# Patient Record
Sex: Female | Born: 1960 | Race: White | Hispanic: No | Marital: Married | Smoking: Never smoker
Health system: Southern US, Community
[De-identification: ages and names within clinical notes are randomized; demographics above are authoritative.]

## PROBLEM LIST (undated history)

## (undated) DIAGNOSIS — M810 Age-related osteoporosis without current pathological fracture: Secondary | ICD-10-CM

## (undated) DIAGNOSIS — IMO0002 Reserved for concepts with insufficient information to code with codable children: Secondary | ICD-10-CM

## (undated) DIAGNOSIS — E7800 Pure hypercholesterolemia, unspecified: Secondary | ICD-10-CM

## (undated) DIAGNOSIS — F429 Obsessive-compulsive disorder, unspecified: Secondary | ICD-10-CM

## (undated) DIAGNOSIS — G43909 Migraine, unspecified, not intractable, without status migrainosus: Secondary | ICD-10-CM

## (undated) DIAGNOSIS — E039 Hypothyroidism, unspecified: Secondary | ICD-10-CM

## (undated) DIAGNOSIS — K068 Other specified disorders of gingiva and edentulous alveolar ridge: Secondary | ICD-10-CM

## (undated) HISTORY — DX: Migraine, unspecified, not intractable, without status migrainosus: G43.909

## (undated) HISTORY — DX: Obsessive-compulsive disorder, unspecified: F42.9

## (undated) HISTORY — PX: BREAST BIOPSY: SHX20

## (undated) HISTORY — DX: Other specified disorders of gingiva and edentulous alveolar ridge: K06.8

## (undated) HISTORY — DX: Age-related osteoporosis without current pathological fracture: M81.0

## (undated) HISTORY — DX: Pure hypercholesterolemia, unspecified: E78.00

## (undated) HISTORY — DX: Hypothyroidism, unspecified: E03.9

## (undated) HISTORY — DX: Reserved for concepts with insufficient information to code with codable children: IMO0002

## (undated) HISTORY — PX: OTHER SURGICAL HISTORY: SHX169

---

## 1999-11-12 HISTORY — PX: BREAST LUMPECTOMY: SHX2

## 2000-10-21 ENCOUNTER — Other Ambulatory Visit: Admission: RE | Admit: 2000-10-21 | Discharge: 2000-10-21 | Payer: Self-pay | Admitting: Obstetrics and Gynecology

## 2001-10-27 ENCOUNTER — Other Ambulatory Visit: Admission: RE | Admit: 2001-10-27 | Discharge: 2001-10-27 | Payer: Self-pay | Admitting: Radiology

## 2001-11-19 ENCOUNTER — Ambulatory Visit (HOSPITAL_COMMUNITY): Admission: RE | Admit: 2001-11-19 | Discharge: 2001-11-19 | Payer: Self-pay | Admitting: Surgery

## 2001-11-19 ENCOUNTER — Encounter (INDEPENDENT_AMBULATORY_CARE_PROVIDER_SITE_OTHER): Payer: Self-pay | Admitting: *Deleted

## 2002-11-01 ENCOUNTER — Other Ambulatory Visit: Admission: RE | Admit: 2002-11-01 | Discharge: 2002-11-01 | Payer: Self-pay | Admitting: Obstetrics and Gynecology

## 2003-11-08 ENCOUNTER — Other Ambulatory Visit: Admission: RE | Admit: 2003-11-08 | Discharge: 2003-11-08 | Payer: Self-pay | Admitting: Obstetrics and Gynecology

## 2003-11-12 HISTORY — PX: CATARACT EXTRACTION: SUR2

## 2004-10-15 ENCOUNTER — Other Ambulatory Visit: Admission: RE | Admit: 2004-10-15 | Discharge: 2004-10-15 | Payer: Self-pay | Admitting: Obstetrics and Gynecology

## 2005-10-18 ENCOUNTER — Other Ambulatory Visit: Admission: RE | Admit: 2005-10-18 | Discharge: 2005-10-18 | Payer: Self-pay | Admitting: Obstetrics and Gynecology

## 2006-10-20 ENCOUNTER — Other Ambulatory Visit: Admission: RE | Admit: 2006-10-20 | Discharge: 2006-10-20 | Payer: Self-pay | Admitting: Obstetrics and Gynecology

## 2007-10-23 ENCOUNTER — Other Ambulatory Visit: Admission: RE | Admit: 2007-10-23 | Discharge: 2007-10-23 | Payer: Self-pay | Admitting: Obstetrics and Gynecology

## 2008-10-21 ENCOUNTER — Other Ambulatory Visit: Admission: RE | Admit: 2008-10-21 | Discharge: 2008-10-21 | Payer: Self-pay | Admitting: Gynecology

## 2008-10-21 ENCOUNTER — Encounter: Payer: Self-pay | Admitting: Gynecology

## 2008-10-21 ENCOUNTER — Ambulatory Visit: Payer: Self-pay | Admitting: Gynecology

## 2008-11-11 DIAGNOSIS — IMO0002 Reserved for concepts with insufficient information to code with codable children: Secondary | ICD-10-CM

## 2008-11-11 HISTORY — DX: Reserved for concepts with insufficient information to code with codable children: IMO0002

## 2009-04-24 ENCOUNTER — Other Ambulatory Visit: Admission: RE | Admit: 2009-04-24 | Discharge: 2009-04-24 | Payer: Self-pay | Admitting: Gynecology

## 2009-04-24 ENCOUNTER — Ambulatory Visit: Payer: Self-pay | Admitting: Gynecology

## 2009-04-24 ENCOUNTER — Encounter: Payer: Self-pay | Admitting: Gynecology

## 2009-04-28 ENCOUNTER — Ambulatory Visit: Payer: Self-pay | Admitting: Gynecology

## 2009-06-20 ENCOUNTER — Ambulatory Visit: Payer: Self-pay | Admitting: Gynecology

## 2009-10-23 ENCOUNTER — Ambulatory Visit: Payer: Self-pay | Admitting: Gynecology

## 2009-10-23 ENCOUNTER — Other Ambulatory Visit: Admission: RE | Admit: 2009-10-23 | Discharge: 2009-10-23 | Payer: Self-pay | Admitting: Gynecology

## 2009-11-07 ENCOUNTER — Ambulatory Visit: Payer: Self-pay | Admitting: Gynecology

## 2009-11-21 ENCOUNTER — Other Ambulatory Visit: Admission: RE | Admit: 2009-11-21 | Discharge: 2009-11-21 | Payer: Self-pay | Admitting: Gynecologic Oncology

## 2009-11-21 ENCOUNTER — Ambulatory Visit: Admission: RE | Admit: 2009-11-21 | Discharge: 2009-11-21 | Payer: Self-pay | Admitting: Gynecologic Oncology

## 2010-02-07 ENCOUNTER — Other Ambulatory Visit: Admission: RE | Admit: 2010-02-07 | Discharge: 2010-02-07 | Payer: Self-pay | Admitting: Gynecology

## 2010-02-07 ENCOUNTER — Ambulatory Visit: Payer: Self-pay | Admitting: Gynecology

## 2010-10-24 ENCOUNTER — Ambulatory Visit: Payer: Self-pay | Admitting: Gynecology

## 2010-10-24 ENCOUNTER — Other Ambulatory Visit
Admission: RE | Admit: 2010-10-24 | Discharge: 2010-10-24 | Payer: Self-pay | Source: Home / Self Care | Admitting: Gynecology

## 2011-01-21 ENCOUNTER — Other Ambulatory Visit (INDEPENDENT_AMBULATORY_CARE_PROVIDER_SITE_OTHER): Payer: BC Managed Care – PPO

## 2011-01-21 DIAGNOSIS — E559 Vitamin D deficiency, unspecified: Secondary | ICD-10-CM

## 2011-02-08 ENCOUNTER — Other Ambulatory Visit: Payer: Self-pay

## 2011-03-29 NOTE — Op Note (Signed)
[place]  Patient:    Ashlee Chavez, Ashlee Chavez Visit Number: [id number] MRN: [id number]          Service Type: DSU Location: [facility] 2899 [id number] Attending Physician:  Andre Lefort Dictated by:   Sandria Bales. Ezzard Standing, M.D. Proc. Date: 11/19/01 Admit Date:  11/19/2001   CC:         Ashlee Maduro A. Eliezer Lofts., M.D.  Ashlee Chavez, M.D.   Operative Report  DATE OF BIRTH:  September 25, 1961.  PREOPERATIVE DIAGNOSIS:  Mass, right breast.  POSTOPERATIVE DIAGNOSIS:  Mass, right breast, final pathology pending.  PROCEDURE:  Right breast excisional biopsy.  SURGEON:  Sandria Bales. Ezzard Standing, M.D.  ANESTHESIA:  Approximately 20 cc of 0.25% Marcaine with MAC anesthesia.  COMPLICATIONS:  None.  INDICATION FOR PROCEDURE;  [name] is a 50 year old white female patient of Dr. Sylvester Harder and Dr. Elias Else, who has noticed a mass in her right breast.  She has had an aspiration of this once, when it essentially disappeared, but then reappeared fairly quickly.  A second aspiration showed papillary fragments consistent with possible papilloma.  She has a palpable mass at the 7 oclock position of the right breast, which by ultrasound is partly cystic and partly solid, and now comes for excision of this mass.  DESCRIPTION OF PROCEDURE:  The patient was taken to the operating room in a supine position.  I used an ultrasound at the initiation of the procedure to identify the mass, which was about 1.5 cm in greatest diameter and appears to have a partly cystic and partly solid component.  I marked the breast and painted it with Betadine solution and then used 1% Xylocaine with epinephrine as a local anesthetic.  I excised a block of breast tissue approximately 2.5 x 2.5 cm and got around the mass.  This was sent for permanent pathology.  There was no other palpable mass or abnormality within the breast.  I controlled bleeding with Bovie cautery and 3-0 Vicryl  sutures, closed the subcutaneous tissues with 3-0 Vicryl sutures and the skin with a 5-0 subcuticular Vicryl suture, painted with tincture of benzoin and Steri-Strips, and sterilely dressed.  The patient tolerated the procedure well, will see me back in seven to 10 days for follow-up.  She knows to call in about three days for final pathology. Dictated by:   Sandria Bales. Ezzard Standing, M.D. Attending Physician:  Andre Lefort DD:  11/19/01 TD:  11/19/01 Job: [id number]

## 2011-04-30 ENCOUNTER — Ambulatory Visit (INDEPENDENT_AMBULATORY_CARE_PROVIDER_SITE_OTHER): Payer: BC Managed Care – PPO | Admitting: Gynecology

## 2011-04-30 ENCOUNTER — Other Ambulatory Visit: Payer: Self-pay | Admitting: Gynecology

## 2011-04-30 ENCOUNTER — Other Ambulatory Visit (HOSPITAL_COMMUNITY)
Admission: RE | Admit: 2011-04-30 | Discharge: 2011-04-30 | Disposition: A | Discharge status: Home / Self Care | Payer: BC Managed Care – PPO | Source: Ambulatory Visit | Attending: Gynecology | Admitting: Gynecology

## 2011-04-30 DIAGNOSIS — N926 Irregular menstruation, unspecified: Secondary | ICD-10-CM

## 2011-04-30 DIAGNOSIS — R87619 Unspecified abnormal cytological findings in specimens from cervix uteri: Secondary | ICD-10-CM | POA: Insufficient documentation

## 2011-04-30 DIAGNOSIS — N870 Mild cervical dysplasia: Secondary | ICD-10-CM

## 2011-10-25 ENCOUNTER — Encounter: Payer: Self-pay | Admitting: Gynecology

## 2011-10-29 ENCOUNTER — Ambulatory Visit (INDEPENDENT_AMBULATORY_CARE_PROVIDER_SITE_OTHER): Payer: BC Managed Care – PPO | Admitting: Gynecology

## 2011-10-29 ENCOUNTER — Encounter: Payer: Self-pay | Admitting: Gynecology

## 2011-10-29 VITALS — BP 98/60 | Ht 64.0 in | Wt 120.0 lb

## 2011-10-29 DIAGNOSIS — Z01419 Encounter for gynecological examination (general) (routine) without abnormal findings: Secondary | ICD-10-CM

## 2011-10-29 DIAGNOSIS — R87612 Low grade squamous intraepithelial lesion on cytologic smear of cervix (LGSIL): Secondary | ICD-10-CM

## 2011-10-29 DIAGNOSIS — IMO0002 Reserved for concepts with insufficient information to code with codable children: Secondary | ICD-10-CM

## 2011-10-29 DIAGNOSIS — Z780 Asymptomatic menopausal state: Secondary | ICD-10-CM

## 2011-10-29 NOTE — Patient Instructions (Addendum)
Continued annual follow up with Pap smears.  Follow up for DEXA bone density as scheduled.

## 2011-10-29 NOTE — Progress Notes (Signed)
Ashlee Chavez 10/03/1961 [id number]        50 y.o.  for annual exam.  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 Was seen by Dr. Nelly Rout in consultation with recommended serial cytologies. Her dysplasia was December 2010 she has 3 follow up Pap smears which have been normal.  Past medical history,surgical history, medications, allergies, family history and social history were all reviewed and documented in the EPIC chart. ROS:  Was performed and pertinent positives and negatives are included in the history.  Exam: chaperone present Filed Vitals:   10/29/11 1536  BP: 98/60   General appearance  Normal Skin grossly normal Head/Neck normal with no cervical or supraclavicular adenopathy thyroid normal Lungs  clear Cardiac RR, without RMG Abdominal  soft, nontender, without masses, organomegaly or hernia Breasts  examined lying and sitting without masses, retractions, discharge or axillary adenopathy. Pelvic  Ext/BUS/vagina  normal with atrophic genital changes noted and constriction band upper vagina  Cervix  normal  To limited inspection. Pap done  Uterus  anteverted, normal size, shape and contour, midline and mobile nontender   Adnexa  Without masses or tenderness    Anus and perineum  normal   Rectovaginal  normal sphincter tone without palpated masses or tenderness. Old external hemorrhoid   Assessment/Plan:  50 y.o. female for annual exam.    1. 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 Pap done today we'll continued annual cytology. 2. Menopausal symptoms. Patient's having some mild night sweats not overly bothersome. I recommended OTC soy. If worsens then will represent for HRT discussion. Patient's not interested at this point. 3. Amenorrhea. Patient is going on one year without menses. Will report any further bleeding. 4. Bone health. We'll get baseline bone density now. She is  thin now postmenopausal we'll go and get baseline. Increase calcium vitamin D reviewed. 5. Breast health. SBE monthly reviewed. Patient had mammogram last week we'll continue with annual mammography. 6. Colonoscopy. Patient had colonoscopy this past year we'll follow up with the recommended interval. 7. Health maintenance. No blood work was done today as this is all done through her primary physician's office who she sees actively. Assuming she continues well from a gynecologic standpoint she will see [place] in a year sooner as needed.    Dara Lords MD, 4:11 PM 10/29/2011

## 2011-11-06 ENCOUNTER — Other Ambulatory Visit (HOSPITAL_COMMUNITY)
Admission: RE | Admit: 2011-11-06 | Discharge: 2011-11-06 | Disposition: A | Discharge status: Home / Self Care | Payer: BC Managed Care – PPO | Source: Ambulatory Visit | Attending: Gynecology | Admitting: Gynecology

## 2011-11-12 DIAGNOSIS — M8580 Other specified disorders of bone density and structure, unspecified site: Secondary | ICD-10-CM | POA: Insufficient documentation

## 2011-11-13 ENCOUNTER — Other Ambulatory Visit: Payer: Self-pay | Admitting: Endocrinology

## 2011-11-13 DIAGNOSIS — E049 Nontoxic goiter, unspecified: Secondary | ICD-10-CM

## 2011-11-20 ENCOUNTER — Ambulatory Visit
Admission: RE | Admit: 2011-11-20 | Discharge: 2011-11-20 | Disposition: A | Discharge status: Home / Self Care | Payer: BC Managed Care – PPO | Source: Ambulatory Visit | Attending: Endocrinology | Admitting: Endocrinology

## 2011-11-20 DIAGNOSIS — E049 Nontoxic goiter, unspecified: Secondary | ICD-10-CM

## 2011-12-05 ENCOUNTER — Encounter: Payer: Self-pay | Admitting: Gynecology

## 2011-12-05 ENCOUNTER — Telehealth: Payer: Self-pay | Admitting: Gynecology

## 2011-12-05 ENCOUNTER — Ambulatory Visit (INDEPENDENT_AMBULATORY_CARE_PROVIDER_SITE_OTHER): Payer: BC Managed Care – PPO

## 2011-12-05 DIAGNOSIS — M8580 Other specified disorders of bone density and structure, unspecified site: Secondary | ICD-10-CM

## 2011-12-05 DIAGNOSIS — M899 Disorder of bone, unspecified: Secondary | ICD-10-CM

## 2011-12-05 DIAGNOSIS — Z780 Asymptomatic menopausal state: Secondary | ICD-10-CM

## 2011-12-05 NOTE — Telephone Encounter (Signed)
Left message for pt to call.

## 2011-12-05 NOTE — Telephone Encounter (Signed)
Tell patient that bone density shows osteopenia with some loss. Recommend maximizing calcium 1200 mg daily and vitamin D at 1000 units daily and we will recheck a bone density in 2 years.

## 2011-12-09 NOTE — Telephone Encounter (Signed)
Pt informed with the below note. 

## 2012-10-26 ENCOUNTER — Encounter: Payer: Self-pay | Admitting: Gynecology

## 2012-10-29 ENCOUNTER — Other Ambulatory Visit (HOSPITAL_COMMUNITY)
Admission: RE | Admit: 2012-10-29 | Discharge: 2012-10-29 | Disposition: A | Discharge status: Home / Self Care | Payer: BC Managed Care – PPO | Source: Ambulatory Visit | Attending: Gynecology | Admitting: Gynecology

## 2012-10-29 ENCOUNTER — Ambulatory Visit (INDEPENDENT_AMBULATORY_CARE_PROVIDER_SITE_OTHER): Payer: BC Managed Care – PPO | Admitting: Gynecology

## 2012-10-29 ENCOUNTER — Encounter: Payer: Self-pay | Admitting: Gynecology

## 2012-10-29 VITALS — BP 110/70 | Ht 63.0 in | Wt 114.0 lb

## 2012-10-29 DIAGNOSIS — Z01419 Encounter for gynecological examination (general) (routine) without abnormal findings: Secondary | ICD-10-CM | POA: Insufficient documentation

## 2012-10-29 DIAGNOSIS — M8580 Other specified disorders of bone density and structure, unspecified site: Secondary | ICD-10-CM

## 2012-10-29 DIAGNOSIS — F429 Obsessive-compulsive disorder, unspecified: Secondary | ICD-10-CM | POA: Insufficient documentation

## 2012-10-29 DIAGNOSIS — E039 Hypothyroidism, unspecified: Secondary | ICD-10-CM | POA: Insufficient documentation

## 2012-10-29 DIAGNOSIS — Z1151 Encounter for screening for human papillomavirus (HPV): Secondary | ICD-10-CM | POA: Insufficient documentation

## 2012-10-29 DIAGNOSIS — E7800 Pure hypercholesterolemia, unspecified: Secondary | ICD-10-CM | POA: Insufficient documentation

## 2012-10-29 DIAGNOSIS — M899 Disorder of bone, unspecified: Secondary | ICD-10-CM

## 2012-10-29 DIAGNOSIS — I10 Essential (primary) hypertension: Secondary | ICD-10-CM | POA: Insufficient documentation

## 2012-10-29 DIAGNOSIS — IMO0002 Reserved for concepts with insufficient information to code with codable children: Secondary | ICD-10-CM | POA: Insufficient documentation

## 2012-10-29 NOTE — Addendum Note (Signed)
Addended by: Dayna Barker on: 10/29/2012 12:47 PM   Modules accepted: Orders

## 2012-10-29 NOTE — Progress Notes (Signed)
ESSENCE MERLE 07-06-1961 [id number]        51 y.o.  G0P0 for annual exam.    Past medical history,surgical history, medications, allergies, family history and social history were all reviewed and documented in the EPIC chart. ROS:  Was performed and pertinent positives and negatives are included in the history.  Exam: Kim assistant Filed Vitals:   10/29/12 1123  BP: 110/70  Height: 5\' 3"  (1.6 m)  Weight: 114 lb (51.71 kg)   General appearance  Normal Skin grossly normal Head/Neck normal with no cervical or supraclavicular adenopathy thyroid normal Lungs  clear Cardiac RR, without RMG Abdominal  soft, nontender, without masses, organomegaly or hernia Breasts  examined lying and sitting without masses, retractions, discharge or axillary adenopathy. Pelvic  Ext/BUS/vagina atrophic with upper vaginal constriction ring before the cervix.  Cervix  normal Pap/HPV  Uterus  axial, normal size, shape and contour, midline and mobile nontender   Adnexa  Without masses or tenderness    Anus and perineum  normal   Rectovaginal  normal sphincter tone without palpated masses or tenderness.    Assessment/Plan:  51 y.o. G0P0 female for annual exam.   1. Postmenopausal. Patient without significant symptoms or any bleeding. We'll continue to monitor.  Knows to report any bleeding. 2. Pap smear 2012. Pap/HPV done today. Does have history of low-grade SIL 2010 with multiple normal Pap smears since.  Cervix is hard to visualize due to the constriction ring and she saw Dr. Laurette Schimke GYN oncologist in referral due to the low-grade abnormal Pap smear and difficulty with colposcopy. She recommended expected management as long as her Pap smears remain normal then we'll follow expectantly. 3. Mammography normal 3 days ago.  Continue with annual mammography. SBE monthly reviewed. 4. Osteopenia. DEXA on/2013 T score -1.9.  FRAX 5.5%/0.7%. Continue with calcium vitamin D supplementation and planned repeat  DEXA next year at two-year interval. 5. Colonoscopy 2012. Follow up at recommended interval. 6. Health maintenance. No blood work done as it is all done through her primary physician's office who she sees on a regular basis. Follow up one year, sooner as needed.    Dara Lords MD, 12:18 PM 10/29/2012

## 2012-10-29 NOTE — Patient Instructions (Signed)
Follow up for annual exam in one year 

## 2012-10-30 ENCOUNTER — Other Ambulatory Visit: Payer: Self-pay | Admitting: *Deleted

## 2012-10-30 DIAGNOSIS — R809 Proteinuria, unspecified: Secondary | ICD-10-CM

## 2012-10-30 LAB — URINALYSIS W MICROSCOPIC + REFLEX CULTURE
Bacteria, UA: NONE SEEN
Bilirubin Urine: NEGATIVE
Hgb urine dipstick: NEGATIVE
Ketones, ur: NEGATIVE mg/dL
Nitrite: NEGATIVE
Protein, ur: 100 mg/dL — AB
RBC / HPF: NONE SEEN RBC/hpf (ref <3)
Specific Gravity, Urine: 1.01 (ref 1.005–1.030)
Urobilinogen, UA: 0.2 mg/dL (ref 0.0–1.0)

## 2012-11-02 ENCOUNTER — Other Ambulatory Visit: Payer: BC Managed Care – PPO

## 2012-11-02 DIAGNOSIS — R809 Proteinuria, unspecified: Secondary | ICD-10-CM

## 2012-11-03 LAB — URINALYSIS W MICROSCOPIC + REFLEX CULTURE
Bacteria, UA: NONE SEEN
Casts: NONE SEEN
Crystals: NONE SEEN
Ketones, ur: NEGATIVE mg/dL
Leukocytes, UA: NEGATIVE
Nitrite: NEGATIVE
Specific Gravity, Urine: 1.005 (ref 1.005–1.030)
Urobilinogen, UA: 0.2 mg/dL (ref 0.0–1.0)
pH: 6 (ref 5.0–8.0)

## 2013-07-17 ENCOUNTER — Emergency Department (HOSPITAL_COMMUNITY): Payer: BC Managed Care – PPO

## 2013-07-17 ENCOUNTER — Emergency Department (HOSPITAL_COMMUNITY)
Admission: EM | Admit: 2013-07-17 | Discharge: 2013-07-17 | Disposition: A | Discharge status: Home / Self Care | Payer: BC Managed Care – PPO | Attending: Emergency Medicine | Admitting: Emergency Medicine

## 2013-07-17 DIAGNOSIS — Z79899 Other long term (current) drug therapy: Secondary | ICD-10-CM | POA: Insufficient documentation

## 2013-07-17 DIAGNOSIS — E039 Hypothyroidism, unspecified: Secondary | ICD-10-CM | POA: Insufficient documentation

## 2013-07-17 DIAGNOSIS — E7800 Pure hypercholesterolemia, unspecified: Secondary | ICD-10-CM | POA: Insufficient documentation

## 2013-07-17 DIAGNOSIS — Z8739 Personal history of other diseases of the musculoskeletal system and connective tissue: Secondary | ICD-10-CM | POA: Insufficient documentation

## 2013-07-17 DIAGNOSIS — G43909 Migraine, unspecified, not intractable, without status migrainosus: Secondary | ICD-10-CM | POA: Insufficient documentation

## 2013-07-17 DIAGNOSIS — F429 Obsessive-compulsive disorder, unspecified: Secondary | ICD-10-CM | POA: Insufficient documentation

## 2013-07-17 DIAGNOSIS — I10 Essential (primary) hypertension: Secondary | ICD-10-CM | POA: Insufficient documentation

## 2013-07-17 DIAGNOSIS — N201 Calculus of ureter: Secondary | ICD-10-CM | POA: Insufficient documentation

## 2013-07-17 DIAGNOSIS — R1110 Vomiting, unspecified: Secondary | ICD-10-CM | POA: Insufficient documentation

## 2013-07-17 LAB — URINALYSIS, ROUTINE W REFLEX MICROSCOPIC
Bilirubin Urine: NEGATIVE
Glucose, UA: NEGATIVE mg/dL
Ketones, ur: NEGATIVE mg/dL
Leukocytes, UA: NEGATIVE
Nitrite: NEGATIVE
Specific Gravity, Urine: 1.014 (ref 1.005–1.030)
pH: 7.5 (ref 5.0–8.0)

## 2013-07-17 LAB — URINE MICROSCOPIC-ADD ON

## 2013-07-17 LAB — POCT I-STAT, CHEM 8
Chloride: 109 mEq/L (ref 96–112)
HCT: 42 % (ref 36.0–46.0)
HCT: 41 % (ref 36.0–46.0)
Hemoglobin: 14.3 g/dL (ref 12.0–15.0)
Potassium: 3.9 mEq/L (ref 3.5–5.1)
Potassium: 3.8 mEq/L (ref 3.5–5.1)
Sodium: 143 mEq/L (ref 135–145)
Sodium: 144 mEq/L (ref 135–145)

## 2013-07-17 MED ORDER — KETOROLAC TROMETHAMINE 30 MG/ML IJ SOLN
30.0000 mg | Freq: Once | INTRAMUSCULAR | Status: DC
  Filled 2013-07-17: qty 1

## 2013-07-17 MED ORDER — OXYCODONE-ACETAMINOPHEN 5-325 MG PO TABS: 1.0000 | ORAL_TABLET | ORAL | Status: DC | PRN

## 2013-07-17 MED ORDER — TAMSULOSIN HCL 0.4 MG PO CAPS: 0.4000 mg | ORAL_CAPSULE | Freq: Every day | ORAL | Status: DC

## 2013-07-17 NOTE — ED Notes (Signed)
Pt unable to urinate at this time, instructed to notify when able.

## 2013-07-17 NOTE — ED Provider Notes (Signed)
CSN: [id number]     Arrival date & time 07/17/13  1123 History   First MD Initiated Contact with Patient 07/17/13 1123     Chief Complaint  Patient presents with  . Abdominal Pain  . Emesis   (Consider location/radiation/quality/duration/timing/severity/associated sxs/prior Treatment) HPI Comments: Pt states that she had acute onset of right flank pain radiating to her right lower abdomen:also having some vomiting:denies dysuria, vaginal discharge, diarrhea or fever;denies history of similar symptoms:pt states that the pain is better after the medication from ems  No language interpreter was used.    Past Medical History  Diagnosis Date  . LGSIL (low grade squamous intraepithelial dysplasia) 2010  . OCD (obsessive compulsive disorder)   . Migraines   . Hypercholesteremia   . Osteopenia 11/2011    T score -1.9  . Hypertension   . Hypothyroid    Past Surgical History  Procedure Laterality Date  . Cataract extraction  2005  . Breast lumpectomy  2001    right-benign   Family History  Problem Relation Age of Onset  . Hypertension Mother   . Heart disease Mother   . Hypertension Father   . Breast cancer Paternal Aunt     Age 81   History  Substance Use Topics  . Smoking status: Never Smoker   . Smokeless tobacco: Never Used  . Alcohol Use: Yes     Comment: wine occassionally   OB History   Grav Para Term Preterm Abortions TAB SAB Ect Mult Living   0              Review of Systems  Constitutional: Negative.   Respiratory: Negative.   Genitourinary: Positive for flank pain. Negative for dysuria.    Allergies  Ampicillin  Home Medications   Current Outpatient Rx  Name  Route  Sig  Dispense  Refill  . ALPRAZolam (XANAX) 0.25 MG tablet   Oral   Take 0.25 mg by mouth at bedtime as needed.         . Ascorbic Acid (VITAMIN C PO)   Oral   Take by mouth.           Marland Kitchen atorvastatin (LIPITOR) 20 MG tablet   Oral   Take 20 mg by mouth daily.           .  Calcium Carbonate-Vitamin D (CALCIUM + D PO)   Oral   Take by mouth.           . Cholecalciferol (VITAMIN D PO)   Oral   Take by mouth.         . Cyanocobalamin (VITAMIN B-12 PO)   Oral   Take by mouth.         . levothyroxine (SYNTHROID, LEVOTHROID) 75 MCG tablet   Oral   Take 75 mcg by mouth daily. Skip on Sunday           . PARoxetine (PAXIL) 20 MG tablet   Oral   Take 80 mg by mouth at bedtime.          . topiramate (TOPAMAX) 100 MG tablet   Oral   Take 100 mg by mouth daily. 150 mg.daily          BP 129/85  Pulse 83  Temp(Src) 98.2 F (36.8 C)  Resp 22  Ht 5\' 4"  (1.626 m)  Wt 115 lb (52.164 kg)  BMI 19.73 kg/m2  SpO2 98%  LMP 11/19/2010 Physical Exam  Nursing note and vitals reviewed. Constitutional: She is  oriented to person, place, and time. She appears well-developed and well-nourished. She appears distressed.  HENT:  Head: Normocephalic and atraumatic.  Eyes: Conjunctivae and EOM are normal.  Neck: Neck supple.  Cardiovascular: Normal rate and regular rhythm.   Pulmonary/Chest: Effort normal and breath sounds normal.  Abdominal: Soft. Bowel sounds are normal. There is no tenderness.  Musculoskeletal: Normal range of motion.  Neurological: She is alert and oriented to person, place, and time.  Skin: Skin is warm and dry.  Psychiatric: She has a normal mood and affect.    ED Course  Procedures (including critical care time) Labs Review Labs Reviewed  URINALYSIS, ROUTINE W REFLEX MICROSCOPIC - Abnormal; Notable for the following:    APPearance CLOUDY (*)    Hgb urine dipstick LARGE (*)    All other components within normal limits  URINE MICROSCOPIC-ADD ON - Abnormal; Notable for the following:    Bacteria, UA FEW (*)    All other components within normal limits   Imaging Review Ct Abdomen Pelvis Wo Contrast  07/17/2013   *RADIOLOGY REPORT*  Clinical Data: Right-sided abdominal pain with nausea and vomiting. Hypertension.  Migraines.   Right lumpectomy.  CT ABDOMEN AND PELVIS WITHOUT CONTRAST  Technique:  Multidetector CT imaging of the abdomen and pelvis was performed following the standard protocol without intravenous contrast.  Comparison: None.  Findings: Lung bases:  Motion degradation at the lung bases.  Mild left base scarring.  Normal heart size.  Trace bilateral pleural effusions.  Abdomen/pelvis:  Normal uninfused appearance of the liver, spleen, stomach, pancreas, gallbladder, biliary tract, adrenal glands.  Bilateral punctate renal collecting system calculi.  Mild to moderate right-sided urinary tract obstruction with hydroureter continuing to the level of a proximal to mid right ureteric calculus which measures 4 mm on image 44/series 2.  No distal ureteric stone.  Advanced aortic atherosclerosis. No retroperitoneal or retrocrural adenopathy.  Normal colon and terminal ileum.  Appendix likely identified on image 49/series 2. Normal small bowel without abdominal ascites.  No pelvic adenopathy.  A punctate calcification along the posterior wall of the bladder on image 73/series 2.  Normal uterus and adnexa, without significant free pelvic fluid.  Bones/Musculoskeletal:  No acute osseous abnormality.  IMPRESSION:  1.  Mild to moderate right-sided urinary tract obstruction secondary to a proximal to mid right ureteric calculus. 2.  Bilateral nephrolithiasis. 3.  Faint calcification along the posterior wall of the urinary bladder.  Suspicious for a tiny bladder stone.  Dystrophic calcification associated with anterior wall of the vagina could look similar. 4.  Trace bilateral pleural effusions.   Original Report Authenticated By: Jeronimo Greaves, M.D.    MDM   1. Ureteral stone    Pt is comfortable at this time will send home with oxycodone and flomax:pt given urology follow up as no history of stone    Teressa Lower, NP 07/17/13 1452

## 2013-07-17 NOTE — ED Notes (Signed)
Pt presents with lower right sided back pain that radiates around to abd with nausea and vomitting. Pt was given 4mf of zofran and of fentayl

## 2013-07-17 NOTE — ED Provider Notes (Signed)
Medical screening examination/treatment/procedure(s) were performed by non-physician practitioner and as supervising physician I was immediately available for consultation/collaboration.   Shelda Jakes, MD 07/17/13 1500

## 2013-09-21 ENCOUNTER — Other Ambulatory Visit (INDEPENDENT_AMBULATORY_CARE_PROVIDER_SITE_OTHER): Payer: BC Managed Care – PPO

## 2013-09-21 ENCOUNTER — Other Ambulatory Visit: Payer: Self-pay | Admitting: Endocrinology

## 2013-09-21 ENCOUNTER — Other Ambulatory Visit: Payer: Self-pay | Admitting: *Deleted

## 2013-09-21 DIAGNOSIS — E039 Hypothyroidism, unspecified: Secondary | ICD-10-CM

## 2013-09-21 DIAGNOSIS — E7800 Pure hypercholesterolemia, unspecified: Secondary | ICD-10-CM

## 2013-09-21 LAB — COMPREHENSIVE METABOLIC PANEL
Alkaline Phosphatase: 48 U/L (ref 39–117)
BUN: 14 mg/dL (ref 6–23)
Calcium: 9.8 mg/dL (ref 8.4–10.5)
GFR: 68.49 mL/min (ref >60.00)
Potassium: 4.1 mEq/L (ref 3.5–5.1)
Sodium: 139 mEq/L (ref 135–145)

## 2013-09-21 LAB — LIPID PANEL
Cholesterol: 172 mg/dL (ref 0–200)
HDL: 75.6 mg/dL (ref >39.00)
VLDL: 15.6 mg/dL (ref 0.0–40.0)

## 2013-09-21 LAB — TSH: TSH: 3.06 u[IU]/mL (ref 0.35–5.50)

## 2013-09-21 LAB — T4, FREE: Free T4: 0.7 ng/dL (ref 0.60–1.60)

## 2013-09-23 ENCOUNTER — Encounter: Payer: Self-pay | Admitting: Endocrinology

## 2013-09-23 ENCOUNTER — Ambulatory Visit (INDEPENDENT_AMBULATORY_CARE_PROVIDER_SITE_OTHER): Payer: BC Managed Care – PPO | Admitting: Endocrinology

## 2013-09-23 VITALS — BP 120/76 | HR 111 | Temp 98.2°F | Resp 12 | Ht 64.0 in | Wt 120.0 lb

## 2013-09-23 DIAGNOSIS — E039 Hypothyroidism, unspecified: Secondary | ICD-10-CM

## 2013-09-23 DIAGNOSIS — E7800 Pure hypercholesterolemia, unspecified: Secondary | ICD-10-CM

## 2013-09-23 NOTE — Progress Notes (Signed)
Reason for Appointment:  Hypothyroidism, followup visit    History of Present Illness:   The hypothyroidism was first diagnosed in ? 1996  She had a goiter diagnosed in about 58.  She had a positive peroxidase antibody and has been on thyroxine since 1996 Her TSH was high in 2006 and she had symptoms of fatigue, cold intolerance and some hair loss Since then has been on 75 mcg of Synthroid Has generally been on a stable dose of thyroxine  Complaints are reported by the patient now are  None, has chronic cold sensitivity                 Compliance with the medical regimen has been  good with taking the tablet in the morning before breakfast.  Labs:  Appointment on 09/21/2013  Component Date Value Range Status  . TSH 09/21/2013 3.06  0.35 - 5.50 uIU/mL Final  . Free T4 09/21/2013 0.70  0.60 - 1.60 ng/dL Final  . Sodium 16/08/9603 139  135 - 145 mEq/L Final  . Potassium 09/21/2013 4.1  3.5 - 5.1 mEq/L Final  . Chloride 09/21/2013 106  96 - 112 mEq/L Final  . CO2 09/21/2013 25  19 - 32 mEq/L Final  . Glucose, Bld 09/21/2013 90  70 - 99 mg/dL Final  . BUN 54/07/8118 14  6 - 23 mg/dL Final  . Creatinine, Ser 09/21/2013 0.9  0.4 - 1.2 mg/dL Final  . Total Bilirubin 09/21/2013 0.6  0.3 - 1.2 mg/dL Final  . Alkaline Phosphatase 09/21/2013 48  39 - 117 U/L Final  . AST 09/21/2013 18  0 - 37 U/L Final  . ALT 09/21/2013 16  0 - 35 U/L Final  . Total Protein 09/21/2013 7.0  6.0 - 8.3 g/dL Final  . Albumin 14/78/2956 4.1  3.5 - 5.2 g/dL Final  . Calcium 21/30/8657 9.8  8.4 - 10.5 mg/dL Final  . GFR 84/69/6295 68.49  >60.00 mL/min Final  . Cholesterol 09/21/2013 172  0 - 200 mg/dL Final   ATP III Classification       Desirable:  < 200 mg/dL               Borderline High:  200 - 239 mg/dL          High:  > = 284 mg/dL  . Triglycerides 09/21/2013 78.0  0.0 - 149.0 mg/dL Final   Normal:  <132 mg/dLBorderline High:  150 - 199 mg/dL  . HDL 09/21/2013 75.60  >39.00 mg/dL Final  . VLDL  44/11/270 15.6  0.0 - 40.0 mg/dL Final  . LDL Cholesterol 09/21/2013 81  0 - 99 mg/dL Final  . Total CHOL/HDL Ratio 09/21/2013 2   Final                  Men          Women1/2 Average Risk     3.4          3.3Average Risk          5.0          4.42X Average Risk          9.6          7.13X Average Risk          15.0          11.0                          Medication  List       This list is accurate as of: 09/23/13  4:23 PM.  Always use your most recent med list.               ABILIFY 2 MG tablet  Generic drug:  ARIPiprazole  Take 1 mg by mouth daily.     ALPRAZolam 0.25 MG tablet  Commonly known as:  XANAX  Take 0.25 mg by mouth daily as needed for anxiety.     atorvastatin 20 MG tablet  Commonly known as:  LIPITOR  Take 20 mg by mouth daily.     Calcium-Vitamin D 600-200 MG-UNIT Caps  Take 2 tablets by mouth daily.     clonazePAM 0.5 MG tablet  Commonly known as:  KLONOPIN     levothyroxine 75 MCG tablet  Commonly known as:  SYNTHROID, LEVOTHROID  Take 75 mcg by mouth daily. Skip on Sunday     oxyCODONE-acetaminophen 5-325 MG per tablet  Commonly known as:  PERCOCET/ROXICET  Take 1-2 tablets by mouth every 4 (four) hours as needed for pain.     PARoxetine 30 MG tablet  Commonly known as:  PAXIL  Take 30 mg by mouth 2 (two) times daily.     ranitidine 75 MG tablet  Commonly known as:  ZANTAC  Take 75 mg by mouth daily as needed for heartburn.     SUMAtriptan 100 MG tablet  Commonly known as:  IMITREX  Take 100 mg by mouth daily as needed for migraine.     tamsulosin 0.4 MG Caps capsule  Commonly known as:  FLOMAX  Take 1 capsule (0.4 mg total) by mouth daily.     topiramate 100 MG tablet  Commonly known as:  TOPAMAX  - Take 50-100 mg by mouth 2 (two) times daily. 50 mg in the morning  - 100 mg at night     vitamin B-12 1000 MCG tablet  Commonly known as:  CYANOCOBALAMIN  Take 1,000 mcg by mouth daily.     vitamin C 500 MG tablet  Commonly known as:   ASCORBIC ACID  Take 500 mg by mouth daily.     zonisamide 50 MG capsule  Commonly known as:  ZONEGRAN        Allergies:  Allergies  Allergen Reactions  . Ampicillin Rash    Past Medical History  Diagnosis Date  . LGSIL (low grade squamous intraepithelial dysplasia) 2010  . OCD (obsessive compulsive disorder)   . Migraines   . Hypercholesteremia   . Osteopenia 11/2011    T score -1.9  . Hypertension   . Hypothyroid     Past Surgical History  Procedure Laterality Date  . Cataract extraction  2005  . Breast lumpectomy  2001    right-benign    Family History  Problem Relation Age of Onset  . Hypertension Mother   . Heart disease Mother   . Hypertension Father   . Breast cancer Paternal Aunt     Age 32    Social History:  reports that she has never smoked. She has never used smokeless tobacco. She reports that she drinks alcohol. She reports that she does not use illicit drugs.  REVIEW Of SYSTEMS:  She has had hypercholesterolemia and also family history of CAD. Has been on Lipitor 20 mg for several years with no side effects  She sees her gynecologist regularly   Examination:   BP 120/76  Pulse 111  Temp(Src) 98.2 F (36.8 C)  Resp 12  Ht 5\' 4"  (1.626 m)  Wt 120 lb (54.432 kg)  BMI 20.59 kg/m2  SpO2 97%  LMP 11/19/2010    GENERAL APPEARANCE: Alert And looks well.    FACE: No puffiness of face or periorbital edema.         NECK:  right lobe of thyroid is just palpable, smooth and slightly firm         NEUROLOGIC EXAM:  biceps reflexes somewhat difficult to elicit but appear to be normal    Assessments   Hypothyroidism with history of goiter, now with stable TSH levels and clinically asymptomatic    Treatment:   Continue same dosage before breakfast daily. Reminded her to avoid taking any calcium or iron supplements with the thyroid supplement.   Followup in one year  Discussed importance of exercise especially with her symptoms of depression  and insomnia  Ashlee Chavez 09/23/2013, 4:23 PM

## 2013-10-05 ENCOUNTER — Telehealth: Payer: Self-pay | Admitting: Endocrinology

## 2013-10-05 ENCOUNTER — Other Ambulatory Visit: Payer: Self-pay | Admitting: *Deleted

## 2013-10-05 MED ORDER — LEVOTHYROXINE SODIUM 75 MCG PO TABS: 75.0000 ug | ORAL_TABLET | Freq: Every day | ORAL | Status: DC

## 2013-11-01 ENCOUNTER — Other Ambulatory Visit: Payer: Self-pay | Admitting: *Deleted

## 2013-11-01 MED ORDER — ATORVASTATIN CALCIUM 20 MG PO TABS: 20.0000 mg | ORAL_TABLET | Freq: Every day | ORAL | Status: DC

## 2013-11-01 MED ORDER — LEVOTHYROXINE SODIUM 75 MCG PO TABS: 75.0000 ug | ORAL_TABLET | Freq: Every day | ORAL | Status: DC

## 2013-11-08 ENCOUNTER — Encounter: Payer: Self-pay | Admitting: Gynecology

## 2013-11-16 ENCOUNTER — Ambulatory Visit (INDEPENDENT_AMBULATORY_CARE_PROVIDER_SITE_OTHER): Payer: BC Managed Care – PPO | Admitting: Gynecology

## 2013-11-16 ENCOUNTER — Encounter: Payer: Self-pay | Admitting: Gynecology

## 2013-11-16 VITALS — BP 116/74 | Ht 63.0 in | Wt 120.0 lb

## 2013-11-16 DIAGNOSIS — M949 Disorder of cartilage, unspecified: Secondary | ICD-10-CM

## 2013-11-16 DIAGNOSIS — M8580 Other specified disorders of bone density and structure, unspecified site: Secondary | ICD-10-CM

## 2013-11-16 DIAGNOSIS — M899 Disorder of bone, unspecified: Secondary | ICD-10-CM

## 2013-11-16 DIAGNOSIS — Z01419 Encounter for gynecological examination (general) (routine) without abnormal findings: Secondary | ICD-10-CM

## 2013-11-16 DIAGNOSIS — N952 Postmenopausal atrophic vaginitis: Secondary | ICD-10-CM

## 2013-11-16 NOTE — Patient Instructions (Signed)
Followup for bone density as scheduled, otherwise in one year for annual exam.

## 2013-11-16 NOTE — Progress Notes (Signed)
Ashlee HashimotoJanet C Chavez 10/27/1961 [id number]        53 y.o.  G0P0 for annual exam.  Doing well without complaints.  Past medical history,surgical history, problem list, medications, allergies, family history and social history were all reviewed and documented in the EPIC chart.  ROS:  Performed and pertinent positives and negatives are included in the history, assessment and plan .  Exam: Kim assistant Filed Vitals:   11/16/13 1549  BP: 116/74  Height: 5\' 3"  (1.6 m)  Weight: 120 lb (54.432 kg)   General appearance  Normal Skin grossly normal Head/Neck normal with no cervical or supraclavicular adenopathy thyroid normal Lungs  clear Cardiac RR, without RMG Abdominal  soft, nontender, without masses, organomegaly or hernia Breasts  examined lying and sitting without masses, retractions, discharge or axillary adenopathy. Pelvic  Ext/BUS/vagina  general atrophic changes with constriction band upper vagina before Cerebyx.  Cervix  normal and atrophic in appearance  Uterus  axial, normal size, shape and contour, midline and mobile nontender   Adnexa  Without masses or tenderness    Anus and perineum  Normal   Rectovaginal  Normal sphincter tone without palpated masses or tenderness.    Assessment/Plan:  53 y.o. G0P0 female for annual exam.   1. Postmenopausal/atrophic genital changes. Patient without symptoms of hot flushes, night sweats, vaginal dryness. Is not sexually active. No vaginal bleeding. Will continue to monitor. Knows to call with any vaginal bleeding. 2. Pap smear/HPV 2013. No Pap smear done today. History of LGSIL 2010. Normal Pap smears since then. Will continue to monitor with less frequent screening interval. 3. Osteopenia. DEXA 11/2011 with T score -1.9 FRAX 5.5%/0.7%. Repeat DEXA now a 2 year interval. Increase calcium vitamin D reviewed. 4. Mammography 10/2013. Continue with annual mammography. SBE monthly reviewed. 5. Colonoscopy 2012. Repeat at their recommended  interval. 6. Health maintenance. No blood work done as this is all done through her primary physician's office. Followup for DEXA otherwise one year, sooner as needed.  Note: This document was prepared with digital dictation and possible smart phrase technology. Any transcriptional errors that result from this process are unintentional.   Dara LordsFONTAINE,TIMOTHY P MD, 5:20 PM 11/16/2013

## 2013-11-17 LAB — URINALYSIS W MICROSCOPIC + REFLEX CULTURE
BACTERIA UA: NONE SEEN
Bilirubin Urine: NEGATIVE
CRYSTALS: NONE SEEN
Casts: NONE SEEN
Glucose, UA: NEGATIVE mg/dL
Hgb urine dipstick: NEGATIVE
KETONES UR: NEGATIVE mg/dL
Leukocytes, UA: NEGATIVE
NITRITE: NEGATIVE
Protein, ur: NEGATIVE mg/dL
SPECIFIC GRAVITY, URINE: 1.014 (ref 1.005–1.030)
Squamous Epithelial / LPF: NONE SEEN
UROBILINOGEN UA: 0.2 mg/dL (ref 0.0–1.0)
pH: 6 (ref 5.0–8.0)

## 2014-01-04 ENCOUNTER — Ambulatory Visit (INDEPENDENT_AMBULATORY_CARE_PROVIDER_SITE_OTHER): Payer: BC Managed Care – PPO

## 2014-01-04 DIAGNOSIS — M949 Disorder of cartilage, unspecified: Secondary | ICD-10-CM

## 2014-01-04 DIAGNOSIS — M8580 Other specified disorders of bone density and structure, unspecified site: Secondary | ICD-10-CM

## 2014-01-04 DIAGNOSIS — M899 Disorder of bone, unspecified: Secondary | ICD-10-CM

## 2014-01-05 ENCOUNTER — Encounter: Payer: Self-pay | Admitting: Gynecology

## 2014-01-12 ENCOUNTER — Other Ambulatory Visit: Payer: Self-pay | Admitting: *Deleted

## 2014-01-12 DIAGNOSIS — M8580 Other specified disorders of bone density and structure, unspecified site: Secondary | ICD-10-CM

## 2014-01-14 ENCOUNTER — Other Ambulatory Visit: Payer: BC Managed Care – PPO

## 2014-01-14 DIAGNOSIS — M8580 Other specified disorders of bone density and structure, unspecified site: Secondary | ICD-10-CM

## 2014-01-14 LAB — CALCIUM: CALCIUM: 9.4 mg/dL (ref 8.4–10.5)

## 2014-01-15 LAB — VITAMIN D 25 HYDROXY (VIT D DEFICIENCY, FRACTURES): Vit D, 25-Hydroxy: 46 ng/mL (ref 30–89)

## 2014-01-15 LAB — TSH: TSH: 5.524 u[IU]/mL — AB (ref 0.350–4.500)

## 2014-01-25 ENCOUNTER — Telehealth: Payer: Self-pay | Admitting: Endocrinology

## 2014-01-25 NOTE — Telephone Encounter (Signed)
Her thyroid level was mildly abnormal from gynecologist, need to know if she is having more fatigue. If she is feeling fairly good will need to keep her on the same doses and see her in 2 months for thyroid levels are in the office visit

## 2014-01-26 NOTE — Telephone Encounter (Signed)
Left message to return call 

## 2014-01-26 NOTE — Telephone Encounter (Signed)
Returning Rhonda's call.

## 2014-02-03 ENCOUNTER — Other Ambulatory Visit: Payer: Self-pay | Admitting: *Deleted

## 2014-02-03 DIAGNOSIS — E039 Hypothyroidism, unspecified: Secondary | ICD-10-CM

## 2014-02-03 MED ORDER — LEVOTHYROXINE SODIUM 75 MCG PO TABS: 75.0000 ug | ORAL_TABLET | Freq: Every day | ORAL | Status: DC

## 2014-02-03 NOTE — Telephone Encounter (Signed)
Increase Synthroid to 88 mcg and order TSH and free T4 and followup visit in 6 weeks

## 2014-02-03 NOTE — Telephone Encounter (Signed)
Since she was taking 75 mcg 6 days a week will increase the dose to 7 days a week

## 2014-02-03 NOTE — Telephone Encounter (Signed)
Please see messages below and advise. 

## 2014-02-03 NOTE — Telephone Encounter (Signed)
Pt is returning Rhonda's call. Regarding lab results she is definitely feeling fatigue.

## 2014-02-03 NOTE — Telephone Encounter (Signed)
rx sent in patient given instructions, appts made for May 6th and 7th.

## 2014-03-16 ENCOUNTER — Other Ambulatory Visit (INDEPENDENT_AMBULATORY_CARE_PROVIDER_SITE_OTHER): Payer: BC Managed Care – PPO

## 2014-03-16 DIAGNOSIS — E039 Hypothyroidism, unspecified: Secondary | ICD-10-CM

## 2014-03-16 DIAGNOSIS — E7800 Pure hypercholesterolemia, unspecified: Secondary | ICD-10-CM

## 2014-03-16 LAB — COMPREHENSIVE METABOLIC PANEL
ALBUMIN: 4.4 g/dL (ref 3.5–5.2)
ALK PHOS: 46 U/L (ref 39–117)
ALT: 20 U/L (ref 0–35)
AST: 22 U/L (ref 0–37)
BUN: 18 mg/dL (ref 6–23)
CALCIUM: 9.8 mg/dL (ref 8.4–10.5)
CO2: 23 meq/L (ref 19–32)
Chloride: 108 mEq/L (ref 96–112)
Creatinine, Ser: 1.1 mg/dL (ref 0.4–1.2)
GFR: 56.11 mL/min — AB (ref >60.00)
GLUCOSE: 83 mg/dL (ref 70–99)
Potassium: 4 mEq/L (ref 3.5–5.1)
Sodium: 139 mEq/L (ref 135–145)
Total Bilirubin: 0.2 mg/dL (ref 0.2–1.2)
Total Protein: 7.4 g/dL (ref 6.0–8.3)

## 2014-03-16 LAB — LIPID PANEL
CHOLESTEROL: 180 mg/dL (ref 0–200)
HDL: 75.1 mg/dL (ref >39.00)
LDL CALC: 92 mg/dL (ref 0–99)
TRIGLYCERIDES: 66 mg/dL (ref 0.0–149.0)
Total CHOL/HDL Ratio: 2
VLDL: 13.2 mg/dL (ref 0.0–40.0)

## 2014-03-16 LAB — TSH: TSH: 3.37 u[IU]/mL (ref 0.35–4.50)

## 2014-03-16 LAB — T4, FREE: Free T4: 0.69 ng/dL (ref 0.60–1.60)

## 2014-03-17 ENCOUNTER — Encounter: Payer: Self-pay | Admitting: Endocrinology

## 2014-03-17 ENCOUNTER — Ambulatory Visit (INDEPENDENT_AMBULATORY_CARE_PROVIDER_SITE_OTHER): Payer: BC Managed Care – PPO | Admitting: Endocrinology

## 2014-03-17 VITALS — BP 124/72 | HR 96 | Temp 97.9°F | Resp 14 | Ht 63.0 in | Wt 112.6 lb

## 2014-03-17 DIAGNOSIS — E7800 Pure hypercholesterolemia, unspecified: Secondary | ICD-10-CM

## 2014-03-17 DIAGNOSIS — E039 Hypothyroidism, unspecified: Secondary | ICD-10-CM

## 2014-03-17 DIAGNOSIS — M8580 Other specified disorders of bone density and structure, unspecified site: Secondary | ICD-10-CM

## 2014-03-17 DIAGNOSIS — M949 Disorder of cartilage, unspecified: Secondary | ICD-10-CM

## 2014-03-17 DIAGNOSIS — M899 Disorder of bone, unspecified: Secondary | ICD-10-CM

## 2014-03-17 MED ORDER — RALOXIFENE HCL 60 MG PO TABS: 60.0000 mg | ORAL_TABLET | Freq: Every day | ORAL | Status: DC

## 2014-03-17 NOTE — Patient Instructions (Signed)
Evista daily

## 2014-03-17 NOTE — Progress Notes (Signed)
[name] 53 y.o.  Reason for Appointment:  Hypothyroidism, followup visit    History of Present Illness:   The hypothyroidism was first diagnosed in ? 1996  She had a goiter diagnosed in 1996.  She had a positive peroxidase antibody and has been on thyroxine since 1996 Her TSH was high in 2006 and she had symptoms of fatigue, cold intolerance and some hair loss Since then has been on 75 mcg of Synthroid Has generally been on a stable dose of thyroxine although since 2014 has been on the dosage 6 days a week  She had a TSH checked by her gynecologist routinely and was found to be high Since then she has been taking the levothyroxine daily  Complaints are reported by the patient now are mild fatigue which has been chronic but slightly better since increasing her dosage; has had chronic cold sensitivity which is not any worse                 Compliance with the medical regimen has been good with taking the tablet in the morning before breakfast.  Labs:  Lab Results  Component Value Date   FREET4 0.69 03/16/2014   FREET4 0.70 09/21/2013   TSH 3.37 03/16/2014   TSH 5.524* 01/14/2014   TSH 3.06 09/21/2013     Appointment on 03/16/2014  Component Date Value Ref Range Status  . Sodium 03/16/2014 139  135 - 145 mEq/L Final  . Potassium 03/16/2014 4.0  3.5 - 5.1 mEq/L Final  . Chloride 03/16/2014 108  96 - 112 mEq/L Final  . CO2 03/16/2014 23  19 - 32 mEq/L Final  . Glucose, Bld 03/16/2014 83  70 - 99 mg/dL Final  . BUN 16/08/9603 18  6 - 23 mg/dL Final  . Creatinine, Ser 03/16/2014 1.1  0.4 - 1.2 mg/dL Final  . Total Bilirubin 03/16/2014 0.2  0.2 - 1.2 mg/dL Final  . Alkaline Phosphatase 03/16/2014 46  39 - 117 U/L Final  . AST 03/16/2014 22  0 - 37 U/L Final  . ALT 03/16/2014 20  0 - 35 U/L Final  . Total Protein 03/16/2014 7.4  6.0 - 8.3 g/dL Final  . Albumin 54/07/8118 4.4  3.5 - 5.2 g/dL Final  . Calcium 14/78/2956 9.8  8.4 - 10.5 mg/dL Final  . GFR 21/30/8657 56.11*  >60.00 mL/min Final  . Cholesterol 03/16/2014 180  0 - 200 mg/dL Final   ATP III Classification       Desirable:  < 200 mg/dL               Borderline High:  200 - 239 mg/dL          High:  > = 846 mg/dL  . Triglycerides 03/16/2014 66.0  0.0 - 149.0 mg/dL Final   Normal:  <962 mg/dLBorderline High:  150 - 199 mg/dL  . HDL 03/16/2014 75.10  >39.00 mg/dL Final  . VLDL 95/28/4132 13.2  0.0 - 40.0 mg/dL Final  . LDL Cholesterol 03/16/2014 92  0 - 99 mg/dL Final  . Total CHOL/HDL Ratio 03/16/2014 2   Final                  Men          Women1/2 Average Risk     3.4          3.3Average Risk          5.0          4.42X  Average Risk          9.6          7.13X Average Risk          15.0          11.0                      . TSH 03/16/2014 3.37  0.35 - 4.50 uIU/mL Final  . Free T4 03/16/2014 0.69  0.60 - 1.60 ng/dL Final      Medication List       This list is accurate as of: 03/17/14 10:35 AM.  Always use your most recent med list.               ABILIFY 2 MG tablet  Generic drug:  ARIPiprazole  Take 1 mg by mouth daily.     ALPRAZolam 0.25 MG tablet  Commonly known as:  XANAX  Take 0.25 mg by mouth daily as needed for anxiety.     atorvastatin 20 MG tablet  Commonly known as:  LIPITOR  Take 1 tablet (20 mg total) by mouth daily.     Calcium-Vitamin D 600-200 MG-UNIT Caps  Take 2 tablets by mouth daily.     clonazePAM 0.5 MG tablet  Commonly known as:  KLONOPIN     ferrous sulfate 325 (65 FE) MG tablet  Take 325 mg by mouth daily with breakfast.     l-methylfolate-B6-B12 3-35-2 MG Tabs  Commonly known as:  METANX  Take 1 tablet by mouth daily.     levothyroxine 75 MCG tablet  Commonly known as:  SYNTHROID, LEVOTHROID  Take 1 tablet (75 mcg total) by mouth daily.     PARoxetine 30 MG tablet  Commonly known as:  PAXIL  Take 30 mg by mouth 2 (two) times daily.     ranitidine 75 MG tablet  Commonly known as:  ZANTAC  Take 75 mg by mouth daily as needed for heartburn.      SUMAtriptan 100 MG tablet  Commonly known as:  IMITREX  Take 100 mg by mouth daily as needed for migraine.     topiramate 100 MG tablet  Commonly known as:  TOPAMAX  - Take 50-100 mg by mouth 2 (two) times daily. 50 mg in the morning  - 100 mg at night     vitamin B-12 1000 MCG tablet  Commonly known as:  CYANOCOBALAMIN  Take 1,000 mcg by mouth daily.     vitamin C 500 MG tablet  Commonly known as:  ASCORBIC ACID  Take 500 mg by mouth daily.     VITAMIN D PO  Take by mouth.     zonisamide 50 MG capsule  Commonly known as:  ZONEGRAN        Allergies:  Allergies  Allergen Reactions  . Ampicillin Rash    Past Medical History  Diagnosis Date  . LGSIL (low grade squamous intraepithelial dysplasia) 2010  . OCD (obsessive compulsive disorder)   . Migraines   . Hypercholesteremia   . Osteopenia 12/2013    T score -1.8 FRAX 6.1%/0.7%  . Hypertension   . Hypothyroid     Past Surgical History  Procedure Laterality Date  . Cataract extraction  2005  . Breast lumpectomy  2001    right-benign    Family History  Problem Relation Age of Onset  . Hypertension Mother   . Heart disease Mother   . Hypertension Father   . Breast cancer  Paternal Aunt     Age 55    Social History:  reports that she has never smoked. She has never used smokeless tobacco. She reports that she drinks alcohol. She reports that she does not use illicit drugs.  REVIEW Of SYSTEMS:  OSTEOPENIA: She had a recent bone density done which showed worsening of her bone density compared to previous levels and her T score is now - 1.8 at all the sites except the spine. By comparison she has had a decline of between 5-8% in her bone density from 2 years ago She had a normal vitamin D level and is only taking calcium and vitamin D supplements currently  She has had hypercholesterolemia and also family history of CAD. Has been on Lipitor 20 mg for several years with no side effects and good control  Lab  Results  Component Value Date   CHOL 180 03/16/2014   HDL 75.10 03/16/2014   LDLCALC 92 03/16/2014   TRIG 66.0 03/16/2014   CHOLHDL 2 03/16/2014    No history of hypertension or diabetes   Examination:   BP 124/72  Pulse 96  Temp(Src) 97.9 F (36.6 C)  Resp 14  Ht 5\' 3"  (1.6 m)  Wt 112 lb 9.6 oz (51.075 kg)  BMI 19.95 kg/m2  SpO2 98%  LMP 11/19/2010    GENERAL APPEARANCE: Alert And looks well.           NECK:  left lobe is enlarged about 1-1/2-2 times normal especially laterally and is firm       NEUROLOGIC EXAM:  biceps reflexes  appear to be normal    Assessments   Hypothyroidism with known history of goiter, now requiring relatively higher doses of supplement She does have a palpable left lobe of thyroid similar to when she had an ultrasound in 2013 and no thyroid nodule had been seen previously; likely to be related to Hashimoto thyroiditis  OSTEOPENIA: She is an early menopause and is showing signs of worsening of her bone density. Although she is still only osteopenic she is concerned about progression of his. Discussed with her that it may be reasonable to use a medication to stabilize her bone density and prevent fracture long-term. Given her the option of using bisphosphonates or Evista and explained to her the pros and cons of both the drugs as well as possible side effects and benefits  Hypercholesterolemia: Excellent control with Lipitor   Treatment:   Continue same dosage of Synthroid before breakfast daily.  Continue to follow her thyroid enlargement clinically  For the osteopenia she agrees to start Evista. She may try this every other day in the beginning in case she has hot flashes  Followup in 6 months  Counseling time over 50% of today's 25 minute visit  Reather LittlerAjay Yoan Sallade 03/17/2014, 10:35 AM

## 2014-05-31 ENCOUNTER — Telehealth: Payer: Self-pay | Admitting: *Deleted

## 2014-05-31 NOTE — Telephone Encounter (Signed)
Pt calling c/o lump/bump below rib cage asked what the bump could be, I explained to pt MD would have see it whether PCP on Dr.fontaine. Pt would like to see TF. Appointment to schedule.

## 2014-06-01 ENCOUNTER — Encounter: Payer: Self-pay | Admitting: Gynecology

## 2014-06-01 ENCOUNTER — Ambulatory Visit (INDEPENDENT_AMBULATORY_CARE_PROVIDER_SITE_OTHER): Payer: BC Managed Care – PPO | Admitting: Gynecology

## 2014-06-01 DIAGNOSIS — R1900 Intra-abdominal and pelvic swelling, mass and lump, unspecified site: Secondary | ICD-10-CM

## 2014-06-01 NOTE — Progress Notes (Signed)
Ashlee HashimotoJanet C Chavez 11/18/1960 [id number]        53 y.o.  G0P0 presents complaining of a mass in her left upper quadrant along the costal margin. Noticed over the past week or so. Not having any pain or discomfort. No nausea vomiting diarrhea constipation. Sometimes has problems feeling the area and seems to be occurring intermittently. Showed it to the nurse at work who felt it also and told her to see her doctor.  Past medical history,surgical history, problem list, medications, allergies, family history and social history were all reviewed and documented in the EPIC chart.  Directed ROS with pertinent positives and negatives documented in the history of present illness/assessment and plan.  Exam: General appearance:  Normal Lower chest wall/abdomen examined without evidence of masses, hernia, discomfort or any abnormality. Patient is pointing to the mid to lateral subcostal line in her left upper quadrant.  Assessment/Plan:  53 y.o. G0P0 reported mass size of golf ball in left upper quadrant unable to palpate now by myself or by the patient. Having no symptoms whatsoever. We'll start with baseline ultrasound of the upper abdomen. If normal and patient can no longer feel this area will follow. If she continues to feel this area will consider referral to general surgeon.  I also reviewed her DEXA from earlier this year which showed some loss of bone but still within the osteopenic range with a low FRAX. Recommended continuing her vitamin D supplementation and calcium as well as weightbearing exercise. Plan repeat at 2 year interval.   Note: This document was prepared with digital dictation and possible smart phrase technology. Any transcriptional errors that result from this process are unintentional.   Dara LordsFONTAINE,Kegan Mckeithan P MD, 11:32 AM 06/01/2014

## 2014-06-01 NOTE — Patient Instructions (Signed)
Office will call you to arrange ultrasound. You will call me if this area persists or recurs

## 2014-06-02 ENCOUNTER — Telehealth: Payer: Self-pay | Admitting: *Deleted

## 2014-06-02 DIAGNOSIS — R1900 Intra-abdominal and pelvic swelling, mass and lump, unspecified site: Secondary | ICD-10-CM

## 2014-06-02 NOTE — Telephone Encounter (Signed)
Message copied by Aura CampsWEBB, JENNIFER L on Thu Jun 02, 2014 12:28 PM ------      Message from: Dara LordsFONTAINE, TIMOTHY P      Created: Wed Jun 01, 2014 11:34 AM       Schedule upper abdominal ultrasound reference patient reports palpating a mass in left upper quadrant but was unable to demonstrate at office visit. Physician exam was normal. ------

## 2014-06-02 NOTE — Telephone Encounter (Signed)
[facility] . Pt aware she must be npo after midnight tonight.

## 2014-06-03 ENCOUNTER — Telehealth: Payer: Self-pay

## 2014-06-03 ENCOUNTER — Ambulatory Visit (HOSPITAL_COMMUNITY)
Admission: RE | Admit: 2014-06-03 | Discharge: 2014-06-03 | Disposition: A | Discharge status: Home / Self Care | Payer: BC Managed Care – PPO | Source: Ambulatory Visit | Attending: Gynecology | Admitting: Gynecology

## 2014-06-03 DIAGNOSIS — R1902 Left upper quadrant abdominal swelling, mass and lump: Secondary | ICD-10-CM | POA: Insufficient documentation

## 2014-06-03 DIAGNOSIS — R1900 Intra-abdominal and pelvic swelling, mass and lump, unspecified site: Secondary | ICD-10-CM

## 2014-06-03 DIAGNOSIS — I700 Atherosclerosis of aorta: Secondary | ICD-10-CM | POA: Insufficient documentation

## 2014-06-03 NOTE — Telephone Encounter (Signed)
Patient said she had u/s at 7:30am this morning. Hoped to hear about result today so she did not have to go through weekend wondering.

## 2014-06-03 NOTE — Telephone Encounter (Signed)
Patient informed. 

## 2014-06-03 NOTE — Telephone Encounter (Signed)
Tell patient that the radiologist feels that the area represents a calcified cartilage part of her rib. They saw no masses or other abnormalities. They also noted that they did not see any kidney stones.

## 2014-11-21 ENCOUNTER — Other Ambulatory Visit: Payer: Self-pay | Admitting: Endocrinology

## 2014-11-24 ENCOUNTER — Encounter: Payer: Self-pay | Admitting: Gynecology

## 2014-11-25 ENCOUNTER — Telehealth: Payer: Self-pay | Admitting: *Deleted

## 2014-11-25 NOTE — Telephone Encounter (Signed)
Pt called stating she had abnormal mammogram at [facility] told to come back to have additional views. I left detailed message on pt voicemail about the mammogram which showed left breast calcifications with answer to her pt questions regarding this.

## 2014-11-30 ENCOUNTER — Encounter: Payer: Self-pay | Admitting: Gynecology

## 2014-12-29 ENCOUNTER — Ambulatory Visit (INDEPENDENT_AMBULATORY_CARE_PROVIDER_SITE_OTHER): Payer: BLUE CROSS/BLUE SHIELD | Admitting: Gynecology

## 2014-12-29 ENCOUNTER — Encounter: Payer: Self-pay | Admitting: Gynecology

## 2014-12-29 VITALS — BP 120/76 | Ht 63.0 in | Wt 110.0 lb

## 2014-12-29 DIAGNOSIS — Z01419 Encounter for gynecological examination (general) (routine) without abnormal findings: Secondary | ICD-10-CM

## 2014-12-29 DIAGNOSIS — N952 Postmenopausal atrophic vaginitis: Secondary | ICD-10-CM

## 2014-12-29 DIAGNOSIS — M8580 Other specified disorders of bone density and structure, unspecified site: Secondary | ICD-10-CM

## 2014-12-29 NOTE — Progress Notes (Signed)
Helen HashimotoJanet C Sproule 08/11/1961 [id number]        54 y.o.  G0P0 for annual exam.  Doing well without complaints.  Past medical history,surgical history, problem list, medications, allergies, family history and social history were all reviewed and documented as reviewed in the EPIC chart.  ROS:  Performed with pertinent positives and negatives included in the history, assessment and plan.   Additional significant findings :  none   Exam: [facility] person[facility]istant Filed Vitals:   12/29/14 1550  BP: 120/76  Height: 5\' 3"  (1.6 m)  Weight: 110 lb (49.896 kg)   General appearance:  Normal affect, orientation and appearance. Skin: Grossly normal HEENT: Without gross lesions.  No cervical or supraclavicular adenopathy. Thyroid normal.  Lungs:  Clear without wheezing, rales or rhonchi Cardiac: RR, without RMG Abdominal:  Soft, nontender, without masses, guarding, rebound, organomegaly or hernia Breasts:  Examined lying and sitting without masses, retractions, discharge or axillary adenopathy. Pelvic:  Ext/BUS/vagina with atrophic changes. Upper vaginal constriction band noted before cervix.  Cervix atrophic  Uterus axial, normal size, shape and contour, midline and mobile nontender   Adnexa  Without masses or tenderness    Anus and perineum  Normal   Rectovaginal  Normal sphincter tone without palpated masses or tenderness.    Assessment/Plan:  54 y.o. G0P0 female for annual exam.   1. Postmenopausal/atrophic genital changes. Without symptoms of hot flashes night sweats vaginal dryness. Is not sexually active. No vaginal bleeding. Continue to monitor and call if any vaginal bleeding. 2. Pap smear/HPV negative 2013. No Pap smear done today. History of LGSIL 2010. Normal Pap smears since then. Plan repeat Pap smear at 3-5 year interval. 3. Left upper quadrant mass.  Patient was evaluated this past year complaining of a left upper quadrant palpable area along the costal margin. Ultrasound was negative in  the area she was pointing to looked like the costal margin. Exam today is negative and I am feeling the lateral costal margin where she is pointing to I think this is what she is feeling. There are no clear masses or other abnormalities. Patient notes that this area that she feels has remained unchanged and she'll continue to monitor this and report any changes but otherwise at this point we'll follow expectantly and she is comfortable with this. 4. Osteopenia.  DEXA 12/2013 T score -1.8 FRAX 6.1%/0.7%. Increased calcium vitamin D reviewed. Plan repeat DEXA next year to year interval. 5. Colonoscopy 2012. Repeat at their recommended interval. 6. Mammography 11/2014. Continue with annual mammography. SBE monthly reviewed. 7. Health maintenance. No routine blood work done as this is done at her primary physician's office. Follow up 1 year, sooner as needed.     Dara LordsFONTAINE,Jaelin Fackler P MD, 4:40 PM 12/29/2014

## 2014-12-29 NOTE — Patient Instructions (Signed)
You may obtain a copy of any labs that were done today by logging onto MyChart as outlined in the instructions provided with your AVS (after visit summary). The office will not call with normal lab results but certainly if there are any significant abnormalities then we will contact you.   Health Maintenance, Female A healthy lifestyle and preventative care can promote health and wellness.  Maintain regular health, dental, and eye exams.  Eat a healthy diet. Foods like vegetables, fruits, whole grains, low-fat dairy products, and lean protein foods contain the nutrients you need without too many calories. Decrease your intake of foods high in solid fats, added sugars, and salt. Get information about a proper diet from your caregiver, if necessary.  Regular physical exercise is one of the most important things you can do for your health. Most adults should get at least 150 minutes of moderate-intensity exercise (any activity that increases your heart rate and causes you to sweat) each week. In addition, most adults need muscle-strengthening exercises on 2 or more days a week.   Maintain a healthy weight. The body mass index (BMI) is a screening tool to identify possible weight problems. It provides an estimate of body fat based on height and weight. Your caregiver can help determine your BMI, and can help you achieve or maintain a healthy weight. For adults 20 years and older:  A BMI below 18.5 is considered underweight.  A BMI of 18.5 to 24.9 is normal.  A BMI of 25 to 29.9 is considered overweight.  A BMI of 30 and above is considered obese.  Maintain normal blood lipids and cholesterol by exercising and minimizing your intake of saturated fat. Eat a balanced diet with plenty of fruits and vegetables. Blood tests for lipids and cholesterol should begin at age 62 and be repeated every 5 years. If your lipid or cholesterol levels are high, you are over 50, or you are a high risk for heart  disease, you may need your cholesterol levels checked more frequently.Ongoing high lipid and cholesterol levels should be treated with medicines if diet and exercise are not effective.  If you smoke, find out from your caregiver how to quit. If you do not use tobacco, do not start.  Lung cancer screening is recommended for adults aged 64 80 years who are at high risk for developing lung cancer because of a history of smoking. Yearly low-dose computed tomography (CT) is recommended for people who have at least a 30-pack-year history of smoking and are a current smoker or have quit within the past 15 years. A pack year of smoking is smoking an average of 1 pack of cigarettes a day for 1 year (for example: 1 pack a day for 30 years or 2 packs a day for 15 years). Yearly screening should continue until the smoker has stopped smoking for at least 15 years. Yearly screening should also be stopped for people who develop a health problem that would prevent them from having lung cancer treatment.  If you are pregnant, do not drink alcohol. If you are breastfeeding, be very cautious about drinking alcohol. If you are not pregnant and choose to drink alcohol, do not exceed 1 drink per day. One drink is considered to be 12 ounces (355 mL) of beer, 5 ounces (148 mL) of wine, or 1.5 ounces (44 mL) of liquor.  Avoid use of street drugs. Do not share needles with anyone. Ask for help if you need support or instructions about stopping  the use of drugs.  High blood pressure causes heart disease and increases the risk of stroke. Blood pressure should be checked at least every 1 to 2 years. Ongoing high blood pressure should be treated with medicines, if weight loss and exercise are not effective.  If you are 22 to 56 years old, ask your caregiver if you should take aspirin to prevent strokes.  Diabetes screening involves taking a blood sample to check your fasting blood sugar level. This should be done once every 3  years, after age 77, if you are within normal weight and without risk factors for diabetes. Testing should be considered at a younger age or be carried out more frequently if you are overweight and have at least 1 risk factor for diabetes.  Breast cancer screening is essential preventative care for women. You should practice "breast self-awareness." This means understanding the normal appearance and feel of your breasts and may include breast self-examination. Any changes detected, no matter how small, should be reported to a caregiver. Women in their 20s and 30s should have a clinical breast exam (CBE) by a caregiver as part of a regular health exam every 1 to 3 years. After age 12, women should have a CBE every year. Starting at age 52, women should consider having a mammogram (breast X-ray) every year. Women who have a family history of breast cancer should talk to their caregiver about genetic screening. Women at a high risk of breast cancer should talk to their caregiver about having an MRI and a mammogram every year.  Breast cancer gene (BRCA)-related cancer risk assessment is recommended for women who have family members with BRCA-related cancers. BRCA-related cancers include breast, ovarian, tubal, and peritoneal cancers. Having family members with these cancers may be associated with an increased risk for harmful changes (mutations) in the breast cancer genes BRCA1 and BRCA2. Results of the assessment will determine the need for genetic counseling and BRCA1 and BRCA2 testing.  The Pap test is a screening test for cervical cancer. Women should have a Pap test starting at age 28. Between ages 58 and 22, Pap tests should be repeated every 2 years. Beginning at age 55, you should have a Pap test every 3 years as long as the past 3 Pap tests have been normal. If you had a hysterectomy for a problem that was not cancer or a condition that could lead to cancer, then you no longer need Pap tests. If you are  between ages 64 and 29, and you have had normal Pap tests going back 10 years, you no longer need Pap tests. If you have had past treatment for cervical cancer or a condition that could lead to cancer, you need Pap tests and screening for cancer for at least 20 years after your treatment. If Pap tests have been discontinued, risk factors (such as a new sexual partner) need to be reassessed to determine if screening should be resumed. Some women have medical problems that increase the chance of getting cervical cancer. In these cases, your caregiver may recommend more frequent screening and Pap tests.  The human papillomavirus (HPV) test is an additional test that may be used for cervical cancer screening. The HPV test looks for the virus that can cause the cell changes on the cervix. The cells collected during the Pap test can be tested for HPV. The HPV test could be used to screen women aged 6 years and older, and should be used in women of any age  who have unclear Pap test results. After the age of 76, women should have HPV testing at the same frequency as a Pap test.  Colorectal cancer can be detected and often prevented. Most routine colorectal cancer screening begins at the age of 52 and continues through age 25. However, your caregiver may recommend screening at an earlier age if you have risk factors for colon cancer. On a yearly basis, your caregiver may provide home test kits to check for hidden blood in the stool. Use of a small camera at the end of a tube, to directly examine the colon (sigmoidoscopy or colonoscopy), can detect the earliest forms of colorectal cancer. Talk to your caregiver about this at age 23, when routine screening begins. Direct examination of the colon should be repeated every 5 to 10 years through age 78, unless early forms of pre-cancerous polyps or small growths are found.  Hepatitis C blood testing is recommended for all people born from [birth year] through 1967 and any  individual with known risks for hepatitis C.  Practice safe sex. Use condoms and avoid high-risk sexual practices to reduce the spread of sexually transmitted infections (STIs). Sexually active women aged 61 and younger should be checked for Chlamydia, which is a common sexually transmitted infection. Older women with new or multiple partners should also be tested for Chlamydia. Testing for other STIs is recommended if you are sexually active and at increased risk.  Osteoporosis is a disease in which the bones lose minerals and strength with aging. This can result in serious bone fractures. The risk of osteoporosis can be identified using a bone density scan. Women ages 68 and over and women at risk for fractures or osteoporosis should discuss screening with their caregivers. Ask your caregiver whether you should be taking a calcium supplement or vitamin D to reduce the rate of osteoporosis.  Menopause can be associated with physical symptoms and risks. Hormone replacement therapy is available to decrease symptoms and risks. You should talk to your caregiver about whether hormone replacement therapy is right for you.  Use sunscreen. Apply sunscreen liberally and repeatedly throughout the day. You should seek shade when your shadow is shorter than you. Protect yourself by wearing long sleeves, pants, a wide-brimmed hat, and sunglasses year round, whenever you are outdoors.  Notify your caregiver of new moles or changes in moles, especially if there is a change in shape or color. Also notify your caregiver if a mole is larger than the size of a pencil eraser.  Stay current with your immunizations. Document Released: 05/13/2011 Document Revised: 02/22/2013 Document Reviewed: 05/13/2011 [facility] Patient Information 2014 [place].

## 2014-12-30 LAB — URINALYSIS W MICROSCOPIC + REFLEX CULTURE
BACTERIA UA: NONE SEEN
BILIRUBIN URINE: NEGATIVE
CRYSTALS: NONE SEEN
Casts: NONE SEEN
Glucose, UA: NEGATIVE mg/dL
KETONES UR: NEGATIVE mg/dL
Leukocytes, UA: NEGATIVE
NITRITE: NEGATIVE
PROTEIN: NEGATIVE mg/dL
Specific Gravity, Urine: 1.021 (ref 1.005–1.030)
Squamous Epithelial / LPF: NONE SEEN
Urobilinogen, UA: 0.2 mg/dL (ref 0.0–1.0)
pH: 6 (ref 5.0–8.0)

## 2014-12-31 LAB — URINE CULTURE
COLONY COUNT: NO GROWTH
Organism ID, Bacteria: NO GROWTH

## 2015-01-02 ENCOUNTER — Other Ambulatory Visit: Payer: Self-pay | Admitting: Gynecology

## 2015-01-02 DIAGNOSIS — R3129 Other microscopic hematuria: Secondary | ICD-10-CM

## 2015-01-03 ENCOUNTER — Other Ambulatory Visit: Payer: BLUE CROSS/BLUE SHIELD

## 2015-01-03 ENCOUNTER — Telehealth: Payer: Self-pay | Admitting: *Deleted

## 2015-01-03 DIAGNOSIS — R319 Hematuria, unspecified: Secondary | ICD-10-CM

## 2015-01-03 NOTE — Telephone Encounter (Signed)
Order placed

## 2015-01-03 NOTE — Telephone Encounter (Signed)
-----   Message from Jerilynn Mageslaudia Shaffer sent at 01/03/2015  9:05 AM EST ----- Regarding: lab order "Tell patient urine analysis showed microscopic blood. Recommend repeating a clean-catch urinalysis. If continues then will refer to urology." Patient coming in this afternoon please place order.

## 2015-01-05 LAB — URINALYSIS W MICROSCOPIC + REFLEX CULTURE
Bacteria, UA: NONE SEEN
Bilirubin Urine: NEGATIVE
Casts: NONE SEEN
Crystals: NONE SEEN
GLUCOSE, UA: NEGATIVE mg/dL
HGB URINE DIPSTICK: NEGATIVE
Ketones, ur: NEGATIVE mg/dL
Leukocytes, UA: NEGATIVE
Nitrite: NEGATIVE
PROTEIN: NEGATIVE mg/dL
SQUAMOUS EPITHELIAL / LPF: NONE SEEN
Specific Gravity, Urine: 1.01 (ref 1.005–1.030)
UROBILINOGEN UA: 0.2 mg/dL (ref 0.0–1.0)
pH: 7 (ref 5.0–8.0)

## 2015-01-18 ENCOUNTER — Other Ambulatory Visit: Payer: Self-pay | Admitting: Endocrinology

## 2015-01-24 ENCOUNTER — Other Ambulatory Visit: Payer: Self-pay | Admitting: Endocrinology

## 2015-03-04 ENCOUNTER — Other Ambulatory Visit: Payer: Self-pay | Admitting: Endocrinology

## 2015-03-20 ENCOUNTER — Other Ambulatory Visit (INDEPENDENT_AMBULATORY_CARE_PROVIDER_SITE_OTHER): Payer: Self-pay

## 2015-03-20 ENCOUNTER — Other Ambulatory Visit: Payer: Self-pay | Admitting: *Deleted

## 2015-03-20 DIAGNOSIS — E7800 Pure hypercholesterolemia, unspecified: Secondary | ICD-10-CM

## 2015-03-20 DIAGNOSIS — E039 Hypothyroidism, unspecified: Secondary | ICD-10-CM

## 2015-03-20 LAB — COMPREHENSIVE METABOLIC PANEL
ALT: 14 U/L (ref 0–35)
AST: 19 U/L (ref 0–37)
Albumin: 4.1 g/dL (ref 3.5–5.2)
Alkaline Phosphatase: 61 U/L (ref 39–117)
BUN: 21 mg/dL (ref 6–23)
CALCIUM: 9.5 mg/dL (ref 8.4–10.5)
CHLORIDE: 109 meq/L (ref 96–112)
CO2: 23 meq/L (ref 19–32)
Creatinine, Ser: 0.96 mg/dL (ref 0.40–1.20)
GFR: 64.03 mL/min (ref >60.00)
Glucose, Bld: 82 mg/dL (ref 70–99)
POTASSIUM: 4 meq/L (ref 3.5–5.1)
SODIUM: 139 meq/L (ref 135–145)
TOTAL PROTEIN: 7 g/dL (ref 6.0–8.3)
Total Bilirubin: 0.2 mg/dL (ref 0.2–1.2)

## 2015-03-20 LAB — LIPID PANEL
Cholesterol: 185 mg/dL (ref 0–200)
HDL: 79 mg/dL (ref >39.00)
LDL Cholesterol: 93 mg/dL (ref 0–99)
NONHDL: 106
TRIGLYCERIDES: 64 mg/dL (ref 0.0–149.0)
Total CHOL/HDL Ratio: 2
VLDL: 12.8 mg/dL (ref 0.0–40.0)

## 2015-03-20 LAB — T4, FREE: Free T4: 0.58 ng/dL — ABNORMAL LOW (ref 0.60–1.60)

## 2015-03-20 LAB — TSH: TSH: 2.97 u[IU]/mL (ref 0.35–4.50)

## 2015-03-23 ENCOUNTER — Ambulatory Visit (INDEPENDENT_AMBULATORY_CARE_PROVIDER_SITE_OTHER): Payer: Self-pay | Admitting: Endocrinology

## 2015-03-23 ENCOUNTER — Encounter: Payer: Self-pay | Admitting: Endocrinology

## 2015-03-23 VITALS — BP 108/72 | HR 88 | Temp 97.9°F | Resp 14 | Ht 63.0 in | Wt 112.8 lb

## 2015-03-23 DIAGNOSIS — E7800 Pure hypercholesterolemia, unspecified: Secondary | ICD-10-CM

## 2015-03-23 DIAGNOSIS — E039 Hypothyroidism, unspecified: Secondary | ICD-10-CM

## 2015-03-23 DIAGNOSIS — E063 Autoimmune thyroiditis: Secondary | ICD-10-CM

## 2015-03-23 DIAGNOSIS — M8580 Other specified disorders of bone density and structure, unspecified site: Secondary | ICD-10-CM

## 2015-03-23 MED ORDER — RISEDRONATE SODIUM 150 MG PO TABS: 150.0000 mg | ORAL_TABLET | ORAL | Status: DC

## 2015-03-23 NOTE — Progress Notes (Signed)
Ashlee Chavez 54 y.o.  Reason for Appointment:  Hypothyroidism, followup visit    History of Present Illness:   The hypothyroidism was first diagnosed in ? 1996  She had a goiter diagnosed in 1996.  She had a positive peroxidase antibody and has been on thyroxine since 1996 Her TSH was high in 2006 and she had symptoms of fatigue, cold intolerance and some hair loss Since then has been on 75 mcg of Synthroid with occasional adjustments in the dose  Since 01/2014 she has been taking the levothyroxine daily when TSH was slightly high and previously was taking the dose 6 days a week  Complaints are reported by the patient now are  fatigue which she thinks started after changing her antidepressant medication. Also sometimes has trouble sleeping  Has  had chronic cold sensitivity which is not any worse                 Compliance with the medical regimen has been good with taking the tablet in the morning before breakfast.  Does not take any calcium at the same time  Labs:  Lab Results  Component Value Date   FREET4 0.58* 03/20/2015   FREET4 0.69 03/16/2014   FREET4 0.70 09/21/2013   TSH 2.97 03/20/2015   TSH 3.37 03/16/2014   TSH 5.524* 01/14/2014     Lab on 03/20/2015  Component Date Value Ref Range Status  . Cholesterol 03/20/2015 185  0 - 200 mg/dL Final   ATP III Classification       Desirable:  < 200 mg/dL               Borderline High:  200 - 239 mg/dL          High:  > = 409 mg/dL  . Triglycerides 03/20/2015 64.0  0.0 - 149.0 mg/dL Final   Normal:  <811 mg/dLBorderline High:  150 - 199 mg/dL  . HDL 03/20/2015 79.00  >39.00 mg/dL Final  . VLDL 91/47/8295 12.8  0.0 - 40.0 mg/dL Final  . LDL Cholesterol 03/20/2015 93  0 - 99 mg/dL Final  . Total CHOL/HDL Ratio 03/20/2015 2   Final                  Men          Women1/2 Average Risk     3.4          3.3Average Risk          5.0          4.42X Average Risk          9.6          7.13X Average Risk          15.0          11.0                       . NonHDL 03/20/2015 106.00   Final   NOTE:  Non-HDL goal should be 30 mg/dL higher than patient's LDL goal (i.e. LDL goal of < 70 mg/dL, would have non-HDL goal of < 100 mg/dL)  . Sodium 03/20/2015 139  135 - 145 mEq/L Final  . Potassium 03/20/2015 4.0  3.5 - 5.1 mEq/L Final  . Chloride 03/20/2015 109  96 - 112 mEq/L Final  . CO2 03/20/2015 23  19 - 32 mEq/L Final  . Glucose, Bld 03/20/2015 82  70 - 99 mg/dL Final  . BUN 62/13/0865  21  6 - 23 mg/dL Final  . Creatinine, Ser 03/20/2015 0.96  0.40 - 1.20 mg/dL Final  . Total Bilirubin 03/20/2015 0.2  0.2 - 1.2 mg/dL Final  . Alkaline Phosphatase 03/20/2015 61  39 - 117 U/L Final  . AST 03/20/2015 19  0 - 37 U/L Final  . ALT 03/20/2015 14  0 - 35 U/L Final  . Total Protein 03/20/2015 7.0  6.0 - 8.3 g/dL Final  . Albumin 81/19/147805/07/2015 4.1  3.5 - 5.2 g/dL Final  . Calcium 29/56/213005/07/2015 9.5  8.4 - 10.5 mg/dL Final  . GFR 86/57/846905/07/2015 64.03  >60.00 mL/min Final  . TSH 03/20/2015 2.97  0.35 - 4.50 uIU/mL Final  . Free T4 03/20/2015 0.58* 0.60 - 1.60 ng/dL Final      Medication List       This list is accurate as of: 03/23/15  8:41 AM.  Always use your most recent med list.               ALPRAZolam 0.25 MG tablet  Commonly known as:  XANAX  Take 0.25 mg by mouth daily as needed for anxiety.     atorvastatin 20 MG tablet  Commonly known as:  LIPITOR  TAKE ONE TABLET BY MOUTH ONCE DAILY-NEEDS TO BE SEEN     Calcium-Vitamin D 600-200 MG-UNIT Caps  Take 2 tablets by mouth daily.     ferrous sulfate 325 (65 FE) MG tablet  Take 325 mg by mouth daily with breakfast.     l-methylfolate-B6-B12 3-35-2 MG Tabs  Commonly known as:  METANX  Take 1 tablet by mouth daily.     levothyroxine 75 MCG tablet  Commonly known as:  SYNTHROID, LEVOTHROID  Take 1 tablet (75 mcg total) by mouth daily.     SYNTHROID 75 MCG tablet  Generic drug:  levothyroxine  TAKE ONE TABLET BY MOUTH ONCE DAILY-SKIP SUNDAY     PARoxetine 40 MG  tablet  Commonly known as:  PAXIL     ranitidine 75 MG tablet  Commonly known as:  ZANTAC  Take 75 mg by mouth daily as needed for heartburn.     REXULTI 1 MG Tabs  Generic drug:  Brexpiprazole     SUMAtriptan 100 MG tablet  Commonly known as:  IMITREX  Take 100 mg by mouth daily as needed for migraine.     topiramate 100 MG tablet  Commonly known as:  TOPAMAX  - Take 50-100 mg by mouth 2 (two) times daily. 50 mg in the morning  - 100 mg at night     vitamin B-12 1000 MCG tablet  Commonly known as:  CYANOCOBALAMIN  Take 1,000 mcg by mouth daily.     vitamin C 500 MG tablet  Commonly known as:  ASCORBIC ACID  Take 500 mg by mouth daily.     VITAMIN D PO  Take by mouth.        Allergies:  Allergies  Allergen Reactions  . Ampicillin Rash    Past Medical History  Diagnosis Date  . LGSIL (low grade squamous intraepithelial dysplasia) 2010  . OCD (obsessive compulsive disorder)   . Migraines   . Hypercholesteremia   . Osteopenia 12/2013    T score -1.8 FRAX 6.1%/0.7%  . Hypertension   . Hypothyroid     Past Surgical History  Procedure Laterality Date  . Cataract extraction  2005  . Breast lumpectomy  2001    right-benign    Family History  Problem Relation Age  of Onset  . Hypertension Mother   . Heart disease Mother   . Hypertension Father   . Breast cancer Paternal Aunt     Age 56    Social History:  reports that she has never smoked. She has never used smokeless tobacco. She reports that she drinks alcohol. She reports that she does not use illicit drugs.  REVIEW Of SYSTEMS:  OSTEOPENIA: She had a recent bone density done which showed worsening of her bone density compared to previous levels and her T score was last - 1.8 at all the sites in 2015 except the spine. By comparison she has had a decline of between 5-8% in her bone density from 2 years before She had a normal vitamin D level and is only taking calcium and vitamin D supplements She  could not afford Eovist and stopped it after a month but did not notify us  She has had hypercholesterolemia and also family history of CAD. Has been on Lipitor 20 mg for several years with no side effects and good control  Lab Results  Component Value Date   CHOL 185 03/20/2015   HDL 79.00 03/20/2015   LDLCALC 93 03/20/2015   TRIG 64.0 03/20/2015   CHOLHDL 2 03/20/2015    No history of hypertension or diabetes   Examination:   BP 108/72 mmHg  Pulse 88  Temp(Src) 97.9 F (36.6 C)  Resp 14  Ht 5\' 3"  (1.6 m)  Wt 112 lb 12.8 oz (51.166 kg)  BMI 19.99 kg/m2  SpO2 97%  LMP 11/19/2010    GENERAL APPEARANCE:  she looks well.           NECK:  left lobe is enlarged about 1-1/2 times normal especially laterally and is firm, right lobe just palpable and soft       NEUROLOGIC EXAM:  biceps reflexes are normal No swelling of her hands or feet    Assessments   Hypothyroidism with known history of goiter, now requiring relatively stable doses of levothyroxine Her free T4 is slightly lower but not clear if this is medication related as TSH is about the same  Her fatigue is likely to be from her depression and antidepressants as well as inconsistent sleep  She does have a slightly firm palpable left lobe of thyroid as before, 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 Although she is still only osteopenic she is concerned about progression of his. Discussed with her that it may be reasonable to use a medication to stabilize her bone density and prevent fracture long-term. Given her the option of using bisphosphonates or Evista and explained to her the pros and cons of both the drugs as well as possible side effects and benefits  Hypercholesterolemia: Excellent control with Lipitor   Treatment:   Continue same dosage of Synthroid before breakfast daily.  Continue to follow her thyroid but  will need to check her in 6 months Consider increasing the dose if her free T4 is still relatively low or TSH trending higher  For the osteopenia she agrees to start Generic ACTONEL 150 mg monthly Discussed how to take this on an empty stomach and she will call if she has any GI upset  Followup in 6 months   Ashlee Chavez 03/23/2015, 8:41 AM

## 2015-05-01 ENCOUNTER — Other Ambulatory Visit: Payer: Self-pay | Admitting: *Deleted

## 2015-05-01 MED ORDER — LEVOTHYROXINE SODIUM 75 MCG PO TABS: 75.0000 ug | ORAL_TABLET | Freq: Every day | ORAL | Status: DC

## 2015-06-01 ENCOUNTER — Encounter: Payer: Self-pay | Admitting: Gynecology

## 2015-06-08 ENCOUNTER — Other Ambulatory Visit: Payer: Self-pay | Admitting: Endocrinology

## 2015-09-20 ENCOUNTER — Other Ambulatory Visit (INDEPENDENT_AMBULATORY_CARE_PROVIDER_SITE_OTHER): Payer: BLUE CROSS/BLUE SHIELD

## 2015-09-20 DIAGNOSIS — E039 Hypothyroidism, unspecified: Secondary | ICD-10-CM | POA: Diagnosis not present

## 2015-09-20 DIAGNOSIS — E063 Autoimmune thyroiditis: Secondary | ICD-10-CM

## 2015-09-20 DIAGNOSIS — M8580 Other specified disorders of bone density and structure, unspecified site: Secondary | ICD-10-CM | POA: Diagnosis not present

## 2015-09-20 LAB — BASIC METABOLIC PANEL
BUN: 14 mg/dL (ref 6–23)
CALCIUM: 9.7 mg/dL (ref 8.4–10.5)
CO2: 22 mEq/L (ref 19–32)
Chloride: 108 mEq/L (ref 96–112)
Creatinine, Ser: 0.86 mg/dL (ref 0.40–1.20)
GFR: 72.57 mL/min (ref >60.00)
GLUCOSE: 80 mg/dL (ref 70–99)
Potassium: 3.8 mEq/L (ref 3.5–5.1)
SODIUM: 141 meq/L (ref 135–145)

## 2015-09-20 LAB — TSH: TSH: 9.68 u[IU]/mL — AB (ref 0.35–4.50)

## 2015-09-20 LAB — T4, FREE: Free T4: 0.44 ng/dL — ABNORMAL LOW (ref 0.60–1.60)

## 2015-09-25 ENCOUNTER — Encounter: Payer: Self-pay | Admitting: Endocrinology

## 2015-09-25 ENCOUNTER — Ambulatory Visit (INDEPENDENT_AMBULATORY_CARE_PROVIDER_SITE_OTHER): Payer: BLUE CROSS/BLUE SHIELD | Admitting: Endocrinology

## 2015-09-25 VITALS — BP 110/68 | HR 105 | Temp 98.1°F | Resp 14 | Ht 63.0 in | Wt 107.8 lb

## 2015-09-25 DIAGNOSIS — M8580 Other specified disorders of bone density and structure, unspecified site: Secondary | ICD-10-CM | POA: Diagnosis not present

## 2015-09-25 DIAGNOSIS — E039 Hypothyroidism, unspecified: Secondary | ICD-10-CM

## 2015-09-25 DIAGNOSIS — E063 Autoimmune thyroiditis: Secondary | ICD-10-CM

## 2015-09-25 MED ORDER — LEVOTHYROXINE SODIUM 88 MCG PO TABS: 88.0000 ug | ORAL_TABLET | Freq: Every day | ORAL | Status: DC

## 2015-09-25 NOTE — Patient Instructions (Signed)
Take Vitamin at dinner  

## 2015-09-25 NOTE — Progress Notes (Signed)
Ashlee Chavez 54 y.o.  Reason for Appointment:  Hypothyroidism, followup visit    History of Present Illness:   The hypothyroidism was first diagnosed in ? 1996  She had a goiter diagnosed in 1996.  She had a positive peroxidase antibody and has been on thyroxine since 1996 Her TSH was high in 2006 and she had symptoms of fatigue, cold intolerance and some hair loss Since then has been on 75 mcg of Synthroid with occasional adjustments in the dose  Since 01/2014 she has been taking the same dose of levothyroxine, 75 g daily  Complaints are reported by the patient now are fatigue and more difficulties with her anxiety Also has trouble sleeping, also  tends to feel more sleepy when she comes back from work  Has had chronic cold sensitivity which is not any worse                 Compliance with the medical regimen has been good with taking the tablet in the morning before breakfast.   Does  take her multivitamin with the same time and has been doing this for the last 3 months.  Apparently this contains calcium and iron.  Previously taking these vitamin separately in the evening  Had lost weight, decreased appetite  Wt Readings from Last 3 Encounters:  09/25/15 107 lb 12.8 oz (48.898 kg)  03/23/15 112 lb 12.8 oz (51.166 kg)  12/29/14 110 lb (49.896 kg)    Labs:  Lab Results  Component Value Date   TSH 9.68* 09/20/2015   TSH 2.97 03/20/2015   TSH 3.37 03/16/2014   FREET4 0.44* 09/20/2015   FREET4 0.58* 03/20/2015   FREET4 0.69 03/16/2014      Appointment on 09/20/2015  Component Date Value Ref Range Status  . TSH 09/20/2015 9.68* 0.35 - 4.50 uIU/mL Final  . Free T4 09/20/2015 0.44* 0.60 - 1.60 ng/dL Final  . Sodium 16/08/9603 141  135 - 145 mEq/L Final  . Potassium 09/20/2015 3.8  3.5 - 5.1 mEq/L Final  . Chloride 09/20/2015 108  96 - 112 mEq/L Final  . CO2 09/20/2015 22  19 - 32 mEq/L Final  . Glucose, Bld 09/20/2015 80  70 - 99 mg/dL Final  . BUN 54/07/8118 14  6  - 23 mg/dL Final  . Creatinine, Ser 09/20/2015 0.86  0.40 - 1.20 mg/dL Final  . Calcium 14/78/2956 9.7  8.4 - 10.5 mg/dL Final  . GFR 21/30/8657 72.57  >60.00 mL/min Final      Medication List       This list is accurate as of: 09/25/15  8:44 AM.  Always use your most recent med list.               ALPRAZolam 0.25 MG tablet  Commonly known as:  XANAX  Take 0.25 mg by mouth daily as needed for anxiety.     atorvastatin 20 MG tablet  Commonly known as:  LIPITOR  TAKE ONE TABLET BY MOUTH ONCE DAILY **NEEDS TO BE SEEN**     levothyroxine 88 MCG tablet  Commonly known as:  SYNTHROID, LEVOTHROID  Take 1 tablet (88 mcg total) by mouth daily.     PARoxetine 40 MG tablet  Commonly known as:  PAXIL     ranitidine 75 MG tablet  Commonly known as:  ZANTAC  Take 75 mg by mouth daily as needed for heartburn.     risedronate 150 MG tablet  Commonly known as:  ACTONEL  Take 1 tablet (  150 mg total) by mouth every 30 (thirty) days. with water on empty stomach, nothing by mouth or lie down for next 30 minutes.     SUMAtriptan 100 MG tablet  Commonly known as:  IMITREX  Take 100 mg by mouth daily as needed for migraine.     topiramate 100 MG tablet  Commonly known as:  TOPAMAX  Take 50-100 mg by mouth 2 (two) times daily. 50 mg in the morning 100 mg at night        Allergies:  Allergies  Allergen Reactions  . Ampicillin Rash    Past Medical History  Diagnosis Date  . LGSIL (low grade squamous intraepithelial dysplasia) 2010  . OCD (obsessive compulsive disorder)   . Migraines   . Hypercholesteremia   . Osteopenia 12/2013    T score -1.8 FRAX 6.1%/0.7%  . Hypertension   . Hypothyroid     Past Surgical History  Procedure Laterality Date  . Cataract extraction  2005  . Breast lumpectomy  2001    right-benign    Family History  Problem Relation Age of Onset  . Hypertension Mother   . Heart disease Mother   . Hypertension Father   . Breast cancer Paternal Aunt      Age 27    Social History:  reports that she has never smoked. She has never used smokeless tobacco. She reports that she drinks alcohol. She reports that she does not use illicit drugs.  REVIEW Of SYSTEMS:  OSTEOPENIA: She had a recent bone density done which showed worsening of her bone density compared to previous levels and her T score was last - 1.8 at all the sites in 2015 except the spine.  By comparison she has had a decline of between 5-8% in her bone density from 2 years before She had a normal vitamin D level and is only taking calcium and vitamin D supplements in her multivitamin  She could not afford  Evista and is not on Actonel either because of cost even though it is Generic  She has had hypercholesterolemia and also family history of CAD. Has been on Lipitor 20 mg for several years with no side effects and good control  Lab Results  Component Value Date   CHOL 185 03/20/2015   HDL 79.00 03/20/2015   LDLCALC 93 03/20/2015   TRIG 64.0 03/20/2015   CHOLHDL 2 03/20/2015      Examination:   BP 110/68 mmHg  Pulse 105  Temp(Src) 98.1 F (36.7 C)  Resp 14  Ht  (1.6 m)  Wt 107 lb 12.8 oz (48.898 kg)  BMI 19.10 kg/m2  SpO2 97%  LMP 11/19/2010   She is mildly anxious and somewhat tearful at times        NECK:  right lobe is enlarged about 1-1/2-2 times normal, left lobe just palpable  NEUROLOGIC EXAM:  biceps reflexes are slightly slow   No swelling of her hands or eyes    Assessments   Hypothyroidism with known history of goiter, was requiring relatively stable doses of levothyroxine Not clear if this time her TSH is abnormally high because of interaction with her multivitamin nor some progression of her hypothyroidism. She does have nonspecific fatigue and sleepiness but also has had more issues with her anxiety and depression Thyroid still mildly enlarged   OSTEOPENIA: She had an early menopause and was recommended Actonel because of gradual  decline in bone density in 2015 She cannot afford this currently and will hold  off until her repeat bone density next year  Hypercholesterolemia: Excellent control with Lipitor, she is compliant with this and will continue   Treatment:   Synthroid 88 g daily May take genetic Take multivitamin in the evening  Discussed starting back on regular exercise to help her mood and anxiety  Followup in 2 months   Iyah Laguna 09/25/2015, 8:44 AM

## 2015-10-03 ENCOUNTER — Ambulatory Visit (HOSPITAL_COMMUNITY): Payer: BLUE CROSS/BLUE SHIELD | Admitting: Psychiatry

## 2015-11-20 ENCOUNTER — Other Ambulatory Visit: Payer: Self-pay | Admitting: Gynecology

## 2015-11-20 DIAGNOSIS — M8580 Other specified disorders of bone density and structure, unspecified site: Secondary | ICD-10-CM

## 2015-11-20 DIAGNOSIS — M8589 Other specified disorders of bone density and structure, multiple sites: Secondary | ICD-10-CM

## 2015-11-20 DIAGNOSIS — Z13820 Encounter for screening for osteoporosis: Secondary | ICD-10-CM

## 2015-11-22 ENCOUNTER — Other Ambulatory Visit (INDEPENDENT_AMBULATORY_CARE_PROVIDER_SITE_OTHER): Payer: BLUE CROSS/BLUE SHIELD

## 2015-11-22 DIAGNOSIS — E039 Hypothyroidism, unspecified: Secondary | ICD-10-CM

## 2015-11-22 DIAGNOSIS — E063 Autoimmune thyroiditis: Secondary | ICD-10-CM

## 2015-11-22 LAB — TSH: TSH: 5.03 u[IU]/mL — AB (ref 0.35–4.50)

## 2015-11-24 ENCOUNTER — Ambulatory Visit (INDEPENDENT_AMBULATORY_CARE_PROVIDER_SITE_OTHER): Payer: BLUE CROSS/BLUE SHIELD | Admitting: Endocrinology

## 2015-11-24 ENCOUNTER — Encounter: Payer: Self-pay | Admitting: Endocrinology

## 2015-11-24 VITALS — BP 110/80 | Temp 98.2°F | Ht 63.0 in | Wt 107.4 lb

## 2015-11-24 DIAGNOSIS — M8580 Other specified disorders of bone density and structure, unspecified site: Secondary | ICD-10-CM

## 2015-11-24 DIAGNOSIS — E7800 Pure hypercholesterolemia, unspecified: Secondary | ICD-10-CM

## 2015-11-24 DIAGNOSIS — E039 Hypothyroidism, unspecified: Secondary | ICD-10-CM | POA: Diagnosis not present

## 2015-11-24 DIAGNOSIS — E063 Autoimmune thyroiditis: Secondary | ICD-10-CM

## 2015-11-24 NOTE — Progress Notes (Signed)
Ashlee Chavez 55 y.o.  Reason for Appointment:  Hypothyroidism, followup visit    History of Present Illness:   The hypothyroidism was first diagnosed in ? 1996  She had a goiter diagnosed in 1996.  She had a positive peroxidase antibody and has been on thyroxine since 1996 Her TSH was high in 2006 and she had symptoms of fatigue, cold intolerance and some hair loss Since then has been on 75 mcg of Synthroid with occasional adjustments in the dose  Since 01/2014 she had been taking the same dose of levothyroxine, 75 g daily Her TSH was higher than usual in 11/16 and she was having complaints about depression, insomnia, fatigue and more difficulties with her anxiety  Has had chronic cold sensitivity   Her dose was increased in 11/16 up to 88 g With this she says her depression is better, she is sleeping well and has more energy                 Compliance with the medical regimen has been good with taking the tablet in the morning before breakfast.   Does  take her multivitamin separately now  Has decreased appetite, not much better but her weight has leveled off  Wt Readings from Last 3 Encounters:  11/24/15 107 lb 6.4 oz (48.716 kg)  09/25/15 107 lb 12.8 oz (48.898 kg)  03/23/15 112 lb 12.8 oz (51.166 kg)    Labs:  Lab Results  Component Value Date   TSH 5.03* 11/22/2015   TSH 9.68* 09/20/2015   TSH 2.97 03/20/2015   FREET4 0.44* 09/20/2015   FREET4 0.58* 03/20/2015   FREET4 0.69 03/16/2014      Lab on 11/22/2015  Component Date Value Ref Range Status  . TSH 11/22/2015 5.03* 0.35 - 4.50 uIU/mL Final      Medication List       This list is accurate as of: 11/24/15 12:45 PM.  Always use your most recent med list.               ALPRAZolam 0.25 MG tablet  Commonly known as:  XANAX  Take 0.25 mg by mouth daily as needed for anxiety.     atorvastatin 20 MG tablet  Commonly known as:  LIPITOR  TAKE ONE TABLET BY MOUTH ONCE DAILY **NEEDS TO BE SEEN**     levothyroxine 88 MCG tablet  Commonly known as:  SYNTHROID, LEVOTHROID  Take 1 tablet (88 mcg total) by mouth daily.     PARoxetine 40 MG tablet  Commonly known as:  PAXIL     ranitidine 75 MG tablet  Commonly known as:  ZANTAC  Take 75 mg by mouth daily as needed for heartburn.     SUMAtriptan 100 MG tablet  Commonly known as:  IMITREX  Take 100 mg by mouth daily as needed for migraine.     topiramate 100 MG tablet  Commonly known as:  TOPAMAX  Take 50-100 mg by mouth 2 (two) times daily. 50 mg in the morning 100 mg at night        Allergies:  Allergies  Allergen Reactions  . Ampicillin Rash    Past Medical History  Diagnosis Date  . LGSIL (low grade squamous intraepithelial dysplasia) 2010  . OCD (obsessive compulsive disorder)   . Migraines   . Hypercholesteremia   . Osteopenia 12/2013    T score -1.8 FRAX 6.1%/0.7%  . Hypertension   . Hypothyroid     Past Surgical History  Procedure  Laterality Date  . Cataract extraction  2005  . Breast lumpectomy  2001    right-benign    Family History  Problem Relation Age of Onset  . Hypertension Mother   . Heart disease Mother   . Hypertension Father   . Breast cancer Paternal Aunt     Age 57    Social History:  reports that she has never smoked. She has never used smokeless tobacco. She reports that she drinks alcohol. She reports that she does not use illicit drugs.  REVIEW Of SYSTEMS:  OSTEOPENIA: She had a  bone density done which showed worsening of her bone density compared to previous levels and her T score was last - 1.8 at all the sites in 2015 except the spine.  By comparison she has had a decline of between 5-8% in her bone density from 2 years before She had a normal vitamin D level and is only taking calcium and vitamin D supplements in her multivitamin  She could not afford  Evista and is not on Actonel either because of cost even though it is Generic She is going to get another bone density in  2/17  She has had hypercholesterolemia and also family history of CAD.  Has been on Lipitor 20 mg for several years with no side effects and good control  Lab Results  Component Value Date   CHOL 185 03/20/2015   HDL 79.00 03/20/2015   LDLCALC 93 03/20/2015   TRIG 64.0 03/20/2015   CHOLHDL 2 03/20/2015      Examination:   BP 110/80 mmHg  Temp(Src) 98.2 F (36.8 C) (Oral)  Ht 5\' 3"  (1.6 m)  Wt 107 lb 6.4 oz (48.716 kg)  BMI 19.03 kg/m2  LMP 11/19/2010         NECK:  right lobe is enlarged about 1-1/2 times normal, isthmus also enlarged and firm, left lobe just palpable  NEUROLOGIC EXAM:  biceps reflexes are not slow      Assessments   Hypothyroidism with known history of goiter, was requiring relatively stable doses of levothyroxine Now she is having better results with increasing her dose to 88 g which has also helped her depression She also is not taking her vitamin at the same time as directed  TSH is improved but mildly increased around 5.0 now Thyroid still mildly enlarged  OSTEOPENIA: She had an early menopause and was recommended Actonel because of gradual decline in bone density in 2015 Bone density to be done soon    Treatment:   Synthroid 88 g daily, extra half tablet once a week  Will review her bone density when available, consider Reclast every 2 years if still has osteopenia, she has some concerns about cost of medications   Tod Abrahamsen 11/24/2015, 12:45 PM

## 2015-11-24 NOTE — Patient Instructions (Signed)
Extra 1/2 pill on Sundays 

## 2015-11-24 NOTE — Progress Notes (Signed)
Pre visit review using our clinic review tool, if applicable. No additional management support is needed unless otherwise documented below in the visit note. 

## 2015-12-01 ENCOUNTER — Encounter: Payer: Self-pay | Admitting: Gynecology

## 2016-01-02 ENCOUNTER — Encounter: Payer: Self-pay | Admitting: Gynecology

## 2016-01-02 ENCOUNTER — Other Ambulatory Visit: Payer: Self-pay | Admitting: *Deleted

## 2016-01-02 ENCOUNTER — Ambulatory Visit (INDEPENDENT_AMBULATORY_CARE_PROVIDER_SITE_OTHER): Payer: BLUE CROSS/BLUE SHIELD | Admitting: Gynecology

## 2016-01-02 VITALS — BP 120/74 | Ht 64.0 in | Wt 107.0 lb

## 2016-01-02 DIAGNOSIS — N952 Postmenopausal atrophic vaginitis: Secondary | ICD-10-CM | POA: Diagnosis not present

## 2016-01-02 DIAGNOSIS — M8580 Other specified disorders of bone density and structure, unspecified site: Secondary | ICD-10-CM

## 2016-01-02 DIAGNOSIS — Z01419 Encounter for gynecological examination (general) (routine) without abnormal findings: Secondary | ICD-10-CM | POA: Diagnosis not present

## 2016-01-02 MED ORDER — ATORVASTATIN CALCIUM 20 MG PO TABS: ORAL_TABLET | ORAL | Status: DC

## 2016-01-02 NOTE — Progress Notes (Signed)
Ashlee Chavez 1961-04-05 [id number]        55 y.o.  G0P0  for annual exam.   Doing well without complaints  Past medical history,surgical history, problem list, medications, allergies, family history and social history were all reviewed and documented as reviewed in the EPIC chart.  ROS:  Performed with pertinent positives and negatives included in the history, assessment and plan.   Additional significant findings :  none   Exam: Kennon Portela assistant Filed Vitals:   01/02/16 1553  BP: 120/74  Height:  (1.626 m)  Weight: 107 lb (48.535 kg)   General appearance:  Normal affect, orientation and appearance. Skin: Grossly normal HEENT: Without gross lesions.  No cervical or supraclavicular adenopathy. Thyroid normal.  Lungs:  Clear without wheezing, rales or rhonchi Cardiac: RR, without RMG Abdominal:  Soft, nontender, without masses, guarding, rebound, organomegaly or hernia Breasts:  Examined lying and sitting without masses, retractions, discharge or axillary adenopathy. Pelvic:  Ext/BUS/vagina with atrophic changes. Constriction band upper vagina before cervix  Cervix with atrophic changes  Uterus anteverted, normal size, shape and contour, midline and mobile nontender   Adnexa without masses or tenderness    Anus and perineum normal   Rectovaginal normal sphincter tone without palpated masses or tenderness.    Assessment/Plan:  55 y.o. G0P0 female for annual exam.   1. Postmenopausal/atrophic genital changes.  Without significant hot flashes, night sweats, vaginal dryness or any vaginal bleeding. Not sexually active. Does have a stable construction and upper vagina. Continue to monitor and report any issues or any vaginal bleeding. 2. Pap smear/HPV negative 10/2012. No Pap smear done today.LGSIL 2010 with normal Pap smears afterwards. Plan repeat Pap smear next year 3. Mammography 11/2015. Continue with annual mammography when due. SBE monthly reviewed. 4. Colonoscopy  2012. Repeat at their recommended interval. 5. Osteopenia. DEXA 12/2013 T score -1.8 FRAX 6%/0.7%. Patient has repeat DEXA scheduled. Increased calcium and vitamin D. 6. Health maintenance. No routine blood work done as this is done at her primary physician's office. Follow year, sooner as needed.   Dara Lords MD, 4:11 PM 01/02/2016

## 2016-01-02 NOTE — Patient Instructions (Signed)

## 2016-01-10 DIAGNOSIS — M810 Age-related osteoporosis without current pathological fracture: Secondary | ICD-10-CM

## 2016-01-10 HISTORY — DX: Age-related osteoporosis without current pathological fracture: M81.0

## 2016-01-11 ENCOUNTER — Ambulatory Visit (INDEPENDENT_AMBULATORY_CARE_PROVIDER_SITE_OTHER): Payer: BLUE CROSS/BLUE SHIELD

## 2016-01-11 ENCOUNTER — Encounter: Payer: Self-pay | Admitting: Gynecology

## 2016-01-11 ENCOUNTER — Telehealth: Payer: Self-pay | Admitting: Gynecology

## 2016-01-11 ENCOUNTER — Other Ambulatory Visit: Payer: Self-pay | Admitting: Gynecology

## 2016-01-11 DIAGNOSIS — Z13820 Encounter for screening for osteoporosis: Secondary | ICD-10-CM

## 2016-01-11 DIAGNOSIS — M810 Age-related osteoporosis without current pathological fracture: Secondary | ICD-10-CM | POA: Diagnosis not present

## 2016-01-11 DIAGNOSIS — M8589 Other specified disorders of bone density and structure, multiple sites: Secondary | ICD-10-CM

## 2016-01-11 DIAGNOSIS — M8580 Other specified disorders of bone density and structure, unspecified site: Secondary | ICD-10-CM

## 2016-01-11 NOTE — Telephone Encounter (Signed)
Tell patient that her bone density has progressed to osteoporosis.  She is young to be doing this. My recommendation would be to follow up with Dr. Lucianne Muss who is her endocrinologist for further evaluation and treatment in reference to this. I could order some testing but I think given his expertise in this area as an endocrinologist I will allow him to assume her management as he is comfortable.

## 2016-01-12 NOTE — Telephone Encounter (Signed)
Left message for pt to call.

## 2016-01-12 NOTE — Telephone Encounter (Signed)
Pt informed with the below note. 

## 2016-01-29 ENCOUNTER — Ambulatory Visit (INDEPENDENT_AMBULATORY_CARE_PROVIDER_SITE_OTHER): Payer: BLUE CROSS/BLUE SHIELD | Admitting: Endocrinology

## 2016-01-29 ENCOUNTER — Encounter: Payer: Self-pay | Admitting: Endocrinology

## 2016-01-29 VITALS — BP 110/66 | HR 100 | Temp 97.6°F | Resp 14 | Ht 63.5 in | Wt 102.8 lb

## 2016-01-29 DIAGNOSIS — R634 Abnormal weight loss: Secondary | ICD-10-CM | POA: Diagnosis not present

## 2016-01-29 DIAGNOSIS — M810 Age-related osteoporosis without current pathological fracture: Secondary | ICD-10-CM | POA: Diagnosis not present

## 2016-01-29 DIAGNOSIS — E7800 Pure hypercholesterolemia, unspecified: Secondary | ICD-10-CM | POA: Diagnosis not present

## 2016-01-29 DIAGNOSIS — E063 Autoimmune thyroiditis: Secondary | ICD-10-CM

## 2016-01-29 DIAGNOSIS — E039 Hypothyroidism, unspecified: Secondary | ICD-10-CM | POA: Diagnosis not present

## 2016-01-29 LAB — COMPREHENSIVE METABOLIC PANEL
ALK PHOS: 52 U/L (ref 39–117)
ALT: 17 U/L (ref 0–35)
AST: 24 U/L (ref 0–37)
Albumin: 4.5 g/dL (ref 3.5–5.2)
BUN: 23 mg/dL (ref 6–23)
CO2: 22 mEq/L (ref 19–32)
Calcium: 10.1 mg/dL (ref 8.4–10.5)
Chloride: 104 mEq/L (ref 96–112)
Creatinine, Ser: 0.98 mg/dL (ref 0.40–1.20)
GFR: 62.33 mL/min (ref >60.00)
GLUCOSE: 92 mg/dL (ref 70–99)
POTASSIUM: 4.2 meq/L (ref 3.5–5.1)
SODIUM: 138 meq/L (ref 135–145)
TOTAL PROTEIN: 7.3 g/dL (ref 6.0–8.3)
Total Bilirubin: 0.3 mg/dL (ref 0.2–1.2)

## 2016-01-29 LAB — CBC WITH DIFFERENTIAL/PLATELET
BASOS PCT: 0.5 % (ref 0.0–3.0)
Basophils Absolute: 0 10*3/uL (ref 0.0–0.1)
EOS ABS: 0.3 10*3/uL (ref 0.0–0.7)
Eosinophils Relative: 4.4 % (ref 0.0–5.0)
HEMATOCRIT: 46.2 % — AB (ref 36.0–46.0)
HEMOGLOBIN: 15.2 g/dL — AB (ref 12.0–15.0)
LYMPHS PCT: 22.7 % (ref 12.0–46.0)
Lymphs Abs: 1.3 10*3/uL (ref 0.7–4.0)
MCHC: 33 g/dL (ref 30.0–36.0)
MCV: 95.3 fl (ref 78.0–100.0)
MONOS PCT: 7.4 % (ref 3.0–12.0)
Monocytes Absolute: 0.4 10*3/uL (ref 0.1–1.0)
NEUTROS ABS: 3.8 10*3/uL (ref 1.4–7.7)
Neutrophils Relative %: 65 % (ref 43.0–77.0)
Platelets: 232 10*3/uL (ref 150.0–400.0)
RBC: 4.85 Mil/uL (ref 3.87–5.11)
RDW: 14.7 % (ref 11.5–15.5)
WBC: 5.9 10*3/uL (ref 4.0–10.5)

## 2016-01-29 LAB — T4, FREE: FREE T4: 0.74 ng/dL (ref 0.60–1.60)

## 2016-01-29 LAB — VITAMIN D 25 HYDROXY (VIT D DEFICIENCY, FRACTURES): VITD: 30.57 ng/mL (ref 30.00–100.00)

## 2016-01-29 LAB — LIPID PANEL
Cholesterol: 206 mg/dL — ABNORMAL HIGH (ref 0–200)
HDL: 76.7 mg/dL (ref >39.00)
LDL CALC: 108 mg/dL — AB (ref 0–99)
NONHDL: 128.96
Total CHOL/HDL Ratio: 3
Triglycerides: 104 mg/dL (ref 0.0–149.0)
VLDL: 20.8 mg/dL (ref 0.0–40.0)

## 2016-01-29 LAB — TSH: TSH: 6.56 u[IU]/mL — ABNORMAL HIGH (ref 0.35–4.50)

## 2016-01-29 NOTE — Patient Instructions (Addendum)
Calcium: Oscal or Citracal with D  Consider Reclast annually

## 2016-01-29 NOTE — Progress Notes (Signed)
Patient ID: Ashlee Chavez, female   DOB: 1961-06-20, 55 y.o.   MRN: [id number]           Chief complaint: Treatment of osteoporosis  History of Present Illness:  The patient is referred here for management of her recently diagnosed osteoporosis  She had a screening bone density done and this showed the following T-scores: Femoral neck: -2.7, previously -1.8 Spine: -1.9 FRAX fracture risk at the hip is not calculated  By comparison she has had a decline of 8-10 % in her bone density from 2 years before and 15% result from baseline She had a normal vitamin D level in 2015 and is only taking calcium and vitamin D supplements in her multivitamin  She could not afford Evista and is not on Actonel either because of cost even though it is Generic  She has no history of low trauma fracture  Appears to have a half inch loss and height No symptoms of back pain Age at menopause: 24   LABS:  Lab Results  Component Value Date   VD25OH 46 01/14/2014    HYPOTHYROIDISM:  She had a goiter diagnosed in 1996. She had a positive peroxidase antibody and has been on thyroxine since 1996  Her TSH was high in 2006 and she had symptoms of fatigue, cold intolerance and some hair loss  Since then has been on 75 mcg of Synthroid with occasional adjustments in the dose  Since 01/2014 she had been taking the same dose of levothyroxine, 75 g daily  Her TSH was higher than usual in 11/16 and she was having complaints about depression, insomnia, fatigue and more difficulties with her anxiety  Has had chronic cold sensitivity   Her dose was increased in 1/17 to take an extra half a tablet in addition to daily 88 g  Compliance with the medical regimen has been good with taking the tablet in the morning before breakfast.  Does take her multivitamin separately   Lab Results  Component Value Date   TSH 6.56* 01/29/2016   TSH 5.03* 11/22/2015   TSH 9.68* 09/20/2015   FREET4 0.74 01/29/2016   FREET4  0.44* 09/20/2015   FREET4 0.58* 03/20/2015    OTHER active problems discussed and review of systems  Past Medical History  Diagnosis Date  . LGSIL (low grade squamous intraepithelial dysplasia) 2010  . OCD (obsessive compulsive disorder)   . Migraines   . Hypercholesteremia   . Osteoporosis 01/2016    T score -2.7  . Hypothyroid     Past Surgical History  Procedure Laterality Date  . Cataract extraction  2005  . Breast lumpectomy  2001    right-benign    Family History  Problem Relation Age of Onset  . Hypertension Mother   . Heart disease Mother   . Hypertension Father   . Breast cancer Paternal Aunt     Age 45    Social History:  reports that she has never smoked. She has never used smokeless tobacco. She reports that she drinks alcohol. She reports that she does not use illicit drugs.  Allergies:  Allergies  Allergen Reactions  . Ampicillin Rash      Medication List       This list is accurate as of: 01/29/16  8:33 AM.  Always use your most recent med list.               ALPRAZolam 0.25 MG tablet  Commonly known as:  XANAX  Take 0.25 mg  by mouth daily as needed for anxiety.     atorvastatin 20 MG tablet  Commonly known as:  LIPITOR  TAKE ONE TABLET BY MOUTH ONCE DAILY     levothyroxine 88 MCG tablet  Commonly known as:  SYNTHROID, LEVOTHROID  Take 1 tablet (88 mcg total) by mouth daily.     methylphenidate 5 MG tablet  Commonly known as:  RITALIN  Take 5 mg by mouth 2 (two) times daily.     PARoxetine 40 MG tablet  Commonly known as:  PAXIL     ranitidine 75 MG tablet  Commonly known as:  ZANTAC  Take 75 mg by mouth daily as needed for heartburn.     RESTASIS 0.05 % ophthalmic emulsion  Generic drug:  cycloSPORINE     SUMAtriptan 100 MG tablet  Commonly known as:  IMITREX  Take 100 mg by mouth daily as needed for migraine.     topiramate 100 MG tablet  Commonly known as:  TOPAMAX  Take 50 mg by mouth 2 (two) times daily. 50 mg in the  morning 100 mg at night         Review of Systems  Constitutional: Positive for weight loss and reduced appetite.   She has been on Topamax for quite some time and does not think this is causing weight loss.  She has decreased appetite of unclear etiology.  No abdominal pain, nausea or diarrhea.  Wt Readings from Last 3 Encounters:  01/29/16 102 lb 12.8 oz (46.63 kg)  01/02/16 107 lb (48.535 kg)  11/24/15 107 lb 6.4 oz (48.716 kg)   History of frequent migraines, taking has been fairly regularly  LABS:  No visits with results within 1 Week(s) from this visit. Latest known visit with results is:  Lab on 11/22/2015  Component Date Value Ref Range Status  . TSH 11/22/2015 5.03* 0.35 - 4.50 uIU/mL Final     PHYSICAL EXAM:  BP 110/66 mmHg  Pulse 100  Temp(Src) 97.6 F (36.4 C)  Resp 14  Ht  (1.626 m)  Wt 102 lb 12.8 oz (46.63 kg)  BMI 17.64 kg/m2  SpO2 95%  LMP 11/19/2010  No pallor present. Biceps reflexes appear normal  ASSESSMENT:   OSTEOPOROSIS: Showing some progression over the last few years, likely to be postmenopausal but need to rule out obvious secondary causes with chemistry panel and CBC She may have had a half inch height loss Does need pharmacological treatment as well as assessment of vitamin D level Does not take any calcium supplement, needs to start Discussed various treatment options for osteoporosis and need for long-term treatment  WEIGHT loss: Has decreased appetite of unclear etiology, no systemic symptoms  Depression: Fairly well controlled  Hypothyroidism: Slight increase in doses were made on the last visit and needs follow-up  History of hyperlipidemia: To have follow-up done with continued Lipitor  History of aspirin usage: She is taking has been fairly frequently for headaches along with Zantac   PLAN:    IV Reclast to be done by the RN, she wants to check on the out-of-pocket expense first compared to Actonel  Check  vitamin D level  Check chemistry, CBC and recheck thyroid  Calcium and vitamin D supplements OTC  Follow-up bone density every 2 years  Suggest using coated aspirin instead of regular aspirin powder for headaches  Recommend establishing with primary care  physician for detailed evaluation of her weight loss  Castella Lerner 01/29/2016, 8:33 AM   Addendum: Labs normal  except TSH still high, will have her switch Synthroid to 112 g daily

## 2016-01-30 ENCOUNTER — Other Ambulatory Visit: Payer: Self-pay | Admitting: *Deleted

## 2016-01-30 MED ORDER — LEVOTHYROXINE SODIUM 112 MCG PO TABS: 112.0000 ug | ORAL_TABLET | Freq: Every day | ORAL | Status: DC

## 2016-01-30 NOTE — Progress Notes (Signed)
Quick Note:  Please let patient know that the lab results are okay except thyroid is lower than before,   Labs normal except TSH still high, will have her switch Synthroid to 112 g daily ______

## 2016-02-08 ENCOUNTER — Telehealth: Payer: Self-pay

## 2016-02-08 NOTE — Telephone Encounter (Signed)
lmom for patient regarding flu shot survey/call 

## 2016-02-17 DIAGNOSIS — F422 Mixed obsessional thoughts and acts: Secondary | ICD-10-CM | POA: Diagnosis not present

## 2016-03-11 DIAGNOSIS — F422 Mixed obsessional thoughts and acts: Secondary | ICD-10-CM | POA: Diagnosis not present

## 2016-04-10 DIAGNOSIS — F422 Mixed obsessional thoughts and acts: Secondary | ICD-10-CM | POA: Diagnosis not present

## 2016-04-27 DIAGNOSIS — F422 Mixed obsessional thoughts and acts: Secondary | ICD-10-CM | POA: Diagnosis not present

## 2016-04-30 DIAGNOSIS — G43719 Chronic migraine without aura, intractable, without status migrainosus: Secondary | ICD-10-CM | POA: Diagnosis not present

## 2016-04-30 DIAGNOSIS — G43019 Migraine without aura, intractable, without status migrainosus: Secondary | ICD-10-CM | POA: Diagnosis not present

## 2016-05-10 DIAGNOSIS — F422 Mixed obsessional thoughts and acts: Secondary | ICD-10-CM | POA: Diagnosis not present

## 2016-05-20 ENCOUNTER — Other Ambulatory Visit (INDEPENDENT_AMBULATORY_CARE_PROVIDER_SITE_OTHER): Payer: BLUE CROSS/BLUE SHIELD

## 2016-05-20 DIAGNOSIS — M810 Age-related osteoporosis without current pathological fracture: Secondary | ICD-10-CM

## 2016-05-20 DIAGNOSIS — E039 Hypothyroidism, unspecified: Secondary | ICD-10-CM

## 2016-05-20 DIAGNOSIS — E063 Autoimmune thyroiditis: Secondary | ICD-10-CM

## 2016-05-20 LAB — BASIC METABOLIC PANEL
BUN: 22 mg/dL (ref 6–23)
CALCIUM: 9.9 mg/dL (ref 8.4–10.5)
CHLORIDE: 106 meq/L (ref 96–112)
CO2: 25 meq/L (ref 19–32)
CREATININE: 0.96 mg/dL (ref 0.40–1.20)
GFR: 63.76 mL/min (ref >60.00)
GLUCOSE: 87 mg/dL (ref 70–99)
Potassium: 4.3 mEq/L (ref 3.5–5.1)
Sodium: 139 mEq/L (ref 135–145)

## 2016-05-20 LAB — TSH: TSH: 4.81 u[IU]/mL — ABNORMAL HIGH (ref 0.35–4.50)

## 2016-05-23 ENCOUNTER — Ambulatory Visit (INDEPENDENT_AMBULATORY_CARE_PROVIDER_SITE_OTHER): Payer: BLUE CROSS/BLUE SHIELD | Admitting: Endocrinology

## 2016-05-23 ENCOUNTER — Encounter: Payer: Self-pay | Admitting: Endocrinology

## 2016-05-23 VITALS — BP 110/78 | HR 101 | Ht 63.0 in | Wt 114.0 lb

## 2016-05-23 DIAGNOSIS — E038 Other specified hypothyroidism: Secondary | ICD-10-CM

## 2016-05-23 DIAGNOSIS — E063 Autoimmune thyroiditis: Secondary | ICD-10-CM

## 2016-05-23 MED ORDER — RISEDRONATE SODIUM 150 MG PO TABS: 150.0000 mg | ORAL_TABLET | ORAL | Status: DC

## 2016-05-23 MED ORDER — LEVOTHYROXINE SODIUM 125 MCG PO TABS: 125.0000 ug | ORAL_TABLET | Freq: Every day | ORAL | Status: DC

## 2016-05-23 NOTE — Progress Notes (Signed)
Patient ID: Ashlee Chavez, female   DOB: 07/03/1961, 55 y.o.   MRN: [id number]           Chief complaint: Treatment of hypothyroidism and osteoporosis  History of Present Illness:   She had a screening bone density done and this showed the following T-scores: Femoral neck: -2.7, previously -1.8 Spine: -1.9 FRAX fracture risk at the hip is not calculated  By comparison she has had a decline of 8-10 % in her bone density from 2 years before and 15% result from baseline She had a normal vitamin D level in 3/17 She also has a half inch height loss  She is taking calcium and vitamin D supplements with Os-Cal She could not afford Evista and did not want to consider Reclast because of the possible high one time cost and is now here for follow-up without any treatment being started, she did not let us know about not starting Reclast    LABS:  Lab Results  Component Value Date   VD25OH 30.57 01/29/2016   VD25OH 46 01/14/2014    HYPOTHYROIDISM:  She had a goiter diagnosed in 1996. She had a positive peroxidase antibody and has been on thyroxine since 1996  Her TSH was high in 2006 and she had symptoms of fatigue, cold intolerance and some hair loss  Since then has been on 75 mcg of Synthroid with occasional adjustments in the dose  Since 01/2014 she had been taking the same dose of levothyroxine, 75 g daily  Her TSH was higher than usual in 11/16 and she was having complaints about depression, insomnia, fatigue and more difficulties with her anxiety  Has had chronic cold sensitivity   Her dose was increased in 3/17 up to 112 g With this she is having less fatigue although still not quite normal  Compliance with the medical regimen has been good with taking the tablet in the morning before breakfast.  Does take her multivitamin and calcium separately   Lab Results  Component Value Date   TSH 4.81* 05/20/2016   TSH 6.56* 01/29/2016   TSH 5.03* 11/22/2015   FREET4 0.74  01/29/2016   FREET4 0.44* 09/20/2015   FREET4 0.58* 03/20/2015    OTHER active problems discussed and review of systems  Past Medical History  Diagnosis Date  . LGSIL (low grade squamous intraepithelial dysplasia) 2010  . OCD (obsessive compulsive disorder)   . Migraines   . Hypercholesteremia   . Osteoporosis 01/2016    T score -2.7  . Hypothyroid     Past Surgical History  Procedure Laterality Date  . Cataract extraction  2005  . Breast lumpectomy  2001    right-benign    Family History  Problem Relation Age of Onset  . Hypertension Mother   . Heart disease Mother   . Hypertension Father   . Breast cancer Paternal Aunt     Age 57    Social History:  reports that she has never smoked. She has never used smokeless tobacco. She reports that she drinks alcohol. She reports that she does not use illicit drugs.  Allergies:  Allergies  Allergen Reactions  . Ampicillin Rash      Medication List       This list is accurate as of: 05/23/16  9:56 AM.  Always use your most recent med list.               ALPRAZolam 0.25 MG tablet  Commonly known as:  XANAX  Take 0.25  mg by mouth daily as needed for anxiety.     atorvastatin 20 MG tablet  Commonly known as:  LIPITOR  TAKE ONE TABLET BY MOUTH ONCE DAILY     levothyroxine 125 MCG tablet  Commonly known as:  SYNTHROID, LEVOTHROID  Take 1 tablet (125 mcg total) by mouth daily.     methylphenidate 5 MG tablet  Commonly known as:  RITALIN  Take 5 mg by mouth 2 (two) times daily.     PARoxetine 40 MG tablet  Commonly known as:  PAXIL     ranitidine 75 MG tablet  Commonly known as:  ZANTAC  Take 75 mg by mouth daily as needed for heartburn.     RESTASIS 0.05 % ophthalmic emulsion  Generic drug:  cycloSPORINE     risedronate 150 MG tablet  Commonly known as:  ACTONEL  Take 1 tablet (150 mg total) by mouth every 30 (thirty) days. with water on empty stomach, nothing by mouth or lie down for next 30 minutes.       SUMAtriptan 100 MG tablet  Commonly known as:  IMITREX  Take 100 mg by mouth daily as needed for migraine.     topiramate 100 MG tablet  Commonly known as:  TOPAMAX  Take 50 mg by mouth 2 (two) times daily. 50 mg in the morning 100 mg at night         Review of Systems  Constitutional: Positive for weight loss and reduced appetite.   She has been on Topamax for quite some time  Her weight has improved with starting Remeron from psychiatrist   Wt Readings from Last 3 Encounters:  05/23/16 114 lb (51.71 kg)  01/29/16 102 lb 12.8 oz (46.63 kg)  01/02/16 107 lb (48.535 kg)    LABS:  Lab on 05/20/2016  Component Date Value Ref Range Status  . TSH 05/20/2016 4.81* 0.35 - 4.50 uIU/mL Final  . Sodium 05/20/2016 139  135 - 145 mEq/L Final  . Potassium 05/20/2016 4.3  3.5 - 5.1 mEq/L Final  . Chloride 05/20/2016 106  96 - 112 mEq/L Final  . CO2 05/20/2016 25  19 - 32 mEq/L Final  . Glucose, Bld 05/20/2016 87  70 - 99 mg/dL Final  . BUN 16/08/9603 22  6 - 23 mg/dL Final  . Creatinine, Ser 05/20/2016 0.96  0.40 - 1.20 mg/dL Final  . Calcium 54/07/8118 9.9  8.4 - 10.5 mg/dL Final  . GFR 14/78/2956 63.76  >60.00 mL/min Final     PHYSICAL EXAM:  BP 110/78 mmHg  Pulse 101  Ht  (1.6 m)  Wt 114 lb (51.71 kg)  BMI 20.20 kg/m2  SpO2 97%  LMP 11/19/2010  Right thyroid lobe is firm and slightly irregular, about 2- 2.5 Times normal Biceps reflexes are slightly brisk  ASSESSMENT:   OSTEOPOROSIS: Showing some progression over the last few years She agrees to start Actonel now  WEIGHT loss: Has resolved with Remeron  Depression: Fairly well controlled  Hypothyroidism: Slight increase in TSH still present with 112 g dosage and she has some nonspecific fatigue again   PLAN:    Actonel 150 mg weekly  Synthroid 125 g daily  Follow-up in 2 months  Ashlee Chavez 05/23/2016, 9:56 AM

## 2016-05-25 DIAGNOSIS — F422 Mixed obsessional thoughts and acts: Secondary | ICD-10-CM | POA: Diagnosis not present

## 2016-06-22 ENCOUNTER — Other Ambulatory Visit: Payer: Self-pay | Admitting: Endocrinology

## 2016-06-29 DIAGNOSIS — F422 Mixed obsessional thoughts and acts: Secondary | ICD-10-CM | POA: Diagnosis not present

## 2016-07-19 ENCOUNTER — Other Ambulatory Visit (INDEPENDENT_AMBULATORY_CARE_PROVIDER_SITE_OTHER): Payer: BLUE CROSS/BLUE SHIELD

## 2016-07-19 DIAGNOSIS — E063 Autoimmune thyroiditis: Secondary | ICD-10-CM

## 2016-07-19 DIAGNOSIS — E038 Other specified hypothyroidism: Secondary | ICD-10-CM | POA: Diagnosis not present

## 2016-07-19 LAB — TSH: TSH: 4.27 u[IU]/mL (ref 0.35–4.50)

## 2016-07-19 LAB — T4, FREE: Free T4: 0.63 ng/dL (ref 0.60–1.60)

## 2016-07-24 ENCOUNTER — Ambulatory Visit (INDEPENDENT_AMBULATORY_CARE_PROVIDER_SITE_OTHER): Payer: BLUE CROSS/BLUE SHIELD | Admitting: Endocrinology

## 2016-07-24 ENCOUNTER — Encounter: Payer: Self-pay | Admitting: Endocrinology

## 2016-07-24 VITALS — BP 106/68 | HR 94 | Temp 98.0°F | Resp 14 | Ht 63.0 in | Wt 115.8 lb

## 2016-07-24 DIAGNOSIS — E7800 Pure hypercholesterolemia, unspecified: Secondary | ICD-10-CM

## 2016-07-24 DIAGNOSIS — E038 Other specified hypothyroidism: Secondary | ICD-10-CM

## 2016-07-24 DIAGNOSIS — M810 Age-related osteoporosis without current pathological fracture: Secondary | ICD-10-CM | POA: Diagnosis not present

## 2016-07-24 DIAGNOSIS — E063 Autoimmune thyroiditis: Secondary | ICD-10-CM

## 2016-07-24 NOTE — Patient Instructions (Signed)
Take Vitamin D3, 1000 units daily 

## 2016-07-24 NOTE — Progress Notes (Signed)
Patient ID: Ashlee Chavez, female   DOB: 09/02/1961, 55 y.o.   MRN: [id number]           Chief complaint: Treatment of hypothyroidism and osteoporosis  History of Present Illness:  OSTEOPOROSIS: She had a screening bone density done and this showed the following T-scores: Femoral neck: -2.7, previously -1.8 Spine: -1.9 FRAX fracture risk at the hip is not calculated  By comparison she has had a decline of 8-10 % in her bone density from 2 years before and 15% result from baseline She had a normal vitamin D level in 3/17 She also has a half inch height loss  She is taking calcium and vitamin D supplements with Os-Cal She could not afford Evista and did not want to consider Reclast because of the possible high one time cost In July she was recommended starting treatment with Actonel which she thinks is affordable She has had no GI side effects and is taking this once a month regularly now  Not taking any separate vitamin D    LABS:  Lab Results  Component Value Date   VD25OH 30.57 01/29/2016   VD25OH 46 01/14/2014    HYPOTHYROIDISM:  She had a goiter diagnosed in 1996. She had a positive peroxidase antibody and has been on thyroxine since 1996  Her TSH was high in 2006 and she had symptoms of fatigue, cold intolerance and some hair loss  Since then has been on 75 mcg of Synthroid with occasional adjustments in the dose  Since 01/2014 she had been taking the same dose of levothyroxine, 75 g daily  Her TSH was higher than usual in 11/16 and she was having complaints about depression, insomnia, fatigue and more difficulties with her anxiety  Has had chronic cold sensitivity   Her dose was increased in 3/17 up to 112 g and in 7/17 it was increased to 125 g  With this she is having less fatigue although still gets tired She thinks that she is not needing to take naps on weekends now  Compliance with the medical regimen has been good with taking the tablet in the morning  before breakfast.  Does take her multivitamin and calcium separately   Lab Results  Component Value Date   TSH 4.27 07/19/2016   TSH 4.81 (H) 05/20/2016   TSH 6.56 (H) 01/29/2016   FREET4 0.63 07/19/2016   FREET4 0.74 01/29/2016   FREET4 0.44 (L) 09/20/2015    OTHER active problems discussed and review of systems  Past Medical History:  Diagnosis Date  . Hypercholesteremia   . Hypothyroid   . LGSIL (low grade squamous intraepithelial dysplasia) 2010  . Migraines   . OCD (obsessive compulsive disorder)   . Osteoporosis 01/2016   T score -2.7    Past Surgical History:  Procedure Laterality Date  . BREAST LUMPECTOMY  2001   right-benign  . CATARACT EXTRACTION  2005    Family History  Problem Relation Age of Onset  . Hypertension Mother   . Heart disease Mother   . Hypertension Father   . Breast cancer Paternal Aunt     Age 57    Social History:  reports that she has never smoked. She has never used smokeless tobacco. She reports that she drinks alcohol. She reports that she does not use drugs.  Allergies:  Allergies  Allergen Reactions  . Ampicillin Rash      Medication List       Accurate as of 07/24/16  8:10 AM.  Always use your most recent med list.          ALPRAZolam 0.25 MG tablet Commonly known as:  XANAX Take 0.25 mg by mouth daily as needed for anxiety.   atorvastatin 20 MG tablet Commonly known as:  LIPITOR TAKE ONE TABLET BY MOUTH ONCE DAILY   levothyroxine 125 MCG tablet Commonly known as:  SYNTHROID, LEVOTHROID Take 1 tablet (125 mcg total) by mouth daily.   methylphenidate 5 MG tablet Commonly known as:  RITALIN Take 5 mg by mouth 2 (two) times daily.   methylphenidate 10 MG tablet Commonly known as:  RITALIN Take 10 mg by mouth 3 (three) times daily.   mirtazapine 30 MG tablet Commonly known as:  REMERON Take 30 mg by mouth at bedtime.   PARoxetine 40 MG tablet Commonly known as:  PAXIL   ranitidine 75 MG tablet Commonly  known as:  ZANTAC Take 75 mg by mouth daily as needed for heartburn.   RESTASIS 0.05 % ophthalmic emulsion Generic drug:  cycloSPORINE   risedronate 150 MG tablet Commonly known as:  ACTONEL Take 1 tablet (150 mg total) by mouth every 30 (thirty) days. with water on empty stomach, nothing by mouth or lie down for next 30 minutes.   SUMAtriptan 100 MG tablet Commonly known as:  IMITREX Take 100 mg by mouth daily as needed for migraine.   topiramate 100 MG tablet Commonly known as:  TOPAMAX Take 50 mg by mouth 2 (two) times daily. 50 mg in the morning 100 mg at night        Review of Systems   Her weight has improved with  Remeron from psychiatrist   Wt Readings from Last 3 Encounters:  07/24/16 115 lb 12.8 oz (52.5 kg)  05/23/16 114 lb (51.7 kg)  01/29/16 102 lb 12.8 oz (46.6 kg)   HYPERCHOLESTEROLEMIA: She has been on Lipitor  Lab Results  Component Value Date   CHOL 206 (H) 01/29/2016   HDL 76.70 01/29/2016   LDLCALC 108 (H) 01/29/2016   TRIG 104.0 01/29/2016   CHOLHDL 3 01/29/2016      PHYSICAL EXAM:  BP 106/68   Pulse 94   Temp 98 F (36.7 C)   Resp 14   Ht 5\' 3"  (1.6 m)   Wt 115 lb 12.8 oz (52.5 kg)   LMP 11/19/2010   SpO2 95%   BMI 20.51 kg/m   Right thyroid lobe is slightly firm and about 1-1/2-2 times normal, left side just palpable and firm  ASSESSMENT:   OSTEOPOROSIS: History of some progression over the last few years, now has been on Actonel for 2 months with no side effects  Hypothyroidism: High normal TSH was subjectively she is doing well Her thyroid enlargement is better now   PLAN:    Actonel 150 mg weekly to be continued  Synthroid 125 g daily as before  Follow-up in 4 months  Vitamin D 1000 units daily in addition  Ashlee Chavez 07/24/2016, 8:10 AM

## 2016-08-28 DIAGNOSIS — F422 Mixed obsessional thoughts and acts: Secondary | ICD-10-CM | POA: Diagnosis not present

## 2016-08-29 DIAGNOSIS — F422 Mixed obsessional thoughts and acts: Secondary | ICD-10-CM | POA: Diagnosis not present

## 2016-09-21 DIAGNOSIS — F422 Mixed obsessional thoughts and acts: Secondary | ICD-10-CM | POA: Diagnosis not present

## 2016-09-30 ENCOUNTER — Other Ambulatory Visit: Payer: Self-pay | Admitting: Endocrinology

## 2016-10-28 DIAGNOSIS — G43719 Chronic migraine without aura, intractable, without status migrainosus: Secondary | ICD-10-CM | POA: Diagnosis not present

## 2016-10-28 DIAGNOSIS — G43019 Migraine without aura, intractable, without status migrainosus: Secondary | ICD-10-CM | POA: Diagnosis not present

## 2016-11-19 ENCOUNTER — Other Ambulatory Visit (INDEPENDENT_AMBULATORY_CARE_PROVIDER_SITE_OTHER): Payer: BLUE CROSS/BLUE SHIELD

## 2016-11-19 DIAGNOSIS — M810 Age-related osteoporosis without current pathological fracture: Secondary | ICD-10-CM | POA: Diagnosis not present

## 2016-11-19 DIAGNOSIS — E038 Other specified hypothyroidism: Secondary | ICD-10-CM | POA: Diagnosis not present

## 2016-11-19 DIAGNOSIS — E063 Autoimmune thyroiditis: Secondary | ICD-10-CM

## 2016-11-19 DIAGNOSIS — E7800 Pure hypercholesterolemia, unspecified: Secondary | ICD-10-CM

## 2016-11-19 LAB — COMPREHENSIVE METABOLIC PANEL
ALT: 20 U/L (ref 0–35)
AST: 30 U/L (ref 0–37)
Albumin: 4.1 g/dL (ref 3.5–5.2)
Alkaline Phosphatase: 42 U/L (ref 39–117)
BUN: 17 mg/dL (ref 6–23)
CHLORIDE: 108 meq/L (ref 96–112)
CO2: 26 meq/L (ref 19–32)
Calcium: 9.8 mg/dL (ref 8.4–10.5)
Creatinine, Ser: 0.9 mg/dL (ref 0.40–1.20)
GFR: 68.57 mL/min (ref >60.00)
GLUCOSE: 94 mg/dL (ref 70–99)
POTASSIUM: 4.3 meq/L (ref 3.5–5.1)
Sodium: 141 mEq/L (ref 135–145)
Total Bilirubin: 0.2 mg/dL (ref 0.2–1.2)
Total Protein: 6.8 g/dL (ref 6.0–8.3)

## 2016-11-19 LAB — LIPID PANEL
Cholesterol: 188 mg/dL (ref 0–200)
HDL: 66 mg/dL (ref >39.00)
LDL CALC: 106 mg/dL — AB (ref 0–99)
NonHDL: 121.81
Total CHOL/HDL Ratio: 3
Triglycerides: 81 mg/dL (ref 0.0–149.0)
VLDL: 16.2 mg/dL (ref 0.0–40.0)

## 2016-11-19 LAB — TSH: TSH: 3.12 u[IU]/mL (ref 0.35–4.50)

## 2016-11-19 LAB — VITAMIN D 25 HYDROXY (VIT D DEFICIENCY, FRACTURES): VITD: 29.16 ng/mL — AB (ref 30.00–100.00)

## 2016-11-21 DIAGNOSIS — F422 Mixed obsessional thoughts and acts: Secondary | ICD-10-CM | POA: Diagnosis not present

## 2016-11-22 ENCOUNTER — Encounter: Payer: Self-pay | Admitting: Endocrinology

## 2016-11-22 ENCOUNTER — Ambulatory Visit (INDEPENDENT_AMBULATORY_CARE_PROVIDER_SITE_OTHER): Payer: BLUE CROSS/BLUE SHIELD | Admitting: Endocrinology

## 2016-11-22 VITALS — BP 112/68 | HR 94 | Ht 63.5 in | Wt 122.0 lb

## 2016-11-22 DIAGNOSIS — F422 Mixed obsessional thoughts and acts: Secondary | ICD-10-CM | POA: Diagnosis not present

## 2016-11-22 DIAGNOSIS — E038 Other specified hypothyroidism: Secondary | ICD-10-CM

## 2016-11-22 DIAGNOSIS — E559 Vitamin D deficiency, unspecified: Secondary | ICD-10-CM

## 2016-11-22 DIAGNOSIS — E7800 Pure hypercholesterolemia, unspecified: Secondary | ICD-10-CM | POA: Diagnosis not present

## 2016-11-22 DIAGNOSIS — E063 Autoimmune thyroiditis: Secondary | ICD-10-CM

## 2016-11-22 NOTE — Patient Instructions (Addendum)
Increase Vitamin D3 to 2000 U  No change in Rx

## 2016-11-22 NOTE — Progress Notes (Signed)
Patient ID: Ashlee Chavez, female   DOB: Apr 25, 1961, 56 y.o.   MRN: [id number]           Chief complaint: Treatment of hypothyroidism and osteoporosis  History of Present Illness:  OSTEOPOROSIS: She had a screening bone density done and this showed the following T-scores: Femoral neck: -2.7, previously -1.8 Spine: -1.9 FRAX fracture risk at the hip is not calculated  By comparison she has had a decline of 8-10 % in her bone density from 2 years before and 15% result from baseline She had a normal vitamin D level in 3/17 She also has a half inch height loss from baseline  She is taking calcium supplements with Os-Cal She could not afford Evista and did not want to consider Reclast because of the possible high one time cost In 7/17 she was recommended starting treatment with Actonel She has had no GI side effects and is taking this once a month regularly   Currently taking 1000 units of vitamin D    LABS:  Lab Results  Component Value Date   VD25OH 29.16 (L) 11/19/2016   VD25OH 30.57 01/29/2016    HYPOTHYROIDISM:  She had a goiter diagnosed in 1996. She had a positive peroxidase antibody and has been on thyroxine since 1996  Her TSH was high in 2006 and she had symptoms of fatigue, cold intolerance and some hair loss  Since then has been on 75 mcg of Synthroid with occasional adjustments in the dose  Since 01/2014 she had been taking the same dose of levothyroxine, 75 g daily  Her TSH was higher than usual in 11/16 and she was having complaints about depression, insomnia, fatigue and more difficulties with her anxiety  Has had chronic cold sensitivity   Her dose was increased in 3/17 up to 112 g and in 7/17 it was increased to 125 g  With this Dose she continues to feel fairly good and has not had as much fatigue and sleepiness  Compliance with the medical regimen has been good with taking the tablet in the morning before breakfast.  Does take her multivitamin and  calcium separately  TSH is slightly improved   Lab Results  Component Value Date   TSH 3.12 11/19/2016   TSH 4.27 07/19/2016   TSH 4.81 (H) 05/20/2016   FREET4 0.63 07/19/2016   FREET4 0.74 01/29/2016   FREET4 0.44 (L) 09/20/2015    OTHER active problems discussed and review of systems  Past Medical History:  Diagnosis Date  . Hypercholesteremia   . Hypothyroid   . LGSIL (low grade squamous intraepithelial dysplasia) 2010  . Migraines   . OCD (obsessive compulsive disorder)   . Osteoporosis 01/2016   T score -2.7    Past Surgical History:  Procedure Laterality Date  . BREAST LUMPECTOMY  2001   right-benign  . CATARACT EXTRACTION  2005    Family History  Problem Relation Age of Onset  . Hypertension Mother   . Heart disease Mother   . Hypertension Father   . Breast cancer Paternal Aunt     Age 44    Social History:  reports that she has never smoked. She has never used smokeless tobacco. She reports that she drinks alcohol. She reports that she does not use drugs.  Allergies:  Allergies  Allergen Reactions  . Ampicillin Rash    Allergies as of 11/22/2016      Reactions   Ampicillin Rash      Medication List  Accurate as of 11/22/16  8:09 AM. Always use your most recent med list.          ALPRAZolam 0.25 MG tablet Commonly known as:  XANAX Take 0.25 mg by mouth daily as needed for anxiety.   atorvastatin 20 MG tablet Commonly known as:  LIPITOR TAKE ONE TABLET BY MOUTH ONCE DAILY   levothyroxine 125 MCG tablet Commonly known as:  SYNTHROID, LEVOTHROID Take 1 tablet (125 mcg total) by mouth daily.   methylphenidate 10 MG tablet Commonly known as:  RITALIN Take 10 mg by mouth 3 (three) times daily.   mirtazapine 30 MG tablet Commonly known as:  REMERON Take 30 mg by mouth at bedtime.   PARoxetine 40 MG tablet Commonly known as:  PAXIL 2 (two) times daily.   ranitidine 75 MG tablet Commonly known as:  ZANTAC Take 1.5 mg by mouth  daily as needed for heartburn.   RESTASIS 0.05 % ophthalmic emulsion Generic drug:  cycloSPORINE   risedronate 150 MG tablet Commonly known as:  ACTONEL TAKE 1 TABLET EVERY 30 DAYS WITH WATER ON EMPTY STOMACH, NOTHING BY MOUTH OR LIE DOWN FOR 30MIN   SUMAtriptan 100 MG tablet Commonly known as:  IMITREX Take 100 mg by mouth daily as needed for migraine.   topiramate 100 MG tablet Commonly known as:  TOPAMAX Take 50 mg by mouth 2 (two) times daily. 50 mg in the morning 100 mg at night        Review of Systems   HYPERCHOLESTEROLEMIA: She has been on Lipitor 20 mg for several years, LDL stable, no other risk factors  Lab Results  Component Value Date   CHOL 188 11/19/2016   HDL 66.00 11/19/2016   LDLCALC 106 (H) 11/19/2016   TRIG 81.0 11/19/2016   CHOLHDL 3 11/19/2016      PHYSICAL EXAM:  BP 112/68   Pulse 94   Ht 5' 3.5" (1.613 m)   Wt 122 lb (55.3 kg)   LMP 11/19/2010   SpO2 99%   BMI 21.27 kg/m   Right thyroid lobe is slightly firm and about 1-1/2-2 times normal, left side and firm  ASSESSMENT:   OSTEOPOROSIS: History of some progression over the last few years,  She now has been on Actonel for this since 7/17  with no side effects  HYPOTHYROIDISM:  Subjectively doing well. TSH is more in the normal range compared to last 2 times with 125 g levothyroxine. She is very compliant with the medication  Long-standing history of HYPERCHOLESTEROLEMIA: LDL is still just over 100, since she had no other risk factors will continue 20 mg for cardiovascular protection   PLAN:    Actonel 150 mg weekly to be continued  Lipitor 20 mg: continue unchanged  Synthroid 125 g daily as before  Follow-up in 12 months  Vitamin D 2000 units daily in addition to her Os-Cal  Ashlee Chavez 11/22/2016, 8:09 AM

## 2016-12-04 ENCOUNTER — Encounter: Payer: Self-pay | Admitting: Gynecology

## 2016-12-04 DIAGNOSIS — Z1231 Encounter for screening mammogram for malignant neoplasm of breast: Secondary | ICD-10-CM | POA: Diagnosis not present

## 2016-12-04 DIAGNOSIS — Z803 Family history of malignant neoplasm of breast: Secondary | ICD-10-CM | POA: Diagnosis not present

## 2016-12-16 ENCOUNTER — Other Ambulatory Visit: Payer: Self-pay | Admitting: Endocrinology

## 2016-12-22 ENCOUNTER — Other Ambulatory Visit: Payer: Self-pay | Admitting: Endocrinology

## 2016-12-28 DIAGNOSIS — F422 Mixed obsessional thoughts and acts: Secondary | ICD-10-CM | POA: Diagnosis not present

## 2017-01-15 ENCOUNTER — Ambulatory Visit (INDEPENDENT_AMBULATORY_CARE_PROVIDER_SITE_OTHER): Payer: BLUE CROSS/BLUE SHIELD | Admitting: Gynecology

## 2017-01-15 ENCOUNTER — Encounter: Payer: Self-pay | Admitting: Gynecology

## 2017-01-15 VITALS — BP 116/74 | Ht 64.0 in | Wt 119.0 lb

## 2017-01-15 DIAGNOSIS — Z01411 Encounter for gynecological examination (general) (routine) with abnormal findings: Secondary | ICD-10-CM | POA: Diagnosis not present

## 2017-01-15 DIAGNOSIS — M810 Age-related osteoporosis without current pathological fracture: Secondary | ICD-10-CM | POA: Diagnosis not present

## 2017-01-15 DIAGNOSIS — Z1151 Encounter for screening for human papillomavirus (HPV): Secondary | ICD-10-CM

## 2017-01-15 DIAGNOSIS — N952 Postmenopausal atrophic vaginitis: Secondary | ICD-10-CM

## 2017-01-15 NOTE — Progress Notes (Signed)
    Helen HashimotoJanet C Sutherlin 04/18/1961 [id number]        56 y.o.  G0P0 for annual exam.    Past medical history,surgical history, problem list, medications, allergies, family history and social history were all reviewed and documented as reviewed in the EPIC chart.  ROS:  Performed with pertinent positives and negatives included in the history, assessment and plan.   Additional significant findings :  None   Exam: Kennon PortelaKim Gardner assistant Vitals:   01/15/17 0825  BP: 116/74  Weight: 119 lb (54 kg)  Height: 5\' 4"  (1.626 m)   Body mass index is 20.43 kg/m.  General appearance:  Normal affect, orientation and appearance. Skin: Grossly normal HEENT: Without gross lesions.  No cervical or supraclavicular adenopathy. Thyroid normal.  Lungs:  Clear without wheezing, rales or rhonchi Cardiac: RR, without RMG Abdominal:  Soft, nontender, without masses, guarding, rebound, organomegaly or hernia Breasts:  Examined lying and sitting without masses, retractions, discharge or axillary adenopathy. Pelvic:  Ext, BUS, Vagina: With atrophic changes. Upper vaginal constriction band noted. Stable from prior exams.  Cervix: Atrophic. Pap smear/HPV  Uterus: Anteverted, normal size, shape and contour, midline and mobile nontender   Adnexa: Without masses or tenderness    Anus and perineum: Normal   Rectovaginal: Normal sphincter tone without palpated masses or tenderness.    Assessment/Plan:  56 y.o. G0P0 female for annual exam.  1. Postmenopausal/atrophic genital changes. No significant hot flushes, night sweats, vaginal dryness or any vaginal bleeding. Not sexually active. Continue to monitor report any issues or bleeding. 2. Pap smear/HPV 2013. Pap smear/HPV done today. History of LGSIL 2010 with normal Pap smears afterwards. 3. Mammography 11/2016. Continue with annual mammography when due. SBE monthly reviewed. 4. Osteoporosis. DEXA 01/2016 T score -2.7. Currently on Actonel. Managed by Dr.  Lucianne MussKumar. 5. Colonoscopy 2012 with reported repeat interval 10 years. 6. Health maintenance. No routine lab work done as patient reports this done elsewhere. Follow up 1 year, sooner as needed.   Dara LordsFONTAINE,Shuntia Exton P MD, 8:45 AM 01/15/2017

## 2017-01-15 NOTE — Addendum Note (Signed)
Addended by: Dayna BarkerGARDNER, Kelsen Celona K on: 01/15/2017 09:02 AM   Modules accepted: Orders

## 2017-01-15 NOTE — Patient Instructions (Signed)

## 2017-01-17 LAB — PAP IG AND HPV HIGH-RISK: HPV DNA HIGH RISK: NOT DETECTED

## 2017-02-12 DIAGNOSIS — F422 Mixed obsessional thoughts and acts: Secondary | ICD-10-CM | POA: Diagnosis not present

## 2017-02-26 DIAGNOSIS — F422 Mixed obsessional thoughts and acts: Secondary | ICD-10-CM | POA: Diagnosis not present

## 2017-03-01 DIAGNOSIS — F422 Mixed obsessional thoughts and acts: Secondary | ICD-10-CM | POA: Diagnosis not present

## 2017-03-13 DIAGNOSIS — F422 Mixed obsessional thoughts and acts: Secondary | ICD-10-CM | POA: Diagnosis not present

## 2017-03-19 DIAGNOSIS — F422 Mixed obsessional thoughts and acts: Secondary | ICD-10-CM | POA: Diagnosis not present

## 2017-04-09 DIAGNOSIS — F422 Mixed obsessional thoughts and acts: Secondary | ICD-10-CM | POA: Diagnosis not present

## 2017-04-14 DIAGNOSIS — G43719 Chronic migraine without aura, intractable, without status migrainosus: Secondary | ICD-10-CM | POA: Diagnosis not present

## 2017-04-14 DIAGNOSIS — G43019 Migraine without aura, intractable, without status migrainosus: Secondary | ICD-10-CM | POA: Diagnosis not present

## 2017-04-26 DIAGNOSIS — F422 Mixed obsessional thoughts and acts: Secondary | ICD-10-CM | POA: Diagnosis not present

## 2017-05-31 DIAGNOSIS — F422 Mixed obsessional thoughts and acts: Secondary | ICD-10-CM | POA: Diagnosis not present

## 2017-06-06 ENCOUNTER — Other Ambulatory Visit: Payer: Self-pay | Admitting: Endocrinology

## 2017-06-11 DIAGNOSIS — F422 Mixed obsessional thoughts and acts: Secondary | ICD-10-CM | POA: Diagnosis not present

## 2017-06-18 ENCOUNTER — Other Ambulatory Visit: Payer: Self-pay | Admitting: Endocrinology

## 2017-06-28 DIAGNOSIS — F422 Mixed obsessional thoughts and acts: Secondary | ICD-10-CM | POA: Diagnosis not present

## 2017-07-03 ENCOUNTER — Other Ambulatory Visit: Payer: Self-pay | Admitting: Endocrinology

## 2017-08-23 DIAGNOSIS — F422 Mixed obsessional thoughts and acts: Secondary | ICD-10-CM | POA: Diagnosis not present

## 2017-08-28 DIAGNOSIS — F422 Mixed obsessional thoughts and acts: Secondary | ICD-10-CM | POA: Diagnosis not present

## 2017-09-20 DIAGNOSIS — F422 Mixed obsessional thoughts and acts: Secondary | ICD-10-CM | POA: Diagnosis not present

## 2017-10-25 DIAGNOSIS — F422 Mixed obsessional thoughts and acts: Secondary | ICD-10-CM | POA: Diagnosis not present

## 2017-10-29 DIAGNOSIS — G43019 Migraine without aura, intractable, without status migrainosus: Secondary | ICD-10-CM | POA: Diagnosis not present

## 2017-10-29 DIAGNOSIS — G43719 Chronic migraine without aura, intractable, without status migrainosus: Secondary | ICD-10-CM | POA: Diagnosis not present

## 2017-11-18 ENCOUNTER — Other Ambulatory Visit (INDEPENDENT_AMBULATORY_CARE_PROVIDER_SITE_OTHER): Payer: BLUE CROSS/BLUE SHIELD

## 2017-11-18 DIAGNOSIS — E559 Vitamin D deficiency, unspecified: Secondary | ICD-10-CM

## 2017-11-18 DIAGNOSIS — E7800 Pure hypercholesterolemia, unspecified: Secondary | ICD-10-CM | POA: Diagnosis not present

## 2017-11-18 DIAGNOSIS — E063 Autoimmune thyroiditis: Secondary | ICD-10-CM | POA: Diagnosis not present

## 2017-11-18 LAB — LIPID PANEL
CHOL/HDL RATIO: 3
CHOLESTEROL: 201 mg/dL — AB (ref 0–200)
HDL: 58.3 mg/dL (ref >39.00)
LDL CALC: 107 mg/dL — AB (ref 0–99)
NONHDL: 142.68
Triglycerides: 180 mg/dL — ABNORMAL HIGH (ref 0.0–149.0)
VLDL: 36 mg/dL (ref 0.0–40.0)

## 2017-11-18 LAB — COMPREHENSIVE METABOLIC PANEL
ALT: 11 U/L (ref 0–35)
AST: 20 U/L (ref 0–37)
Albumin: 4.4 g/dL (ref 3.5–5.2)
Alkaline Phosphatase: 43 U/L (ref 39–117)
BUN: 16 mg/dL (ref 6–23)
CHLORIDE: 105 meq/L (ref 96–112)
CO2: 21 meq/L (ref 19–32)
CREATININE: 0.87 mg/dL (ref 0.40–1.20)
Calcium: 9.4 mg/dL (ref 8.4–10.5)
GFR: 71.06 mL/min (ref >60.00)
GLUCOSE: 109 mg/dL — AB (ref 70–99)
POTASSIUM: 4 meq/L (ref 3.5–5.1)
SODIUM: 139 meq/L (ref 135–145)
Total Bilirubin: 0.3 mg/dL (ref 0.2–1.2)
Total Protein: 7.1 g/dL (ref 6.0–8.3)

## 2017-11-18 LAB — VITAMIN D 25 HYDROXY (VIT D DEFICIENCY, FRACTURES): VITD: 32.27 ng/mL (ref 30.00–100.00)

## 2017-11-18 LAB — T4, FREE: Free T4: 0.82 ng/dL (ref 0.60–1.60)

## 2017-11-18 LAB — TSH: TSH: 4.53 u[IU]/mL — ABNORMAL HIGH (ref 0.35–4.50)

## 2017-11-20 NOTE — Progress Notes (Signed)
Patient ID: Ashlee Chavez, female   DOB: 03-17-1961, 57 y.o.   MRN: [id number]           Chief complaint: Treatment of hypothyroidism and osteoporosis  History of Present Illness:  OSTEOPOROSIS: She had a screening bone density done and this showed the following T-scores: Femoral neck: -2.7, previously -1.8 Spine: -1.9 FRAX fracture risk at the hip is not calculated  By comparison she has had a decline of 8-10 % in her bone density from 2 years before and 15% result from baseline She initially had a half inch height loss from baseline  She is taking calcium supplements with Os-Cal Currently taking 2000 units of vitamin D She could not afford Evista and did not want to consider Reclast because of the possible high one time cost  In 7/17 she was recommended starting treatment with Actonel 150 mg She has had no GI side effects and is taking this once a month regularly   No recent problems with back pain   LABS:  Lab Results  Component Value Date   VD25OH 32.27 11/18/2017   VD25OH 29.16 (L) 11/19/2016    HYPOTHYROIDISM:  She had a goiter diagnosed in 1996. She had a positive peroxidase antibody and has been on thyroxine since 1996  Her TSH was high in 2006 and she had symptoms of fatigue, cold intolerance and some hair loss  Since then has been on 75 mcg of Synthroid with occasional adjustments in the dose  Since 01/2014 she had been taking the same dose of levothyroxine, 75 g daily  Her TSH was higher than usual in 11/16 and she was having complaints about depression, insomnia, fatigue and more difficulties with her anxiety  Has had chronic cold sensitivity   Her dose was increased in 3/17 up to 112 g and in 7/17 it was increased to 125 g  Compliance with the medical regimen has been good with taking the tablet in the morning before breakfast.  Does take her multivitamin and calcium separately   She says she feels tired chronically and this is not any different  recently However may be getting more cold easily now TSH is slightly high at 4.5 compared to 3.1 last year   Lab Results  Component Value Date   TSH 4.53 (H) 11/18/2017   TSH 3.12 11/19/2016   TSH 4.27 07/19/2016   FREET4 0.82 11/18/2017   FREET4 0.63 07/19/2016   FREET4 0.74 01/29/2016    OTHER active problems discussed and review of systems   Past Medical History:  Diagnosis Date  . Hypercholesteremia   . Hypothyroid   . LGSIL (low grade squamous intraepithelial dysplasia) 2010  . Migraines   . OCD (obsessive compulsive disorder)   . Osteoporosis 01/2016   T score -2.7    Past Surgical History:  Procedure Laterality Date  . BREAST LUMPECTOMY  2001   right-benign  . CATARACT EXTRACTION  2005    Family History  Problem Relation Age of Onset  . Hypertension Mother   . Heart disease Mother   . Hypertension Father   . Breast cancer Paternal Aunt        Age 75    Social History:  reports that  has never smoked. she has never used smokeless tobacco. She reports that she drinks alcohol. She reports that she does not use drugs.  Allergies:  Allergies  Allergen Reactions  . Ampicillin Rash    Allergies as of 11/21/2017      Reactions  Ampicillin Rash      Medication List        Accurate as of 11/21/17  8:11 AM. Always use your most recent med list.          ALPRAZolam 0.25 MG tablet Commonly known as:  XANAX Take 0.25 mg by mouth daily as needed for anxiety.   atorvastatin 20 MG tablet Commonly known as:  LIPITOR TAKE ONE TABLET BY MOUTH ONCE DAILY   levothyroxine 125 MCG tablet Commonly known as:  SYNTHROID, LEVOTHROID TAKE 1 TABLET EVERY DAY   methylphenidate 10 MG tablet Commonly known as:  RITALIN Take 10 mg by mouth 3 (three) times daily.   mirtazapine 30 MG tablet Commonly known as:  REMERON Take 30 mg by mouth at bedtime.   N-ACETYL-L-CYSTEINE PO Take by mouth.   OSCAL 500/200 D-3 PO Take by mouth.   PARoxetine 40 MG  tablet Commonly known as:  PAXIL 2 (two) times daily.   ranitidine 75 MG tablet Commonly known as:  ZANTAC Take 1.5 mg by mouth daily as needed for heartburn.   RESTASIS 0.05 % ophthalmic emulsion Generic drug:  cycloSPORINE   risedronate 150 MG tablet Commonly known as:  ACTONEL TAKE 1 TABLET EVERY 30 DAYS WITH WATER ON EMPTY STOMACH, NOTHING BY MOUTH OR LIE DOWN FOR 30MIN   SUMAtriptan 100 MG tablet Commonly known as:  IMITREX Take 100 mg by mouth daily as needed for migraine.   topiramate 100 MG tablet Commonly known as:  TOPAMAX Take 50 mg by mouth 2 (two) times daily. 50 mg in the morning 100 mg at night   VITAMIN D PO Take by mouth.        Review of Systems   HYPERCHOLESTEROLEMIA: She has been on Lipitor 20 mg for several years, LDL stable, no other risk factors Usually has a healthy diet  Lab Results  Component Value Date   CHOL 201 (H) 11/18/2017   HDL 58.30 11/18/2017   LDLCALC 107 (H) 11/18/2017   TRIG 180.0 (H) 11/18/2017   CHOLHDL 3 11/18/2017    GLUCOSE: Her fasting glucose is 109, has not had any steroids, did have respiratory illness just before this was checked No family history of diabetes also   PHYSICAL EXAM:  BP 122/82   Pulse 93   Ht 5\' 4"  (1.626 m)   Wt 109 lb (49.4 kg)   LMP 11/19/2010   SpO2 99%   BMI 18.71 kg/m   Thyroid is not clearly palpable Biceps reflexes appear normal Heart sounds normal No carotid bruit heard  ASSESSMENT:   OSTEOPOROSIS: History of some progression over the last few years,  She now has been on Actonel once a month for this since 7/17  with no side effects  HYPOTHYROIDISM:  Subjectively difficult to assess since she tends to feel tired chronically TSH is now slightly above normal She is very compliant with the levothyroxine prescription and is not taking any interacting substances, taking this before breakfast  Long-standing history of HYPERCHOLESTEROLEMIA: LDL is still just over 100, since she  has had no other risk factors will continue 20 mg for cardiovascular protection   PLAN:    Stay on Actonel 150 mg on a monthly basis   Recheck fasting glucose and A1c in the next visit  Lipitor 20 mg: continue unchanged  Synthroid 137 g daily as TSH is higher  Follow-up in 2 months  Vitamin D 2000 units daily in addition to her Os-Cal to be continued  Ashlee Chavez 11/21/2017,  8:11 AM

## 2017-11-21 ENCOUNTER — Encounter: Payer: Self-pay | Admitting: Endocrinology

## 2017-11-21 ENCOUNTER — Ambulatory Visit: Payer: BLUE CROSS/BLUE SHIELD | Admitting: Endocrinology

## 2017-11-21 VITALS — BP 122/82 | HR 93 | Ht 64.0 in | Wt 109.0 lb

## 2017-11-21 DIAGNOSIS — R7301 Impaired fasting glucose: Secondary | ICD-10-CM | POA: Diagnosis not present

## 2017-11-21 DIAGNOSIS — E063 Autoimmune thyroiditis: Secondary | ICD-10-CM | POA: Diagnosis not present

## 2017-11-25 ENCOUNTER — Other Ambulatory Visit: Payer: Self-pay

## 2017-11-25 ENCOUNTER — Telehealth: Payer: Self-pay | Admitting: Endocrinology

## 2017-11-25 MED ORDER — LEVOTHYROXINE SODIUM 137 MCG PO TABS: 137.0000 ug | ORAL_TABLET | Freq: Every day | ORAL | 6 refills | Status: DC

## 2017-11-25 NOTE — Telephone Encounter (Signed)
Pt called about her medication of levothyroxine (SYNTHROID, LEVOTHROID) 125 MCG tablet that was supposed to have increased per pt. Please advise?  Call pt @ [phone]. Thank you!

## 2017-11-25 NOTE — Telephone Encounter (Signed)
This has been ordered 

## 2017-11-27 ENCOUNTER — Other Ambulatory Visit: Payer: Self-pay | Admitting: Endocrinology

## 2017-11-29 DIAGNOSIS — F422 Mixed obsessional thoughts and acts: Secondary | ICD-10-CM | POA: Diagnosis not present

## 2017-12-11 ENCOUNTER — Other Ambulatory Visit: Payer: Self-pay

## 2017-12-11 MED ORDER — RISEDRONATE SODIUM 150 MG PO TABS: ORAL_TABLET | ORAL | 1 refills | Status: DC

## 2017-12-19 DIAGNOSIS — Z1231 Encounter for screening mammogram for malignant neoplasm of breast: Secondary | ICD-10-CM | POA: Diagnosis not present

## 2017-12-27 DIAGNOSIS — F422 Mixed obsessional thoughts and acts: Secondary | ICD-10-CM | POA: Diagnosis not present

## 2018-01-19 ENCOUNTER — Other Ambulatory Visit: Payer: Self-pay | Admitting: Endodontics

## 2018-01-19 DIAGNOSIS — K121 Other forms of stomatitis: Secondary | ICD-10-CM | POA: Diagnosis not present

## 2018-01-27 ENCOUNTER — Ambulatory Visit: Payer: BLUE CROSS/BLUE SHIELD | Admitting: Gynecology

## 2018-01-27 ENCOUNTER — Encounter: Payer: Self-pay | Admitting: Gynecology

## 2018-01-27 VITALS — BP 118/76 | Ht 64.0 in | Wt 113.0 lb

## 2018-01-27 DIAGNOSIS — M810 Age-related osteoporosis without current pathological fracture: Secondary | ICD-10-CM | POA: Diagnosis not present

## 2018-01-27 DIAGNOSIS — Z01411 Encounter for gynecological examination (general) (routine) with abnormal findings: Secondary | ICD-10-CM | POA: Diagnosis not present

## 2018-01-27 DIAGNOSIS — N898 Other specified noninflammatory disorders of vagina: Secondary | ICD-10-CM | POA: Diagnosis not present

## 2018-01-27 DIAGNOSIS — N952 Postmenopausal atrophic vaginitis: Secondary | ICD-10-CM | POA: Diagnosis not present

## 2018-01-27 LAB — WET PREP FOR TRICH, YEAST, CLUE

## 2018-01-27 NOTE — Progress Notes (Signed)
    Ashlee Chavez 07/12/1961 [id number]        57 y.o.  G0P0 for annual gynecologic exam.  Notes a slight vaginal discharge whitish to yellowish.  Seems to correlate when she started a new vitamin.  No bleeding.  No irritation or odor.  No urinary symptoms such as frequency dysuria urgency low back pain fever or chills.  Past medical history,surgical history, problem list, medications, allergies, family history and social history were all reviewed and documented as reviewed in the EPIC chart.  ROS:  Performed with pertinent positives and negatives included in the history, assessment and plan.   Additional significant findings : None   Exam: Kennon PortelaKim Gardner assistant Vitals:   01/27/18 1500  BP: 118/76  Weight: 113 lb (51.3 kg)  Height: 5\' 4"  (1.626 m)   Body mass index is 19.4 kg/m.  General appearance:  Normal affect, orientation and appearance. Skin: Grossly normal HEENT: Without gross lesions.  No cervical or supraclavicular adenopathy. Thyroid normal.  Lungs:  Clear without wheezing, rales or rhonchi Cardiac: RR, without RMG Abdominal:  Soft, nontender, without masses, guarding, rebound, organomegaly or hernia Breasts:  Examined lying and sitting without masses, retractions, discharge or axillary adenopathy. Pelvic:  Ext, BUS, Vagina: With atrophic changes.  No significant discharge noted.  Upper vaginal constriction band noted as previously.  Cervix: Atrophic.  Uterus: Anteverted, normal size, shape and contour, midline and mobile nontender   Adnexa: Without masses or tenderness    Anus and perineum: Normal   Rectovaginal: Normal sphincter tone without palpated masses or tenderness.    Assessment/Plan:  57 y.o. G0P0 female for annual gynecologic exam.   1. Postmenopausal/atrophic genital changes.  No significant hot flushes, night sweats, vaginal dryness or any bleeding. 2. Vaginal discharge.  Noticed since starting a new vitamin.  No associated symptoms such as itching  irritation or odor.  Wet prep is negative.  Reviewed with patient may be secondary to the new vitamin, may be low-grade bacterial vaginosis or physiologic.  Options for management to include treatment for low-grade bacterial vaginosis versus observation reviewed.  Patient's more comfortable with observation and will follow-up if this persists/worsens.  3. Pap smear/HPV 2018.  No Pap smear done today.  History of LGSIL 2010 with normal Pap smears since.  Plan repeat Pap smear at 5-year interval per current screening guidelines. 4. Mammography 11/2017.  Continue with annual mammography next year.  Breast exam normal. 5. Colonoscopy 2012 with reported repeat interval 10 years. 6. Osteoporosis.  DEXA 2017 T score -2.7.  Continues on Actonel per Dr. Lucianne MussKumar who manages her bone health. 7. Health maintenance.  No routine lab work done as patient reports this done elsewhere.  Follow-up 1 year, sooner as needed.   Dara Lordsimothy P Kaliyan Osbourn MD, 3:20 PM 01/27/2018

## 2018-01-27 NOTE — Addendum Note (Signed)
Addended by: Dayna BarkerGARDNER, Karizma Cheek K on: 01/27/2018 04:06 PM   Modules accepted: Orders

## 2018-01-27 NOTE — Patient Instructions (Signed)
Follow-up in 1 year for annual exam, sooner if any issues.  Follow-up if the vaginal discharge becomes an issue.

## 2018-01-29 DIAGNOSIS — F422 Mixed obsessional thoughts and acts: Secondary | ICD-10-CM | POA: Diagnosis not present

## 2018-01-31 DIAGNOSIS — F422 Mixed obsessional thoughts and acts: Secondary | ICD-10-CM | POA: Diagnosis not present

## 2018-02-17 ENCOUNTER — Other Ambulatory Visit (INDEPENDENT_AMBULATORY_CARE_PROVIDER_SITE_OTHER): Payer: BLUE CROSS/BLUE SHIELD

## 2018-02-17 DIAGNOSIS — E063 Autoimmune thyroiditis: Secondary | ICD-10-CM | POA: Diagnosis not present

## 2018-02-17 DIAGNOSIS — R7301 Impaired fasting glucose: Secondary | ICD-10-CM | POA: Diagnosis not present

## 2018-02-17 LAB — GLUCOSE, RANDOM: GLUCOSE: 91 mg/dL (ref 70–99)

## 2018-02-17 LAB — HEMOGLOBIN A1C: HEMOGLOBIN A1C: 6 % (ref 4.6–6.5)

## 2018-02-17 LAB — TSH: TSH: 0.1 u[IU]/mL — ABNORMAL LOW (ref 0.35–4.50)

## 2018-02-19 ENCOUNTER — Other Ambulatory Visit: Payer: Self-pay | Admitting: Endocrinology

## 2018-02-19 ENCOUNTER — Ambulatory Visit (INDEPENDENT_AMBULATORY_CARE_PROVIDER_SITE_OTHER): Payer: BLUE CROSS/BLUE SHIELD | Admitting: Endocrinology

## 2018-02-19 ENCOUNTER — Encounter: Payer: Self-pay | Admitting: Endocrinology

## 2018-02-19 VITALS — BP 118/74 | HR 110 | Ht 64.0 in | Wt 112.4 lb

## 2018-02-19 DIAGNOSIS — E063 Autoimmune thyroiditis: Secondary | ICD-10-CM | POA: Diagnosis not present

## 2018-02-19 LAB — T4, FREE: Free T4: 0.81 ng/dL (ref 0.60–1.60)

## 2018-02-19 MED ORDER — LEVOTHYROXINE SODIUM 125 MCG PO TABS: 125.0000 ug | ORAL_TABLET | Freq: Every day | ORAL | 3 refills | Status: DC

## 2018-02-19 NOTE — Progress Notes (Signed)
Patient ID: Ashlee Chavez, female   DOB: 11/22/1960, 57 y.o.   MRN: [id number]           Chief complaint: Treatment of hypothyroidism and osteoporosis  History of Present Illness:   OSTEOPOROSIS: The following is a copy of the previous note  She had a screening bone density done and this showed the following T-scores: Femoral neck: -2.7, previously -1.8 Spine: -1.9 FRAX fracture risk at the hip is not calculated  By comparison she has had a decline of 8-10 % in her bone density from 2 years before and 15% result from baseline She initially had a half inch height loss from baseline  She is taking calcium supplements with Os-Cal Currently taking 2000 units of vitamin D She could not afford Evista and did not want to consider Reclast because of the possible high one time cost  In 7/17 she was recommended starting treatment with Actonel 150 mg She has had no GI side effects and is taking this once a month regularly   No recent problems with back pain   LABS:  Lab Results  Component Value Date   VD25OH 32.27 11/18/2017   VD25OH 29.16 (L) 11/19/2016    HYPOTHYROIDISM:  She had a goiter diagnosed in 1996. She had a positive peroxidase antibody and has been on thyroxine since 1996  Her TSH was high in 2006 and she had symptoms of fatigue, cold intolerance and some hair loss  Since then has been on 75 mcg of Synthroid with occasional adjustments in the dose  Since 01/2014 she had been taking the same dose of levothyroxine, 75 g daily  Her TSH was higher than usual in 11/16 and she was having complaints about depression, insomnia, fatigue and more difficulties with her anxiety  Has had chronic cold sensitivity   Her dose was increased in 7/17 and in 1/19 it was further increased to to 137 mcg because of a high TSH She was not having any unusual fatigue but her TSH was gradually increasing  Compliance with the medical regimen has been good with taking the tablet in the morning  before breakfast.  Does take her multivitamin and calcium separately   She says she feels cold chronically She has mild fatigue which is not any different Over the last 3 weeks or so she has had occasional feeling of her heart going fast and also shaking her hands   TSH is now surprisingly subnormal   Wt Readings from Last 3 Encounters:  02/19/18 112 lb 6.4 oz (51 kg)  01/27/18 113 lb (51.3 kg)  11/21/17 109 lb (49.4 kg)    Lab Results  Component Value Date   TSH 0.10 Repeated and verified X2. (L) 02/17/2018   TSH 4.53 (H) 11/18/2017   TSH 3.12 11/19/2016   FREET4 0.82 11/18/2017   FREET4 0.63 07/19/2016   FREET4 0.74 01/29/2016    OTHER active problems discussed and review of systems   Past Medical History:  Diagnosis Date  . Gum lesion    Biopsy done-benign  . Hypercholesteremia   . Hypothyroid   . LGSIL (low grade squamous intraepithelial dysplasia) 2010  . Migraines   . OCD (obsessive compulsive disorder)   . Osteoporosis 01/2016   T score -2.7    Past Surgical History:  Procedure Laterality Date  . BREAST LUMPECTOMY  2001   right-benign  . CATARACT EXTRACTION  2005  . Gum Biopsy     Benign-2019    Family History  Problem Relation  Age of Onset  . Hypertension Mother   . Heart disease Mother   . Hypertension Father   . Breast cancer Paternal Aunt        Age 59  . Diabetes Neg Hx     Social History:  reports that she has never smoked. She has never used smokeless tobacco. She reports that she drinks alcohol. She reports that she does not use drugs.  Allergies:  Allergies  Allergen Reactions  . Ampicillin Rash    Allergies as of 02/19/2018      Reactions   Ampicillin Rash      Medication List        Accurate as of 02/19/18  8:43 AM. Always use your most recent med list.          ALPRAZolam 0.25 MG tablet Commonly known as:  XANAX Take 0.25 mg by mouth daily as needed for anxiety.   atorvastatin 20 MG tablet Commonly known as:   LIPITOR TAKE ONE TABLET BY MOUTH ONCE DAILY   levothyroxine 137 MCG tablet Commonly known as:  SYNTHROID, LEVOTHROID Take 1 tablet (137 mcg total) by mouth daily before breakfast.   methylphenidate 10 MG tablet Commonly known as:  RITALIN Take 10 mg by mouth 3 (three) times daily.   mirtazapine 30 MG tablet Commonly known as:  REMERON Take 30 mg by mouth at bedtime.   N-ACETYL-L-CYSTEINE PO Take by mouth.   OSCAL 500/200 D-3 PO Take by mouth.   PARoxetine 40 MG tablet Commonly known as:  PAXIL 2 (two) times daily.   ranitidine 75 MG tablet Commonly known as:  ZANTAC Take 1.5 mg by mouth daily as needed for heartburn.   risedronate 150 MG tablet Commonly known as:  ACTONEL TAKE 1 TABLET EVERY 30 DAYS WITH WATER ON EMPTY STOMACH, NOTHING BY MOUTH OR LIE DOWN FOR 30MIN   SUMAtriptan 100 MG tablet Commonly known as:  IMITREX Take 100 mg by mouth daily as needed for migraine.   topiramate 100 MG tablet Commonly known as:  TOPAMAX Take 50 mg by mouth 2 (two) times daily. 50 mg in the morning 100 mg at night   VITAMIN D PO Take by mouth.        Review of Systems   HYPERCHOLESTEROLEMIA: She has been on Lipitor 20 mg for several years  LDL stable, no other risk factors Usually has a healthy diet  Lab Results  Component Value Date   CHOL 201 (H) 11/18/2017   HDL 58.30 11/18/2017   LDLCALC 107 (H) 11/18/2017   TRIG 180.0 (H) 11/18/2017   CHOLHDL 3 11/18/2017    GLUCOSE: Her fasting glucose is back to normal now However A1c is in prediabetic range No family history of diabetes and no recent weight gain  Lab Results  Component Value Date   HGBA1C 6.0 02/17/2018   Lab Results  Component Value Date   LDLCALC 107 (H) 11/18/2017   CREATININE 0.87 11/18/2017      PHYSICAL EXAM:  BP 118/74 (BP Location: Left Arm, Patient Position: Sitting, Cuff Size: Normal)   Pulse (!) 110   Ht 5\' 4"  (1.626 m)   Wt 112 lb 6.4 oz (51 kg)   LMP 11/19/2010   SpO2 98%    BMI 19.29 kg/m    ASSESSMENT:    HYPOTHYROIDISM:    Currently she is on thyroid supplementation with 137 mcg since January Although the dose was only changed by 12 mcg when her TSH was 4.5 and trending higher she  appears to have mildly symptomatic hyperthyroidism now TSH is 0.1  Previously increased fasting glucose: This is now normal but surprisingly her A1c is 6.0, no previous comparisons   PLAN:    Free T4 will be checked today  If this is not significantly high will have her take 125 mcg levothyroxine  We will need to check her A1c periodically and consider glucose tolerance test if higher  Follow-up in 6 weeks  Jamin Panther 02/19/2018, 8:43 AM

## 2018-03-05 DIAGNOSIS — M7989 Other specified soft tissue disorders: Secondary | ICD-10-CM | POA: Diagnosis not present

## 2018-03-05 DIAGNOSIS — W5501XA Bitten by cat, initial encounter: Secondary | ICD-10-CM | POA: Diagnosis not present

## 2018-03-07 DIAGNOSIS — F422 Mixed obsessional thoughts and acts: Secondary | ICD-10-CM | POA: Diagnosis not present

## 2018-04-07 ENCOUNTER — Other Ambulatory Visit (INDEPENDENT_AMBULATORY_CARE_PROVIDER_SITE_OTHER): Payer: BLUE CROSS/BLUE SHIELD

## 2018-04-07 DIAGNOSIS — E063 Autoimmune thyroiditis: Secondary | ICD-10-CM

## 2018-04-07 LAB — T4, FREE: Free T4: 0.9 ng/dL (ref 0.60–1.60)

## 2018-04-07 LAB — TSH: TSH: 0.06 u[IU]/mL — ABNORMAL LOW (ref 0.35–4.50)

## 2018-04-09 NOTE — Progress Notes (Signed)
Patient ID: Ashlee Chavez, female   DOB: Dec 27, 1960, 56 y.o.   MRN: [id number]           Chief complaint: Treatment of hypothyroidism and osteoporosis  History of Present Illness:   OSTEOPOROSIS: The following is a copy of the previous note  She had a screening bone density done and this showed the following T-scores: Femoral neck: -2.7, previously -1.8 Spine: -1.9 FRAX fracture risk at the hip is not calculated  By comparison she has had a decline of 8-10 % in her bone density from 2 years before and 15% result from baseline She initially had a half inch height loss from baseline  She is taking calcium supplements with Os-Cal Currently taking 2000 units of vitamin D She could not afford Evista and did not want to consider Reclast because of the possible high one time cost  In 7/17 she was recommended starting treatment with Actonel 150 mg She has had no GI side effects and is taking this once a month regularly   No recent problems with back pain   LABS:  Lab Results  Component Value Date   VD25OH 32.27 11/18/2017   VD25OH 29.16 (L) 11/19/2016    HYPOTHYROIDISM:  She had a goiter diagnosed in 1996. She had a positive peroxidase antibody and has been on thyroxine since 1996  Her TSH was high in 2006 and she had symptoms of fatigue, cold intolerance and some hair loss  Since then has been on 75 mcg of Synthroid with occasional adjustments in the dose  Since 01/2014 she had been taking the same dose of levothyroxine, 75 g daily  Her TSH was higher than usual in 11/16 and she was having complaints about depression, insomnia, fatigue and more difficulties with her anxiety  Has had chronic cold sensitivity   Her dose was increased in 7/17 and in 1/19 it was further increased to to 137 mcg because of a high TSH She was not having any unusual fatigue but her TSH was gradually increasing  More recently her levothyroxine dose was reduced down to 125 in April because of her  TSH being 0.1 She was also having some palpitations and shakiness  More recently she does not have any palpitations but has chronic cold intolerance which is not different No new fatigue Weight is the same  Compliance with the medical regimen has been good with taking the tablet in the morning before breakfast.  Does take her multivitamin and calcium separately She has not been on any new medications She prefers to take generic medications  TSH is now surprisingly further decreased with slight rise in free T4 compared to previous level   Wt Readings from Last 3 Encounters:  04/10/18 113 lb 3.2 oz (51.3 kg)  02/19/18 112 lb 6.4 oz (51 kg)  01/27/18 113 lb (51.3 kg)    Lab Results  Component Value Date   TSH 0.06 (L) 04/07/2018   TSH 0.10 Repeated and verified X2. (L) 02/17/2018   TSH 4.53 (H) 11/18/2017   FREET4 0.90 04/07/2018   FREET4 0.81 02/19/2018   FREET4 0.82 11/18/2017    OTHER active problems discussed and review of systems   Past Medical History:  Diagnosis Date  . Gum lesion    Biopsy done-benign  . Hypercholesteremia   . Hypothyroid   . LGSIL (low grade squamous intraepithelial dysplasia) 2010  . Migraines   . OCD (obsessive compulsive disorder)   . Osteoporosis 01/2016   T score -2.7  Past Surgical History:  Procedure Laterality Date  . BREAST LUMPECTOMY  2001   right-benign  . CATARACT EXTRACTION  2005  . Gum Biopsy     Benign-2019    Family History  Problem Relation Age of Onset  . Hypertension Mother   . Heart disease Mother   . Hypertension Father   . Breast cancer Paternal Aunt        Age 69  . Diabetes Neg Hx     Social History:  reports that she has never smoked. She has never used smokeless tobacco. She reports that she drinks alcohol. She reports that she does not use drugs.  Allergies:  Allergies  Allergen Reactions  . Ampicillin Rash    Allergies as of 04/10/2018      Reactions   Ampicillin Rash      Medication List         Accurate as of 04/10/18  8:55 AM. Always use your most recent med list.          ALPRAZolam 0.25 MG tablet Commonly known as:  XANAX Take 0.25 mg by mouth daily as needed for anxiety.   atorvastatin 20 MG tablet Commonly known as:  LIPITOR TAKE ONE TABLET BY MOUTH ONCE DAILY   levothyroxine 125 MCG tablet Commonly known as:  SYNTHROID, LEVOTHROID Take 1 tablet (125 mcg total) by mouth daily before breakfast.   methylphenidate 10 MG tablet Commonly known as:  RITALIN Take 10 mg by mouth 3 (three) times daily.   mirtazapine 30 MG tablet Commonly known as:  REMERON Take 30 mg by mouth at bedtime.   OSCAL 500/200 D-3 PO Take by mouth.   PARoxetine 40 MG tablet Commonly known as:  PAXIL 2 (two) times daily.   ranitidine 75 MG tablet Commonly known as:  ZANTAC Take 1.5 mg by mouth daily as needed for heartburn.   risedronate 150 MG tablet Commonly known as:  ACTONEL TAKE 1 TABLET EVERY 30 DAYS WITH WATER ON EMPTY STOMACH, NOTHING BY MOUTH OR LIE DOWN FOR   SUMAtriptan 100 MG tablet Commonly known as:  IMITREX Take 100 mg by mouth daily as needed for migraine.   topiramate 100 MG tablet Commonly known as:  TOPAMAX Take 50 mg by mouth 2 (two) times daily. 50 mg in the morning 100 mg at night   VITAMIN D PO Take by mouth.        Review of Systems   HYPERCHOLESTEROLEMIA: She has been on Lipitor 20 mg for several years  LDL stable, no other risk factors Usually has a healthy diet  Lab Results  Component Value Date   CHOL 201 (H) 11/18/2017   HDL 58.30 11/18/2017   LDLCALC 107 (H) 11/18/2017   TRIG 180.0 (H) 11/18/2017   CHOLHDL 3 11/18/2017    GLUCOSE: Her fasting glucose is back to normal recently  However A1c is in prediabetic range No family history of diabetes  Lab Results  Component Value Date   HGBA1C 6.0 02/17/2018   Lab Results  Component Value Date   LDLCALC 107 (H) 11/18/2017   CREATININE 0.87 11/18/2017       PHYSICAL EXAM:  BP 112/72 (BP Location: Left Arm, Patient Position: Sitting, Cuff Size: Normal)   Pulse 93   Ht  (1.626 m)   Wt 113 lb 3.2 oz (51.3 kg)   LMP 11/19/2010   SpO2 99%   BMI 19.43 kg/m   Thyroid 1-1/2 times normal on the right, relatively soft Biceps reflex  is brisk with normal relaxation Minimal tremor on the left  ASSESSMENT:    HYPOTHYROIDISM:    Currently she is on thyroid supplementation with 125 mcg since her TSH was slightly low with 137 Even with reducing her medication dosage her TSH has gone down further  Not clear this is a result of being on generic medication Currently she is asymptomatic Although she may need 100 mcg will decrease the dose more slowly since she may take time to equilibrate   PLAN:    Levothyroxine 112 mcg  She will check the cost of brand-name medications and let us know if these are affordable for the next visit  Follow-up in 6 weeks  Marvie Brevik 04/10/2018, 8:55 AM

## 2018-04-10 ENCOUNTER — Encounter: Payer: Self-pay | Admitting: Endocrinology

## 2018-04-10 ENCOUNTER — Ambulatory Visit: Payer: BLUE CROSS/BLUE SHIELD | Admitting: Endocrinology

## 2018-04-10 VITALS — BP 112/72 | HR 93 | Ht 64.0 in | Wt 113.2 lb

## 2018-04-10 DIAGNOSIS — E063 Autoimmune thyroiditis: Secondary | ICD-10-CM | POA: Diagnosis not present

## 2018-04-10 MED ORDER — LEVOTHYROXINE SODIUM 112 MCG PO TABS: 112.0000 ug | ORAL_TABLET | Freq: Every day | ORAL | 1 refills | Status: DC

## 2018-04-10 NOTE — Patient Instructions (Signed)
Check cost of Levoxyl, Synthroid, Unithroid

## 2018-04-14 DIAGNOSIS — F422 Mixed obsessional thoughts and acts: Secondary | ICD-10-CM | POA: Diagnosis not present

## 2018-05-05 DIAGNOSIS — G43719 Chronic migraine without aura, intractable, without status migrainosus: Secondary | ICD-10-CM | POA: Diagnosis not present

## 2018-05-05 DIAGNOSIS — G43019 Migraine without aura, intractable, without status migrainosus: Secondary | ICD-10-CM | POA: Diagnosis not present

## 2018-05-06 ENCOUNTER — Other Ambulatory Visit: Payer: Self-pay | Admitting: Endocrinology

## 2018-05-30 DIAGNOSIS — F422 Mixed obsessional thoughts and acts: Secondary | ICD-10-CM | POA: Diagnosis not present

## 2018-05-31 ENCOUNTER — Other Ambulatory Visit: Payer: Self-pay | Admitting: Endocrinology

## 2018-06-02 DIAGNOSIS — S0501XA Injury of conjunctiva and corneal abrasion without foreign body, right eye, initial encounter: Secondary | ICD-10-CM | POA: Diagnosis not present

## 2018-06-04 ENCOUNTER — Other Ambulatory Visit (INDEPENDENT_AMBULATORY_CARE_PROVIDER_SITE_OTHER): Payer: BLUE CROSS/BLUE SHIELD

## 2018-06-04 DIAGNOSIS — E063 Autoimmune thyroiditis: Secondary | ICD-10-CM

## 2018-06-04 LAB — T4, FREE: Free T4: 0.7 ng/dL (ref 0.60–1.60)

## 2018-06-04 LAB — TSH: TSH: 1.67 u[IU]/mL (ref 0.35–4.50)

## 2018-06-08 ENCOUNTER — Encounter: Payer: Self-pay | Admitting: Endocrinology

## 2018-06-08 ENCOUNTER — Ambulatory Visit: Payer: BLUE CROSS/BLUE SHIELD | Admitting: Endocrinology

## 2018-06-08 VITALS — BP 112/70 | HR 96 | Ht 64.0 in | Wt 114.4 lb

## 2018-06-08 DIAGNOSIS — R7301 Impaired fasting glucose: Secondary | ICD-10-CM

## 2018-06-08 DIAGNOSIS — E063 Autoimmune thyroiditis: Secondary | ICD-10-CM

## 2018-06-08 DIAGNOSIS — E7800 Pure hypercholesterolemia, unspecified: Secondary | ICD-10-CM

## 2018-06-08 NOTE — Progress Notes (Signed)
Patient ID: Ashlee Chavez, female   DOB: 06-13-1961, 57 y.o.   MRN: [id number]           Chief complaint: Treatment of hypothyroidism   History of Present Illness:   HYPOTHYROIDISM: See below  OSTEOPOROSIS: The following is a copy of the previous note  She had a screening bone density done and this showed the following T-scores: Femoral neck: -2.7, previously -1.8 Spine: -1.9 FRAX fracture risk at the hip is not calculated  By comparison she has had a decline of 8-10 % in her bone density from 2 years before and 15% result from baseline She initially had a half inch height loss from baseline  She is taking calcium supplements with Os-Cal Currently taking 2000 units of vitamin D She could not afford Evista and did not want to consider Reclast because of the possible high one time cost  In 7/17 she was recommended starting treatment with Actonel 150 mg She has had no GI side effects and is taking this once a month regularly    LABS:  Lab Results  Component Value Date   VD25OH 32.27 11/18/2017   VD25OH 29.16 (L) 11/19/2016    HYPOTHYROIDISM:  She had a goiter diagnosed in 1996. She had a positive peroxidase antibody and has been on thyroxine since 1996  Her TSH was high in 2006 and she had symptoms of fatigue, cold intolerance and some hair loss  Since then has been on 75 mcg of Synthroid with occasional adjustments in the dose  Since 01/2014 she had been taking the same dose of levothyroxine, 75 g daily  Her TSH was higher than usual in 11/16 and she was having complaints about depression, insomnia, fatigue and more difficulties with her anxiety  Has had chronic cold sensitivity   Her dose was increased in 7/17 and in 1/19 it was further increased to to 137 mcg because of a high TSH She was not having any unusual fatigue but her TSH was gradually increasing  More recently her levothyroxine dose was reduced progressively down to 112 mcg in 03/2018 She was initially  having some palpitations and shakiness but no symptoms on the last visit when she was on 125 mcg She feels fairly good and has no anxiety, no change in her energy level or any recurrence of palpitations Her weight is gradually improving  Compliance with taking her prescription has been good with taking the levothyroxine tablet in the morning before breakfast.  Does take her multivitamin and calcium separately  She prefers to take generic medications  TSH is finally back to normal at 1.7   Wt Readings from Last 3 Encounters:  06/08/18 114 lb 6.4 oz (51.9 kg)  04/10/18 113 lb 3.2 oz (51.3 kg)  02/19/18 112 lb 6.4 oz (51 kg)    Lab Results  Component Value Date   TSH 1.67 06/04/2018   TSH 0.06 (L) 04/07/2018   TSH 0.10 Repeated and verified X2. (L) 02/17/2018   FREET4 0.70 06/04/2018   FREET4 0.90 04/07/2018   FREET4 0.81 02/19/2018    OTHER active problems discussed and review of systems   Past Medical History:  Diagnosis Date  . Gum lesion    Biopsy done-benign  . Hypercholesteremia   . Hypothyroid   . LGSIL (low grade squamous intraepithelial dysplasia) 2010  . Migraines   . OCD (obsessive compulsive disorder)   . Osteoporosis 01/2016   T score -2.7    Past Surgical History:  Procedure Laterality Date  .  BREAST LUMPECTOMY  2001   right-benign  . CATARACT EXTRACTION  2005  . Gum Biopsy     Benign-2019    Family History  Problem Relation Age of Onset  . Hypertension Mother   . Heart disease Mother   . Hypertension Father   . Breast cancer Paternal Aunt        Age 4  . Diabetes Neg Hx     Social History:  reports that she has never smoked. She has never used smokeless tobacco. She reports that she drinks alcohol. She reports that she does not use drugs.  Allergies:  Allergies  Allergen Reactions  . Ampicillin Rash    Allergies as of 06/08/2018      Reactions   Ampicillin Rash      Medication List        Accurate as of 06/08/18  8:41 AM. Always  use your most recent med list.          ALPRAZolam 0.25 MG tablet Commonly known as:  XANAX Take 0.25 mg by mouth daily as needed for anxiety.   atorvastatin 20 MG tablet Commonly known as:  LIPITOR TAKE ONE TABLET BY MOUTH ONCE DAILY   levothyroxine 112 MCG tablet Commonly known as:  SYNTHROID, LEVOTHROID TAKE 1 TABLET (112 MCG TOTAL) BY MOUTH DAILY BEFORE BREAKFAST.   methylphenidate 10 MG tablet Commonly known as:  RITALIN Take 10 mg by mouth 3 (three) times daily.   mirtazapine 30 MG tablet Commonly known as:  REMERON Take 30 mg by mouth at bedtime.   OSCAL 500/200 D-3 PO Take by mouth.   PARoxetine 40 MG tablet Commonly known as:  PAXIL 2 (two) times daily.   ranitidine 75 MG tablet Commonly known as:  ZANTAC Take 1.5 mg by mouth daily as needed for heartburn.   risedronate 150 MG tablet Commonly known as:  ACTONEL TAKE 1 TABLET EVERY 30 DAYS WITH WATER ON EMPTY STOMACH, NOTHING BY MOUTH OR LIE DOWN FOR   SUMAtriptan 100 MG tablet Commonly known as:  IMITREX Take 100 mg by mouth daily as needed for migraine.   topiramate 100 MG tablet Commonly known as:  TOPAMAX Take 40 mg by mouth 2 (two) times daily. TAKE 40MG  BY MOUTH 2 TIMES DAILY.   VITAMIN D PO Take by mouth.        Review of Systems   HYPERCHOLESTEROLEMIA: She has been on Lipitor 20 mg for several years  LDL stable, no other risk factors Usually has a healthy diet  Lab Results  Component Value Date   CHOL 201 (H) 11/18/2017   HDL 58.30 11/18/2017   LDLCALC 107 (H) 11/18/2017   TRIG 180.0 (H) 11/18/2017   CHOLHDL 3 11/18/2017    GLUCOSE: Her fasting glucose has been more recently normal  However A1c is in prediabetic range No family history of diabetes  Lab Results  Component Value Date   HGBA1C 6.0 02/17/2018   Lab Results  Component Value Date   LDLCALC 107 (H) 11/18/2017   CREATININE 0.87 11/18/2017      PHYSICAL EXAM:  BP 112/70 (BP Location: Left Arm,  Patient Position: Sitting, Cuff Size: Normal)   Pulse 96   Ht 5\' 4"  (1.626 m)   Wt 114 lb 6.4 oz (51.9 kg)   LMP 11/19/2010   SpO2 98%   BMI 19.64 kg/m   Thyroid 1-1/2 times normal on the right, soft-inform Her reflexes appear normal to relaxation Skin appears normal  ASSESSMENT:  HYPOTHYROIDISM:   She has had variable thyroid levels on various doses over the last year Not clear if she had a transient thyroiditis requiring progressive decrease in her dose  Currently appears to have leveled off her thyroid levels back to normal with taking 112 mcg daily Subjectively she is doing well also  Previous weight loss: This appears to be improving    PLAN:    Continue levothyroxine 112 mcg  we will recheck labs for other Medical issues on the next visit  Follow-up in 4 months  Reminded her to continue taking vitamin D  Reather LittlerAjay Swathi Dauphin 06/08/2018, 8:41 AM

## 2018-06-11 ENCOUNTER — Other Ambulatory Visit: Payer: Self-pay | Admitting: Endocrinology

## 2018-06-20 DIAGNOSIS — F422 Mixed obsessional thoughts and acts: Secondary | ICD-10-CM | POA: Diagnosis not present

## 2018-06-24 ENCOUNTER — Other Ambulatory Visit: Payer: Self-pay | Admitting: Endocrinology

## 2018-07-14 DIAGNOSIS — F422 Mixed obsessional thoughts and acts: Secondary | ICD-10-CM | POA: Diagnosis not present

## 2018-07-19 ENCOUNTER — Other Ambulatory Visit: Payer: Self-pay | Admitting: Endocrinology

## 2018-07-25 DIAGNOSIS — F422 Mixed obsessional thoughts and acts: Secondary | ICD-10-CM | POA: Diagnosis not present

## 2018-08-17 ENCOUNTER — Other Ambulatory Visit: Payer: Self-pay | Admitting: Endocrinology

## 2018-09-01 ENCOUNTER — Encounter: Payer: Self-pay | Admitting: Emergency Medicine

## 2018-09-08 ENCOUNTER — Ambulatory Visit: Payer: Self-pay | Admitting: Psychiatry

## 2018-09-08 ENCOUNTER — Encounter: Payer: Self-pay | Admitting: Psychiatry

## 2018-09-08 ENCOUNTER — Ambulatory Visit: Payer: BLUE CROSS/BLUE SHIELD | Admitting: Psychiatry

## 2018-09-08 DIAGNOSIS — F422 Mixed obsessional thoughts and acts: Secondary | ICD-10-CM

## 2018-09-08 DIAGNOSIS — F331 Major depressive disorder, recurrent, moderate: Secondary | ICD-10-CM | POA: Diagnosis not present

## 2018-09-08 DIAGNOSIS — F4010 Social phobia, unspecified: Secondary | ICD-10-CM

## 2018-09-08 MED ORDER — MIRTAZAPINE 30 MG PO TABS: 60.0000 mg | ORAL_TABLET | Freq: Every day | ORAL | 1 refills | Status: DC

## 2018-09-08 NOTE — Progress Notes (Signed)
Ashlee Chavez [id number] Feb 21, 1961 57 y.o.  Subjective:   Patient ID:  Ashlee Chavez is a 57 y.o. (DOB Aug 08, 1961) female.  Chief Complaint:  Chief Complaint  Patient presents with  . Anxiety  . Depression    HPI Ashlee Chavez presents to the office today for follow-up of recently worse depression DT relationship loss.  Thinks med increase mirtazapine did help. [name] and describes anxiety as Moderate. Anxiety symptoms include: Excessive Worry, Obsessive Compulsive Symptoms:   Handwashing,,. Less despair and hopelessness.  Pt reports no sleep issues. Pt reports that appetite is good. Pt reports that energy is good and down slightly. Concentration is no change. Suicidal thoughts:  denied by patient.  Numerous failed psychiatric medication trials include Lexapro 60 mg a day Abilify between 1 and 2.5 mg and up to 15 mg with no response, buspirone 30 mg, clomipramine, Cytomel, Wellbutrin 300 with side effects, lithium 625, Paxil with weight gain, pramipexole with insomnia, sertraline to 50, Lunesta with crying spells, topiramate, Deplin, propranolol, Rexulti which helped initially, Vraylar  Review of Systems:  Review of Systems  Constitutional: Negative for diaphoresis and fatigue.  Neurological: Negative for tremors and weakness.  Psychiatric/Behavioral: Negative for agitation, behavioral problems, confusion, decreased concentration, dysphoric mood, hallucinations, self-injury, sleep disturbance and suicidal ideas. The patient is not nervous/anxious and is not hyperactive.     Medications: I have reviewed the patient's current medications.  Current Outpatient Medications  Medication Sig Dispense Refill  . Acetylcysteine (NAC) 600 MG CAPS Take 1,200 mg by mouth 2 (two) times daily.    Marland Kitchen ALPRAZolam (XANAX) 0.25 MG tablet Take 0.25 mg by mouth daily as needed for anxiety.     Marland Kitchen atorvastatin (LIPITOR) 20 MG tablet TAKE ONE TABLET BY MOUTH  ONCE DAILY 90 tablet 1  . Calcium Carbonate-Vitamin D (OSCAL 500/200 D-3 PO) Take by mouth.    . Cholecalciferol (VITAMIN D PO) Take by mouth.    . levothyroxine (SYNTHROID, LEVOTHROID) 112 MCG tablet TAKE 1 TABLET (112 MCG TOTAL) BY MOUTH DAILY BEFORE BREAKFAST. 30 tablet 1  . methylphenidate (RITALIN) 10 MG tablet Take 10 mg by mouth 3 (three) times daily.  0  . mirtazapine (REMERON) 30 MG tablet Take 45 mg by mouth at bedtime.   0  . PARoxetine (PAXIL) 40 MG tablet 2 (two) times daily.   0  . risedronate (ACTONEL) 150 MG tablet TAKE 1 TABLET EVERY 30 DAYS WITH WATER ON EMPTY STOMACH, NOTHING BY MOUTH OR LIE DOWN FOR 3 tablet 1  . SUMAtriptan (IMITREX) 100 MG tablet Take 100 mg by mouth daily as needed for migraine.    . topiramate (TOPAMAX) 100 MG tablet Take 100 mg by mouth 2 (two) times daily. TAKE 40MG  BY MOUTH 2 TIMES DAILY.    . ranitidine (ZANTAC) 75 MG tablet Take 1.5 mg by mouth daily as needed for heartburn.      No current facility-administered medications for this visit.     Medication Side Effects: None  Allergies:  Allergies  Allergen Reactions  . Ampicillin Rash    Past Medical History:  Diagnosis Date  . Gum lesion    Biopsy done-benign  . Hypercholesteremia   . Hypothyroid   . LGSIL (low grade squamous intraepithelial dysplasia) 2010  . Migraines   . OCD (obsessive compulsive disorder)   . Osteoporosis 01/2016   T score -2.7    Family History  Problem Relation Age of Onset  . Hypertension  Mother   . Heart disease Mother   . Hypertension Father   . Breast cancer Paternal Aunt        Age 68  . Diabetes Neg Hx     Social History   Socioeconomic History  . Marital status: Single    Spouse name: Not on file  . Number of children: Not on file  . Years of education: Not on file  . Highest education level: Not on file  Occupational History  . Not on file  Social Needs  . Financial resource strain: Not on file  . Food insecurity:    Worry: Not  on file    Inability: Not on file  . Transportation needs:    Medical: Not on file    Non-medical: Not on file  Tobacco Use  . Smoking status: Never Smoker  . Smokeless tobacco: Never Used  Substance and Sexual Activity  . Alcohol use: Yes    Alcohol/week: 0.0 standard drinks    Comment: wine occassionally  . Drug use: No  . Sexual activity: Never    Birth control/protection: Post-menopausal    Comment: 1st intercourse 7 yo-5 partners  Lifestyle  . Physical activity:    Days per week: Not on file    Minutes per session: Not on file  . Stress: Not on file  Relationships  . Social connections:    Talks on phone: Not on file    Gets together: Not on file    Attends religious service: Not on file    Active member of club or organization: Not on file    Attends meetings of clubs or organizations: Not on file    Relationship status: Not on file  . Intimate partner violence:    Fear of current or ex partner: Not on file    Emotionally abused: Not on file    Physically abused: Not on file    Forced sexual activity: Not on file  Other Topics Concern  . Not on file  Social History Narrative  . Not on file    Past Medical History, Surgical history, Social history, and Family history were reviewed and updated as appropriate.   Please see review of systems for further details on the patient's review from today.   Objective:   Physical Exam:  LMP 11/19/2010   Physical Exam  Constitutional: She is oriented to person, place, and time. She appears well-developed. No distress.  Musculoskeletal: She exhibits no deformity.  Neurological: She is alert and oriented to person, place, and time. She displays no atrophy. Coordination normal.  Psychiatric: She has a normal mood and affect. Her speech is normal and behavior is normal. Judgment and thought content normal. Her mood appears not anxious. Her affect is not angry, not blunt, not labile and not inappropriate. Cognition and memory  are normal. She does not exhibit a depressed mood. She expresses no homicidal and no suicidal ideation. She expresses no suicidal plans and no homicidal plans.  Insight intact. No auditory or visual hallucinations. No delusions.  She is attentive.    Lab Review:     Component Value Date/Time   NA 139 11/18/2017 0808   K 4.0 11/18/2017 0808   CL 105 11/18/2017 0808   CO2 21 11/18/2017 0808   GLUCOSE 91 02/17/2018 0843   BUN 16 11/18/2017 0808   CREATININE 0.87 11/18/2017 0808   CALCIUM 9.4 11/18/2017 0808   PROT 7.1 11/18/2017 0808   ALBUMIN 4.4 11/18/2017 0808   AST 20  11/18/2017 0808   ALT 11 11/18/2017 0808   ALKPHOS 43 11/18/2017 0808   BILITOT 0.3 11/18/2017 0808       Component Value Date/Time   WBC 5.9 01/29/2016 0834   RBC 4.85 01/29/2016 0834   HGB 15.2 (H) 01/29/2016 0834   HCT 46.2 (H) 01/29/2016 0834   PLT 232.0 01/29/2016 0834   MCV 95.3 01/29/2016 0834   MCHC 33.0 01/29/2016 0834   RDW 14.7 01/29/2016 0834   LYMPHSABS 1.3 01/29/2016 0834   MONOABS 0.4 01/29/2016 0834   EOSABS 0.3 01/29/2016 0834   BASOSABS 0.0 01/29/2016 0834    No results found for: POCLITH, LITHIUM   No results found for: PHENYTOIN, PHENOBARB, VALPROATE, CBMZ   .res Assessment: Plan:    Major depressive disorder, recurrent episode, moderate (HCC)  Social anxiety disorder  Mixed obsessional thoughts and acts  Please see After Visit Summary for patient specific instructions.  Greater than 50% of face to face time with patient was spent on counseling and coordination of care. We discussed TRD and anxiety but has partial response with the higher dosage of mirtazapine 60mg  HS. she has a long list of failed psychiatric meds.  She is not tended to do well with higher dosages of SSRIs.  Tolerating mirtazapine.  Her chronic anxiety has been fairly treatment resistant but she is probably doing the best she can do.  She is continuing psychotherapy with Dr. Farrel Demark This appointment was 30  minutes. Follow-up 3 months  Meredith Staggers MD, DFAPA  Future Appointments  Date Time Provider Department Center  09/18/2018  4:00 PM Robley Fries, PhD [facility] None  10/12/2018  8:15 AM [facility] None  10/14/2018  8:15 AM Reather Littler, MD [facility] None  02/02/2019  4:00 PM Fontaine, Nadyne Coombes, MD [facility]    No orders of the defined types were placed in this encounter.     -------------------------------

## 2018-09-09 ENCOUNTER — Other Ambulatory Visit: Payer: Self-pay | Admitting: Endocrinology

## 2018-09-18 ENCOUNTER — Ambulatory Visit: Payer: BLUE CROSS/BLUE SHIELD | Admitting: Psychiatry

## 2018-09-18 DIAGNOSIS — F331 Major depressive disorder, recurrent, moderate: Secondary | ICD-10-CM

## 2018-09-18 DIAGNOSIS — F422 Mixed obsessional thoughts and acts: Secondary | ICD-10-CM

## 2018-09-18 DIAGNOSIS — F4010 Social phobia, unspecified: Secondary | ICD-10-CM

## 2018-09-18 NOTE — Progress Notes (Signed)
      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  Hosp also followed self-neglect and naive care of husband, included several threatening conditions together.  Hard to see her emaciated, has had to be a vigilant informant for her care as [name] lowballs her needs, and as husband has been ineffectual, absent.  Gratified to see her recover breathing since a med change.  PT visited till 3:30p today, providing company while [name]'s sisters got cleaned up and her husband is missing.  Pressures to be the constant one for her, considering confronting [name]'s husband.  Continues devotionals, book club.  Was all but ready to get back on dating website before [name]'s health scare.  Apprehensive about it, naturally, and concerned she will neurotically use care for [facility] as a reason to defer confronting social anxiety and low self-esteem after the crisis is past.    Interventions: Assertiveness/Communication and Ego-Supportive Validated, normalized all feelings, encouraged not to classify this time as risk of self-defeating behavior but a time of straight-up caring for a "soul sister".    Mental Status Exam:    Appearance:   Casual     Behavior:  Appropriate and Sharing  Motor:  Normal  Speech/Language:   Clear and Coherent  Affect:  wearied  Mood:  dysthymic  Thought process:  normal and though worrisome  Thought content:    WNL and self-esteem concerns  Sensory/Perceptual disturbances:    WNL  Orientation:  WNL  Attention:  Good  Concentration:  Good  Memory:  WNL  Fund of knowledge:   Good  Insight:    Good  Judgment:   Good  Impulse  Control:  Good     Risk Assessment: Danger to Self:  No Self-injurious Behavior: No Danger to Others: No Duty to Warn:no Physical Aggression / Violence:No  Access to Firearms a concern: No  Gang Involvement:No   Diagnosis:   ICD-10-CM   1. Mixed obsessional thoughts and acts F42.2   2. Social anxiety disorder F40.10   3. Major depressive disorder, recurrent episode, moderate (HCC) F33.1      Plan:  . Consider confronting [name]'s husband about taking her time and supportive presence for  Granted and steeping up to be [name]'s primary family support. Consider coordinating with her sister(s) for best effect. . Continue self-care for socialization, health habits . Don't worry about using this crisis as an "excuse" vs. the challenge of dating, take issues in their true order . Continue to utilize previously learned skills ad lib . Maintain medication, if prescribed, and work faithfully with relevant prescriber(s) . Call the clinic on-call service, present to ER, or call 911 if any life-threatening emergency . Follow up with me next Saturday work day (12/14, 9am)    Robley Fries, PhD

## 2018-09-25 ENCOUNTER — Other Ambulatory Visit: Payer: Self-pay

## 2018-09-25 MED ORDER — TOPIRAMATE 100 MG PO TABS: ORAL_TABLET | ORAL | 0 refills | Status: DC

## 2018-10-02 ENCOUNTER — Other Ambulatory Visit: Payer: Self-pay | Admitting: Endocrinology

## 2018-10-11 ENCOUNTER — Other Ambulatory Visit: Payer: Self-pay | Admitting: Endocrinology

## 2018-10-12 ENCOUNTER — Other Ambulatory Visit (INDEPENDENT_AMBULATORY_CARE_PROVIDER_SITE_OTHER): Payer: BLUE CROSS/BLUE SHIELD

## 2018-10-12 DIAGNOSIS — E7800 Pure hypercholesterolemia, unspecified: Secondary | ICD-10-CM

## 2018-10-12 DIAGNOSIS — E063 Autoimmune thyroiditis: Secondary | ICD-10-CM | POA: Diagnosis not present

## 2018-10-12 DIAGNOSIS — R7301 Impaired fasting glucose: Secondary | ICD-10-CM | POA: Diagnosis not present

## 2018-10-12 LAB — COMPREHENSIVE METABOLIC PANEL
ALT: 13 U/L (ref 0–35)
AST: 20 U/L (ref 0–37)
Albumin: 4.1 g/dL (ref 3.5–5.2)
Alkaline Phosphatase: 35 U/L — ABNORMAL LOW (ref 39–117)
BUN: 16 mg/dL (ref 6–23)
CO2: 22 mEq/L (ref 19–32)
Calcium: 8.7 mg/dL (ref 8.4–10.5)
Chloride: 109 mEq/L (ref 96–112)
Creatinine, Ser: 0.79 mg/dL (ref 0.40–1.20)
GFR: 79.18 mL/min (ref >60.00)
GLUCOSE: 89 mg/dL (ref 70–99)
POTASSIUM: 4.1 meq/L (ref 3.5–5.1)
Sodium: 140 mEq/L (ref 135–145)
TOTAL PROTEIN: 6.6 g/dL (ref 6.0–8.3)
Total Bilirubin: 0.2 mg/dL (ref 0.2–1.2)

## 2018-10-12 LAB — HEMOGLOBIN A1C: Hgb A1c MFr Bld: 6.3 % (ref 4.6–6.5)

## 2018-10-12 LAB — LIPID PANEL
Cholesterol: 190 mg/dL (ref 0–200)
HDL: 68.5 mg/dL (ref >39.00)
LDL Cholesterol: 96 mg/dL (ref 0–99)
NONHDL: 121.35
Total CHOL/HDL Ratio: 3
Triglycerides: 128 mg/dL (ref 0.0–149.0)
VLDL: 25.6 mg/dL (ref 0.0–40.0)

## 2018-10-12 LAB — TSH: TSH: 2.44 u[IU]/mL (ref 0.35–4.50)

## 2018-10-12 LAB — T4, FREE: Free T4: 0.57 ng/dL — ABNORMAL LOW (ref 0.60–1.60)

## 2018-10-14 ENCOUNTER — Encounter: Payer: Self-pay | Admitting: Endocrinology

## 2018-10-14 ENCOUNTER — Ambulatory Visit: Payer: BLUE CROSS/BLUE SHIELD | Admitting: Endocrinology

## 2018-10-14 VITALS — BP 108/62 | HR 96 | Ht 64.0 in | Wt 118.0 lb

## 2018-10-14 DIAGNOSIS — R7302 Impaired glucose tolerance (oral): Secondary | ICD-10-CM | POA: Diagnosis not present

## 2018-10-14 DIAGNOSIS — E063 Autoimmune thyroiditis: Secondary | ICD-10-CM | POA: Diagnosis not present

## 2018-10-14 DIAGNOSIS — E559 Vitamin D deficiency, unspecified: Secondary | ICD-10-CM | POA: Diagnosis not present

## 2018-10-14 DIAGNOSIS — E7800 Pure hypercholesterolemia, unspecified: Secondary | ICD-10-CM

## 2018-10-14 NOTE — Progress Notes (Signed)
Patient ID: Ashlee Chavez, female   DOB: 03/03/1961, 57 y.o.   MRN: [id number]           Chief complaint: Endocrinology follow-up  History of Present Illness:    OSTEOPOROSIS:   She had a screening bone density done and this showed the following T-scores: Femoral neck: -2.7, previously -1.8 Spine: -1.9  By comparison she had a decline of 8-10 % in her bone density from 2 years before and 15% result from baseline She initially had a half inch height loss from baseline  She could not afford Evista and did not want to consider Reclast because of the possible high one time cost  In 7/17 she was started on treatment with Actonel 150 mg She has had no GI side effects and is taking this once a month regularly  She is taking calcium supplements with Os-Cal Also continues to be taking 5000 units of vitamin D  LABS:  Lab Results  Component Value Date   VD25OH 32.27 11/18/2017   VD25OH 29.16 (L) 11/19/2016    HYPOTHYROIDISM:  She had a goiter diagnosed in 1996. She had a positive peroxidase antibody and has been on thyroxine since 1996  Her TSH was high in 2006 and she had symptoms of fatigue, cold intolerance and some hair loss  Since then has been on 75 mcg of Synthroid with occasional adjustments in the dose  Since 01/2014 she had been taking the same dose of levothyroxine, 75 g daily  Her TSH was higher than usual in 11/16 and she was having complaints about depression, insomnia, fatigue and more difficulties with her anxiety  Has had chronic cold sensitivity   Her dose was increased in 7/17 and in 1/19 it was further increased to to 137 mcg because of a high TSH She was not having any unusual fatigue but her TSH was gradually increasing She was initially having some palpitations and shakiness but no symptoms on the visit when she was on 125 mcg  More recently her levothyroxine dose was reduced progressively down to 112 mcg in 03/2018  She feels fairly good and has no  unusual fatigue Also no anxiety or any recurrence of palpitations Her weight is coming up in her usual weight is about 115  Compliance with taking her prescription has been good with taking the levothyroxine tablet in the morning before breakfast.  Does take her multivitamin and calcium separately  She prefers to take generic medications  TSH is normal now    Wt Readings from Last 3 Encounters:  10/14/18 118 lb (53.5 kg)  06/08/18 114 lb 6.4 oz (51.9 kg)  04/10/18 113 lb 3.2 oz (51.3 kg)    Lab Results  Component Value Date   TSH 2.44 10/12/2018   TSH 1.67 06/04/2018   TSH 0.06 (L) 04/07/2018   FREET4 0.57 (L) 10/12/2018   FREET4 0.70 06/04/2018   FREET4 0.90 04/07/2018    OTHER active problems discussed and review of systems   Past Medical History:  Diagnosis Date  . Gum lesion    Biopsy done-benign  . Hypercholesteremia   . Hypothyroid   . LGSIL (low grade squamous intraepithelial dysplasia) 2010  . Migraines   . OCD (obsessive compulsive disorder)   . Osteoporosis 01/2016   T score -2.7    Past Surgical History:  Procedure Laterality Date  . BREAST LUMPECTOMY  2001   right-benign  . CATARACT EXTRACTION  2005  . Gum Biopsy     Benign-2019  `  Family History  Problem Relation Age of Onset  . Hypertension Mother   . Heart disease Mother   . Hypertension Father   . Breast cancer Paternal Aunt        Age 59  . Diabetes Neg Hx     Social History:  reports that she has never smoked. She has never used smokeless tobacco. She reports that she drinks alcohol. She reports that she does not use drugs.  Allergies:  Allergies  Allergen Reactions  . Ampicillin Rash    Allergies as of 10/14/2018      Reactions   Ampicillin Rash      Medication List        Accurate as of 10/14/18  8:12 AM. Always use your most recent med list.          ALPRAZolam 0.25 MG tablet Commonly known as:  XANAX Take 0.25 mg by mouth daily as needed for anxiety.     atorvastatin 20 MG tablet Commonly known as:  LIPITOR TAKE ONE TABLET BY MOUTH ONCE DAILY   levothyroxine 112 MCG tablet Commonly known as:  SYNTHROID, LEVOTHROID TAKE 1 TABLET (112 MCG TOTAL) BY MOUTH DAILY BEFORE BREAKFAST.   methylphenidate 10 MG tablet Commonly known as:  RITALIN Take 10 mg by mouth 3 (three) times daily.   mirtazapine 30 MG tablet Commonly known as:  REMERON Take 2 tablets (60 mg total) by mouth at bedtime.   NAC 600 MG Caps Generic drug:  Acetylcysteine Take 1,200 mg by mouth 2 (two) times daily.   OSCAL 500/200 D-3 PO Take by mouth.   PARoxetine 40 MG tablet Commonly known as:  PAXIL 2 (two) times daily.   risedronate 150 MG tablet Commonly known as:  ACTONEL TAKE 1 TABLET EVERY 30 DAYS WITH WATER ON EMPTY STOMACH, NOTHING BY MOUTH OR LIE DOWN FOR 30MIN   SUMAtriptan 100 MG tablet Commonly known as:  IMITREX Take 100 mg by mouth daily as needed for migraine.   topiramate 100 MG tablet Commonly known as:  TOPAMAX 1 tablet  (200mg  total) twice daily.   VITAMIN D PO Take by mouth.        Review of Systems   HYPERCHOLESTEROLEMIA: She has been on Lipitor 20 mg for several years  LDL improved and now below 100, no other risk factors Usually has a low-fat diet  Lab Results  Component Value Date   CHOL 190 10/12/2018   HDL 68.50 10/12/2018   LDLCALC 96 10/12/2018   TRIG 128.0 10/12/2018   CHOLHDL 3 10/12/2018    GLUCOSE: Her fasting glucose has been consistently normal  However A1c is in prediabetic range again No family history of diabetes  Lab Results  Component Value Date   HGBA1C 6.3 10/12/2018   HGBA1C 6.0 02/17/2018   Lab Results  Component Value Date   LDLCALC 96 10/12/2018   CREATININE 0.79 10/12/2018      PHYSICAL EXAM:  BP 108/62 (BP Location: Left Arm, Patient Position: Sitting, Cuff Size: Normal)   Pulse 96   Ht 5\' 4"  (1.626 m)   Wt 118 lb (53.5 kg)   LMP 11/19/2010   SpO2 98%   BMI 20.25 kg/m    Thyroid 1-1/2 times normal on the right and isthmus smooth, slightly firm, minimally palpable on the left   ASSESSMENT:    HYPOTHYROIDISM:   She has had variable thyroid levels on various doses over the last year  Now appears to have stable thyroxine requirement and is doing well  with 112 mcg levothyroxine supplement She is taking this regularly No symptoms suggestive of under or over replacement currently  As before she has a small goiter  Abnormal A1c: Discussed that we could consider doing a 2-hour glucose tolerance test since her fasting readings are still normal and will reevaluate this on the next visit  Osteoporosis: She will have a follow-up bone density at her upcoming visit with the gynecologist, meanwhile continue Actonel  LIPIDS: Adequately controlled on Lipitor   PLAN:    Continue levothyroxine 112 mcg Check vitamin D level on the next visit Recheck A1c in the next visit Recommended starting a regular exercise program She was given a list of PCPs to establish with   Reather Littler 10/14/2018, 8:12 AM

## 2018-10-23 NOTE — Progress Notes (Signed)
Psychotherapy Progress Note -- Ashlee CzarAndy Irby Fails, PhD, [facility]  Patient ID: Ashlee Chavez     MRN: [id number]     Date: 10/23/2018     Therapy format: Individual psychotherapy Start: 9:10a Stop: 10:10a Time Spent: 60 min Accompanied by: none  Session narrative -- interim history, self-report of stressors and symptoms, applications of prior therapy, status changes, and interventions in session Dear friend Ashlee Chavez died, after deciding to stop dialysis, just gave up, after PT and 2 sisters objectively heard evidence that her condition was not so bad, actually, recoverable.  Saw Ashlee Chavez begin to confront her own massive self-neglect and begin treatment for pneumonia after 2 weeks sedentary.  Knew she had been losing weight but stunned how emaciated she actually was.  Best understanding that her friend wanted out, from life and her lonely marriage, after 25 years of sacrifice and declining PT's requests for her to take better care of herself, too.  Wanted to scream at the funeral over the idea expressed that Ashlee Chavez "was a Ashlee Chavez".  Discussed some back story from Ashlee Chavez's confession 10 years ago that he didn't love her.  Awkward as well to have Ashlee Chavez offer her Ashlee Chavez's car, at the funeral, something which Ashlee Chavez clearly wanted to do for her and a gift of significant value.  Awkward further to be told later that she owed Ashlee Chavez the money she got selling her own car in trade.  In general, saw Ashlee Chavez seize control of everything pretty quickly, left a bad taste.  Discussed meanings of Ashlee Chavez's life and death and PT's attributions.  Affirmed both PT's assertiveness and restraint and validated feelings re. friend's self-treatment and family responses.  Offered imaginary conversation from beyond the grave to complete unmet feelings and put distress to rest.  Personally, volunteering now, joined a dating site (without searching yet), and more interactive with sister Ashlee Chavez of late.  Added, in Ashlee Chavez's voice,  commendations on re-engaging more of her own life and taking care of herself as she'd encouraged the deceased, and reassured per PT's faith that she is well enough now.  Therapeutic modalities: Cognitive Behavioral Therapy, Gestalt/Psychodrama, Grief Therapy and faith-sensitive  Mental Status/Observations:  Appearance:   Casual and Neat     Behavior:  Appropriate  Motor:  Normal  Speech/Language:   Clear and Coherent  Affect:  Appropriate and Full Range  Mood:  normal and appropriate to content  Thought process:  normal  Thought content:    WNL  Sensory/Perceptual disturbances:    WNL  Orientation:  WNL  Attention:  Good  Concentration:  Good  Memory:  WNL  Insight:    Good  Judgment:   Good  Impulse Control:  Good   Risk Assessment: Danger to Self:  No Self-injurious Behavior: No Danger to Others: No Duty to Warn:no Physical Aggression / Violence:No  Access to Firearms a concern: No   Diagnosis:   ICD-10-CM   1. Mixed obsessional thoughts and acts F42.2   2. Social anxiety disorder F40.10   3. Major depressive disorder, recurrent episode, moderate (HCC) F33.1     Assessment of progress:  improving  Plan:  . Continue imaginary dialogue ad lib to express -- and hear -- as needed from Ashlee Chavez and complete emotional needs re. Relationship and grieving . Continue to utilize previously learned skills ad lib . Maintain medication, if prescribed, and work faithfully with relevant prescriber(s) . Call the clinic on-call service, present to ER, or call 911 if any life-threatening emergency . Follow up  with me in about 1 mo  Ashlee Fries, PhD

## 2018-10-24 ENCOUNTER — Ambulatory Visit (INDEPENDENT_AMBULATORY_CARE_PROVIDER_SITE_OTHER): Payer: BLUE CROSS/BLUE SHIELD | Admitting: Psychiatry

## 2018-10-24 DIAGNOSIS — F4010 Social phobia, unspecified: Secondary | ICD-10-CM

## 2018-10-24 DIAGNOSIS — F422 Mixed obsessional thoughts and acts: Secondary | ICD-10-CM | POA: Diagnosis not present

## 2018-10-24 DIAGNOSIS — F331 Major depressive disorder, recurrent, moderate: Secondary | ICD-10-CM | POA: Diagnosis not present

## 2018-10-26 ENCOUNTER — Other Ambulatory Visit: Payer: Self-pay | Admitting: Endocrinology

## 2018-10-26 ENCOUNTER — Other Ambulatory Visit: Payer: Self-pay | Admitting: Psychiatry

## 2018-10-26 DIAGNOSIS — G43719 Chronic migraine without aura, intractable, without status migrainosus: Secondary | ICD-10-CM | POA: Diagnosis not present

## 2018-10-26 DIAGNOSIS — G43019 Migraine without aura, intractable, without status migrainosus: Secondary | ICD-10-CM | POA: Diagnosis not present

## 2018-11-03 ENCOUNTER — Other Ambulatory Visit: Payer: Self-pay

## 2018-11-03 MED ORDER — PAROXETINE HCL 40 MG PO TABS: 40.0000 mg | ORAL_TABLET | Freq: Two times a day (BID) | ORAL | 1 refills | Status: DC

## 2018-11-18 ENCOUNTER — Telehealth: Payer: Self-pay | Admitting: Psychiatry

## 2018-11-18 ENCOUNTER — Other Ambulatory Visit: Payer: Self-pay | Admitting: Psychiatry

## 2018-11-18 DIAGNOSIS — F900 Attention-deficit hyperactivity disorder, predominantly inattentive type: Secondary | ICD-10-CM

## 2018-11-18 MED ORDER — MIRTAZAPINE 30 MG PO TABS: 60.0000 mg | ORAL_TABLET | Freq: Every day | ORAL | 1 refills | Status: DC

## 2018-11-18 MED ORDER — METHYLPHENIDATE HCL 10 MG PO TABS: 10.0000 mg | ORAL_TABLET | Freq: Three times a day (TID) | ORAL | 0 refills | Status: DC

## 2018-11-18 NOTE — Telephone Encounter (Signed)
Please refill both Concerta and Mirtazepine for pt.  [facility]  [address] [place], [place].

## 2018-11-18 NOTE — Progress Notes (Signed)
Note asking for refill of Concerta and  Remeron.  Chart indicates patient is on Ritalin and Remeron.  Those 2 were refilled

## 2018-11-21 ENCOUNTER — Other Ambulatory Visit: Payer: Self-pay | Admitting: Endocrinology

## 2018-11-28 ENCOUNTER — Ambulatory Visit (INDEPENDENT_AMBULATORY_CARE_PROVIDER_SITE_OTHER): Payer: BLUE CROSS/BLUE SHIELD | Admitting: Psychiatry

## 2018-11-28 DIAGNOSIS — Z634 Disappearance and death of family member: Secondary | ICD-10-CM

## 2018-11-28 DIAGNOSIS — F422 Mixed obsessional thoughts and acts: Secondary | ICD-10-CM

## 2018-11-28 DIAGNOSIS — F4010 Social phobia, unspecified: Secondary | ICD-10-CM

## 2018-11-28 DIAGNOSIS — F331 Major depressive disorder, recurrent, moderate: Secondary | ICD-10-CM

## 2018-11-28 NOTE — Progress Notes (Signed)
Psychotherapy Progress Note [facility]  Patient ID: Ashlee HashimotoJanet C Chavez     MRN: [id number]      Date: 11/28/2018     Start: 9:04a Stop: 9:55a Time Spent: 51 min Therapy format: Individual psychotherapy  Session narrative -- presenting needs, interim history, self-report of stressors and symptoms, applications of prior therapy, status changes, and interventions in session Been second-guessing decision to accept/condone [name]'s choice to die.  Feels guilty for approving, "supporting" her decision to die.  Discussed values she acted upon, recognized unconditional love in supporting her decision, 30 years' learning that Kendal HymenBonnie would not come around to challenging her judgment, Kendal HymenBonnie had shown her reliably that she was fundamentally tired of leaving in her condition, and PT could not have either convinced her otherwise nor conscienced leaving her as a desperate attempt to persuade her.  Resolved she did the right thing.    Been keeping in touch with [name]'s sisters and mother, despite some obsessing whether it would be welcome or intrusive.  Confirmed she is welcome.  Book club last night, spoke more than ever before, even applauded for doing so (vs. usual reticence).  Lunch today with nurse from work, befriending her further.  Went to mass Martyn MalayXmas Eve with friend Verlon AuLeslie and a book club acquaintance and accepted an invitation to mass tonight at another church.  Grieving the regression of her church into pre-reform catholic ways that are stifling spirit and Designer, television/film setdraining membership.    Unexpectedly moved to consider retirement scenarios, including the beach, which she normally does not settle well with (for unruly hair, she says), but friend just bought land there and wth Kendal HymenBonnie gone, [place] in [place].    Meanwhile, work pattern continues to be that Pt is interrupted frequently, inflating the time she must be on the job, she sees other Clinical research associateadmin personnel frequently out of office, and  boss, who is largely absentee herself, will not believe problems PT points out, particularly inadequate job done by HR (not hiring enough caregivers, have to turn down business) and demands detailed observations about HR's outages (with implication of added workload to PT).  Interruptions in her own work already pushing her hours to 50.  Bonuses unlikely with semi-strangled business.  More motivated on the whole to consider her future, retirement as a closer option than it has seemed, and then what is affordable, how she would have attachments and care for her own health, etc.  Brother not thinking about future, sister-in-law unreliable offer to move back to home area in [place] (bad climate, no assurance of being able to see them much), and sister is locked into a vision of never leaving parents' home (which she rejoined at age 58, ostensibly to help them, but stands to inherit alone as part of parents' indulgence of her).  Friends, including Consuella Loselaine now, seem to offer far better prospect of family-like connections for her elderhood, and her plan to relocate to [place] over time shows best prospect of a place where PT might combine connections, climate, and affordability.  Therapeutic modalities: Cognitive Behavioral Therapy, Solution-Oriented/Positive Psychology, Grief Therapy and problem-solving, values clarification  Mental Status/Observations: Appearance:   Casual, Neat and Well Groomed     Behavior:  Appropriate  Motor:  Normal  Speech/Language:   Clear and Coherent  Affect:  Appropriate  Mood:  worrisome  Thought process:  normal  Thought content:    WNL  Sensory/Perceptual disturbances:    WNL  Orientation:  WNL  Attention:  Good  Concentration:  Good  Memory:  WNL  Insight:    Good  Judgment:   Good  Impulse Control:  Good   Risk Assessment: Danger to Self:  No Self-injurious Behavior: No Danger to Others: No Duty to Warn:no Physical Aggression / Violence:No  Access to Firearms a concern:  No   Diagnosis:   ICD-10-CM   1. Mixed obsessional thoughts and acts F42.2   2. Major depressive disorder, recurrent episode, moderate (HCC) F33.1   3. Social anxiety disorder F40.10   4. Bereavement Z63.4    Assessment of progress:  improving  Plan:  . Recommendations/advice as noted above . Self-affirm moral questions about end of [name]'s life settled; OCD may continue to "argue the case" but "courtroom closed" . Work on future planning as desired, address banking/mortgage questions to Health and safety inspector . Continue to utilize previously learned skills ad lib . Maintain medication, if prescribed, and work faithfully with relevant prescriber(s) . Call the clinic on-call service, present to ER, or call 911 if any life-threatening emergency . Return in about 3 weeks (around 12/19/2018).  9am   Robley Fries, PhD [place] Licensed Psychologist

## 2018-12-02 ENCOUNTER — Ambulatory Visit: Payer: BLUE CROSS/BLUE SHIELD | Admitting: Psychiatry

## 2018-12-14 ENCOUNTER — Other Ambulatory Visit: Payer: Self-pay | Admitting: Endocrinology

## 2018-12-16 ENCOUNTER — Ambulatory Visit: Payer: BLUE CROSS/BLUE SHIELD | Admitting: Psychiatry

## 2018-12-16 ENCOUNTER — Encounter: Payer: Self-pay | Admitting: Psychiatry

## 2018-12-16 DIAGNOSIS — F331 Major depressive disorder, recurrent, moderate: Secondary | ICD-10-CM | POA: Diagnosis not present

## 2018-12-16 DIAGNOSIS — F900 Attention-deficit hyperactivity disorder, predominantly inattentive type: Secondary | ICD-10-CM

## 2018-12-16 DIAGNOSIS — F422 Mixed obsessional thoughts and acts: Secondary | ICD-10-CM

## 2018-12-16 DIAGNOSIS — F4010 Social phobia, unspecified: Secondary | ICD-10-CM

## 2018-12-16 DIAGNOSIS — Z634 Disappearance and death of family member: Secondary | ICD-10-CM

## 2018-12-16 NOTE — Patient Instructions (Signed)
Increase the Ritalin to 1-1/2 tablets or 15 mg 3 times a day.  After 2 or 3 weeks if you feel it is necessary and is well-tolerated then you can increase to 2 tablets 3 times a day and inform us if that has been helpful

## 2018-12-16 NOTE — Progress Notes (Signed)
Ashlee Chavez [id number] December 12, 1960 58 y.o.  Subjective:   Patient ID:  Ashlee Chavez is a 58 y.o. (DOB Aug 08, 1961) female.  Chief Complaint:  Chief Complaint  Patient presents with  . Follow-up    Medication Management  . Depression  . Anxiety   Last seen October 29 HPI [name] presents to the office today for follow-up of recently worse depression DT relationship loss.  At last visit we increased mirtazapine to 60 mg daily.  NO SE noted.  Thinks it's hopeless.  Best friend died in October 13, 2023 and there's no body left.  Sudden and unexpected.  Alone.  Not close to B and S who live elsewhere.  Talks to God daily.  Gets to work daily.  Function is good there.  [name] and describes anxiety as Moderate. Anxiety symptoms include: Excessive Worry, Obsessive Compulsive Symptoms:   Handwashing,,. Less anxiety with the OCD in the past.  Pt reports no sleep issues. Pt reports that appetite is good. Pt reports that energy is good and down slightly. Concentration is no change. Suicidal thoughts:  denied by patient.  Numerous failed psychiatric medication trials include Lexapro 60 mg a day, Zoloft 250,  Abilify between 1 and 2.5 mg and up to 15 mg with no response, buspirone 30 mg, clomipramine, Cytomel, Wellbutrin 300 with side effects, lithium 625, Paxil with weight gain, pramipexole with insomnia,  Lunesta with crying spells, topiramate, Deplin, propranolol, Rexulti which helped initially, Vraylar, Ritalin. No history of Prozac, Viibryd.  Review of Systems:  Review of Systems  Constitutional: Negative for diaphoresis and fatigue.  Neurological: Negative for tremors and weakness.  Psychiatric/Behavioral: Negative for agitation, behavioral problems, confusion, decreased concentration, dysphoric mood, hallucinations, self-injury, sleep disturbance and suicidal ideas. The patient is not nervous/anxious and is not hyperactive.     Medications: I  have reviewed the patient's current medications.  Current Outpatient Medications  Medication Sig Dispense Refill  . Acetylcysteine (NAC) 600 MG CAPS Take 1,200 mg by mouth 2 (two) times daily.    Marland Kitchen ALPRAZolam (XANAX) 0.25 MG tablet Take 0.25 mg by mouth daily as needed for anxiety.     Marland Kitchen atorvastatin (LIPITOR) 20 MG tablet TAKE ONE TABLET BY MOUTH ONCE DAILY 90 tablet 1  . Cholecalciferol (VITAMIN D PO) Take by mouth.    . levothyroxine (SYNTHROID, LEVOTHROID) 112 MCG tablet TAKE 1 TABLET (112 MCG TOTAL) BY MOUTH DAILY BEFORE BREAKFAST. 30 tablet 3  . methylphenidate (RITALIN) 10 MG tablet Take 1 tablet (10 mg total) by mouth 3 (three) times daily. 270 tablet 0  . mirtazapine (REMERON) 30 MG tablet Take 2 tablets (60 mg total) by mouth at bedtime. 180 tablet 1  . PARoxetine (PAXIL) 40 MG tablet Take 1 tablet (40 mg total) by mouth 2 (two) times daily. 180 tablet 1  . risedronate (ACTONEL) 150 MG tablet TAKE 1 TABLET EVERY 30 DAYS WITH WATER ON EMPTY STOMACH, NOTHING BY MOUTH OR LIE DOWN FOR 3 tablet 1  . SUMAtriptan (IMITREX) 100 MG tablet Take 100 mg by mouth daily as needed for migraine.    . topiramate (TOPAMAX) 100 MG tablet 1 tablet  (200mg  total) twice daily. 180 tablet 0  . Calcium Carbonate-Vitamin D (OSCAL 500/200 D-3 PO) Take by mouth.     No current facility-administered medications for this visit.     Medication Side Effects: None  Allergies:  Allergies  Allergen Reactions  . Ampicillin Rash    Past Medical  History:  Diagnosis Date  . Gum lesion    Biopsy done-benign  . Hypercholesteremia   . Hypothyroid   . LGSIL (low grade squamous intraepithelial dysplasia) 2010  . Migraines   . OCD (obsessive compulsive disorder)   . Osteoporosis 01/2016   T score -2.7    Family History  Problem Relation Age of Onset  . Hypertension Mother   . Heart disease Mother   . Hypertension Father   . Breast cancer Paternal Aunt        Age 60  . Diabetes Neg Hx     Social  History   Socioeconomic History  . Marital status: Single    Spouse name: Not on file  . Number of children: Not on file  . Years of education: Not on file  . Highest education level: Not on file  Occupational History  . Not on file  Social Needs  . Financial resource strain: Not on file  . Food insecurity:    Worry: Not on file    Inability: Not on file  . Transportation needs:    Medical: Not on file    Non-medical: Not on file  Tobacco Use  . Smoking status: Never Smoker  . Smokeless tobacco: Never Used  Substance and Sexual Activity  . Alcohol use: Yes    Alcohol/week: 0.0 standard drinks    Comment: wine occassionally  . Drug use: No  . Sexual activity: Never    Birth control/protection: Post-menopausal    Comment: 1st intercourse 58 yo-5 partners  Lifestyle  . Physical activity:    Days per week: Not on file    Minutes per session: Not on file  . Stress: Not on file  Relationships  . Social connections:    Talks on phone: Not on file    Gets together: Not on file    Attends religious service: Not on file    Active member of club or organization: Not on file    Attends meetings of clubs or organizations: Not on file    Relationship status: Not on file  . Intimate partner violence:    Fear of current or ex partner: Not on file    Emotionally abused: Not on file    Physically abused: Not on file    Forced sexual activity: Not on file  Other Topics Concern  . Not on file  Social History Narrative  . Not on file    Past Medical History, Surgical history, Social history, and Family history were reviewed and updated as appropriate.   Please see review of systems for further details on the patient's review from today.   Objective:   Physical Exam:  LMP 11/19/2010   Physical Exam Constitutional:      General: She is not in acute distress.    Appearance: She is well-developed.  Musculoskeletal:        General: No deformity.  Neurological:     Mental  Status: She is alert and oriented to person, place, and time.     Motor: No atrophy.     Coordination: Coordination normal.  Psychiatric:        Attention and Perception: She is attentive.        Mood and Affect: Mood is anxious and depressed. Affect is not labile, blunt, angry or inappropriate.        Speech: Speech normal.        Behavior: Behavior normal.        Thought Content: Thought content  normal. Thought content does not include homicidal or suicidal ideation. Thought content does not include homicidal or suicidal plan.        Judgment: Judgment normal.     Comments: Insight intact. No auditory or visual hallucinations. No delusions.  Residual OCD.      Lab Review:     Component Value Date/Time   NA 140 10/12/2018 0819   K 4.1 10/12/2018 0819   CL 109 10/12/2018 0819   CO2 22 10/12/2018 0819   GLUCOSE 89 10/12/2018 0819   BUN 16 10/12/2018 0819   CREATININE 0.79 10/12/2018 0819   CALCIUM 8.7 10/12/2018 0819   PROT 6.6 10/12/2018 0819   ALBUMIN 4.1 10/12/2018 0819   AST 20 10/12/2018 0819   ALT 13 10/12/2018 0819   ALKPHOS 35 (L) 10/12/2018 0819   BILITOT 0.2 10/12/2018 0819       Component Value Date/Time   WBC 5.9 01/29/2016 0834   RBC 4.85 01/29/2016 0834   HGB 15.2 (H) 01/29/2016 0834   HCT 46.2 (H) 01/29/2016 0834   PLT 232.0 01/29/2016 0834   MCV 95.3 01/29/2016 0834   MCHC 33.0 01/29/2016 0834   RDW 14.7 01/29/2016 0834   LYMPHSABS 1.3 01/29/2016 0834   MONOABS 0.4 01/29/2016 0834   EOSABS 0.3 01/29/2016 0834   BASOSABS 0.0 01/29/2016 0834    No results found for: POCLITH, LITHIUM   No results found for: PHENYTOIN, PHENOBARB, VALPROATE, CBMZ   .res Assessment: Plan:    No diagnosis found.  Please see After Visit Summary for patient specific instructions.  Greater than 50% of face to face time with patient was spent on counseling and coordination of care. We discussed TRD and anxiety but has partial response with the higher dosage of  mirtazapine 60mg  HS. she has a long list of failed psychiatric meds.  She is not tended to do well with higher dosages of SSRIs.  Tolerating mirtazapine. The increase  Her chronic anxiety has been fairly treatment resistant but she is probably doing the best she can do.  Difference between grief and depression Disc in detail.  Increase the Ritalin to 1-1/2 tablets or 15 mg 3 times a day.  After 2 or 3 weeks if you feel it is necessary and is well-tolerated then you can increase to 2 tablets 3 times a day and inform us if that has been helpful.  Off label option of selegiline and MAOI restrictions disc in detail.  TMS also now approved for OCD.Marland Kitchen.  She is continuing psychotherapy with Dr. Farrel DemarkMitchum  This appointment was 40 minutes.  Follow-up 8 months  Meredith Staggersarey Cottle MD, DFAPA  Future Appointments  Date Time Provider Department Center  12/19/2018  9:00 AM Robley FriesMitchum, Robert, PhD [facility] None  02/03/2019  4:00 PM Fontaine, Nadyne Coombesimothy P, MD [facility]  04/13/2019  8:30 AM [facility] None  04/15/2019  8:30 AM Reather LittlerKumar, Ajay, MD [facility] None    No orders of the defined types were placed in this encounter.     -------------------------------

## 2018-12-17 ENCOUNTER — Other Ambulatory Visit: Payer: Self-pay | Admitting: Psychiatry

## 2018-12-19 ENCOUNTER — Ambulatory Visit (INDEPENDENT_AMBULATORY_CARE_PROVIDER_SITE_OTHER): Payer: BLUE CROSS/BLUE SHIELD | Admitting: Psychiatry

## 2018-12-19 DIAGNOSIS — F331 Major depressive disorder, recurrent, moderate: Secondary | ICD-10-CM | POA: Diagnosis not present

## 2018-12-19 DIAGNOSIS — F422 Mixed obsessional thoughts and acts: Secondary | ICD-10-CM | POA: Diagnosis not present

## 2018-12-19 DIAGNOSIS — F4010 Social phobia, unspecified: Secondary | ICD-10-CM | POA: Diagnosis not present

## 2018-12-19 DIAGNOSIS — Z634 Disappearance and death of family member: Secondary | ICD-10-CM
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
## 2018-12-25 ENCOUNTER — Encounter: Payer: Self-pay | Admitting: Gynecology

## 2018-12-25 DIAGNOSIS — Z1231 Encounter for screening mammogram for malignant neoplasm of breast: Secondary | ICD-10-CM | POA: Diagnosis not present

## 2019-01-23 ENCOUNTER — Ambulatory Visit: Payer: BLUE CROSS/BLUE SHIELD | Admitting: Psychiatry

## 2019-01-26 DIAGNOSIS — H02814 Retained foreign body in left upper eyelid: Secondary | ICD-10-CM | POA: Diagnosis not present

## 2019-01-29 DIAGNOSIS — H16142 Punctate keratitis, left eye: Secondary | ICD-10-CM | POA: Diagnosis not present

## 2019-02-01 ENCOUNTER — Other Ambulatory Visit: Payer: Self-pay

## 2019-02-01 ENCOUNTER — Other Ambulatory Visit: Payer: Self-pay | Admitting: Psychiatry

## 2019-02-01 ENCOUNTER — Telehealth: Payer: Self-pay | Admitting: Psychiatry

## 2019-02-01 DIAGNOSIS — F900 Attention-deficit hyperactivity disorder, predominantly inattentive type: Secondary | ICD-10-CM

## 2019-02-01 MED ORDER — METHYLPHENIDATE HCL 20 MG PO TABS: 20.0000 mg | ORAL_TABLET | Freq: Three times a day (TID) | ORAL | 0 refills | Status: DC

## 2019-02-01 NOTE — Telephone Encounter (Signed)
Methylphenidate needs RF-  Is on 20mg  1 tid now. Please send in to [facility]  [address]

## 2019-02-01 NOTE — Progress Notes (Signed)
Patient reports taking methylphenidate 20 mg 3 times daily.  She is a compliant patient.  Okay to refill for 90 days

## 2019-02-02 ENCOUNTER — Encounter: Payer: BLUE CROSS/BLUE SHIELD | Admitting: Gynecology

## 2019-02-03 ENCOUNTER — Encounter: Payer: Self-pay | Admitting: Gynecology

## 2019-02-03 ENCOUNTER — Ambulatory Visit: Payer: BLUE CROSS/BLUE SHIELD | Admitting: Gynecology

## 2019-02-03 ENCOUNTER — Encounter: Payer: BLUE CROSS/BLUE SHIELD | Admitting: Gynecology

## 2019-02-03 ENCOUNTER — Other Ambulatory Visit: Payer: Self-pay

## 2019-02-03 VITALS — BP 118/76 | Ht 63.0 in | Wt 117.0 lb

## 2019-02-03 DIAGNOSIS — Z01419 Encounter for gynecological examination (general) (routine) without abnormal findings: Secondary | ICD-10-CM

## 2019-02-03 DIAGNOSIS — N952 Postmenopausal atrophic vaginitis: Secondary | ICD-10-CM | POA: Diagnosis not present

## 2019-02-03 DIAGNOSIS — M810 Age-related osteoporosis without current pathological fracture: Secondary | ICD-10-CM | POA: Diagnosis not present

## 2019-02-03 NOTE — Progress Notes (Signed)
    Ashlee Chavez Jul 06, 1961 [id number]        58 y.o.  G0P0 for annual gynecologic exam.  Without gynecologic complaints  Past medical history,surgical history, problem list, medications, allergies, family history and social history were all reviewed and documented as reviewed in the EPIC chart.  ROS:  Performed with pertinent positives and negatives included in the history, assessment and plan.   Additional significant findings : None   Exam: Kennon Portela assistant Vitals:   02/03/19 0933  BP: 118/76  Weight: 117 lb (53.1 kg)  Height: 5\' 3"  (1.6 m)   Body mass index is 20.73 kg/m.  General appearance:  Normal affect, orientation and appearance. Skin: Grossly normal HEENT: Without gross lesions.  No cervical or supraclavicular adenopathy. Thyroid normal.  Lungs:  Clear without wheezing, rales or rhonchi Cardiac: RR, without RMG Abdominal:  Soft, nontender, without masses, guarding, rebound, organomegaly or hernia Breasts:  Examined lying and sitting without masses, retractions, discharge or axillary adenopathy. Pelvic:  Ext, BUS, Vagina: With atrophic changes.  Upper vaginal constriction band noted as previously  Cervix: With atrophic changes  Uterus: Anteverted, normal size, shape and contour, midline and mobile nontender   Adnexa: Without masses or tenderness    Anus and perineum: Normal   Rectovaginal: Normal sphincter tone without palpated masses or tenderness.    Assessment/Plan:  58 y.o. G0P0 female for annual gynecologic exam.   1. Postmenopausal.  No significant menopausal symptoms or any vaginal bleeding. 2. Postmenopausal osteoporosis.  DEXA 2017 T score -2.7.  Started on Actonel at that time by Dr. Lucianne Muss who is following her for her bone health.  Recommend follow-up DEXA now which we will forward the results to him for management. 3. Mammography 12/2018.  Continue with annual mammography when due.  Breast exam normal today. 4. Pap smear/HPV 2018.  No Pap smear  done today.  History LGSIL 2010 with normal Pap smears since.  Plan repeat Pap smear/HPV at 5-year interval per current screening guidelines. 5. Colonoscopy 2012 with planned repeat 2022. 6. Health maintenance.  No routine lab work done as patient does this elsewhere.  Follow-up 1 year, sooner as needed.   Dara Lords MD, 10:06 AM 02/03/2019

## 2019-02-03 NOTE — Patient Instructions (Signed)
Schedule in follow-up for the bone density.  Follow-up in 1 year for annual exam

## 2019-02-10 ENCOUNTER — Encounter: Payer: Self-pay | Admitting: Psychiatry

## 2019-02-10 ENCOUNTER — Ambulatory Visit: Payer: BLUE CROSS/BLUE SHIELD | Admitting: Psychiatry

## 2019-02-10 DIAGNOSIS — F422 Mixed obsessional thoughts and acts: Secondary | ICD-10-CM | POA: Diagnosis not present

## 2019-02-10 DIAGNOSIS — F4010 Social phobia, unspecified: Secondary | ICD-10-CM

## 2019-02-10 DIAGNOSIS — F900 Attention-deficit hyperactivity disorder, predominantly inattentive type: Secondary | ICD-10-CM | POA: Diagnosis not present

## 2019-02-10 DIAGNOSIS — F331 Major depressive disorder, recurrent, moderate: Secondary | ICD-10-CM

## 2019-02-10 MED ORDER — METHYLPHENIDATE HCL ER (OSM) 36 MG PO TBCR: 72.0000 mg | EXTENDED_RELEASE_TABLET | Freq: Every day | ORAL | 0 refills | Status: DC

## 2019-02-10 NOTE — Progress Notes (Signed)
Ashlee Chavez [id number] [birth year] 58 y.o.  Subjective:   Patient ID:  Ashlee Chavez is a 58 y.o. (DOB 1961/01/03) female.  Chief Complaint:  Chief Complaint  Patient presents with  . Depression  . ADHD  . Anxiety  . Follow-up    med changes    HPI AMILLIAN SKEHAN presents today for follow-up of recently worse depression DT relationship loss.   She was last seen December 16, 2018.  She felt the need to increase Ritalin so therefore it was increased to 20 mg 3 times daily.   Just increased 4 weeks at Ritalin 20 TID.  Depression is a little better at times 30-40% better.  Wonders about increase.    Alone.  Not close to B and S who live elsewhere.  Talks to God daily.  Gets to work daily.  Function is good there.  [name] and describes anxiety as Moderate. Anxiety symptoms include: Excessive Worry, Obsessive Compulsive Symptoms:   Handwashing,,. Less anxiety with the OCD in the past.  Pt reports no sleep issues. Pt reports that appetite is good. Pt reports that energy is good and down slightly. Concentration is no change. Suicidal thoughts:  denied by patient.  Numerous failed psychiatric medication trials include Lexapro 60 mg a day, Zoloft 250,  Abilify between 1 and 2.5 mg and up to 15 mg with no response, buspirone 30 mg, clomipramine, Cytomel, Wellbutrin 300 with side effects, lithium 625, Paxil with weight gain, pramipexole with insomnia,  Lunesta with crying spells, topiramate, Deplin, propranolol, Rexulti which helped initially, Vraylar, Ritalin. No history of Prozac, Viibryd.  Review of Systems:  Review of Systems  Constitutional: Negative for diaphoresis and fatigue.  Neurological: Negative for tremors and weakness.  Psychiatric/Behavioral: Negative for agitation, behavioral problems, confusion, decreased concentration, dysphoric mood, hallucinations, self-injury, sleep disturbance and suicidal ideas. The patient is not  nervous/anxious and is not hyperactive.     Medications: I have reviewed the patient's current medications.  Current Outpatient Medications  Medication Sig Dispense Refill  . Acetylcysteine (NAC) 600 MG CAPS Take 1,200 mg by mouth 2 (two) times daily.    Marland Kitchen ALPRAZolam (XANAX) 0.25 MG tablet Take 0.25 mg by mouth daily as needed for anxiety.     Marland Kitchen atorvastatin (LIPITOR) 20 MG tablet TAKE ONE TABLET BY MOUTH ONCE DAILY 90 tablet 1  . Calcium Carbonate-Vitamin D (OSCAL 500/200 D-3 PO) Take by mouth.    . Cholecalciferol (VITAMIN D PO) Take by mouth.    . levothyroxine (SYNTHROID, LEVOTHROID) 112 MCG tablet TAKE 1 TABLET (112 MCG TOTAL) BY MOUTH DAILY BEFORE BREAKFAST. 30 tablet 3  . methylphenidate (RITALIN) 20 MG tablet Take 1 tablet (20 mg total) by mouth 3 (three) times daily. 270 tablet 0  . mirtazapine (REMERON) 30 MG tablet Take 2 tablets (60 mg total) by mouth at bedtime. 180 tablet 1  . PARoxetine (PAXIL) 40 MG tablet Take 1 tablet (40 mg total) by mouth 2 (two) times daily. 180 tablet 1  . risedronate (ACTONEL) 150 MG tablet TAKE 1 TABLET EVERY 30 DAYS WITH WATER ON EMPTY STOMACH, NOTHING BY MOUTH OR LIE DOWN FOR 3 tablet 1  . SUMAtriptan (IMITREX) 100 MG tablet Take 100 mg by mouth daily as needed for migraine.    . topiramate (TOPAMAX) 100 MG tablet TAKE 1 TABLET BY MOUTH TWICE A DAY 180 tablet 0   No current facility-administered medications for this visit.     Medication Side  Effects: None  Allergies:  Allergies  Allergen Reactions  . Ampicillin Rash    Past Medical History:  Diagnosis Date  . Gum lesion    Biopsy done-benign  . Hypercholesteremia   . Hypothyroid   . LGSIL (low grade squamous intraepithelial dysplasia) 2010  . Migraines   . OCD (obsessive compulsive disorder)   . Osteoporosis 01/2016   T score -2.7    Family History  Problem Relation Age of Onset  . Hypertension Mother   . Heart disease Mother   . Hypertension Father   . Breast cancer  Paternal Aunt        Age 26  . Diabetes Neg Hx     Social History   Socioeconomic History  . Marital status: Single    Spouse name: Not on file  . Number of children: Not on file  . Years of education: Not on file  . Highest education level: Not on file  Occupational History  . Not on file  Social Needs  . Financial resource strain: Not on file  . Food insecurity:    Worry: Not on file    Inability: Not on file  . Transportation needs:    Medical: Not on file    Non-medical: Not on file  Tobacco Use  . Smoking status: Never Smoker  . Smokeless tobacco: Never Used  Substance and Sexual Activity  . Alcohol use: Yes    Alcohol/week: 0.0 standard drinks    Comment: wine occassionally  . Drug use: No  . Sexual activity: Never    Birth control/protection: Post-menopausal    Comment: 1st intercourse 13 yo-5 partners  Lifestyle  . Physical activity:    Days per week: Not on file    Minutes per session: Not on file  . Stress: Not on file  Relationships  . Social connections:    Talks on phone: Not on file    Gets together: Not on file    Attends religious service: Not on file    Active member of club or organization: Not on file    Attends meetings of clubs or organizations: Not on file    Relationship status: Not on file  . Intimate partner violence:    Fear of current or ex partner: Not on file    Emotionally abused: Not on file    Physically abused: Not on file    Forced sexual activity: Not on file  Other Topics Concern  . Not on file  Social History Narrative  . Not on file    Past Medical History, Surgical history, Social history, and Family history were reviewed and updated as appropriate.   Please see review of systems for further details on the patient's review from today.   Objective:   Physical Exam:  LMP 11/19/2010   Physical Exam Neurological:     Mental Status: She is alert and oriented to person, place, and time.     Cranial Nerves: No  dysarthria.  Psychiatric:        Attention and Perception: Attention normal.        Mood and Affect: Mood is anxious and depressed.        Speech: Speech normal.        Behavior: Behavior is cooperative.        Thought Content: Thought content normal. Thought content is not paranoid or delusional. Thought content does not include homicidal or suicidal ideation. Thought content does not include homicidal or suicidal plan.  Cognition and Memory: Cognition and memory normal.        Judgment: Judgment normal.     Lab Review:     Component Value Date/Time   NA 140 10/12/2018 0819   K 4.1 10/12/2018 0819   CL 109 10/12/2018 0819   CO2 22 10/12/2018 0819   GLUCOSE 89 10/12/2018 0819   BUN 16 10/12/2018 0819   CREATININE 0.79 10/12/2018 0819   CALCIUM 8.7 10/12/2018 0819   PROT 6.6 10/12/2018 0819   ALBUMIN 4.1 10/12/2018 0819   AST 20 10/12/2018 0819   ALT 13 10/12/2018 0819   ALKPHOS 35 (L) 10/12/2018 0819   BILITOT 0.2 10/12/2018 0819       Component Value Date/Time   WBC 5.9 01/29/2016 0834   RBC 4.85 01/29/2016 0834   HGB 15.2 (H) 01/29/2016 0834   HCT 46.2 (H) 01/29/2016 0834   PLT 232.0 01/29/2016 0834   MCV 95.3 01/29/2016 0834   MCHC 33.0 01/29/2016 0834   RDW 14.7 01/29/2016 0834   LYMPHSABS 1.3 01/29/2016 0834   MONOABS 0.4 01/29/2016 0834   EOSABS 0.3 01/29/2016 0834   BASOSABS 0.0 01/29/2016 0834    No results found for: POCLITH, LITHIUM   No results found for: PHENYTOIN, PHENOBARB, VALPROATE, CBMZ   .res Assessment: Plan:    Major depressive disorder, recurrent episode, moderate (HCC)  Attention deficit hyperactivity disorder (ADHD), predominantly inattentive type  Social anxiety disorder  Mixed obsessional thoughts and acts  Please see After Visit Summary for patient specific instructions.  Greater than 50% of face to face time with patient was spent on counseling and coordination of care. We discussed TRD and anxiety but has partial  response with the higher dosage of mirtazapine 60mg  HS. she has a long list of failed psychiatric meds.  She is not tended to do well with higher dosages of SSRIs.  Tolerating mirtazapine. The increase  Her chronic anxiety has been fairly treatment resistant but she is probably doing the best she can do.  She has seen mood benefit from the Ritalin especially at 20 mg 3 times daily.  She notices the crash associated with the medicine.  Therefore we will go to a long-acting form  Disc pros and cons and dosage options.  Will change to concerta 36 2 each AM.  Off label option of selegiline and MAOI restrictions disc in detail.  TMS also now approved for OCD.Marland Kitchen We will continue her other medications for anxiety.  She is aware that she is on a high dose but she is tolerating them and gained additional benefit from the higher dosages.  She is aware of the risk of serotonin syndrome.  She is continuing psychotherapy with Dr. Farrel Demark  This appointment was 30 minutes.  Follow-up 3 months  I connected with patient by a video enabled telemedicine application or telephone, with their informed consent, and verified patient privacy and that I am speaking with the correct person using two identifiers.  I was located at office and patient at home.    Meredith Staggers MD, DFAPA  Future Appointments  Date Time Provider Department Center  02/13/2019  9:00 AM Robley Fries, PhD [facility] None  04/13/2019  8:30 AM [facility] None  04/15/2019  8:30 AM Reather Littler, MD [facility] None  02/04/2020  3:30 PM Fontaine, Nadyne Coombes, MD [facility]    No orders of the defined types were placed in this encounter.     -------------------------------

## 2019-02-13 ENCOUNTER — Ambulatory Visit (INDEPENDENT_AMBULATORY_CARE_PROVIDER_SITE_OTHER): Payer: BLUE CROSS/BLUE SHIELD | Admitting: Psychiatry

## 2019-02-13 DIAGNOSIS — F331 Major depressive disorder, recurrent, moderate: Secondary | ICD-10-CM | POA: Diagnosis not present

## 2019-02-13 DIAGNOSIS — F422 Mixed obsessional thoughts and acts: Secondary | ICD-10-CM

## 2019-02-13 DIAGNOSIS — F4010 Social phobia, unspecified: Secondary | ICD-10-CM | POA: Diagnosis not present

## 2019-02-13 NOTE — Progress Notes (Signed)
Psychotherapy Progress Note [facility] [name], PhD LP  Patient ID: Ashlee Chavez     MRN: [id number]     Therapy format: Individual psychotherapy Date: 02/13/2019     Start: 9:07a Stop: 9:53a Time Spent: 46 min  I connected with patient by Ashlee Chavez, with her informed consent, and verified patient privacy and identity.  I was located at home and patient at home.  Attempted video, but her email facility not receiving video invitation.  Connected video partway through by alternate email.  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  Programmer, applications working effectively, just user errors.  Challenge getting caregivers their supplies for protection with COVID-related supply chain issues.  [name] has given out shortsighted advice about making their own masks and has her going to craft stores without knowing exactly what's needed.  It is very helpful for workflow working from home, as officemates cannot walk up and interrupt.  Better light in home office as well.  As an introvert, lifestyle not so different.  Does call people, brother and sister don't initiate calls.  Checks on church friends and coworkers, all nonfamily receptive.  Effectively making short work of worry and sadness, gets up and does something about it.  Continues devotions, reading nightly.  OCD not preoccupied with COVID.  It only ever concerned itself with HIV, which was bound to sexual intimacy concerns.  Following standard advice on precautions.  Re. depression, Ritalin was increased this week, just started timed-release.  No complaints about lacking or delayed effect, just hoped it would lift depression better.  Educated on what to expect of a  stimulant, namely, energy to do, decide, or see things she needs.  Mood will follow from results of what she does and control over anxiety, which tends to keep working in the background.  Interpreted her "depression" as a response to chronic tension, which is exhausting.  Taught brief relaxer using distance vision and breathing technique, advised to use for several times a day.  One of the cats likes to perch on her lap, very relaxing.  Also advised on finding and making several "breathers' in the flow of her day to break up generalized anxiety and fatiguing tension, pointing out that the best relaxer for her is to have more perceived control over the flow of responsibilities, as her OCD and all other things seem to hinge most on the acuity and saturation of feeling under the gun to please others and see herself be responsible to them.  Agrees, will pursue mini-breaks including freedom to pause responsibilities, gaze at distance, and release muscles consciously.  Reviewed sleep quality and activity.  Sleep measures and light control all in place.  Out of exercise habit without gym.  Dicussed possibilities within the confines of her home and living complex.    Therapeutic modalities: Cognitive Behavioral Therapy and Solution-Oriented/Positive Psychology  Mental Status/Observations:  Appearance:   Casual and Neat     Behavior:  Appropriate  Motor:  Normal  Speech/Language:   Clear and Coherent  Affect:  Appropriate  Mood:  normal  Thought process:  normal  Thought content:    WNL  Sensory/Perceptual disturbances:    WNL  Orientation:  within normal limits  Attention:  Good  Concentration:  Good  Memory:  WNL  Insight:    Good  Judgment:   Good  Impulse Control:  Good   Risk Assessment: Danger to Self:  No Self-injurious Behavior: No Danger to Others: No Duty to Warn:no Physical Aggression / Violence:No  Access to Firearms a concern: No   Diagnosis:   ICD-10-CM   1. Mixed obsessional  thoughts and acts F42.2   2. Social anxiety disorder F40.10   3. Major depressive disorder, recurrent episode, moderate (HCC) F33.1    Assessment of progress:  improving  Plan:  . Prioritize several mini-breaks to pause sense of acute responsibility that goes to bodily tension. . Try to limber up and stretch regularly if not aerobic exercise  . Maintain good lighting for work days, either location . Self-remind about medication's role and expectations . Other recommendations/advice as noted above . Continue to utilize previously learned skills ad lib . Maintain medication as prescribed and work faithfully with relevant prescriber(s) if any changes are desired or seem indicated . Call the clinic on-call service, present to ER, or call 911 if any life-threatening psychiatric crisis Return in about 1 month (around 03/15/2019) for will call.   Robley Fries, PhD [place] Licensed Psychologist

## 2019-03-11 ENCOUNTER — Other Ambulatory Visit: Payer: Self-pay | Admitting: Psychiatry

## 2019-03-15 ENCOUNTER — Other Ambulatory Visit: Payer: Self-pay

## 2019-03-15 ENCOUNTER — Telehealth: Payer: Self-pay | Admitting: Psychiatry

## 2019-03-15 MED ORDER — METHYLPHENIDATE HCL ER (OSM) 36 MG PO TBCR: 72.0000 mg | EXTENDED_RELEASE_TABLET | Freq: Every day | ORAL | 0 refills | Status: DC

## 2019-03-15 NOTE — Telephone Encounter (Signed)
Pended for approval.

## 2019-03-15 NOTE — Telephone Encounter (Signed)
Koraline called to request refill of her Concerta.  Next appt 05/12/19.  Send to [facility] - [address]

## 2019-03-17 ENCOUNTER — Ambulatory Visit (INDEPENDENT_AMBULATORY_CARE_PROVIDER_SITE_OTHER): Payer: BLUE CROSS/BLUE SHIELD | Admitting: Psychiatry

## 2019-03-17 ENCOUNTER — Other Ambulatory Visit: Payer: Self-pay

## 2019-03-17 DIAGNOSIS — F4010 Social phobia, unspecified: Secondary | ICD-10-CM

## 2019-03-17 DIAGNOSIS — F422 Mixed obsessional thoughts and acts: Secondary | ICD-10-CM

## 2019-03-17 DIAGNOSIS — Z634 Disappearance and death of family member: Secondary | ICD-10-CM

## 2019-03-17 DIAGNOSIS — F331 Major depressive disorder, recurrent, moderate: Secondary | ICD-10-CM | POA: Diagnosis not present

## 2019-03-17 DIAGNOSIS — Z638 Other specified problems related to primary support group: Secondary | ICD-10-CM

## 2019-03-17 NOTE — Progress Notes (Signed)
Psychotherapy Progress Note [facility] [name], PhD LP  Patient ID: Ashlee Chavez     MRN: [id number]     Therapy format: Individual psychotherapy Date: 03/17/2019     Start: 5:01p Stop: 5:48p Time Spent: 47 min  Telehealth visit I connected with patient by a video enabled telemedicine/telehealth application or telephone, with her informed consent, and verified patient privacy and that I am speaking with the correct person using two identifiers.  I was located at my home and patient at her home.  We discussed the limitations, risks, and security and privacy concerns associated with telehealth services and the availability of in-person appointments, including awareness that she may be responsible for charges related to the service, and she expressed understanding and agreed to proceed.  I discussed treatment planning with her, with opportunity to ask and answer all questions. Agreed with the plan, demonstrated an understanding of the instructions, and made her aware to call our office if symptoms worsen or she feels she is in a crisis state and needs immediate contact.  Session narrative (presenting needs, interim history, self-report of stressors and symptoms, applications of prior therapy, status changes, and interventions made in session) Getting some home projects done, growing some seeds, staining furniture.  Calls her brother and sister, they don't initiate, coming to terms with it just being unequal that way.  Can get mad still at [facility] for forfeiting her life (see her untimely death a few months back).  May feel moved to talk to her (a la gestalt, or via letter, per prior [place] rec's) but not yet, says she is too angry with her yet for allowing herself to die too young.  Suggested Ashlee Chavez merely say that much to her, and feel free to tell her she will not be talking to her soon, just to begin unlocking unfinished feelings.  Another empathetic stress in seeing her friend  Fannie Knee suffering from her husband laying around at this time.  Irked at [employer] for doing things like drinking in the afternoon and FaceTiming Ashlee Chavez during work hours.  Has had to confront online and otherwise ignore messages.    Has been going into the office; with social distancing and work from home she is doing a whole lot less.  Work mates also continue to vary in their work Associate Professor, Ashlee Chavez continuing to do her job to [employer] and thankfully not in touch so much to be given tasks others have sloughed off.  Validated as lonely the experience of seeming like the only adult in the room communicating with these people.  Applying patience and the practice of handing off to God before too frustrated.    Has followed advice to create some more exercise opportunity and stretching to combat ergonomic stress with computer time.  Consciously pacing herself with home projects, making a practice of it being OK to do today's piece rather than have to think too far ahead or work too long.  Is consciously letting her gaze go outside and further away from time to time, to reduce stress and sense of confinement working from home.  Therapeutic modalities: Cognitive Behavioral Therapy and Ego-Supportive  Mental Status/Observations:  Appearance:   Casual     Behavior:  Appropriate  Motor:  Normal  Speech/Language:   Clear and Coherent  Affect:  Appropriate  Mood:  normal and appropriate to topic  Thought process:  normal  Thought content:    WNL  Sensory/Perceptual disturbances:    WNL  Orientation:  grossly intact  Attention:  Good  Concentration:  Good  Memory:  WNL  Insight:    Good  Judgment:   Good  Impulse Control:  Good   Risk Assessment: Danger to Self:  No Self-injurious Behavior: No Danger to Others: No Duty to Warn:no Physical Aggression / Violence:No  Access to Firearms a concern: No   Diagnosis:   ICD-10-CM   1. Mixed obsessional thoughts and acts F42.2   2. Social anxiety disorder F40.10   3.  Major depressive disorder, recurrent episode, moderate (HCC) F33.1   4. Bereavement Z63.4   5. Relationship problem with family member Z63.8    Assessment of progress:  improving  Plan:  . Advised tell [name]'s picture not just "I'm too mad" but "I'm too hurt" . Other recommendations/advice as noted above . Continue to utilize previously learned skills ad lib . Maintain medication as prescribed and work faithfully with relevant prescriber(s) if any changes are desired or seem indicated . Call the clinic on-call service, present to ER, or call 911 if any life-threatening psychiatric crisis Return in about 1 month (around 04/17/2019) for as available, will call.   Robley Fries, PhD [place] Licensed Psychologist

## 2019-04-07 ENCOUNTER — Other Ambulatory Visit: Payer: Self-pay | Admitting: Endocrinology

## 2019-04-13 ENCOUNTER — Other Ambulatory Visit: Payer: Self-pay

## 2019-04-13 ENCOUNTER — Other Ambulatory Visit (INDEPENDENT_AMBULATORY_CARE_PROVIDER_SITE_OTHER): Payer: BC Managed Care – PPO

## 2019-04-13 DIAGNOSIS — E7800 Pure hypercholesterolemia, unspecified: Secondary | ICD-10-CM | POA: Diagnosis not present

## 2019-04-13 DIAGNOSIS — R7302 Impaired glucose tolerance (oral): Secondary | ICD-10-CM

## 2019-04-13 DIAGNOSIS — E559 Vitamin D deficiency, unspecified: Secondary | ICD-10-CM | POA: Diagnosis not present

## 2019-04-13 DIAGNOSIS — E063 Autoimmune thyroiditis: Secondary | ICD-10-CM | POA: Diagnosis not present

## 2019-04-13 LAB — LIPID PANEL
Cholesterol: 180 mg/dL (ref 0–200)
HDL: 67.9 mg/dL (ref >39.00)
LDL Cholesterol: 92 mg/dL (ref 0–99)
NonHDL: 112.1
Total CHOL/HDL Ratio: 3
Triglycerides: 100 mg/dL (ref 0.0–149.0)
VLDL: 20 mg/dL (ref 0.0–40.0)

## 2019-04-13 LAB — COMPREHENSIVE METABOLIC PANEL
ALT: 16 U/L (ref 0–35)
AST: 21 U/L (ref 0–37)
Albumin: 4.1 g/dL (ref 3.5–5.2)
Alkaline Phosphatase: 40 U/L (ref 39–117)
BUN: 17 mg/dL (ref 6–23)
CO2: 27 mEq/L (ref 19–32)
Calcium: 9.2 mg/dL (ref 8.4–10.5)
Chloride: 108 mEq/L (ref 96–112)
Creatinine, Ser: 0.65 mg/dL (ref 0.40–1.20)
GFR: 93.14 mL/min (ref >60.00)
Glucose, Bld: 88 mg/dL (ref 70–99)
Potassium: 4.1 mEq/L (ref 3.5–5.1)
Sodium: 141 mEq/L (ref 135–145)
Total Bilirubin: 0.2 mg/dL (ref 0.2–1.2)
Total Protein: 6.7 g/dL (ref 6.0–8.3)

## 2019-04-13 LAB — T4, FREE: Free T4: 0.7 ng/dL (ref 0.60–1.60)

## 2019-04-13 LAB — HEMOGLOBIN A1C: Hgb A1c MFr Bld: 6.1 % (ref 4.6–6.5)

## 2019-04-13 LAB — TSH: TSH: 1.59 u[IU]/mL (ref 0.35–4.50)

## 2019-04-13 LAB — VITAMIN D 25 HYDROXY (VIT D DEFICIENCY, FRACTURES): VITD: 48.34 ng/mL (ref 30.00–100.00)

## 2019-04-14 NOTE — Progress Notes (Signed)
Patient ID: Ashlee Chavez, female   DOB: 07/18/1961, 58 y.o.   MRN: [id number]           Chief complaint: Endocrinology follow-up  History of Present Illness:   Today's office visit was provided via telemedicine using video technique Explained to the patient and the the limitations of evaluation and management by telemedicine and the availability of in person appointments.  The patient understood the limitations and agreed to proceed. Patient also understood that the telehealth visit is billable. . Location of the patient: Home . Location of the provider: Office Only the patient and myself were participating in the encounter     OSTEOPOROSIS:   She had a bone density done in 2017 and this showed the following T-scores: Femoral neck: -2.7, previously -1.8 Spine: -1.9  By comparison she had a decline of 8-10 % in her bone density from 2 years before and 15% result from baseline She initially had a half inch height loss from baseline  She could not afford Evista and did not want to consider Reclast because of the possible high one time cost  In 7/17 she was started on treatment with risedronate 150 mg She has had no adverse effects and is taking this once a month regularly  She is taking calcium supplements with Os-Cal. Also continues to be taking 5000 units of vitamin D with adequate vitamin D levels  She is going to be scheduled for follow-up bone density by gynecologist  LABS:  Lab Results  Component Value Date   VD25OH 48.34 04/13/2019   VD25OH 32.27 11/18/2017    HYPOTHYROIDISM:  She had a goiter diagnosed in 1996. She had a positive peroxidase antibody and has been on thyroxine since 1996  Her TSH was high in 2006 and she had symptoms of fatigue, cold intolerance and some hair loss  Since then has been on 75 mcg of Synthroid with occasional adjustments in the dose  Since 01/2014 she had been taking the same dose of levothyroxine, 75 g daily  Her TSH was  higher than usual in 11/16 and she was having complaints about depression, insomnia, fatigue and more difficulties with her anxiety  Has had chronic cold sensitivity   Her dose was increased in 7/17 and in 1/19 it was further increased to to 137 mcg because of a high TSH She was not having any unusual fatigue but her TSH was gradually increasing She was initially having some palpitations and shakiness but no symptoms on the visit when she was on 125 mcg  Recent history:  Her levothyroxine dose was reduced progressively down to 112 mcg in 03/2018  She feels fairly good, does not complain of feeling tired Has no unusual cold intolerance or weight change   Compliance with taking her prescription has been consistent with taking the levothyroxine tablet in the morning before breakfast.  She will take her multivitamin and calcium separately  She prefers to take generic levothyroxine  TSH is normal again    Wt Readings from Last 3 Encounters:  02/03/19 117 lb (53.1 kg)  10/14/18 118 lb (53.5 kg)  06/08/18 114 lb 6.4 oz (51.9 kg)    Lab Results  Component Value Date   TSH 1.59 04/13/2019   TSH 2.44 10/12/2018   TSH 1.67 06/04/2018   FREET4 0.70 04/13/2019   FREET4 0.57 (L) 10/12/2018   FREET4 0.70 06/04/2018    OTHER active problems discussed and review of systems   Past Medical History:  Diagnosis Date  .  Gum lesion    Biopsy done-benign  . Hypercholesteremia   . Hypothyroid   . LGSIL (low grade squamous intraepithelial dysplasia) 2010  . Migraines   . OCD (obsessive compulsive disorder)   . Osteoporosis 01/2016   T score -2.7    Past Surgical History:  Procedure Laterality Date  . BREAST LUMPECTOMY  2001   right-benign  . CATARACT EXTRACTION  2005  . Gum Biopsy     Benign-2019  `  Family History  Problem Relation Age of Onset  . Hypertension Mother   . Heart disease Mother   . Hypertension Father   . Breast cancer Paternal Aunt        Age 68  . Diabetes  Neg Hx     Social History:  reports that she has never smoked. She has never used smokeless tobacco. She reports current alcohol use. She reports that she does not use drugs.  Allergies:  Allergies  Allergen Reactions  . Ampicillin Rash    Allergies as of 04/15/2019      Reactions   Ampicillin Rash      Medication List       Accurate as of April 14, 2019  9:15 PM. If you have any questions, ask your nurse or doctor.        ALPRAZolam 0.25 MG tablet Commonly known as:  XANAX Take 0.25 mg by mouth daily as needed for anxiety.   atorvastatin 20 MG tablet Commonly known as:  LIPITOR TAKE ONE TABLET BY MOUTH ONCE DAILY   levothyroxine 112 MCG tablet Commonly known as:  SYNTHROID TAKE 1 TABLET (112 MCG TOTAL) BY MOUTH DAILY BEFORE BREAKFAST.   methylphenidate 20 MG tablet Commonly known as:  RITALIN Take 1 tablet (20 mg total) by mouth 3 (three) times daily.   methylphenidate 36 MG CR tablet Commonly known as:  Concerta Take 2 tablets (72 mg total) by mouth daily.   methylphenidate 36 MG CR tablet Commonly known as:  Concerta Take 2 tablets (72 mg total) by mouth daily.   methylphenidate 36 MG CR tablet Commonly known as:  Concerta Take 2 tablets (72 mg total) by mouth daily. Start taking on:  May 10, 2019   mirtazapine 30 MG tablet Commonly known as:  REMERON Take 2 tablets (60 mg total) by mouth at bedtime.   NAC 600 MG Caps Generic drug:  Acetylcysteine Take 1,200 mg by mouth 2 (two) times daily.   OSCAL 500/200 D-3 PO Take by mouth.   PARoxetine 40 MG tablet Commonly known as:  PAXIL Take 1 tablet (40 mg total) by mouth 2 (two) times daily.   risedronate 150 MG tablet Commonly known as:  ACTONEL TAKE 1 TABLET EVERY 30 DAYS WITH WATER ON EMPTY STOMACH, NOTHING BY MOUTH OR LIE DOWN FOR   SUMAtriptan 100 MG tablet Commonly known as:  IMITREX Take 100 mg by mouth daily as needed for migraine.   topiramate 100 MG tablet Commonly known as:   TOPAMAX TAKE 1 TABLET BY MOUTH TWICE A DAY   VITAMIN D PO Take by mouth.        Review of Systems   HYPERCHOLESTEROLEMIA: She has been on Lipitor 20 mg for several years  LDL improved and now below 100 She has no other risk factors besides hypercholesterolemia  Still has a low-fat diet  Lab Results  Component Value Date   CHOL 180 04/13/2019   HDL 67.90 04/13/2019   LDLCALC 92 04/13/2019   TRIG 100.0 04/13/2019  CHOLHDL 3 04/13/2019    GLUCOSE: Her fasting glucose has been consistently normal  However A1c is in prediabetic range again although slightly lower She has been asked to exercise but she does not really do much  No family history of diabetes  Lab Results  Component Value Date   HGBA1C 6.1 04/13/2019   HGBA1C 6.3 10/12/2018   HGBA1C 6.0 02/17/2018   Lab Results  Component Value Date   LDLCALC 92 04/13/2019   CREATININE 0.65 04/13/2019      PHYSICAL EXAM:  LMP 11/19/2010   No exam today  ASSESSMENT:    HYPOTHYROIDISM:   She has primary hypothyroidism with a small goiter  Currently has had stable thyroid supplement requirement and is doing well with 112 mcg levothyroxine She is feeling subjectively very well  She is taking her generic levothyroxine consistently before breakfast TSH is normal and consistent  Prediabetic A1c: This may be falsely high since her fasting glucose is consistently below 100 and she does not have a family history of diabetes  Osteoporosis: She will have a follow-up bone density with the gynecologist, Continue Actonel monthly No change in vitamin D supplement  LIPIDS: Adequately controlled on Lipitor   PLAN:    Continue levothyroxine 112 mcg once daily  Will await results of bone density when available  No change in other medications or supplements  Reassured her that since A1c is likely falsely increased will not do a glucose tolerance test at this time  However encouraged her to start a walking  program for exercise  Follow-up in 6 months  She will also be establishing with a PCP about the end of the year     Reather Littler 04/14/2019, 9:15 PM

## 2019-04-15 ENCOUNTER — Ambulatory Visit (INDEPENDENT_AMBULATORY_CARE_PROVIDER_SITE_OTHER): Payer: BC Managed Care – PPO | Admitting: Endocrinology

## 2019-04-15 ENCOUNTER — Encounter: Payer: Self-pay | Admitting: Endocrinology

## 2019-04-15 ENCOUNTER — Other Ambulatory Visit: Payer: Self-pay

## 2019-04-15 DIAGNOSIS — E7800 Pure hypercholesterolemia, unspecified: Secondary | ICD-10-CM

## 2019-04-15 DIAGNOSIS — R7302 Impaired glucose tolerance (oral): Secondary | ICD-10-CM | POA: Diagnosis not present

## 2019-04-15 DIAGNOSIS — E063 Autoimmune thyroiditis: Secondary | ICD-10-CM | POA: Diagnosis not present

## 2019-04-15 DIAGNOSIS — M810 Age-related osteoporosis without current pathological fracture: Secondary | ICD-10-CM | POA: Diagnosis not present

## 2019-04-22 ENCOUNTER — Ambulatory Visit: Payer: BC Managed Care – PPO | Admitting: Psychiatry

## 2019-04-22 ENCOUNTER — Other Ambulatory Visit: Payer: Self-pay

## 2019-04-22 DIAGNOSIS — F422 Mixed obsessional thoughts and acts: Secondary | ICD-10-CM

## 2019-04-22 DIAGNOSIS — F4010 Social phobia, unspecified: Secondary | ICD-10-CM

## 2019-04-22 DIAGNOSIS — F331 Major depressive disorder, recurrent, moderate: Secondary | ICD-10-CM | POA: Diagnosis not present

## 2019-04-22 DIAGNOSIS — Z634 Disappearance and death of family member: Secondary | ICD-10-CM

## 2019-04-22 NOTE — Progress Notes (Signed)
Psychotherapy Progress Note [facility] 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  As per usual, the rules seem to be vague, but thankful to be fully employed.  Brother and sister don't call, routinely.  Some concern for sister, who works late in the vicinity of [place].  Brother has demurred about coming down.  Still in touch with friend Ronalee Belts in [place], who has had some very brittle episodes lately, reacting to the history of her breaking up with him, replays overworked romantic fantasies and tragic, blaming thinking.  Recognizes the futility of trying to explain to him and able to let him simmer down.  Discussed purposeful activity.  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  Therapeutic modalities: Cognitive Behavioral Therapy and Solution-Oriented/Positive Psychology  Mental Status/Observations:  Appearance:   Casual     Behavior:  Appropriate  Motor:  Normal  Speech/Language:   Clear and Coherent  Affect:  Appropriate  Mood:  dysthymic and responsive to [place], doing OK despite current hardships  Thought process:  normal  Thought content:    WNL  Sensory/Perceptual disturbances:    WNL  Orientation:  grossly intact  Attention:  Good  Concentration:  Good  Memory:  WNL  Insight:    Good  Judgment:   Good  Impulse Control:  Good   Risk Assessment: Danger to Self:  No Self-injurious Behavior: No Danger to Others: No Duty to Warn:no Physical  Aggression / Violence:No  Access to Firearms a concern: No   Diagnosis:   ICD-10-CM   1. Mixed obsessional thoughts and acts  F42.2   2. Social anxiety disorder  F40.10   3. Major depressive disorder, recurrent episode, moderate (HCC)  F33.1   4. Bereavement  Z63.4    Assessment of progress:  stable  Plan:  . Check into telephone volunteer opportunity . Other recommendations/advice as noted above . Continue to utilize previously learned skills ad lib . Maintain medication as prescribed and work faithfully with relevant prescriber(s) if any changes are desired or seem indicated . Call the clinic on-call service, present to ER, or call 911 if any life-threatening psychiatric crisis Return in about 1 month (around 05/22/2019).   Blanchie Serve, PhD Luan Moore, PhD LP Clinical Psychologist, [facility] [address] [place], [place] [address] [phone]

## 2019-04-24 ENCOUNTER — Other Ambulatory Visit: Payer: Self-pay | Admitting: Psychiatry

## 2019-05-04 DIAGNOSIS — G43719 Chronic migraine without aura, intractable, without status migrainosus: Secondary | ICD-10-CM | POA: Diagnosis not present

## 2019-05-04 DIAGNOSIS — G43019 Migraine without aura, intractable, without status migrainosus: Secondary | ICD-10-CM | POA: Diagnosis not present

## 2019-05-12 ENCOUNTER — Encounter: Payer: Self-pay | Admitting: Psychiatry

## 2019-05-12 ENCOUNTER — Ambulatory Visit: Payer: BLUE CROSS/BLUE SHIELD | Admitting: Psychiatry

## 2019-05-12 ENCOUNTER — Other Ambulatory Visit: Payer: Self-pay

## 2019-05-12 DIAGNOSIS — F422 Mixed obsessional thoughts and acts: Secondary | ICD-10-CM | POA: Diagnosis not present

## 2019-05-12 DIAGNOSIS — Z634 Disappearance and death of family member: Secondary | ICD-10-CM

## 2019-05-12 DIAGNOSIS — F900 Attention-deficit hyperactivity disorder, predominantly inattentive type: Secondary | ICD-10-CM

## 2019-05-12 DIAGNOSIS — F4010 Social phobia, unspecified: Secondary | ICD-10-CM | POA: Diagnosis not present

## 2019-05-12 DIAGNOSIS — F331 Major depressive disorder, recurrent, moderate: Secondary | ICD-10-CM | POA: Diagnosis not present

## 2019-05-12 MED ORDER — METHYLPHENIDATE HCL ER (OSM) 36 MG PO TBCR: 72.0000 mg | EXTENDED_RELEASE_TABLET | Freq: Every day | ORAL | 0 refills | Status: DC

## 2019-05-12 NOTE — Progress Notes (Signed)
Ashlee HashimotoJanet C Chavez [id number] 01/28/1961 58 y.o.  Subjective:   Patient ID:  Ashlee Chavez is a 58 y.o. (DOB 06/16/1961) female.  Chief Complaint:  No chief complaint on file.   HPI Ashlee Chavez presents today for follow-up of recently worse depression DT relationship loss.   Last seen February 10, 2019.  She had received some benefit from Ritalin 20 mg 3 times daily but was having issues with the crash.  We switched to Concerta 72 mg every morning.  Better overall with more even response to stimulant and better appetite.  No SE.  Duration all day.  Less desperate hopeless feelings on the stimulant.  Stressful times.  Not unusual levels of anxiety.  Lost her best friend 6 mos ago and still grieving.  Not much family. Alone.  Not close to B and S who live elsewhere.  Talks to God daily.  Gets to work daily.  Function is good there.  Reads and calls people.  OCD no worse with Covid.  [name] and describes anxiety as Moderate. Anxiety symptoms include: Excessive Worry, Obsessive Compulsive Symptoms:   Handwashing,,. Less anxiety with the OCD in the past.  Pt reports no sleep issues, it's better 7-8 hours. Pt reports that appetite is good. Pt reports that energy is good and down slightly. Concentration is no change. Suicidal thoughts:  denied by patient.  Numerous failed psychiatric medication trials include Lexapro 60 mg a day, Zoloft 250,  Abilify between 1 and 2.5 mg and up to 15 mg with no response, buspirone 30 mg, clomipramine, Cytomel, Wellbutrin 300 with side effects, lithium 625, Paxil with weight gain, pramipexole with insomnia,  Lunesta with crying spells, topiramate, Deplin, propranolol, Rexulti which helped initially, Vraylar, Ritalin. No history of Prozac, Viibryd.  Review of Systems:  Review of Systems  Constitutional: Negative for diaphoresis and fatigue.  Neurological: Negative for tremors and weakness.  Psychiatric/Behavioral: Negative  for agitation, behavioral problems, confusion, decreased concentration, hallucinations, self-injury, sleep disturbance and suicidal ideas. The patient is not hyperactive.     Medications: I have reviewed the patient's current medications.  Current Outpatient Medications  Medication Sig Dispense Refill  . Acetylcysteine (NAC) 600 MG CAPS Take 1,200 mg by mouth 2 (two) times daily.    Marland Kitchen. ALPRAZolam (XANAX) 0.25 MG tablet Take 0.25 mg by mouth daily as needed for anxiety.     Marland Kitchen. atorvastatin (LIPITOR) 20 MG tablet TAKE ONE TABLET BY MOUTH ONCE DAILY 90 tablet 1  . Calcium Carbonate-Vitamin D (OSCAL 500/200 D-3 PO) Take by mouth.    . Cholecalciferol (VITAMIN D PO) Take by mouth.    . levothyroxine (SYNTHROID, LEVOTHROID) 112 MCG tablet TAKE 1 TABLET (112 MCG TOTAL) BY MOUTH DAILY BEFORE BREAKFAST. 30 tablet 3  . methylphenidate (CONCERTA) 36 MG PO CR tablet Take 2 tablets (72 mg total) by mouth daily. 60 tablet 0  . methylphenidate (CONCERTA) 36 MG PO CR tablet Take 2 tablets (72 mg total) by mouth daily. 60 tablet 0  . methylphenidate (CONCERTA) 36 MG PO CR tablet Take 2 tablets (72 mg total) by mouth daily. 60 tablet 0  . methylphenidate (RITALIN) 20 MG tablet Take 1 tablet (20 mg total) by mouth 3 (three) times daily. 270 tablet 0  . mirtazapine (REMERON) 30 MG tablet Take 2 tablets (60 mg total) by mouth at bedtime. 180 tablet 1  . PARoxetine (PAXIL) 40 MG tablet TAKE 1 TABLET BY MOUTH TWICE A DAY 180 tablet 1  .  risedronate (ACTONEL) 150 MG tablet TAKE 1 TABLET EVERY 30 DAYS WITH WATER ON EMPTY STOMACH, NOTHING BY MOUTH OR LIE DOWN FOR 30MIN 3 tablet 1  . SUMAtriptan (IMITREX) 100 MG tablet Take 100 mg by mouth daily as needed for migraine.    . topiramate (TOPAMAX) 100 MG tablet TAKE 1 TABLET BY MOUTH TWICE A DAY 180 tablet 0   No current facility-administered medications for this visit.     Medication Side Effects: None  Allergies:  Allergies  Allergen Reactions  . Ampicillin Rash     Past Medical History:  Diagnosis Date  . Gum lesion    Biopsy done-benign  . Hypercholesteremia   . Hypothyroid   . LGSIL (low grade squamous intraepithelial dysplasia) 2010  . Migraines   . OCD (obsessive compulsive disorder)   . Osteoporosis 01/2016   T score -2.7    Family History  Problem Relation Age of Onset  . Hypertension Mother   . Heart disease Mother   . Hypertension Father   . Breast cancer Paternal Aunt        Age 60  . Diabetes Neg Hx     Social History   Socioeconomic History  . Marital status: Single    Spouse name: Not on file  . Number of children: Not on file  . Years of education: Not on file  . Highest education level: Not on file  Occupational History  . Not on file  Social Needs  . Financial resource strain: Not on file  . Food insecurity    Worry: Not on file    Inability: Not on file  . Transportation needs    Medical: Not on file    Non-medical: Not on file  Tobacco Use  . Smoking status: Never Smoker  . Smokeless tobacco: Never Used  Substance and Sexual Activity  . Alcohol use: Yes    Alcohol/week: 0.0 standard drinks    Comment: wine occassionally  . Drug use: No  . Sexual activity: Never    Birth control/protection: Post-menopausal    Comment: 1st intercourse 58 yo-5 partners  Lifestyle  . Physical activity    Days per week: Not on file    Minutes per session: Not on file  . Stress: Not on file  Relationships  . Social Musicianconnections    Talks on phone: Not on file    Gets together: Not on file    Attends religious service: Not on file    Active member of club or organization: Not on file    Attends meetings of clubs or organizations: Not on file    Relationship status: Not on file  . Intimate partner violence    Fear of current or ex partner: Not on file    Emotionally abused: Not on file    Physically abused: Not on file    Forced sexual activity: Not on file  Other Topics Concern  . Not on file  Social History  Narrative  . Not on file    Past Medical History, Surgical history, Social history, and Family history were reviewed and updated as appropriate.   Please see review of systems for further details on the patient's review from today.   Objective:   Physical Exam:  LMP 11/19/2010   Physical Exam Constitutional:      General: She is not in acute distress.    Appearance: She is well-developed.  Musculoskeletal:        General: No deformity.  Neurological:  Mental Status: She is alert and oriented to person, place, and time.     Coordination: Coordination normal.  Psychiatric:        Attention and Perception: She is attentive. She does not perceive auditory hallucinations.        Mood and Affect: Mood is anxious and depressed. Affect is not labile, blunt, angry or inappropriate.        Speech: Speech normal.        Behavior: Behavior normal.        Thought Content: Thought content normal. Thought content does not include homicidal or suicidal ideation. Thought content does not include homicidal or suicidal plan.        Cognition and Memory: Cognition normal.        Judgment: Judgment normal.     Comments: Insight intact. No auditory or visual hallucinations. No delusions.  Chronic residual obsession and compulsions     Lab Review:     Component Value Date/Time   NA 141 04/13/2019 0849   K 4.1 04/13/2019 0849   CL 108 04/13/2019 0849   CO2 27 04/13/2019 0849   GLUCOSE 88 04/13/2019 0849   BUN 17 04/13/2019 0849   CREATININE 0.65 04/13/2019 0849   CALCIUM 9.2 04/13/2019 0849   PROT 6.7 04/13/2019 0849   ALBUMIN 4.1 04/13/2019 0849   AST 21 04/13/2019 0849   ALT 16 04/13/2019 0849   ALKPHOS 40 04/13/2019 0849   BILITOT 0.2 04/13/2019 0849       Component Value Date/Time   WBC 5.9 01/29/2016 0834   RBC 4.85 01/29/2016 0834   HGB 15.2 (H) 01/29/2016 0834   HCT 46.2 (H) 01/29/2016 0834   PLT 232.0 01/29/2016 0834   MCV 95.3 01/29/2016 0834   MCHC 33.0 01/29/2016  0834   RDW 14.7 01/29/2016 0834   LYMPHSABS 1.3 01/29/2016 0834   MONOABS 0.4 01/29/2016 0834   EOSABS 0.3 01/29/2016 0834   BASOSABS 0.0 01/29/2016 0834    No results found for: POCLITH, LITHIUM   No results found for: PHENYTOIN, PHENOBARB, VALPROATE, CBMZ   .res Assessment: Plan:    Ahliya was seen today for depression, anxiety and follow-up.  Diagnoses and all orders for this visit:  Major depressive disorder, recurrent episode, moderate (HCC)  Mixed obsessional thoughts and acts  Social anxiety disorder  Attention deficit hyperactivity disorder (ADHD), predominantly inattentive type -     methylphenidate (CONCERTA) 36 MG PO CR tablet; Take 2 tablets (72 mg total) by mouth daily. -     methylphenidate (CONCERTA) 36 MG PO CR tablet; Take 2 tablets (72 mg total) by mouth daily. -     methylphenidate (CONCERTA) 36 MG PO CR tablet; Take 2 tablets (72 mg total) by mouth daily.  Bereavement    Please see After Visit Summary for patient specific instructions.  Greater than 50% of face to face time with patient was spent on counseling and coordination of care. We discussed TRD and anxiety but has partial response with the higher dosage of mirtazapine 60mg  HS. she has a long list of failed psychiatric meds.  She is not tended to do well with higher dosages of SSRIs.  Tolerating mirtazapine. The increase  Her chronic anxiety has been fairly treatment resistant but she is probably doing the best she can do. Specifically she has residual depression and residual OCD and residual social anxiety but other than trying an MAO inhibitor there are no obvious alternatives.  Benefit so continue Concerta 36 2 each AM.  Off label option of selegiline and MAOI restrictions disc in detail.  This is a fairly risky medicine change.  TMS also now approved for OCD.Marland Kitchen. We will continue her other medications for anxiety.  She is aware that she is on a high dose but she is tolerating them and gained  additional benefit from the higher dosages.  She is aware of the risk of serotonin syndrome.  She is continuing psychotherapy with Dr. Farrel DemarkMitchum  No med changes indicated.  Follow-up 4 months  Meredith Staggersarey Cottle MD, DFAPA  Future Appointments  Date Time Provider Department Center  05/20/2019  4:00 PM Robley FriesMitchum, Robert, PhD [facility] None  10/26/2019  8:00 AM [facility] None  10/29/2019  8:00 AM Reather LittlerKumar, Ajay, MD [facility] None  02/04/2020  3:30 PM Fontaine, Nadyne Coombesimothy P, MD [facility]    No orders of the defined types were placed in this encounter.     -------------------------------

## 2019-05-20 ENCOUNTER — Other Ambulatory Visit: Payer: Self-pay

## 2019-05-20 ENCOUNTER — Ambulatory Visit: Payer: BC Managed Care – PPO | Admitting: Psychiatry

## 2019-05-20 DIAGNOSIS — F331 Major depressive disorder, recurrent, moderate: Secondary | ICD-10-CM

## 2019-05-20 DIAGNOSIS — Z638 Other specified problems related to primary support group: Secondary | ICD-10-CM | POA: Diagnosis not present

## 2019-05-20 DIAGNOSIS — F422 Mixed obsessional thoughts and acts: Secondary | ICD-10-CM

## 2019-05-20 DIAGNOSIS — F4010 Social phobia, unspecified: Secondary | ICD-10-CM

## 2019-05-20 NOTE — Progress Notes (Signed)
Psychotherapy Progress Note [facility] 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  Imbalance in the business threatens to kill bonus PT counts on, and boss's management allows for some people to get special treatment.  Communication failures and assumptions among her office peers become frustrating.  Will be losing one liked peer.  Support provided.  Meanwhile, still no contact initiated by brother or sister.  [name]'s daughter Bethann Berkshire visited, had lunch. pleasant time except for more news of [name]'s husband's behavior, bumming inheritance money from [employer], buying a motorcycle, and two-faced communications.  Selectively assertive about it, without particular anxiety.  Meanwhile, trying to [facility] her house with these low rates.  Doing some apps to make supplemental money.  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  Commended on her industry and problem-solving.  Therapeutic modalities: Cognitive Behavioral Therapy, Assertiveness/Communication and Solution-Oriented/Positive Psychology  Mental Status/Observations:  Appearance:   Neat     Behavior:  Appropriate  Motor:  Normal  Speech/Language:   Clear and Coherent  Affect:  Appropriate and restricted but responsive  Mood:  dysthymic  Thought process:  normal  Thought content:    worries  and some rumination  Sensory/Perceptual disturbances:    WNL  Orientation:  grossly intact  Attention:  Good  Concentration:  Good  Memory:  WNL  Insight:    Good  Judgment:   Good  Impulse Control:  Good   Risk Assessment: Danger to Self: No Self-injurious Behavior: No Danger to Others: No Physical Aggression / Violence: No Duty to Warn: No Access to Firearms a concern: No  Assessment of progress:  stabilized  Diagnosis:   ICD-10-CM   1. Major depressive disorder, recurrent episode, moderate (HCC)  F33.1   2. Mixed obsessional thoughts and acts  F42.2   3. Social anxiety disorder  F40.10   4. Relationship problem with family member  Z63.8     Plan:  . Try to make short work of meditating on feeling alone and just continue any tolerable efforts to get in touch with friends, family, church-mates or dating opportunities because it's OK to do so . Other recommendations/advice as noted above . Continue to utilize previously learned skills ad lib . Maintain medication as prescribed and work faithfully with relevant prescriber(s) if any changes are desired or seem indicated . Call the clinic on-call service, present to ER, or call 911 if any life-threatening psychiatric crisis Return in about 1 month (around 06/20/2019).   Blanchie Serve, PhD Luan Moore, PhD LP Clinical Psychologist, [facility] [address] [place], [place] [address] [phone]

## 2019-06-01 ENCOUNTER — Other Ambulatory Visit: Payer: Self-pay | Admitting: Endocrinology

## 2019-06-05 ENCOUNTER — Other Ambulatory Visit: Payer: Self-pay | Admitting: Psychiatry

## 2019-06-10 ENCOUNTER — Other Ambulatory Visit: Payer: Self-pay | Admitting: Endocrinology

## 2019-06-24 DIAGNOSIS — H524 Presbyopia: Secondary | ICD-10-CM | POA: Diagnosis not present

## 2019-06-30 ENCOUNTER — Other Ambulatory Visit: Payer: Self-pay

## 2019-06-30 ENCOUNTER — Ambulatory Visit: Payer: BC Managed Care – PPO | Admitting: Psychiatry

## 2019-06-30 DIAGNOSIS — F331 Major depressive disorder, recurrent, moderate: Secondary | ICD-10-CM | POA: Diagnosis not present

## 2019-06-30 DIAGNOSIS — F900 Attention-deficit hyperactivity disorder, predominantly inattentive type: Secondary | ICD-10-CM | POA: Diagnosis not present

## 2019-06-30 DIAGNOSIS — F4010 Social phobia, unspecified: Secondary | ICD-10-CM | POA: Diagnosis not present

## 2019-06-30 DIAGNOSIS — F422 Mixed obsessional thoughts and acts: Secondary | ICD-10-CM | POA: Diagnosis not present

## 2019-06-30 NOTE — Progress Notes (Signed)
Psychotherapy Progress Note [facility] Ashlee Chavez, Ashlee Chavez  Patient ID: Ashlee Chavez     MRN: [id number]     Therapy format: Individual psychotherapy Date: 06/30/2019     Start: 5:10p Stop: 6:00p Time Spent: 50 min Location: in-person   Session narrative (presenting needs, interim history, self-report of stressors and symptoms, applications of prior therapy, status changes, and interventions made in session) Work is lonely, alienated, but settled.  Doesn't bother to ask any more for answers or input on double standards and office rules after so many non-answers.  Continues to see others get away with time off and lack of accountability, but less pressure to do their jobs.  Wants to talk over [place] today.  Nearly two years since the awkward, unclear breakup, finds herself repeatedly thinking about him,wondering why he hasn't gotten the implied message and called to renew the relationship apologize for double messages and going back on encouraging   Has gone back on [employer] site looking for opportunities, which are, of course, altered during the pandemic.  [employer] (reading tutoring) a possibility, maybe [employer] (pickup food donations).  Has been inspired by some testimonial about a family dealing with COVID.  Encouraged further, as desired, in perusing dating site and interacting   Sleep broken, chronically, up 3-4 /night.  Began fish oil Saturday -- encouraged as promising for anti-inflammation.  Willing to add melatonin, even if she hates the next pill.  Oriented to MIND Diet to help with inflammation and blue light control to encourage timely sleep readiness.  Encouraged both to help remedy physiological factors maintaining depression.  Therapeutic modalities: Cognitive Behavioral Therapy, Psycho-education/Bibliotherapy and Interpersonal  Mental Status/Observations:  Appearance:   Casual     Behavior:  Appropriate  Motor:  Normal   Speech/Language:   Clear and Coherent  Affect:  Appropriate  Mood:  dysthymic  Thought process:  normal  Thought content:    Rumination  Sensory/Perceptual disturbances:    WNL  Orientation:  grossly intact  Attention:  Good  Concentration:  Good  Memory:  WNL  Insight:    Good  Judgment:   Good  Impulse Control:  Good   Risk Assessment: Danger to Self: No Self-injurious Behavior: No Danger to Others: No Physical Aggression / Violence: No Duty to Warn: No Access to Firearms a concern: No  Assessment of progress:  stabilized  Diagnosis:   ICD-10-CM   1. Mixed obsessional thoughts and acts  F42.2   2. Social anxiety disorder  F40.10   3. Major depressive disorder, recurrent episode, moderate (HCC)  F33.1   4. Attention deficit hyperactivity disorder (ADHD), predominantly inattentive type  F90.0    unclear whether this is fundamental ADHD-I or great intereference from OCD and chronic sleep disturbance   Plan:  . Check out MIND Diet, recommended for depression . Option to blue light control or OTC melatonin for improved sleep . Other recommendations/advice as noted above . Continue to utilize previously learned skills ad lib . Maintain medication as prescribed and work faithfully with relevant prescriber(s) if any changes are desired or seem indicated . Call the clinic on-call service, present to ER, or call 911 if any life-threatening psychiatric crisis Return in about 1 month (around 07/31/2019).  Blanchie Serve, Ashlee Ashlee Chavez, Ashlee Chavez Clinical Psychologist, [facility] [address] [place], [place] [address] [phone]

## 2019-07-14 ENCOUNTER — Other Ambulatory Visit: Payer: Self-pay | Admitting: Psychiatry

## 2019-08-10 ENCOUNTER — Encounter: Payer: Self-pay | Admitting: Gynecology

## 2019-08-24 ENCOUNTER — Other Ambulatory Visit: Payer: Self-pay

## 2019-08-24 ENCOUNTER — Ambulatory Visit (INDEPENDENT_AMBULATORY_CARE_PROVIDER_SITE_OTHER): Payer: BC Managed Care – PPO | Admitting: Psychiatry

## 2019-08-24 DIAGNOSIS — F331 Major depressive disorder, recurrent, moderate: Secondary | ICD-10-CM

## 2019-08-24 DIAGNOSIS — Z638 Other specified problems related to primary support group: Secondary | ICD-10-CM

## 2019-08-24 DIAGNOSIS — F422 Mixed obsessional thoughts and acts: Secondary | ICD-10-CM | POA: Diagnosis not present

## 2019-08-24 DIAGNOSIS — F5102 Adjustment insomnia: Secondary | ICD-10-CM

## 2019-08-24 DIAGNOSIS — F4010 Social phobia, unspecified: Secondary | ICD-10-CM

## 2019-08-24 NOTE — Progress Notes (Signed)
Psychotherapy Progress Note [facility] Luan Moore, PhD LP  Patient ID: Ashlee Chavez     MRN: [id number]     Therapy format: Individual psychotherapy Date: 08/24/2019     Start: 4:10p Stop: 5:00p Time Spent: 50 min Location: in-person   Session narrative (presenting needs, interim history, self-report of stressors and symptoms, applications of prior therapy, status changes, and interventions made in session) Idle time rolls into obsessing about [name] and what went wrong.  At home in evenings, it's just too quiet, too lonely with neither Eduard Clos nor Horris Latino available.  Has been invited to [employer], but can't get clear info on it.  Has continued on dating site, even if hampered by pandemic.   Hung up on obsessive looping about   Sister Jenny Reichmann has called again to check in, 2nd time in less than year and a pleasant surprise.  Is explicitly OK with "friend finding".  Very helpful to think of dating like card shopping -- OK to look, OK to sort, not a pejorative to say no.    Has set up blue light filters on all her electronics, started melatonin, and established regular bedtime -- sleep has responded.  Has put MIND Diet in play.  Now trying to find a reasonable workout app.  Commended on work done to regulate physiology better and drive down systemic influences exacerbating anxiety.  Therapeutic modalities: Cognitive Behavioral Therapy, Psycho-education/Bibliotherapy and Interpersonal  Mental Status/Observations:  Appearance:   Casual     Behavior:  Appropriate  Motor:  Normal  Speech/Language:   Clear and Coherent  Affect:  Appropriate  Mood:  dysthymic  Thought process:  normal  Thought content:    Rumination  Sensory/Perceptual disturbances:    WNL  Orientation:  grossly intact  Attention:  Good  Concentration:  Good  Memory:  WNL  Insight:    Good  Judgment:   Good  Impulse Control:  Good   Risk Assessment: Danger to Self: No Self-injurious Behavior:  No Danger to Others: No Physical Aggression / Violence: No Duty to Warn: No Access to Firearms a concern: No  Assessment of progress:  stabilized  Diagnosis:   ICD-10-CM   1. Mixed obsessional thoughts and acts  F42.2   2. Social anxiety disorder  F40.10   3. Major depressive disorder, recurrent episode, moderate (HCC)  F33.1   4. Relationship problem with family member  Z63.8   5. 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 Check out MIND Diet, recommended for depression . Option to blue light control or OTC melatonin for improved sleep . Other recommendations/advice as noted above . Continue to utilize previously learned skills ad lib . Maintain medication as prescribed and work faithfully with relevant prescriber(s) if any changes are desired or seem indicated . Call the clinic on-call service, present to ER, or call 911 if any life-threatening psychiatric crisis Return in about 6 weeks (around 10/05/2019).  Blanchie Serve, PhD Luan Moore, PhD LP Clinical Psychologist, [facility] [address], [place], [place] [address] [phone]

## 2019-09-14 ENCOUNTER — Ambulatory Visit (INDEPENDENT_AMBULATORY_CARE_PROVIDER_SITE_OTHER): Payer: BC Managed Care – PPO | Admitting: Psychiatry

## 2019-09-14 ENCOUNTER — Other Ambulatory Visit: Payer: Self-pay

## 2019-09-14 ENCOUNTER — Encounter: Payer: Self-pay | Admitting: Psychiatry

## 2019-09-14 VITALS — BP 113/79 | HR 85

## 2019-09-14 DIAGNOSIS — F4010 Social phobia, unspecified: Secondary | ICD-10-CM

## 2019-09-14 DIAGNOSIS — F900 Attention-deficit hyperactivity disorder, predominantly inattentive type: Secondary | ICD-10-CM

## 2019-09-14 DIAGNOSIS — F331 Major depressive disorder, recurrent, moderate: Secondary | ICD-10-CM

## 2019-09-14 DIAGNOSIS — F422 Mixed obsessional thoughts and acts: Secondary | ICD-10-CM | POA: Diagnosis not present

## 2019-09-14 MED ORDER — METHYLPHENIDATE HCL ER (OSM) 36 MG PO TBCR: 72.0000 mg | EXTENDED_RELEASE_TABLET | Freq: Every day | ORAL | 0 refills | Status: DC

## 2019-09-14 MED ORDER — RISPERIDONE 1 MG PO TABS: 1.0000 mg | ORAL_TABLET | Freq: Every day | ORAL | 1 refills | Status: DC

## 2019-09-14 NOTE — Patient Instructions (Addendum)
Risperidone 1/2 of 1 mg tablet nightly  for 1-2 weeks then 1 each night.  Option lamotrigine or Latuda

## 2019-09-14 NOTE — Progress Notes (Signed)
Ashlee Chavez [id number] [birth year] 58 y.o.  Subjective:   Patient ID:  Ashlee Chavez is a 58 y.o. (DOB 1961-10-05) female.  Chief Complaint:  Chief Complaint  Patient presents with  . Follow-up    Medication Management  . Depression    Medication Management    Depression        Associated symptoms include headaches.  Associated symptoms include no decreased concentration, no fatigue and no suicidal ideas.  Ashlee Chavez presents today for follow-up of recently worse depression DT relationship loss.    When seen February 10, 2019.  She had received some benefit from Ritalin 20 mg 3 times daily but was having issues with the crash.  We switched to Concerta 72 mg every morning.  Last visit July 1 no med changes.  Xanax rarely.  Not so good since here.  Covid related some.  OCD seems to be winning and focused on the goal of a new med.  Still working.  Always there.  Breakup 2 y ago and still obsess on it.  Tired of obsessing about it.  Better overall with more even response to stimulant and better appetite.  No SE.  Duration all day.  Less desperate hopeless feelings on the stimulant.  Stressful times.  Not unusual levels of anxiety.  Lost her best friend 1 year ago and still grieving.  Not much family. Alone.  Not close to B and S who live elsewhere.  Talks to God daily.  Gets to work daily.  Function is good there.  Reads and calls people.  OCD no worse with Covid.  [name] and describes anxiety as Moderate. Anxiety symptoms include: Excessive Worry, Obsessive Compulsive Symptoms:   Handwashing,,. Less anxiety with the OCD in the past.  Pt reports no sleep issues, it's better 7-8 hours. Pt reports that appetite is good. Pt reports that energy is good and down slightly. Concentration is no change. Suicidal thoughts:  denied by patient.  Numerous failed psychiatric medication trials include Lexapro 60 mg a day, Zoloft 250, Paxil with weight  gain,  Clomipramine, Rexulti which helped initially, Vraylar, Abilify between 1 and 2.5 mg and up to 15 mg with no response, buspirone 30 mg,   Cytomel, Wellbutrin 300 with side effects, lithium 625, pramipexole with insomnia,   Lunesta with crying spells, topiramate, Deplin, propranolol,  Ritalin. No history of Prozac, Viibryd.  Review of Systems:  Review of Systems  Constitutional: Negative for diaphoresis and fatigue.  Gastrointestinal: Positive for abdominal pain.  Neurological: Positive for headaches. Negative for dizziness, tremors and weakness.  Psychiatric/Behavioral: Positive for depression. Negative for agitation, behavioral problems, confusion, decreased concentration, hallucinations, self-injury, sleep disturbance and suicidal ideas. The patient is not hyperactive.     Medications: I have reviewed the patient's current medications.  Current Outpatient Medications  Medication Sig Dispense Refill  . Acetylcysteine (NAC) 600 MG CAPS Take 1,200 mg by mouth 2 (two) times daily.    Marland Kitchen ALPRAZolam (XANAX) 0.25 MG tablet Take 0.25 mg by mouth daily as needed for anxiety.     Marland Kitchen atorvastatin (LIPITOR) 20 MG tablet TAKE ONE TABLET BY MOUTH ONCE DAILY 90 tablet 1  . Calcium Carbonate-Vitamin D (OSCAL 500/200 D-3 PO) Take by mouth.    . Cholecalciferol (VITAMIN D PO) Take by mouth.    . levothyroxine (SYNTHROID) 112 MCG tablet TAKE 1 TABLET (112 MCG TOTAL) BY MOUTH DAILY BEFORE BREAKFAST. 90 tablet 1  . methylphenidate (CONCERTA) 36  MG PO CR tablet Take 2 tablets (72 mg total) by mouth daily. 60 tablet 0  . methylphenidate (CONCERTA) 36 MG PO CR tablet Take 2 tablets (72 mg total) by mouth daily. 60 tablet 0  . methylphenidate (CONCERTA) 36 MG PO CR tablet Take 2 tablets (72 mg total) by mouth daily. 60 tablet 0  . mirtazapine (REMERON) 30 MG tablet TAKE 2 TABLETS BY MOUTH AT BEDTIME 180 tablet 1  . PARoxetine (PAXIL) 40 MG tablet TAKE 1 TABLET BY MOUTH TWICE A DAY 180 tablet 1  .  risedronate (ACTONEL) 150 MG tablet TAKE 1 TABLET EVERY 30 DAYS WITH WATER ON EMPTY STOMACH, NOTHING BY MOUTH OR LIE DOWN FOR 3 tablet 1  . SUMAtriptan (IMITREX) 100 MG tablet Take 100 mg by mouth daily as needed for migraine.    . topiramate (TOPAMAX) 100 MG tablet TAKE 1 TABLET BY MOUTH TWICE A DAY 180 tablet 1  . risperiDONE (RISPERDAL) 1 MG tablet Take 1 tablet (1 mg total) by mouth at bedtime. 30 tablet 1   No current facility-administered medications for this visit.     Medication Side Effects: None  Allergies:  Allergies  Allergen Reactions  . Ampicillin Rash    Past Medical History:  Diagnosis Date  . Gum lesion    Biopsy done-benign  . Hypercholesteremia   . Hypothyroid   . LGSIL (low grade squamous intraepithelial dysplasia) 2010  . Migraines   . OCD (obsessive compulsive disorder)   . Osteoporosis 01/2016   T score -2.7    Family History  Problem Relation Age of Onset  . Hypertension Mother   . Heart disease Mother   . Hypertension Father   . Breast cancer Paternal Aunt        Age 64  . Diabetes Neg Hx     Social History   Socioeconomic History  . Marital status: Single    Spouse name: Not on file  . Number of children: Not on file  . Years of education: Not on file  . Highest education level: Not on file  Occupational History  . Not on file  Social Needs  . Financial resource strain: Not on file  . Food insecurity    Worry: Not on file    Inability: Not on file  . Transportation needs    Medical: Not on file    Non-medical: Not on file  Tobacco Use  . Smoking status: Never Smoker  . Smokeless tobacco: Never Used  Substance and Sexual Activity  . Alcohol use: Yes    Alcohol/week: 0.0 standard drinks    Comment: wine occassionally  . Drug use: No  . Sexual activity: Never    Birth control/protection: Post-menopausal    Comment: 1st intercourse 69 yo-5 partners  Lifestyle  . Physical activity    Days per week: Not on file    Minutes  per session: Not on file  . Stress: Not on file  Relationships  . Social Musician on phone: Not on file    Gets together: Not on file    Attends religious service: Not on file    Active member of club or organization: Not on file    Attends meetings of clubs or organizations: Not on file    Relationship status: Not on file  . Intimate partner violence    Fear of current or ex partner: Not on file    Emotionally abused: Not on file    Physically abused:  Not on file    Forced sexual activity: Not on file  Other Topics Concern  . Not on file  Social History Narrative  . Not on file    Past Medical History, Surgical history, Social history, and Family history were reviewed and updated as appropriate.   Please see review of systems for further details on the patient's review from today.   Objective:   Physical Exam:  BP 113/79   Pulse 85   LMP 11/19/2010   Physical Exam Constitutional:      General: She is not in acute distress.    Appearance: She is well-developed.  Musculoskeletal:        General: No deformity.  Neurological:     Mental Status: She is alert and oriented to person, place, and time.     Coordination: Coordination normal.  Psychiatric:        Attention and Perception: She is attentive. She does not perceive auditory hallucinations.        Mood and Affect: Mood is anxious and depressed. Affect is not labile, blunt, angry or inappropriate.        Speech: Speech normal.        Behavior: Behavior normal.        Thought Content: Thought content normal. Thought content does not include homicidal or suicidal ideation. Thought content does not include homicidal or suicidal plan.        Cognition and Memory: Cognition normal.        Judgment: Judgment normal.     Comments: Insight intact. No auditory or visual hallucinations. No delusions.  Chronic residual obsession and compulsions     Lab Review:     Component Value Date/Time   NA 141  04/13/2019 0849   K 4.1 04/13/2019 0849   CL 108 04/13/2019 0849   CO2 27 04/13/2019 0849   GLUCOSE 88 04/13/2019 0849   BUN 17 04/13/2019 0849   CREATININE 0.65 04/13/2019 0849   CALCIUM 9.2 04/13/2019 0849   PROT 6.7 04/13/2019 0849   ALBUMIN 4.1 04/13/2019 0849   AST 21 04/13/2019 0849   ALT 16 04/13/2019 0849   ALKPHOS 40 04/13/2019 0849   BILITOT 0.2 04/13/2019 0849       Component Value Date/Time   WBC 5.9 01/29/2016 0834   RBC 4.85 01/29/2016 0834   HGB 15.2 (H) 01/29/2016 0834   HCT 46.2 (H) 01/29/2016 0834   PLT 232.0 01/29/2016 0834   MCV 95.3 01/29/2016 0834   MCHC 33.0 01/29/2016 0834   RDW 14.7 01/29/2016 0834   LYMPHSABS 1.3 01/29/2016 0834   MONOABS 0.4 01/29/2016 0834   EOSABS 0.3 01/29/2016 0834   BASOSABS 0.0 01/29/2016 0834    No results found for: POCLITH, LITHIUM   No results found for: PHENYTOIN, PHENOBARB, VALPROATE, CBMZ   .res Assessment: Plan:    Ashlee Chavez was seen today for follow-up and depression.  Diagnoses and all orders for this visit:  Mixed obsessional thoughts and acts -     risperiDONE (RISPERDAL) 1 MG tablet; Take 1 tablet (1 mg total) by mouth at bedtime.  Social anxiety disorder -     risperiDONE (RISPERDAL) 1 MG tablet; Take 1 tablet (1 mg total) by mouth at bedtime.  Major depressive disorder, recurrent episode, moderate (HCC)  Attention deficit hyperactivity disorder (ADHD), predominantly inattentive type    Please see After Visit Summary for patient specific instructions.  Greater than 50% of 30 min face to face time with patient was spent on counseling  and coordination of care. We discussed TRD and anxiety but has partial response with the higher dosage of mirtazapine 60mg  HS. she has a long list of failed psychiatric meds.  She is not tended to do well with higher dosages of SSRIs.  Tolerating mirtazapine. . She is more motivated to try alternatives than she has been in the past because of persistent bothersome OCD.   We discussed the off label use of antipsychotics for OCD.  She is tried some of them but has not tried the most commonly studied 1 which is risperidone.  We have avoided that because of the sedative and weight gain risk but could consider a very low dosage..  Benefit so continue Concerta 36 2 each AM.  Off label option of selegiline and MAOI restrictions disc in detail.  This is a fairly risky medicine change.  TMS also now approved for OCD.Marland Kitchen. We will continue her other medications for anxiety.  She is aware that she is on a high dose but she is tolerating them and gained additional benefit from the higher dosages.  She is aware of the risk of serotonin syndrome. Continue high dosage paroxetine 80 mg daily. Tolerated.  She is continuing psychotherapy with Dr. Farrel DemarkMitchum  Consider off label lamotrigine, Latuda, risperidone which has the best literature support.  Risperidone 1/2 mg daily for 1-2 weeks then 1 each night. Discussed potential metabolic side effects associated with atypical antipsychotics, as well as potential risk for movement side effects. Advised pt to contact office if movement side effects occur.  She agrees with the plan  Follow-up 4 weeks  Meredith Staggersarey Cottle MD, DFAPA  Future Appointments  Date Time Provider Department Center  10/04/2019  4:00 PM Robley FriesMitchum, Robert, PhD [facility] None  10/26/2019  8:00 AM [facility] None  10/29/2019  8:00 AM Reather LittlerKumar, Ajay, MD [facility] None  02/04/2020  3:30 PM Fontaine, Nadyne Coombesimothy P, MD [facility]    No orders of the defined types were placed in this encounter.     -------------------------------

## 2019-10-04 ENCOUNTER — Other Ambulatory Visit: Payer: Self-pay

## 2019-10-04 ENCOUNTER — Ambulatory Visit: Payer: BC Managed Care – PPO | Admitting: Psychiatry

## 2019-10-04 DIAGNOSIS — F422 Mixed obsessional thoughts and acts: Secondary | ICD-10-CM

## 2019-10-04 DIAGNOSIS — F4010 Social phobia, unspecified: Secondary | ICD-10-CM | POA: Diagnosis not present

## 2019-10-04 DIAGNOSIS — F331 Major depressive disorder, recurrent, moderate: Secondary | ICD-10-CM

## 2019-10-04 NOTE — Progress Notes (Signed)
Psychotherapy Progress Note [facility] Luan Moore, PhD LP  Patient ID: Ashlee Chavez     MRN: [id number]     Therapy format: Individual psychotherapy Date: 10/04/2019     Start: 4:29p Stop: 5:17p Time Spent: 48 min Location: in-person   Session narrative (presenting needs, interim history, self-report of stressors and symptoms, applications of prior therapy, status changes, and interventions made in session) Began Risperidone bedtime 3 weeks ago, titrated from 0.5 to 1.0 pills 2nd week, for help with obsessing over [name] and associated depression.  B and S cannot be the support F and friend Horris Latino used to be (1 year this week).  Feels it has helped establish peace, and the ability to coach self better.  Had been using word games to shut out her thoughts, which was a losing effort, the sadness got so overwhelming.  Spiritually, had to coach herself to remember God present and providing, until got back to Dr. Clovis Pu.  New med seems to be working well to take the urgency out things, sleeping considerably better.  [name]'s S sent flowers, much appreciated for the anniv of her loss.  Had an "Internet" date, day after 2-yr [place] of breaking up with [name].  Says dating site has been sending suspicious messages, she thinks may be bots, but this prospect persisted through apparent text malfunction, met at [facility].  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  He even surmised she had a safety contact trying to reach her and prompted her to answer, it was going so well.  Proud of herself for doing this, about 1 week into Risperdal (half dose).  Interested in seeing him again.  Seems to be significantly less stuck ruminating about last relationship.  Forecast that Risperdal dosing is probably at the right state now for her to make better strides behaviorally.  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  No bonuses, as predicted, so she has been supplementing income with a couple of phone apps ([employer] and [employer]) that pay for a few extras.  Satisfied with herself to find she can pre-fund things like a dental cleaning.    Now in [employer] shopping season also.  Considering alternative plans, incl an unfamiliar one that says it covers [facility].    Therapeutic modalities: Cognitive Behavioral Therapy, Solution-Oriented/Positive Psychology and Ego-Supportive  Mental Status/Observations:  Appearance:   Casual and Neat     Behavior:  Appropriate  Motor:  Normal  Speech/Language:   Clear and Coherent  Affect:  Appropriate  Mood:  less anxious, less depressed, more positive  Thought process:  normal  Thought content:    WNL  Sensory/Perceptual disturbances:    WNL  Orientation:  Fully oriented  Attention:  Good  Concentration:  Good  Memory:  WNL  Insight:    Good  Judgment:   Good  Impulse Control:  Good   Risk Assessment: Danger to Self: No Self-injurious Behavior: No Danger to Others: No Physical Aggression / Violence: No Duty to Warn: No Access to Firearms a concern: No  Assessment of progress:  progressing well  Diagnosis:   ICD-10-CM   1. Major depressive disorder, recurrent episode, moderate (HCC)  F33.1   2. Social anxiety disorder  F40.10   3. Mixed obsessional thoughts and acts  F42.2     Plan:  . Continue to pursue dating as desired . Work through seasonal life  administration tasks . Other recommendations/advice as noted above . Continue to utilize previously learned skills ad lib . Maintain medication as prescribed and work faithfully with relevant prescriber(s) if any changes are desired or seem indicated . Call the clinic on-call service, present to ER, or call 911 if any life-threatening psychiatric crisis Return in about 2 months (around 12/04/2019) for time as available. Current [facility] appointments: Future  Appointments  Date Time Provider [place]  10/14/2019  3:15 PM Cottle, Billey Co., MD [facility] None  10/26/2019  8:00 AM [facility] None  10/29/2019  8:00 AM Elayne Snare, MD [facility] None  11/24/2019  5:00 PM Blanchie Serve, PhD CP-CP None  02/04/2020  3:30 PM Fontaine, Belinda Block, MD [facility] Mariane Baumgarten    Blanchie Serve, PhD Luan Moore, PhD LP Clinical Psychologist, [facility] [address] [place], [place] [address] [phone]

## 2019-10-07 ENCOUNTER — Other Ambulatory Visit: Payer: Self-pay | Admitting: Psychiatry

## 2019-10-07 DIAGNOSIS — F422 Mixed obsessional thoughts and acts: Secondary | ICD-10-CM

## 2019-10-07 DIAGNOSIS — F4010 Social phobia, unspecified: Secondary | ICD-10-CM

## 2019-10-13 DIAGNOSIS — G43719 Chronic migraine without aura, intractable, without status migrainosus: Secondary | ICD-10-CM | POA: Diagnosis not present

## 2019-10-13 DIAGNOSIS — G43019 Migraine without aura, intractable, without status migrainosus: Secondary | ICD-10-CM | POA: Diagnosis not present

## 2019-10-14 ENCOUNTER — Ambulatory Visit (INDEPENDENT_AMBULATORY_CARE_PROVIDER_SITE_OTHER): Payer: BC Managed Care – PPO | Admitting: Psychiatry

## 2019-10-14 ENCOUNTER — Encounter: Payer: Self-pay | Admitting: Psychiatry

## 2019-10-14 ENCOUNTER — Other Ambulatory Visit: Payer: Self-pay

## 2019-10-14 DIAGNOSIS — F900 Attention-deficit hyperactivity disorder, predominantly inattentive type: Secondary | ICD-10-CM

## 2019-10-14 DIAGNOSIS — F4010 Social phobia, unspecified: Secondary | ICD-10-CM

## 2019-10-14 DIAGNOSIS — F5102 Adjustment insomnia: Secondary | ICD-10-CM

## 2019-10-14 DIAGNOSIS — F422 Mixed obsessional thoughts and acts: Secondary | ICD-10-CM

## 2019-10-14 DIAGNOSIS — F331 Major depressive disorder, recurrent, moderate: Secondary | ICD-10-CM | POA: Diagnosis not present

## 2019-10-14 MED ORDER — METHYLPHENIDATE HCL ER (OSM) 36 MG PO TBCR: 72.0000 mg | EXTENDED_RELEASE_TABLET | Freq: Every day | ORAL | 0 refills | Status: DC

## 2019-10-14 NOTE — Progress Notes (Signed)
Ashlee Chavez [id number] July 18, 1961 58 y.o.  Subjective:   Patient ID:  Ashlee Chavez is a 58 y.o. (DOB 1961-01-10) female.  Chief Complaint:  Chief Complaint  Patient presents with  . OCD    med changes  . Depression    Depression        Associated symptoms include appetite change and headaches.  Associated symptoms include no decreased concentration, no fatigue and no suicidal ideas.  [name] presents today for follow-up of recently worse depression DT relationship loss.    When seen February 10, 2019.  She had received some benefit from Ritalin 20 mg 3 times daily but was having issues with the crash.  We switched to Concerta 72 mg every morning.  Last visit  September 14, 2019.  She was having persistent bothersome OCD and was interested in further medicine changes to try to help with these symptoms because she has aggressively pursued therapy and still has significant symptoms.  She has been on multiple SSRIs and so we initiated augmentation with risperidone half milligram daily to increase to 1 mg daily.  Sig improvement with risperidone.  SE low appetite with it.  Not hungry for dinner. Sleep so much better also.  Improvement is "marked".  Think better bc less obsessing time.  Not constant now.  Noticed benefit within the first week.  Less obsessing lessened depression.    Xanax rarely.  Not so good since here.  Covid related some.  OCD seems to be winning and focused on the goal of a new med.  Still working.  Always there.  Breakup 2 y ago and still obsess on it.  Tired of obsessing about it.  Better overall with more even response to stimulant and better appetite.  No SE.  Duration all day.  Less desperate hopeless feelings on the stimulant.  Stressful times.  Not unusual levels of anxiety.  Lost her best friend 1 year ago and still grieving.  Not much family. Alone.  Not close to B and S who live elsewhere.  Talks to God daily.  Gets to work daily.  Function is good  there.  Reads and calls people.  OCD no worse with Covid.  [name] and describes anxiety as Moderate. Anxiety symptoms include: Excessive Worry, Obsessive Compulsive Symptoms:   Handwashing,,. Less anxiety with the OCD in the past.  Pt reports no sleep issues, it's better 7-8 hours. Pt reports that appetite is good. Pt reports that energy is good and down slightly. Concentration is no change. Suicidal thoughts:  denied by patient.  Numerous failed psychiatric medication trials include Lexapro 60 mg a day, Zoloft 250, Paxil with weight gain,  Clomipramine, Rexulti which helped initially, Vraylar, Abilify between 1 and 2.5 mg and up to 15 mg with no response, buspirone 30 mg,   Cytomel, Wellbutrin 300 with side effects, lithium 625, pramipexole with insomnia,   Lunesta with crying spells, topiramate, Deplin, propranolol,  Ritalin. No history of Prozac, Viibryd.  Review of Systems:  Review of Systems  Constitutional: Positive for appetite change. Negative for diaphoresis and fatigue.  Gastrointestinal: Negative for abdominal pain.  Neurological: Positive for headaches. Negative for dizziness, tremors and weakness.  Psychiatric/Behavioral: Positive for depression. Negative for agitation, behavioral problems, confusion, decreased concentration, hallucinations, self-injury, sleep disturbance and suicidal ideas. The patient is not hyperactive.     Medications: I have reviewed the patient's current medications.  Current Outpatient Medications  Medication Sig Dispense Refill  .  Acetylcysteine (NAC) 600 MG CAPS Take 1,200 mg by mouth 2 (two) times daily.    Marland Kitchen ALPRAZolam (XANAX) 0.25 MG tablet Take 0.25 mg by mouth daily as needed for anxiety.     Marland Kitchen atorvastatin (LIPITOR) 20 MG tablet TAKE ONE TABLET BY MOUTH ONCE DAILY 90 tablet 1  . Calcium Carbonate-Vitamin D (OSCAL 500/200 D-3 PO) Take by mouth.    . Cholecalciferol (VITAMIN D PO) Take by mouth.    .  levothyroxine (SYNTHROID) 112 MCG tablet TAKE 1 TABLET (112 MCG TOTAL) BY MOUTH DAILY BEFORE BREAKFAST. 90 tablet 1  . methylphenidate (CONCERTA) 36 MG PO CR tablet Take 2 tablets (72 mg total) by mouth daily. 60 tablet 0  . [START ON 11/09/2019] methylphenidate (CONCERTA) 36 MG PO CR tablet Take 2 tablets (72 mg total) by mouth daily. 60 tablet 0  . [START ON 12/09/2019] methylphenidate (CONCERTA) 36 MG PO CR tablet Take 2 tablets (72 mg total) by mouth daily. 60 tablet 0  . mirtazapine (REMERON) 30 MG tablet TAKE 2 TABLETS BY MOUTH AT BEDTIME 180 tablet 1  . PARoxetine (PAXIL) 40 MG tablet TAKE 1 TABLET BY MOUTH TWICE A DAY 180 tablet 1  . risedronate (ACTONEL) 150 MG tablet TAKE 1 TABLET EVERY 30 DAYS WITH WATER ON EMPTY STOMACH, NOTHING BY MOUTH OR LIE DOWN FOR 3 tablet 1  . risperiDONE (RISPERDAL) 1 MG tablet TAKE 1 TABLET (1 MG TOTAL) BY MOUTH AT BEDTIME. 90 tablet 0  . SUMAtriptan (IMITREX) 100 MG tablet Take 100 mg by mouth daily as needed for migraine.    . topiramate (TOPAMAX) 100 MG tablet TAKE 1 TABLET BY MOUTH TWICE A DAY 180 tablet 1   No current facility-administered medications for this visit.     Medication Side Effects: None  Allergies:  Allergies  Allergen Reactions  . Ampicillin Rash    Past Medical History:  Diagnosis Date  . Gum lesion    Biopsy done-benign  . Hypercholesteremia   . Hypothyroid   . LGSIL (low grade squamous intraepithelial dysplasia) 2010  . Migraines   . OCD (obsessive compulsive disorder)   . Osteoporosis 01/2016   T score -2.7    Family History  Problem Relation Age of Onset  . Hypertension Mother   . Heart disease Mother   . Hypertension Father   . Breast cancer Paternal Aunt        Age 1  . Diabetes Neg Hx     Social History   Socioeconomic History  . Marital status: Single    Spouse name: Not on file  . Number of children: Not on file  . Years of education: Not on file  . Highest education level: Not on file   Occupational History  . Not on file  Social Needs  . Financial resource strain: Not on file  . Food insecurity    Worry: Not on file    Inability: Not on file  . Transportation needs    Medical: Not on file    Non-medical: Not on file  Tobacco Use  . Smoking status: Never Smoker  . Smokeless tobacco: Never Used  Substance and Sexual Activity  . Alcohol use: Yes    Alcohol/week: 0.0 standard drinks    Comment: wine occassionally  . Drug use: No  . Sexual activity: Never    Birth control/protection: Post-menopausal    Comment: 1st intercourse 61 yo-5 partners  Lifestyle  . Physical activity    Days per week: Not on  file    Minutes per session: Not on file  . Stress: Not on file  Relationships  . Social Musicianconnections    Talks on phone: Not on file    Gets together: Not on file    Attends religious service: Not on file    Active member of club or organization: Not on file    Attends meetings of clubs or organizations: Not on file    Relationship status: Not on file  . Intimate partner violence    Fear of current or ex partner: Not on file    Emotionally abused: Not on file    Physically abused: Not on file    Forced sexual activity: Not on file  Other Topics Concern  . Not on file  Social History Narrative  . Not on file    Past Medical History, Surgical history, Social history, and Family history were reviewed and updated as appropriate.   Please see review of systems for further details on the patient's review from today.   Objective:   Physical Exam:  LMP 11/19/2010   Physical Exam Constitutional:      General: She is not in acute distress.    Appearance: Normal appearance. She is well-developed.  Musculoskeletal:        General: No deformity.  Neurological:     Mental Status: She is alert and oriented to person, place, and time.     Coordination: Coordination normal.  Psychiatric:        Attention and Perception: She is attentive. She does not perceive  auditory hallucinations.        Mood and Affect: Mood is anxious and depressed. Affect is not labile, blunt, angry or inappropriate.        Speech: Speech normal.        Behavior: Behavior normal.        Thought Content: Thought content normal. Thought content does not include homicidal or suicidal ideation. Thought content does not include homicidal or suicidal plan.        Cognition and Memory: Cognition normal.        Judgment: Judgment normal.     Comments: Insight intact. No auditory or visual hallucinations. No delusions.  Chronic residual obsession and compulsions are significantly improved since the last appointment in intensity and frequency.  This is resulted in an improvement in depression as well.  Though neither have resolved.     Lab Review:     Component Value Date/Time   NA 141 04/13/2019 0849   K 4.1 04/13/2019 0849   CL 108 04/13/2019 0849   CO2 27 04/13/2019 0849   GLUCOSE 88 04/13/2019 0849   BUN 17 04/13/2019 0849   CREATININE 0.65 04/13/2019 0849   CALCIUM 9.2 04/13/2019 0849   PROT 6.7 04/13/2019 0849   ALBUMIN 4.1 04/13/2019 0849   AST 21 04/13/2019 0849   ALT 16 04/13/2019 0849   ALKPHOS 40 04/13/2019 0849   BILITOT 0.2 04/13/2019 0849       Component Value Date/Time   WBC 5.9 01/29/2016 0834   RBC 4.85 01/29/2016 0834   HGB 15.2 (H) 01/29/2016 0834   HCT 46.2 (H) 01/29/2016 0834   PLT 232.0 01/29/2016 0834   MCV 95.3 01/29/2016 0834   MCHC 33.0 01/29/2016 0834   RDW 14.7 01/29/2016 0834   LYMPHSABS 1.3 01/29/2016 0834   MONOABS 0.4 01/29/2016 0834   EOSABS 0.3 01/29/2016 0834   BASOSABS 0.0 01/29/2016 0834    No results found for: POCLITH,  LITHIUM   No results found for: PHENYTOIN, PHENOBARB, VALPROATE, CBMZ   .res Assessment: Plan:    Mykenna was seen today for ocd and depression.  Diagnoses and all orders for this visit:  Mixed obsessional thoughts and acts  Social anxiety disorder  Major depressive disorder, recurrent episode,  moderate (HCC)  Attention deficit hyperactivity disorder (ADHD), predominantly inattentive type -     methylphenidate (CONCERTA) 36 MG PO CR tablet; Take 2 tablets (72 mg total) by mouth daily.  Insomnia due to psychological stress    Please see After Visit Summary for patient specific instructions.  Greater than 50% of 30 min face to face time with patient was spent on counseling and coordination of care. We discussed TRD and anxiety but has partial response with the higher dosage of mirtazapine 60mg  HS. she has a long list of failed psychiatric meds.  She is not tended to do well with higher dosages of SSRIs.  Tolerating mirtazapine and it does help her depression. . . She is more motivated to try alternatives than she has been in the past because of persistent bothersome OCD.  We discussed the off label use of antipsychotics for OCD.  She is tried some of them but has not tried the most commonly studied 1 which is risperidone.  She is up to 1 mg risperidone from last appointment and has noticed "marked" improvement with reduction in time obsessing which is therefore reduced depression.  She is tolerating it without side effects except an unexpected reduction in appetite.  She would like to try increasing the dose to see if she could get a little additional benefit if possible.  We discussed the side effects and probably the limiting side effect will be tiredness or sleepiness onset somewhere between 2 and 3 mg daily.  She will experiment by increasing risperidone from 1 mg by half of a milligram every couple of weeks and will call back and inform of the optimal dose...  Benefit so continue Concerta 36 2 each AM.  Off label option of selegiline and MAOI restrictions disc in detail.  This is a fairly risky medicine change.  TMS also now approved for OCD.[place] We will continue her other medications for anxiety.  She is aware that she is on a high dose but she is tolerating them and gained additional  benefit from the higher dosages.  She is aware of the risk of serotonin syndrome. Continue high dosage paroxetine 80 mg daily. Tolerated.  She is continuing psychotherapy with Dr. Marland Kitchen  Consider off label lamotrigine, Latuda, risperidone which has the best literature support.  Discussed potential metabolic side effects associated with atypical antipsychotics, as well as potential risk for movement side effects. Advised pt to contact office if movement side effects occur.  She agrees with the plan  Follow-up 2 to 3 months  Farrel Demark MD, DFAPA  Future Appointments  Date Time Provider Department Center  10/26/2019  8:00 AM [facility] None  10/29/2019  8:00 AM 10/31/2019, MD [facility] None  11/24/2019  5:00 PM 11/26/2019, PhD CP[facility]CP None  12/15/2019  4:00 PM Cottle, 02/12/2020., MD [facility]CP None  02/04/2020  3:30 PM Fontaine, 02/06/2020, MD [facility]    No orders of the defined types were placed in this encounter.     -------------------------------

## 2019-10-26 ENCOUNTER — Other Ambulatory Visit (INDEPENDENT_AMBULATORY_CARE_PROVIDER_SITE_OTHER): Payer: BC Managed Care – PPO

## 2019-10-26 ENCOUNTER — Other Ambulatory Visit: Payer: Self-pay

## 2019-10-26 DIAGNOSIS — E7800 Pure hypercholesterolemia, unspecified: Secondary | ICD-10-CM | POA: Diagnosis not present

## 2019-10-26 DIAGNOSIS — R7302 Impaired glucose tolerance (oral): Secondary | ICD-10-CM

## 2019-10-26 DIAGNOSIS — E063 Autoimmune thyroiditis: Secondary | ICD-10-CM

## 2019-10-26 LAB — LIPID PANEL
Cholesterol: 184 mg/dL (ref 0–200)
HDL: 74.7 mg/dL (ref >39.00)
LDL Cholesterol: 93 mg/dL (ref 0–99)
NonHDL: 109.27
Total CHOL/HDL Ratio: 2
Triglycerides: 79 mg/dL (ref 0.0–149.0)
VLDL: 15.8 mg/dL (ref 0.0–40.0)

## 2019-10-26 LAB — HEMOGLOBIN A1C: Hgb A1c MFr Bld: 6.1 % (ref 4.6–6.5)

## 2019-10-26 LAB — BASIC METABOLIC PANEL
BUN: 20 mg/dL (ref 6–23)
CO2: 25 mEq/L (ref 19–32)
Calcium: 10.5 mg/dL (ref 8.4–10.5)
Chloride: 107 mEq/L (ref 96–112)
Creatinine, Ser: 0.74 mg/dL (ref 0.40–1.20)
GFR: 80.05 mL/min (ref >60.00)
Glucose, Bld: 102 mg/dL — ABNORMAL HIGH (ref 70–99)
Potassium: 4.2 mEq/L (ref 3.5–5.1)
Sodium: 140 mEq/L (ref 135–145)

## 2019-10-26 LAB — TSH: TSH: 1.26 u[IU]/mL (ref 0.35–4.50)

## 2019-10-26 LAB — T4, FREE: Free T4: 0.82 ng/dL (ref 0.60–1.60)

## 2019-10-28 NOTE — Progress Notes (Signed)
Patient ID: Ashlee Chavez, female   DOB: 08/14/1961, 58 y.o.   MRN: [id number]           Chief complaint: Endocrinology follow-up  History of Present Illness:    OSTEOPOROSIS:   She had a bone density done in 2017 and this showed the following T-scores: Femoral neck: -2.7, previously -1.8 Spine: -1.9  By comparison she had a decline of 8-10 % in her bone density from 2 years before and 15% result from baseline She initially had a half inch height loss from baseline  She could not afford Evista and did not want to consider Reclast because of the possible high one time cost  In 7/17 she was started on treatment with risedronate 150 mg She has had no adverse effects and is taking this once a month on empty stomach as before consistently  She is taking calcium supplements with Os-Cal. Also regular with taking 5000 units of vitamin D with adequate vitamin D levels  She is going to get follow-up bone density by gynecologist, this has been postponed this year  LABS:  Lab Results  Component Value Date   VD25OH 48.34 04/13/2019   VD25OH 32.27 11/18/2017    HYPOTHYROIDISM:  She had a goiter diagnosed in 1996. She had a positive peroxidase antibody and has been on thyroxine since 1996  Her TSH was high in 2006 and she had symptoms of fatigue, cold intolerance and some hair loss  Since then has been on 75 mcg of Synthroid with occasional adjustments in the dose  Since 01/2014 she had been taking the same dose of levothyroxine, 75 g daily  Her TSH was higher than usual in 11/16 and she was having complaints about depression, insomnia, fatigue and more difficulties with her anxiety  Has had chronic cold sensitivity   Her dose was increased in 7/17 and in 1/19 it was further increased to to 137 mcg because of a high TSH She was not having any unusual fatigue but her TSH was gradually increasing She was initially having some palpitations and shakiness but no symptoms on the visit  when she was on 125 mcg  Recent history:  Her levothyroxine dose was reduced progressively down to 112 mcg in 03/2018  She feels fairly good recently with good energy level Her weight is about the same She does have a history of a small goiter  He has been consistent with taking the levothyroxine tablet in the morning before breakfast.  She will take her multivitamin and calcium separately  She prefers to take generic levothyroxine  TSH is normal again  Wt Readings from Last 3 Encounters:  10/29/19 116 lb (52.6 kg)  02/03/19 117 lb (53.1 kg)  10/14/18 118 lb (53.5 kg)    Lab Results  Component Value Date   TSH 1.26 10/26/2019   TSH 1.59 04/13/2019   TSH 2.44 10/12/2018   FREET4 0.82 10/26/2019   FREET4 0.70 04/13/2019   FREET4 0.57 (L) 10/12/2018    OTHER active problems discussed and review of systems   Past Medical History:  Diagnosis Date  . Gum lesion    Biopsy done-benign  . Hypercholesteremia   . Hypothyroid   . LGSIL (low grade squamous intraepithelial dysplasia) 2010  . Migraines   . OCD (obsessive compulsive disorder)   . Osteoporosis 01/2016   T score -2.7    Past Surgical History:  Procedure Laterality Date  . BREAST LUMPECTOMY  2001   right-benign  . CATARACT EXTRACTION  2005  .  Gum Biopsy     Benign-2019  `  Family History  Problem Relation Age of Onset  . Hypertension Mother   . Heart disease Mother   . Hypertension Father   . Breast cancer Paternal Aunt        Age 51  . Diabetes Neg Hx     Social History:  reports that she has never smoked. She has never used smokeless tobacco. She reports current alcohol use. She reports that she does not use drugs.  Allergies:  Allergies  Allergen Reactions  . Ampicillin Rash    Allergies as of 10/29/2019      Reactions   Ampicillin Rash      Medication List       Accurate as of October 29, 2019  8:04 AM. If you have any questions, ask your nurse or doctor.        ALPRAZolam 0.25  MG tablet Commonly known as: XANAX Take 0.25 mg by mouth daily as needed for anxiety.   atorvastatin 20 MG tablet Commonly known as: LIPITOR TAKE ONE TABLET BY MOUTH ONCE DAILY   levothyroxine 112 MCG tablet Commonly known as: SYNTHROID TAKE 1 TABLET (112 MCG TOTAL) BY MOUTH DAILY BEFORE BREAKFAST.   methylphenidate 36 MG CR tablet Commonly known as: Concerta Take 2 tablets (72 mg total) by mouth daily.   methylphenidate 36 MG CR tablet Commonly known as: Concerta Take 2 tablets (72 mg total) by mouth daily. Start taking on: November 09, 2019   methylphenidate 36 MG CR tablet Commonly known as: Concerta Take 2 tablets (72 mg total) by mouth daily. Start taking on: December 09, 2019   mirtazapine 30 MG tablet Commonly known as: REMERON TAKE 2 TABLETS BY MOUTH AT BEDTIME   NAC 600 MG Caps Generic drug: Acetylcysteine Take 1,200 mg by mouth 2 (two) times daily.   OSCAL 500/200 D-3 PO Take by mouth.   PARoxetine 40 MG tablet Commonly known as: PAXIL TAKE 1 TABLET BY MOUTH TWICE A DAY   risedronate 150 MG tablet Commonly known as: ACTONEL TAKE 1 TABLET EVERY 30 DAYS WITH WATER ON EMPTY STOMACH, NOTHING BY MOUTH OR LIE DOWN FOR 30MIN   risperiDONE 1 MG tablet Commonly known as: RISPERDAL TAKE 1 TABLET (1 MG TOTAL) BY MOUTH AT BEDTIME.   SUMAtriptan 100 MG tablet Commonly known as: IMITREX Take 100 mg by mouth daily as needed for migraine.   topiramate 100 MG tablet Commonly known as: TOPAMAX TAKE 1 TABLET BY MOUTH TWICE A DAY   VITAMIN D PO Take by mouth.        Review of Systems   HYPERCHOLESTEROLEMIA: She has been on Lipitor 20 mg for several years  LDL improved and now below 100 She has no other risk factors besides hypercholesterolemia  Still has a low-fat diet  Lab Results  Component Value Date   CHOL 184 10/26/2019   HDL 74.70 10/26/2019   LDLCALC 93 10/26/2019   TRIG 79.0 10/26/2019   CHOLHDL 2 10/26/2019    GLUCOSE: Her fasting  glucose has occasionally been high and now 102, otherwise has been fairly consistently normal  Although A1c is in prediabetic range again it is slightly lower than in 12/19 Recently she has started walking for exercise as she is feeling better overall No family history of diabetes  Lab Results  Component Value Date   HGBA1C 6.1 10/26/2019   HGBA1C 6.1 04/13/2019   HGBA1C 6.3 10/12/2018   Lab Results  Component Value Date  LDLCALC 93 10/26/2019   CREATININE 0.74 10/26/2019      PHYSICAL EXAM:  BP 104/78 (BP Location: Right Arm, Patient Position: Sitting, Cuff Size: Normal)   Pulse (!) 114   Temp 98.5 F (36.9 C)   Ht 5\' 3"  (1.6 m)   Wt 116 lb (52.6 kg)   LMP 11/19/2010   SpO2 98%   BMI 20.55 kg/m   Right thyroid lobe 1-1/2 times normal, slightly firm, left lobe not palpable No lymphadenopathy No peripheral edema  ASSESSMENT:    HYPOTHYROIDISM:   She has longstanding primary hypothyroidism with a small goiter  Subjectively is doing well with 112 mcg levothyroxine Recent weight about the same  She is taking her generic levothyroxine consistently before breakfast TSH is normal and consistent in the normal range  Prediabetic A1c: Fasting glucose is 102 However not clear if this could be partly related to Risperdal which she is not taking To minimize any effects of risperidone on insulin resistance she hopefully can stay on a small dose  She understand importance of a good diet and regular exercise  Osteoporosis: She will have a follow-up bone density with the gynecologist, Continue Actonel monthly as well as vitamin D  LIPIDS: Adequately controlled on Lipitor 20 mg and she has done this long-term without side effects   PLAN:    Continue levothyroxine 112 mcg once daily  Will review bone density report when available  Actonel will be continued for a total of 5 years before giving her a drug holiday  Consider glucose tolerance test if fasting blood  sugar continues to be high to consider possible use of Metformin   All questions related to her various problems, lab results were answered   01/18/2011 10/29/2019, 8:04 AM

## 2019-10-29 ENCOUNTER — Other Ambulatory Visit: Payer: Self-pay

## 2019-10-29 ENCOUNTER — Ambulatory Visit: Payer: BC Managed Care – PPO | Admitting: Endocrinology

## 2019-10-29 ENCOUNTER — Encounter: Payer: Self-pay | Admitting: Endocrinology

## 2019-10-29 VITALS — BP 104/78 | HR 114 | Temp 98.5°F | Ht 63.0 in | Wt 116.0 lb

## 2019-10-29 DIAGNOSIS — M810 Age-related osteoporosis without current pathological fracture: Secondary | ICD-10-CM | POA: Diagnosis not present

## 2019-10-29 DIAGNOSIS — E7800 Pure hypercholesterolemia, unspecified: Secondary | ICD-10-CM | POA: Diagnosis not present

## 2019-10-29 DIAGNOSIS — R7301 Impaired fasting glucose: Secondary | ICD-10-CM

## 2019-10-29 DIAGNOSIS — E559 Vitamin D deficiency, unspecified: Secondary | ICD-10-CM

## 2019-10-29 DIAGNOSIS — E063 Autoimmune thyroiditis: Secondary | ICD-10-CM | POA: Diagnosis not present

## 2019-11-24 ENCOUNTER — Ambulatory Visit (INDEPENDENT_AMBULATORY_CARE_PROVIDER_SITE_OTHER): Payer: BC Managed Care – PPO | Admitting: Psychiatry

## 2019-11-24 ENCOUNTER — Other Ambulatory Visit: Payer: Self-pay

## 2019-11-24 DIAGNOSIS — F422 Mixed obsessional thoughts and acts: Secondary | ICD-10-CM | POA: Diagnosis not present

## 2019-11-24 DIAGNOSIS — F4010 Social phobia, unspecified: Secondary | ICD-10-CM | POA: Diagnosis not present

## 2019-11-24 DIAGNOSIS — F331 Major depressive disorder, recurrent, moderate: Secondary | ICD-10-CM | POA: Diagnosis not present

## 2019-11-24 NOTE — Progress Notes (Signed)
Psychotherapy Progress Note [facility] Luan Moore, PhD LP  Patient ID: Ashlee Chavez     MRN: [id number]     Therapy format: Individual psychotherapy Date: 11/24/2019      Start: 5:17p     Stop: 6:06p     Time Spent: 49 min Location: In-person   Session narrative (presenting needs, interim history, self-report of stressors and symptoms, applications of prior therapy, status changes, and interventions made in session) Got vaccine Monday.  Went in [place], without researching and obsessing much.  Holidays were bad with no plans, nowhere to go, no [place], and 2nd time through Thanksgiving and Christmas since she died.  Brother cryptic about coming to visit, suspects sister-in-law is phobic about it and brother captive to her concerns, not personal or rejecting, but would like to know better.  Has benefited notably from the idea of treating dating like greeting cards, no more moral imperatives than that.    Good start with an intelligent, interesting academic man, then out of touch, came back after New Year's.  Had been occupied with family matters, she worked at holding compulsive doubts, and he got back in touch, actively texting again.    Getting leads on property from a realtor down [place], near [employer].  May go do some looking in the spring, to see if it feels like it's the right call to move there.  Some quandary over whether it's sensible, affordable.  Discussed outlook for making friends, new homeplace.  Optimistic that she could, and would enjoy it.  Risperidone still helpful getting normal sleep, now using 2 QHS, so will have to call in for early refill.  Methylphenidate ER formulation will not be covered, under contract, but it's probably something that requires demonstrating treatment failure with IR formula.  Taking IR formula for about a week now, and midday is problematic with dip in concentration and resurgent depressive feelings.  Will confer appropriately with  Dr. Clovis Pu about that.  Therapeutic modalities: Cognitive Behavioral Therapy and Solution-Oriented/Positive Psychology  Mental Status/Observations:  Appearance:   Casual     Behavior:  Appropriate  Motor:  Normal  Speech/Language:   Clear and Coherent  Affect:  Appropriate  Mood:  lighter, more at ease  Thought process:  normal  Thought content:    WNL  Sensory/Perceptual disturbances:    WNL  Orientation:  Fully oriented  Attention:  Good  Concentration:  Good  Memory:  WNL  Insight:    Good  Judgment:   Good  Impulse Control:  Good   Risk Assessment: Danger to Self: No Self-injurious Behavior: No Danger to Others: No Physical Aggression / Violence: No Duty to Warn: No Access to Firearms a concern: No  Assessment of progress:  progressing  Diagnosis:   ICD-10-CM   1. Mixed obsessional thoughts and acts  F42.2   2. Major depressive disorder, recurrent episode, moderate (HCC)  F33.1   3. Social anxiety disorder  F40.10    Plan:  . Continue dating as desired, maintain humane self-expectations . Monitor high or unrealistic expectations for contact and responsiveness by potential partners . [place] the [place] to working out financial math . Other recommendations/advice as noted above . Continue to utilize previously learned skills ad lib . Maintain medication as prescribed and work faithfully with relevant prescriber(s) if any changes are desired or seem indicated . Call the clinic on-call service, present to ER, or call  911 if any life-threatening psychiatric crisis Return in about 6 weeks (around 01/05/2020). Next scheduled in this office 12/15/2019  Robley Fries, PhD [name], PhD LP Clinical Psychologist, [facility] [address], [address] [place], [place] [address] [phone]

## 2019-12-02 ENCOUNTER — Other Ambulatory Visit: Payer: Self-pay | Admitting: Psychiatry

## 2019-12-03 ENCOUNTER — Other Ambulatory Visit: Payer: Self-pay

## 2019-12-03 ENCOUNTER — Telehealth: Payer: Self-pay | Admitting: Psychiatry

## 2019-12-03 DIAGNOSIS — F4010 Social phobia, unspecified: Secondary | ICD-10-CM

## 2019-12-03 DIAGNOSIS — F422 Mixed obsessional thoughts and acts: Secondary | ICD-10-CM

## 2019-12-03 MED ORDER — RISPERIDONE 2 MG PO TABS: 2.0000 mg | ORAL_TABLET | Freq: Every day | ORAL | 0 refills | Status: DC

## 2019-12-03 NOTE — Telephone Encounter (Signed)
Rx refill for Risperdal 2 mg 1 q hs #90 with no refills submitted to [facility] [address]

## 2019-12-03 NOTE — Telephone Encounter (Signed)
Pt needs a refill of Risperdal.  She is now taking 2.0mg  at night so this is dose change. Please send in new rx. [facility] on file.

## 2019-12-05 ENCOUNTER — Other Ambulatory Visit: Payer: Self-pay | Admitting: Endocrinology

## 2019-12-15 ENCOUNTER — Encounter: Payer: Self-pay | Admitting: Psychiatry

## 2019-12-15 ENCOUNTER — Ambulatory Visit (INDEPENDENT_AMBULATORY_CARE_PROVIDER_SITE_OTHER): Payer: BC Managed Care – PPO | Admitting: Psychiatry

## 2019-12-15 ENCOUNTER — Other Ambulatory Visit: Payer: Self-pay

## 2019-12-15 DIAGNOSIS — F422 Mixed obsessional thoughts and acts: Secondary | ICD-10-CM | POA: Diagnosis not present

## 2019-12-15 DIAGNOSIS — F5102 Adjustment insomnia: Secondary | ICD-10-CM

## 2019-12-15 DIAGNOSIS — F331 Major depressive disorder, recurrent, moderate: Secondary | ICD-10-CM | POA: Diagnosis not present

## 2019-12-15 DIAGNOSIS — F4010 Social phobia, unspecified: Secondary | ICD-10-CM

## 2019-12-15 DIAGNOSIS — F900 Attention-deficit hyperactivity disorder, predominantly inattentive type: Secondary | ICD-10-CM | POA: Diagnosis not present

## 2019-12-15 MED ORDER — DEXMETHYLPHENIDATE HCL ER 40 MG PO CP24: 1.0000 | ORAL_CAPSULE | ORAL | 0 refills | Status: DC

## 2019-12-15 MED ORDER — DEXMETHYLPHENIDATE HCL ER 20 MG PO CP24: 20.0000 mg | ORAL_CAPSULE | Freq: Every day | ORAL | 0 refills | Status: DC

## 2019-12-15 NOTE — Progress Notes (Signed)
Ashlee HashimotoJanet C Cura [id number] 02/07/1961 59 y.o.  Subjective:   Patient ID:  Ashlee Chavez is a 59 y.o. (DOB 07/29/1961) female.  Chief Complaint:  Chief Complaint  Patient presents with  . Follow-up    Medication management  . Depression    Medication management  . Anxiety  . ADD    Depression        Associated symptoms include appetite change and headaches.  Associated symptoms include no decreased concentration, no fatigue and no suicidal ideas.  Ashlee Chavez presents today for follow-up of recently worse depression DT relationship loss.    When seen February 10, 2019.  She had received some benefit from Ritalin 20 mg 3 times daily but was having issues with the crash.  We switched to Concerta 72 mg every morning.  September 14, 2019.  She was having persistent bothersome OCD and was interested in further medicine changes to try to help with these symptoms because she has aggressively pursued therapy and still has significant symptoms.  She has been on multiple SSRIs and so we initiated augmentation with risperidone half milligram daily to increase to 1 mg daily. Sig improvement with risperidone.  SE low appetite with it.  Not hungry for dinner. Sleep so much better also.  Improvement is "marked".  Think better bc less obsessing time.  Not constant now.  Noticed benefit within the first week.  Less obsessing lessened depression.  But residual symptoms.  Last visit October 14, 2019.  She was encouraged to experiment with the dosing of the risperidone up to 2 mg or 3 mg nightly to see if she could get additional reduction in her chronic anxiety and OCD.    Settled at 2 mg risperidone bc increased sleep.  Sleep is so much better.  Harder to get up in the morning.   Found out insurance won't cover Concerta anymore.  Not as good on Ritalin TID.  Not as even a response.  Terrible insurance.    Xanax rarely.  Not so good since here.  Covid related some.  OCD seems to be winning and  focused on the goal of a new med.  Still working.  Always there.  Breakup 2 y ago and still obsess on it.  Tired of obsessing about it.  Better overall with more even response to stimulant and better appetite.  No SE.  Duration all day.  Less desperate hopeless feelings on the stimulant.  Stressful times.  Not unusual levels of anxiety.  Lost her best friend 1 year ago and still grieving.  Not much family. Alone.  Not close to B and S who live elsewhere.  Talks to God daily.  Gets to work daily.  Function is good there.  Reads and calls people.  OCD no worse with Covid.  [name] and describes anxiety as Moderate. Anxiety symptoms include: Excessive Worry, Obsessive Compulsive Symptoms:   Handwashing,,. Less anxiety with the OCD in the past.  Pt reports no sleep issues, it's better 7-8 hours. Pt reports that appetite is good. Pt reports that energy is good and down slightly. Concentration is no change. Suicidal thoughts:  denied by patient.  Numerous failed psychiatric medication trials include Lexapro 60 mg a day, Zoloft 250, Paxil with weight gain,  Clomipramine, Rexulti which helped initially, Vraylar, Abilify between 1 and 2.5 mg and up to 15 mg with no response, buspirone 30 mg,   Cytomel, Wellbutrin 300 with side effects, lithium 625,  pramipexole with insomnia,   Lunesta with crying spells, topiramate, Deplin, propranolol,  Ritalin. No history of Prozac, Viibryd.  Review of Systems:  Review of Systems  Constitutional: Positive for appetite change. Negative for diaphoresis and fatigue.  Gastrointestinal: Negative for abdominal pain.  Neurological: Positive for headaches. Negative for dizziness, tremors and weakness.  Psychiatric/Behavioral: Positive for depression. Negative for agitation, behavioral problems, confusion, decreased concentration, hallucinations, self-injury, sleep disturbance and suicidal ideas. The patient is not hyperactive.      Medications: I have reviewed the patient's current medications.  Current Outpatient Medications  Medication Sig Dispense Refill  . Acetylcysteine (NAC) 600 MG CAPS Take 1,200 mg by mouth 2 (two) times daily.    Marland Kitchen ALPRAZolam (XANAX) 0.25 MG tablet Take 0.25 mg by mouth daily as needed for anxiety.     Marland Kitchen atorvastatin (LIPITOR) 20 MG tablet TAKE ONE TABLET BY MOUTH ONCE DAILY 90 tablet 1  . Calcium Carbonate-Vitamin D (OSCAL 500/200 D-3 PO) Take by mouth.    . Cholecalciferol (VITAMIN D PO) Take by mouth.    . levothyroxine (SYNTHROID) 112 MCG tablet TAKE 1 TABLET (112 MCG TOTAL) BY MOUTH DAILY BEFORE BREAKFAST. 90 tablet 1  . methylphenidate (CONCERTA) 36 MG PO CR tablet Take 2 tablets (72 mg total) by mouth daily. 60 tablet 0  . methylphenidate (CONCERTA) 36 MG PO CR tablet Take 2 tablets (72 mg total) by mouth daily. 60 tablet 0  . methylphenidate (CONCERTA) 36 MG PO CR tablet Take 2 tablets (72 mg total) by mouth daily. 60 tablet 0  . mirtazapine (REMERON) 30 MG tablet TAKE 2 TABLETS BY MOUTH AT BEDTIME 180 tablet 1  . PARoxetine (PAXIL) 40 MG tablet TAKE 1 TABLET BY MOUTH TWICE A DAY 180 tablet 1  . risedronate (ACTONEL) 150 MG tablet TAKE 1 TABLET EVERY 30 DAYS WITH WATER ON EMPTY STOMACH, NOTHING BY MOUTH OR LIE DOWN FOR 3 tablet 1  . risperiDONE (RISPERDAL) 2 MG tablet Take 1 tablet (2 mg total) by mouth at bedtime. 90 tablet 0  . SUMAtriptan (IMITREX) 100 MG tablet Take 100 mg by mouth daily as needed for migraine.    . topiramate (TOPAMAX) 100 MG tablet TAKE 1 TABLET BY MOUTH TWICE A DAY 180 tablet 1  . Dexmethylphenidate HCl 40 MG CP24 Take 1 capsule (40 mg total) by mouth every morning. 30 capsule 0   No current facility-administered medications for this visit.    Medication Side Effects: None  Allergies:  Allergies  Allergen Reactions  . Ampicillin Rash    Past Medical History:  Diagnosis Date  . Gum lesion    Biopsy done-benign  . Hypercholesteremia   .  Hypothyroid   . LGSIL (low grade squamous intraepithelial dysplasia) 2010  . Migraines   . OCD (obsessive compulsive disorder)   . Osteoporosis 01/2016   T score -2.7    Family History  Problem Relation Age of Onset  . Hypertension Mother   . Heart disease Mother   . Hypertension Father   . Breast cancer Paternal Aunt        Age 58  . Diabetes Neg Hx     Social History   Socioeconomic History  . Marital status: Single    Spouse name: Not on file  . Number of children: Not on file  . Years of education: Not on file  . Highest education level: Not on file  Occupational History  . Not on file  Tobacco Use  . Smoking status: Never  Smoker  . Smokeless tobacco: Never Used  Substance and Sexual Activity  . Alcohol use: Yes    Alcohol/week: 0.0 standard drinks    Comment: wine occassionally  . Drug use: No  . Sexual activity: Never    Birth control/protection: Post-menopausal    Comment: 1st intercourse 37 yo-5 partners  Other Topics Concern  . Not on file  Social History Narrative  . Not on file   Social Determinants of Health   Financial Resource Strain:   . Difficulty of Paying Living Expenses: Not on file  Food Insecurity:   . Worried About Programme researcher, broadcasting/film/video in the Last Year: Not on file  . Ran Out of Food in the Last Year: Not on file  Transportation Needs:   . Lack of Transportation (Medical): Not on file  . Lack of Transportation (Non-Medical): Not on file  Physical Activity:   . Days of Exercise per Week: Not on file  . Minutes of Exercise per Session: Not on file  Stress:   . Feeling of Stress : Not on file  Social Connections:   . Frequency of Communication with Friends and Family: Not on file  . Frequency of Social Gatherings with Friends and Family: Not on file  . Attends Religious Services: Not on file  . Active Member of Clubs or Organizations: Not on file  . Attends Banker Meetings: Not on file  . Marital Status: Not on file   Intimate Partner Violence:   . Fear of Current or Ex-Partner: Not on file  . Emotionally Abused: Not on file  . Physically Abused: Not on file  . Sexually Abused: Not on file    Past Medical History, Surgical history, Social history, and Family history were reviewed and updated as appropriate.   Please see review of systems for further details on the patient's review from today.   Objective:   Physical Exam:  LMP 11/19/2010   Physical Exam Constitutional:      General: She is not in acute distress.    Appearance: Normal appearance. She is well-developed.  Musculoskeletal:        General: No deformity.  Neurological:     Mental Status: She is alert and oriented to person, place, and time.     Coordination: Coordination normal.  Psychiatric:        Attention and Perception: She is attentive. She does not perceive auditory hallucinations.        Mood and Affect: Mood is anxious and depressed. Affect is not labile, blunt, angry or inappropriate.        Speech: Speech normal.        Behavior: Behavior normal.        Thought Content: Thought content normal. Thought content does not include homicidal or suicidal ideation. Thought content does not include homicidal or suicidal plan.        Cognition and Memory: Cognition normal.        Judgment: Judgment normal.     Comments: Insight intact. No auditory or visual hallucinations. No delusions.  Chronic residual obsession and compulsions are significantly improved since the last appointment in intensity and frequency.  This is resulted in an improvement in depression as well.  Though neither have resolved.     Lab Review:     Component Value Date/Time   NA 140 10/26/2019 0800   K 4.2 10/26/2019 0800   CL 107 10/26/2019 0800   CO2 25 10/26/2019 0800   GLUCOSE 102 (H)  10/26/2019 0800   BUN 20 10/26/2019 0800   CREATININE 0.74 10/26/2019 0800   CALCIUM 10.5 10/26/2019 0800   PROT 6.7 04/13/2019 0849   ALBUMIN 4.1 04/13/2019  0849   AST 21 04/13/2019 0849   ALT 16 04/13/2019 0849   ALKPHOS 40 04/13/2019 0849   BILITOT 0.2 04/13/2019 0849       Component Value Date/Time   WBC 5.9 01/29/2016 0834   RBC 4.85 01/29/2016 0834   HGB 15.2 (H) 01/29/2016 0834   HCT 46.2 (H) 01/29/2016 0834   PLT 232.0 01/29/2016 0834   MCV 95.3 01/29/2016 0834   MCHC 33.0 01/29/2016 0834   RDW 14.7 01/29/2016 0834   LYMPHSABS 1.3 01/29/2016 0834   MONOABS 0.4 01/29/2016 0834   EOSABS 0.3 01/29/2016 0834   BASOSABS 0.0 01/29/2016 0834    No results found for: POCLITH, LITHIUM   No results found for: PHENYTOIN, PHENOBARB, VALPROATE, CBMZ   .res Assessment: Plan:    Sophronia was seen today for follow-up, depression, anxiety and add.  Diagnoses and all orders for this visit:  Mixed obsessional thoughts and acts  Major depressive disorder, recurrent episode, moderate (HCC)  Social anxiety disorder  Attention deficit hyperactivity disorder (ADHD), predominantly inattentive type -     Discontinue: dexmethylphenidate (FOCALIN XR) 20 MG 24 hr capsule; Take 1 capsule (20 mg total) by mouth daily. -     Dexmethylphenidate HCl 40 MG CP24; Take 1 capsule (40 mg total) by mouth every morning.  Insomnia due to psychological stress   Greater than 50% of 30 min face to face time with patient was spent on counseling and coordination of care. We discussed TRD and anxiety but has partial response with the higher dosage of mirtazapine 60mg  HS. she has a long list of failed psychiatric meds.  She is not tended to do well with higher dosages of SSRIs.  Tolerating mirtazapine and it does help her depression.  She has chronic residual anxiety which is significant. . . We prescribed risperidone as an augmentation strategy for OCD and treatment resistant anxiety.  She has had significant improvement.  It is also improved her sleep.  She is happy with this benefit but still has significant residual symptoms.  She is continuing 2 mg daily but is  considering going higher..  Benefit  Concerta 36 2 each AM but no longer covered.  Try switch to Focalin XR 40 mg AM  To see if it's covered.  If necessary for cost reasons consider Ritalin SR.  Off label option of selegiline and MAOI restrictions disc in detail.  This is a fairly risky medicine change.  TMS also now approved for OCD. We will continue her other medications for anxiety.  She is aware that she is on a high dose but she is tolerating them and gained additional benefit from the higher dosages.  She is aware of the risk of serotonin syndrome. Continue high dosage paroxetine 80 mg daily. Tolerated.  She is continuing psychotherapy with Dr. Marland Kitchen  Consider off label lamotrigine, Latuda, risperidone which has the best literature support.  Discussed potential metabolic side effects associated with atypical antipsychotics, as well as potential risk for movement side effects. Advised pt to contact office if movement side effects occur.  She agrees with the plan  Follow-up 2 to 3 months  Farrel Demark MD, DFAPA  Future Appointments  Date Time Provider Department Center  01/18/2020  4:00 PM 03/19/2020, PhD [facility] None  02/04/2020  3:30 PM 02/06/2020,  MD [facility] Mariane Baumgarten  05/02/2020  8:00 AM [facility] None  05/04/2020  8:15 AM Elayne Snare, MD [facility] None    No orders of the defined types were placed in this encounter.     -------------------------------

## 2019-12-18 ENCOUNTER — Other Ambulatory Visit: Payer: Self-pay | Admitting: Endocrinology

## 2019-12-29 DIAGNOSIS — Z1231 Encounter for screening mammogram for malignant neoplasm of breast: Secondary | ICD-10-CM | POA: Diagnosis not present

## 2020-01-13 ENCOUNTER — Telehealth: Payer: Self-pay | Admitting: Psychiatry

## 2020-01-13 ENCOUNTER — Other Ambulatory Visit: Payer: Self-pay

## 2020-01-13 DIAGNOSIS — F900 Attention-deficit hyperactivity disorder, predominantly inattentive type: Secondary | ICD-10-CM

## 2020-01-13 MED ORDER — DEXMETHYLPHENIDATE HCL ER 40 MG PO CP24: ORAL_CAPSULE | ORAL | 0 refills | Status: DC

## 2020-01-13 MED ORDER — DEXMETHYLPHENIDATE HCL ER 40 MG PO CP24: 1.0000 | ORAL_CAPSULE | ORAL | 0 refills | Status: DC

## 2020-01-13 NOTE — Telephone Encounter (Signed)
Last refill 12/15/2019, 3 RX's pended for Dr. Jennelle Human to submit Last visit 02/03, due back 3 months

## 2020-01-13 NOTE — Telephone Encounter (Signed)
Pt needs refill on DEXMETHYLPHENIDATE 40 MG. Please send to [facility] on [address].

## 2020-01-14 NOTE — Telephone Encounter (Signed)
Called pt back LVM

## 2020-01-18 ENCOUNTER — Ambulatory Visit: Payer: BC Managed Care – PPO | Admitting: Psychiatry

## 2020-01-29 ENCOUNTER — Other Ambulatory Visit: Payer: Self-pay | Admitting: Psychiatry

## 2020-01-30 ENCOUNTER — Other Ambulatory Visit: Payer: Self-pay | Admitting: Psychiatry

## 2020-02-02 ENCOUNTER — Ambulatory Visit: Payer: BC Managed Care – PPO | Admitting: Psychiatry

## 2020-02-03 ENCOUNTER — Other Ambulatory Visit: Payer: Self-pay

## 2020-02-04 ENCOUNTER — Encounter: Payer: Self-pay | Admitting: Obstetrics and Gynecology

## 2020-02-04 ENCOUNTER — Ambulatory Visit: Payer: BC Managed Care – PPO | Admitting: Obstetrics and Gynecology

## 2020-02-04 VITALS — BP 114/74 | Ht 63.0 in | Wt 115.0 lb

## 2020-02-04 DIAGNOSIS — Z124 Encounter for screening for malignant neoplasm of cervix: Secondary | ICD-10-CM | POA: Diagnosis not present

## 2020-02-04 DIAGNOSIS — M810 Age-related osteoporosis without current pathological fracture: Secondary | ICD-10-CM

## 2020-02-04 DIAGNOSIS — Z01419 Encounter for gynecological examination (general) (routine) without abnormal findings: Secondary | ICD-10-CM | POA: Diagnosis not present

## 2020-02-04 NOTE — Progress Notes (Signed)
Ashlee Chavez [birth year] [id number]  SUBJECTIVE:  59 y.o. G0P0 female for annual routine gynecologic exam and Pap smear. She has no gynecologic concerns. Requests pediatric speculum for exam today.  Current Outpatient Medications  Medication Sig Dispense Refill  . Acetylcysteine (NAC) 600 MG CAPS Take 1,200 mg by mouth 2 (two) times daily.    Marland Kitchen ALPRAZolam (XANAX) 0.25 MG tablet Take 0.25 mg by mouth daily as needed for anxiety.     Marland Kitchen atorvastatin (LIPITOR) 20 MG tablet TAKE ONE TABLET BY MOUTH ONCE DAILY 90 tablet 1  . Calcium Carbonate-Vitamin D (OSCAL 500/200 D-3 PO) Take by mouth.    . Cholecalciferol (VITAMIN D PO) Take by mouth.    Derrill Memo ON 03/09/2020] Dexmethylphenidate HCl 40 MG CP24 Take 1 capsule (40 mg total) by mouth every morning. 30 capsule 0  . [START ON 02/10/2020] Dexmethylphenidate HCl 40 MG CP24 Take 1 capsule (40 mg) by mouth every morning 30 capsule 0  . Dexmethylphenidate HCl 40 MG CP24 Take 1 capsule (40 mg) by mouth every morning 30 capsule 0  . levothyroxine (SYNTHROID) 112 MCG tablet TAKE 1 TABLET (112 MCG TOTAL) BY MOUTH DAILY BEFORE BREAKFAST. 90 tablet 1  . mirtazapine (REMERON) 30 MG tablet TAKE 2 TABLETS BY MOUTH AT BEDTIME 180 tablet 1  . PARoxetine (PAXIL) 40 MG tablet TAKE 1 TABLET BY MOUTH TWICE A DAY 180 tablet 1  . risedronate (ACTONEL) 150 MG tablet TAKE 1 TABLET EVERY 30 DAYS WITH WATER ON EMPTY STOMACH, NOTHING BY MOUTH OR LIE DOWN FOR 30MIN 3 tablet 1  . risperiDONE (RISPERDAL) 2 MG tablet Take 1 tablet (2 mg total) by mouth at bedtime. 90 tablet 0  . SUMAtriptan (IMITREX) 100 MG tablet Take 100 mg by mouth daily as needed for migraine.    . topiramate (TOPAMAX) 100 MG tablet TAKE 1 TABLET BY MOUTH TWICE A DAY 180 tablet 1   No current facility-administered medications for this visit.   Allergies: Ampicillin  Patient's last menstrual period was 11/19/2010.  Past medical history,surgical history, problem list, medications, allergies, family  history and social history were all reviewed and documented as reviewed in the EPIC chart.  ROS:  Feeling well. No dyspnea or chest pain on exertion.  No abdominal pain, change in bowel habits, black or bloody stools.  No urinary tract symptoms. GYN ROS:  no abnormal bleeding, pelvic pain or discharge, no breast pain or new or enlarging lumps on self exam. No neurological complaints.  OBJECTIVE:  BP 114/74   Ht 5\' 3"  (1.6 m)   Wt 115 lb (52.2 kg)   LMP 11/19/2010   BMI 20.37 kg/m  The patient appears well, alert, oriented x 3, in no distress. ENT normal.  Neck supple. No cervical or supraclavicular adenopathy or thyromegaly.  Lungs are clear, good air entry, no wheezes, rhonchi or rales. S1 and S2 normal, no murmurs, regular rate and rhythm.  Abdomen soft without tenderness, guarding, mass or organomegaly.  Neurological is normal, no focal findings.  BREAST EXAM: breasts appear normal, no suspicious masses, no skin or nipple changes or axillary nodes  PELVIC EXAM: VULVA: normal appearing vulva with no masses, tenderness or lesions, atrophic changes, VAGINA: pediatric speculum used, normal appearing vagina with normal color and discharge, no lesions, CERVIX: retroverted orientation, normal appearing cervix without discharge or lesions, UTERUS: uterus is normal size, shape, consistency and nontender, ADNEXA: normal adnexa in size, nontender and no masses, RECTAL: normal rectal, no masses, PAP: Pap smear done today,  thin-prep method  Chaperone: Kennon Portela present during the examination  ASSESSMENT:  59 y.o. G0P0 here for annual gynecologic exam  PLAN:   1. Postmenopausal.  No hot flashes or night sweats.  No vaginal bleeding. 2. Pap smear 2018.  No significant history of abnormal Pap smears.  Pap smear collected today patient request given she is at the 3-year interval. 3. Mammogram 12/2019.  Normal breast exam today.  Continue annual mammography. 4. Colonoscopy 2012.  Recommended that  she follow up at the recommended interval.   5. Osteoporosis. DEXA 01/2016 T score -2.7.  Started on Actonel at that time by Dr. Lucianne Muss.  Next DEXA recommended this year so she plans to schedule this. 6. Health maintenance.  No labs today as she normally has these completed with her primary care provider.    Return annually or sooner, prn.  Theresia Majors MD, FACOG  02/04/20

## 2020-02-07 LAB — PAP IG W/ RFLX HPV ASCU

## 2020-02-08 ENCOUNTER — Ambulatory Visit (INDEPENDENT_AMBULATORY_CARE_PROVIDER_SITE_OTHER): Payer: BC Managed Care – PPO | Admitting: Psychiatry

## 2020-02-08 ENCOUNTER — Other Ambulatory Visit: Payer: Self-pay

## 2020-02-08 ENCOUNTER — Encounter: Payer: Self-pay | Admitting: Psychiatry

## 2020-02-08 DIAGNOSIS — F5102 Adjustment insomnia: Secondary | ICD-10-CM

## 2020-02-08 DIAGNOSIS — F331 Major depressive disorder, recurrent, moderate: Secondary | ICD-10-CM | POA: Diagnosis not present

## 2020-02-08 DIAGNOSIS — F4010 Social phobia, unspecified: Secondary | ICD-10-CM | POA: Diagnosis not present

## 2020-02-08 DIAGNOSIS — F900 Attention-deficit hyperactivity disorder, predominantly inattentive type: Secondary | ICD-10-CM | POA: Diagnosis not present

## 2020-02-08 DIAGNOSIS — F422 Mixed obsessional thoughts and acts: Secondary | ICD-10-CM | POA: Diagnosis not present

## 2020-02-08 MED ORDER — METHYLPHENIDATE HCL ER (OSM) 36 MG PO TBCR: 72.0000 mg | EXTENDED_RELEASE_TABLET | Freq: Every day | ORAL | 0 refills | Status: DC

## 2020-02-08 NOTE — Progress Notes (Signed)
Ashlee Chavez [id number] 28-Feb-1961 59 y.o.  Subjective:   Patient ID:  Ashlee Chavez is a 59 y.o. (DOB [birth year]) female.  Chief Complaint:  Chief Complaint  Patient presents with  . ADD  . Anxiety  . Depression  . Follow-up    Med change    Depression        Associated symptoms include appetite change and headaches.  Associated symptoms include no decreased concentration, no fatigue and no suicidal ideas.  Ashlee Chavez presents today for follow-up of recently worse depression DT relationship loss.    When seen February 10, 2019.  She had received some benefit from Ritalin 20 mg 3 times daily but was having issues with the crash.  We switched to Concerta 72 mg every morning.  September 14, 2019.  She was having persistent bothersome OCD and was interested in further medicine changes to try to help with these symptoms because she has aggressively pursued therapy and still has significant symptoms.  She has been on multiple SSRIs and so we initiated augmentation with risperidone half milligram daily to increase to 1 mg daily. Sig improvement with risperidone.  SE low appetite with it.  Not hungry for dinner. Sleep so much better also.  Improvement is "marked".  Think better bc less obsessing time.  Not constant now.  Noticed benefit within the first week.  Less obsessing lessened depression.  But residual symptoms.  visit October 14, 2019.  She was encouraged to experiment with the dosing of the risperidone up to 2 mg or 3 mg nightly to see if she could get additional reduction in her chronic anxiety and OCD.   Last seen December 15, 2019.  Settled at 2 mg risperidone bc increased sleep.  Sleep is so much better.  Harder to get up in the morning.   Found out insurance won't cover Concerta anymore.  Not as good on Ritalin TID.  Not as even a response.  Terrible insurance. Therefore the only med change with switching to Focalin to see if it would be covered better.  Tough adjusting  to Focalin with dizziness and HA.  Anxiety crying depression surprisingly bad with the change.  Not as smooth as Concerta.  Closer to tears than usual Risperidone helped OCD and wonders about increase.  Still anxieous and depressed.  Xanax rarely.  Not so good since here.  Covid related some.  OCD seems to be winning and focused on the goal of a new med.  Still working.  Always there.  Breakup 2 y ago and still obsess on it.  Tired of obsessing about it.  Better overall with more even response to stimulant and better appetite.  No SE.  Duration all day.  Less desperate hopeless feelings on the stimulant.  Stressful times.  Not unusual levels of anxiety.  Lost her best friend 1 year ago and still grieving.  Not much family. Alone.  Not close to B and S who live elsewhere.  Talks to God daily.  Gets to work daily.  Function is good there.  Reads and calls people.  OCD no worse with Covid.  [name] and describes anxiety as Moderate. Anxiety symptoms include: Excessive Worry, Obsessive Compulsive Symptoms:   Handwashing,,. Less anxiety with the OCD in the past.  Pt reports no sleep issues, it's better 7-8 hours. Pt reports that appetite is good. Pt reports that energy is good and down slightly. Concentration is no change. Suicidal thoughts:  denied by patient.  Numerous failed psychiatric medication trials include Lexapro 60 mg a day, Zoloft 250, Paxil with weight gain,  Clomipramine, Rexulti which helped initially, Vraylar, Abilify between 1 and 2.5 mg and up to 15 mg with no response, buspirone 30 mg,   Cytomel, Wellbutrin 300 with side effects, lithium 625, pramipexole with insomnia,   Lunesta with crying spells, topiramate, Deplin, propranolol,  Ritalin. No history of Prozac, Viibryd.  Review of Systems:  Review of Systems  Constitutional: Positive for appetite change. Negative for diaphoresis and fatigue.  Gastrointestinal: Negative for abdominal pain.   Neurological: Positive for headaches. Negative for dizziness, tremors, weakness and light-headedness.  Psychiatric/Behavioral: Positive for depression. Negative for agitation, behavioral problems, confusion, decreased concentration, hallucinations, self-injury, sleep disturbance and suicidal ideas. The patient is not hyperactive.     Medications: I have reviewed the patient's current medications.  Current Outpatient Medications  Medication Sig Dispense Refill  . Acetylcysteine (NAC) 600 MG CAPS Take 1,200 mg by mouth 2 (two) times daily.    Marland Kitchen. ALPRAZolam (XANAX) 0.25 MG tablet Take 0.25 mg by mouth daily as needed for anxiety.     Marland Kitchen. atorvastatin (LIPITOR) 20 MG tablet TAKE ONE TABLET BY MOUTH ONCE DAILY 90 tablet 1  . Calcium Carbonate-Vitamin D (OSCAL 500/200 D-3 PO) Take by mouth.    . Cholecalciferol (VITAMIN D PO) Take by mouth.    [name]. [START ON 03/09/2020] Dexmethylphenidate HCl 40 MG CP24 Take 1 capsule (40 mg total) by mouth every morning. 30 capsule 0  . [START ON 02/10/2020] Dexmethylphenidate HCl 40 MG CP24 Take 1 capsule (40 mg) by mouth every morning 30 capsule 0  . Dexmethylphenidate HCl 40 MG CP24 Take 1 capsule (40 mg) by mouth every morning 30 capsule 0  . levothyroxine (SYNTHROID) 112 MCG tablet TAKE 1 TABLET (112 MCG TOTAL) BY MOUTH DAILY BEFORE BREAKFAST. 90 tablet 1  . mirtazapine (REMERON) 30 MG tablet TAKE 2 TABLETS BY MOUTH AT BEDTIME 180 tablet 1  . PARoxetine (PAXIL) 40 MG tablet TAKE 1 TABLET BY MOUTH TWICE A DAY 180 tablet 1  . risedronate (ACTONEL) 150 MG tablet TAKE 1 TABLET EVERY 30 DAYS WITH WATER ON EMPTY STOMACH, NOTHING BY MOUTH OR LIE DOWN FOR 30MIN 3 tablet 1  . risperiDONE (RISPERDAL) 2 MG tablet Take 1 tablet (2 mg total) by mouth at bedtime. 90 tablet 0  . SUMAtriptan (IMITREX) 100 MG tablet Take 100 mg by mouth daily as needed for migraine.    . topiramate (TOPAMAX) 100 MG tablet TAKE 1 TABLET BY MOUTH TWICE A DAY 180 tablet 1  . methylphenidate (CONCERTA)  36 MG PO CR tablet Take 2 tablets (72 mg total) by mouth daily. 60 tablet 0   No current facility-administered medications for this visit.    Medication Side Effects: None  Allergies:  Allergies  Allergen Reactions  . Ampicillin Rash    Past Medical History:  Diagnosis Date  . Gum lesion    Biopsy done-benign  . Hypercholesteremia   . Hypothyroid   . LGSIL (low grade squamous intraepithelial dysplasia) 2010  . Migraines   . OCD (obsessive compulsive disorder)   . Osteoporosis 01/2016   T score -2.7    Family History  Problem Relation Age of Onset  . Hypertension Mother   . Heart disease Mother   . Hypertension Father   . Breast cancer Paternal Aunt        Age 61  . Diabetes Neg Hx     Social  History   Socioeconomic History  . Marital status: Single    Spouse name: Not on file  . Number of children: Not on file  . Years of education: Not on file  . Highest education level: Not on file  Occupational History  . Not on file  Tobacco Use  . Smoking status: Never Smoker  . Smokeless tobacco: Never Used  Substance and Sexual Activity  . Alcohol use: Yes    Alcohol/week: 0.0 standard drinks    Comment: wine occassionally  . Drug use: No  . Sexual activity: Never    Birth control/protection: Post-menopausal    Comment: 1st intercourse 39 yo-5 partners  Other Topics Concern  . Not on file  Social History Narrative  . Not on file   Social Determinants of Health   Financial Resource Strain:   . Difficulty of Paying Living Expenses:   Food Insecurity:   . Worried About Programme researcher, broadcasting/film/video in the Last Year:   . Barista in the Last Year:   Transportation Needs:   . Freight forwarder (Medical):   Marland Kitchen Lack of Transportation (Non-Medical):   Physical Activity:   . Days of Exercise per Week:   . Minutes of Exercise per Session:   Stress:   . Feeling of Stress :   Social Connections:   . Frequency of Communication with Friends and Family:   .  Frequency of Social Gatherings with Friends and Family:   . Attends Religious Services:   . Active Member of Clubs or Organizations:   . Attends Banker Meetings:   Marland Kitchen Marital Status:   Intimate Partner Violence:   . Fear of Current or Ex-Partner:   . Emotionally Abused:   Marland Kitchen Physically Abused:   . Sexually Abused:     Past Medical History, Surgical history, Social history, and Family history were reviewed and updated as appropriate.   Please see review of systems for further details on the patient's review from today.   Objective:   Physical Exam:  LMP 11/19/2010   Physical Exam Constitutional:      General: She is not in acute distress.    Appearance: Normal appearance. She is well-developed.  Musculoskeletal:        General: No deformity.  Neurological:     Mental Status: She is alert and oriented to person, place, and time.     Coordination: Coordination normal.  Psychiatric:        Attention and Perception: She is attentive. She does not perceive auditory hallucinations.        Mood and Affect: Mood is anxious and depressed. Affect is not labile, blunt, angry or inappropriate.        Speech: Speech normal.        Behavior: Behavior normal.        Thought Content: Thought content normal. Thought content does not include homicidal or suicidal ideation. Thought content does not include homicidal or suicidal plan.        Cognition and Memory: Cognition normal.        Judgment: Judgment normal.     Comments: Insight intact. No auditory or visual hallucinations. No delusions.  Depression anxiety and OCD are all worse than the last visit.     Lab Review:     Component Value Date/Time   NA 140 10/26/2019 0800   K 4.2 10/26/2019 0800   CL 107 10/26/2019 0800   CO2 25 10/26/2019 0800   GLUCOSE 102 (  H) 10/26/2019 0800   BUN 20 10/26/2019 0800   CREATININE 0.74 10/26/2019 0800   CALCIUM 10.5 10/26/2019 0800   PROT 6.7 04/13/2019 0849   ALBUMIN 4.1  04/13/2019 0849   AST 21 04/13/2019 0849   ALT 16 04/13/2019 0849   ALKPHOS 40 04/13/2019 0849   BILITOT 0.2 04/13/2019 0849       Component Value Date/Time   WBC 5.9 01/29/2016 0834   RBC 4.85 01/29/2016 0834   HGB 15.2 (H) 01/29/2016 0834   HCT 46.2 (H) 01/29/2016 0834   PLT 232.0 01/29/2016 0834   MCV 95.3 01/29/2016 0834   MCHC 33.0 01/29/2016 0834   RDW 14.7 01/29/2016 0834   LYMPHSABS 1.3 01/29/2016 0834   MONOABS 0.4 01/29/2016 0834   EOSABS 0.3 01/29/2016 0834   BASOSABS 0.0 01/29/2016 0834    No results found for: POCLITH, LITHIUM   No results found for: PHENYTOIN, PHENOBARB, VALPROATE, CBMZ   .res Assessment: Plan:    Alexyia was seen today for add, anxiety, depression and follow-up.  Diagnoses and all orders for this visit:  Mixed obsessional thoughts and acts  Attention deficit hyperactivity disorder (ADHD), predominantly inattentive type -     methylphenidate (CONCERTA) 36 MG PO CR tablet; Take 2 tablets (72 mg total) by mouth daily.  Major depressive disorder, recurrent episode, moderate (HCC)  Social anxiety disorder  Insomnia due to psychological stress   Greater than 50% of 30 min face to face time with patient was spent on counseling and coordination of care. We discussed TRD and anxiety but has partial response with the higher dosage of mirtazapine 60mg  HS. she has a long list of failed psychiatric meds.  She is not tended to do well with higher dosages of SSRIs.  Tolerating mirtazapine and it does help her depression.  She has chronic residual anxiety which is significant. . . We prescribed risperidone as an augmentation strategy for OCD and treatment resistant anxiety.  She has had significant improvement.  It is also improved her sleep.  She is happy with this benefit but still has significant residual symptoms.  She is continuing 2 mg daily but is considering going higher..  Benefit  Concerta 36 2 each AM but no longer covered.   Multiple SE to  Focalin XR 40 mg AM  .  Effectiveness OK.   Dose reduction vs switch to Ritalin SR but would need 60 mg daily.  Will retry getting Concerta 72 mg covered.   If necessary for cost reasons consider Ritalin SR.  Off label option of selegiline and MAOI restrictions disc in detail.  This is a fairly risky medicine change.  TMS also now approved for OCD. We will continue her other medications for anxiety.  She is aware that she is on a high dose but she is tolerating them and gained additional benefit from the higher dosages.  She is aware of the risk of serotonin syndrome. Continue high dosage paroxetine 80 mg daily. Tolerated.  She is continuing psychotherapy with Dr. Marland Kitchen  Consider off label lamotrigine, Latuda, risperidone which has the best literature support. Increase risperidone to 3 mg daily.  Discussed potential metabolic side effects associated with atypical antipsychotics, as well as potential risk for movement side effects. Advised pt to contact office if movement side effects occur.  She agrees with the plan  Follow-up 1 months  Farrel Demark MD, DFAPA  Future Appointments  Date Time Provider Department Center  02/15/2020  3:20 PM [facility]  None  03/01/2020  8:00 AM Robley Fries, PhD [facility] None  05/02/2020  8:00 AM [facility] None  05/04/2020  8:15 AM Reather Littler, MD [facility] None  02/06/2021  3:30 PM Theresia Majors, MD [facility]    No orders of the defined types were placed in this encounter.     -------------------------------

## 2020-02-14 ENCOUNTER — Other Ambulatory Visit: Payer: Self-pay

## 2020-02-15 ENCOUNTER — Other Ambulatory Visit: Payer: Self-pay | Admitting: Obstetrics & Gynecology

## 2020-02-15 ENCOUNTER — Other Ambulatory Visit: Payer: Self-pay | Admitting: Endocrinology

## 2020-02-15 ENCOUNTER — Ambulatory Visit (INDEPENDENT_AMBULATORY_CARE_PROVIDER_SITE_OTHER): Payer: BC Managed Care – PPO

## 2020-02-15 DIAGNOSIS — M810 Age-related osteoporosis without current pathological fracture: Secondary | ICD-10-CM

## 2020-02-15 DIAGNOSIS — Z780 Asymptomatic menopausal state: Secondary | ICD-10-CM | POA: Diagnosis not present

## 2020-02-25 DIAGNOSIS — G43109 Migraine with aura, not intractable, without status migrainosus: Secondary | ICD-10-CM | POA: Diagnosis not present

## 2020-02-25 DIAGNOSIS — M810 Age-related osteoporosis without current pathological fracture: Secondary | ICD-10-CM | POA: Diagnosis not present

## 2020-02-25 DIAGNOSIS — E039 Hypothyroidism, unspecified: Secondary | ICD-10-CM | POA: Diagnosis not present

## 2020-02-25 DIAGNOSIS — E7800 Pure hypercholesterolemia, unspecified: Secondary | ICD-10-CM | POA: Diagnosis not present

## 2020-02-26 ENCOUNTER — Other Ambulatory Visit: Payer: Self-pay | Admitting: Psychiatry

## 2020-02-26 DIAGNOSIS — F4010 Social phobia, unspecified: Secondary | ICD-10-CM

## 2020-02-26 DIAGNOSIS — F422 Mixed obsessional thoughts and acts: Secondary | ICD-10-CM

## 2020-03-01 ENCOUNTER — Other Ambulatory Visit: Payer: Self-pay

## 2020-03-01 ENCOUNTER — Ambulatory Visit (INDEPENDENT_AMBULATORY_CARE_PROVIDER_SITE_OTHER): Payer: BC Managed Care – PPO | Admitting: Psychiatry

## 2020-03-01 DIAGNOSIS — F4010 Social phobia, unspecified: Secondary | ICD-10-CM

## 2020-03-01 DIAGNOSIS — F5102 Adjustment insomnia: Secondary | ICD-10-CM | POA: Diagnosis not present

## 2020-03-01 DIAGNOSIS — F422 Mixed obsessional thoughts and acts: Secondary | ICD-10-CM

## 2020-03-01 DIAGNOSIS — F331 Major depressive disorder, recurrent, moderate: Secondary | ICD-10-CM | POA: Diagnosis not present

## 2020-03-01 NOTE — Progress Notes (Signed)
Psychotherapy Progress Note [facility] [name], PhD LP  Patient ID: Ashlee Chavez     MRN: [id number] Therapy format: Individual psychotherapy Date: 03/01/2020      Start: 8:14a     Stop: 9:03a     Time Spent: 49 min Location: In-person   Session narrative (presenting needs, interim history, self-report of stressors and symptoms, applications of prior therapy, status changes, and interventions made in session) Stressful time with stimulant changes driven by insurance coverage changes.  At worst, slept a lot and considered disability.  Current insurance package is expensive and encountering a number of holes in coverage.  Has entailed a number more frequent visits with Dr. Jennelle Human.  Has had virtually no one to confide in with parents gone, sister inadequate, [name] deceased, and pandemic plus social anxiety interfering with getting to know other.  Did join a Bible study with funny, smart, older women, engaging but not really her peers.  Looked on Meetup, only finding intimidating extraverts.  Feeling out for another church-based program through a coworker that consists of 6- and 12-wk studies.  Able to claim a string of successes going to a 12-wk study.  Working up to go to outdoor mass at [employer].    Apprehensive about an offer from friends in [place] to come down and visit, feels pressure to entertain and live up to expectations.  Reframed as opportunity to branch out and reduce the grip of worry.  Can be proud of herself for working 41 years with OCD.  On the down side, work has become distinctly un-challenging, which allows OCD/worry to play more and feels less fulfilling.  Encouraged seek purposes, ask challenge if needed.  Therapeutic modalities: Cognitive Behavioral Therapy and Solution-Oriented/Positive Psychology  Mental Status/Observations:  Appearance:   Casual     Behavior:  Appropriate  Motor:  Normal  Speech/Language:   Clear and Coherent  Affect:   Appropriate  Mood:  dysthymic  Thought process:  normal  Thought content:    WNL, worries  Sensory/Perceptual disturbances:    WNL  Orientation:  Fully oriented  Attention:  Good    Concentration:  Good  Memory:  WNL  Insight:    Good  Judgment:   Good  Impulse Control:  Good   Risk Assessment: Danger to Self: No Self-injurious Behavior: No Danger to Others: No Physical Aggression / Violence: No Duty to Warn: No Access to Firearms a concern: No  Assessment of progress:  progressing  Diagnosis:   ICD-10-CM   1. Mixed obsessional thoughts and acts  F42.2   2. Social anxiety disorder  F40.10   3. Major depressive disorder, recurrent episode, moderate (HCC)  F33.1   4. Insomnia due to psychological stress  F51.02    Plan:  . Continue seeking socially stimulating opportunities . Option approach boss about changes in duties to freshen up motivation for work . Other recommendations/advice as may be noted above . Continue to utilize previously learned skills ad lib . Maintain medication as prescribed and work faithfully with relevant prescriber(s) if any changes are desired or seem indicated . Call the clinic on-call service, present to ER, or call 911 if any life-threatening psychiatric crisis Return in about 6 weeks (around 04/12/2020). . Already scheduled visit in this office 03/09/2020.  Robley Fries, PhD [name], PhD LP Clinical Psychologist, [facility] [address] [place], [place] [address] [phone]

## 2020-03-08 ENCOUNTER — Ambulatory Visit: Payer: BC Managed Care – PPO | Admitting: Psychiatry

## 2020-03-09 ENCOUNTER — Other Ambulatory Visit: Payer: Self-pay

## 2020-03-09 ENCOUNTER — Encounter: Payer: Self-pay | Admitting: Psychiatry

## 2020-03-09 ENCOUNTER — Ambulatory Visit (INDEPENDENT_AMBULATORY_CARE_PROVIDER_SITE_OTHER): Payer: BC Managed Care – PPO | Admitting: Psychiatry

## 2020-03-09 DIAGNOSIS — F422 Mixed obsessional thoughts and acts: Secondary | ICD-10-CM | POA: Diagnosis not present

## 2020-03-09 DIAGNOSIS — F331 Major depressive disorder, recurrent, moderate: Secondary | ICD-10-CM | POA: Diagnosis not present

## 2020-03-09 DIAGNOSIS — F900 Attention-deficit hyperactivity disorder, predominantly inattentive type: Secondary | ICD-10-CM

## 2020-03-09 DIAGNOSIS — F4010 Social phobia, unspecified: Secondary | ICD-10-CM | POA: Diagnosis not present

## 2020-03-09 DIAGNOSIS — F5102 Adjustment insomnia: Secondary | ICD-10-CM

## 2020-03-09 MED ORDER — METHYLPHENIDATE HCL ER (OSM) 36 MG PO TBCR: 72.0000 mg | EXTENDED_RELEASE_TABLET | Freq: Every day | ORAL | 0 refills | Status: DC

## 2020-03-09 MED ORDER — LAMOTRIGINE 25 MG PO TABS: ORAL_TABLET | ORAL | 1 refills | Status: DC

## 2020-03-09 NOTE — Progress Notes (Signed)
ELONDA GIULIANO [id number] [birth year] 59 y.o.  Subjective:   Patient ID:  Ashlee Chavez is a 59 y.o. (DOB [birth year]) female.  Chief Complaint:  Chief Complaint  Patient presents with  . Follow-up    med changes  . ADD  . Anxiety  . Depression    Depression        Associated symptoms include appetite change and headaches.  Associated symptoms include no decreased concentration, no fatigue and no suicidal ideas.  [name] presents today for follow-up of recently worse depression DT relationship loss.    When seen February 10, 2019.  She had received some benefit from Ritalin 20 mg 3 times daily but was having issues with the crash.  We switched to Concerta 72 mg every morning.  September 14, 2019.  She was having persistent bothersome OCD and was interested in further medicine changes to try to help with these symptoms because she has aggressively pursued therapy and still has significant symptoms.  She has been on multiple SSRIs and so we initiated augmentation with risperidone half milligram daily to increase to 1 mg daily. Sig improvement with risperidone.  SE low appetite with it.  Not hungry for dinner. Sleep so much better also.  Improvement is "marked".  Think better bc less obsessing time.  Not constant now.  Noticed benefit within the first week.  Less obsessing lessened depression.  But residual symptoms.  visit October 14, 2019.  She was encouraged to experiment with the dosing of the risperidone up to 2 mg or 3 mg nightly to see if she could get additional reduction in her chronic anxiety and OCD.   seen December 15, 2019.  Settled at 2 mg risperidone bc increased sleep.  Sleep is so much better.  Harder to get up in the morning.   Found out insurance won't cover Concerta anymore.  Not as good on Ritalin TID.  Not as even a response.  Terrible insurance. Therefore the only med change with switching to Focalin to see if it would be covered better.  Appt 03/09/20, switch  to Concerta 72 mg is better with better mood and motivation vs Focalin.  Focus is also better.  Very poor function with Focalin and better with Concerta.   Better overall with more even response to stimulant and better appetite.  No SE.  Duration all day.  Less desperate hopeless feelings on the stimulant.  Stressful times. With increase risperidone a little better mood bump.  SE a little shakiness noticed in jaw.   Not unusual levels of anxiety.  Lost her best friend 1 year ago and still grieving.  Not much family. Alone.  Not close to B and S who live elsewhere.  Talks to God daily.  Gets to work daily.  Function is good there.  Reads and calls people.  OCD no worse with Covid. Did join a Bible study which was a huge step.  [name] and describes anxiety as mild- Moderate. 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  Anxiety symptoms include: Excessive Worry, Obsessive Compulsive Symptoms:   Handwashing,,.OCD is good with risperidone.  Anxiety is better than last visit.   Less anxiety with the OCD in the past.  Pt reports no sleep issues, it's better 7-8 hours. Pt reports that appetite is good. Pt reports that energy is good and down slightly. Concentration is no change. Suicidal thoughts:  denied by patient.  Numerous  failed psychiatric medication trials include Lexapro 60 mg a day, Zoloft 250, Paxil with weight gain,  Clomipramine,  Wellbutrin 300 with side effects, l Rexulti which helped initially,  Vraylar, Abilify between 1 and 2.5 mg and up to 15 mg with no response,  buspirone 30 mg,  Cytomel, ithium 625, pramipexole with insomnia,   Lunesta with crying spells,  topiramate, Deplin, propranolol,   Ritalin, Focalin NR, Concerta 72 helped No history of Prozac, Viibryd.  Review of Systems:  Review of Systems  Constitutional: Positive for appetite change. Negative for diaphoresis and fatigue.  Gastrointestinal: Negative for abdominal  pain.  Neurological: Positive for headaches. Negative for dizziness, tremors, weakness and light-headedness.  Psychiatric/Behavioral: Positive for depression. Negative for agitation, behavioral problems, confusion, decreased concentration, hallucinations, self-injury, sleep disturbance and suicidal ideas. The patient is not hyperactive.     Medications: I have reviewed the patient's current medications.  Current Outpatient Medications  Medication Sig Dispense Refill  . Acetylcysteine (NAC) 600 MG CAPS Take 1,200 mg by mouth 2 (two) times daily.    Marland Kitchen ALPRAZolam (XANAX) 0.25 MG tablet Take 0.25 mg by mouth daily as needed for anxiety.     Marland Kitchen atorvastatin (LIPITOR) 20 MG tablet TAKE ONE TABLET BY MOUTH ONCE DAILY 90 tablet 1  . Calcium Carbonate-Vitamin D (OSCAL 500/200 D-3 PO) Take by mouth.    . Cholecalciferol (VITAMIN D PO) Take by mouth.    . levothyroxine (SYNTHROID) 112 MCG tablet TAKE 1 TABLET (112 MCG TOTAL) BY MOUTH DAILY BEFORE BREAKFAST. 90 tablet 1  . [START ON 05/04/2020] methylphenidate (CONCERTA) 36 MG PO CR tablet Take 2 tablets (72 mg total) by mouth daily. 60 tablet 0  . mirtazapine (REMERON) 30 MG tablet TAKE 2 TABLETS BY MOUTH AT BEDTIME 180 tablet 1  . PARoxetine (PAXIL) 40 MG tablet TAKE 1 TABLET BY MOUTH TWICE A DAY 180 tablet 1  . risedronate (ACTONEL) 150 MG tablet TAKE 1 TABLET EVERY 30 DAYS WITH WATER ON EMPTY STOMACH, NOTHING BY MOUTH OR LIE DOWN FOR 3 tablet 1  . risperiDONE (RISPERDAL) 2 MG tablet Take 1.5 tablets (3 mg total) by mouth at bedtime. 135 tablet 0  . SUMAtriptan (IMITREX) 100 MG tablet Take 100 mg by mouth daily as needed for migraine.    . topiramate (TOPAMAX) 100 MG tablet TAKE 1 TABLET BY MOUTH TWICE A DAY 180 tablet 1  . Dexmethylphenidate HCl 40 MG CP24 Take 1 capsule (40 mg total) by mouth every morning. (Patient not taking: Reported on 03/09/2020) 30 capsule 0  . Dexmethylphenidate HCl 40 MG CP24 Take 1 capsule (40 mg) by mouth every morning  (Patient not taking: Reported on 03/09/2020) 30 capsule 0  . Dexmethylphenidate HCl 40 MG CP24 Take 1 capsule (40 mg) by mouth every morning (Patient not taking: Reported on 03/09/2020) 30 capsule 0  . lamoTRIgine (LAMICTAL) 25 MG tablet 1 for 2 weeks, then 2 for 2 weeks, then 4 tablets daily 120 tablet 1  . [START ON 04/06/2020] methylphenidate (CONCERTA) 36 MG PO CR tablet Take 2 tablets (72 mg total) by mouth daily. 60 tablet 0  . methylphenidate (CONCERTA) 36 MG PO CR tablet Take 2 tablets (72 mg total) by mouth daily. 60 tablet 0   No current facility-administered medications for this visit.    Medication Side Effects:  Watch jaw tremor  Allergies:  Allergies  Allergen Reactions  . Ampicillin Rash    Past Medical History:  Diagnosis Date  . Gum lesion  Biopsy done-benign  . Hypercholesteremia   . Hypothyroid   . LGSIL (low grade squamous intraepithelial dysplasia) 2010  . Migraines   . OCD (obsessive compulsive disorder)   . Osteoporosis 01/2016   T score -2.7    Family History  Problem Relation Age of Onset  . Hypertension Mother   . Heart disease Mother   . Hypertension Father   . Breast cancer Paternal Aunt        Age 20  . Diabetes Neg Hx     Social History   Socioeconomic History  . Marital status: Single    Spouse name: Not on file  . Number of children: Not on file  . Years of education: Not on file  . Highest education level: Not on file  Occupational History  . Not on file  Tobacco Use  . Smoking status: Never Smoker  . Smokeless tobacco: Never Used  Substance and Sexual Activity  . Alcohol use: Yes    Alcohol/week: 0.0 standard drinks    Comment: wine occassionally  . Drug use: No  . Sexual activity: Never    Birth control/protection: Post-menopausal    Comment: 1st intercourse 41 yo-5 partners  Other Topics Concern  . Not on file  Social History Narrative  . Not on file   Social Determinants of Health   Financial Resource Strain:   .  Difficulty of Paying Living Expenses:   Food Insecurity:   . Worried About Programme researcher, broadcasting/film/video in the Last Year:   . Barista in the Last Year:   Transportation Needs:   . Freight forwarder (Medical):   Marland Kitchen Lack of Transportation (Non-Medical):   Physical Activity:   . Days of Exercise per Week:   . Minutes of Exercise per Session:   Stress:   . Feeling of Stress :   Social Connections:   . Frequency of Communication with Friends and Family:   . Frequency of Social Gatherings with Friends and Family:   . Attends Religious Services:   . Active Member of Clubs or Organizations:   . Attends Banker Meetings:   Marland Kitchen Marital Status:   Intimate Partner Violence:   . Fear of Current or Ex-Partner:   . Emotionally Abused:   Marland Kitchen Physically Abused:   . Sexually Abused:     Past Medical History, Surgical history, Social history, and Family history were reviewed and updated as appropriate.   Please see review of systems for further details on the patient's review from today.   Objective:   Physical Exam:  LMP 11/19/2010   Physical Exam Constitutional:      General: She is not in acute distress.    Appearance: Normal appearance. She is well-developed.  Musculoskeletal:        General: No deformity.  Neurological:     Mental Status: She is alert and oriented to person, place, and time.     Coordination: Coordination normal.  Psychiatric:        Attention and Perception: She is attentive. She does not perceive auditory hallucinations.        Mood and Affect: Mood is anxious and depressed. Affect is not labile, blunt, angry or inappropriate.        Speech: Speech normal.        Behavior: Behavior normal.        Thought Content: Thought content normal. Thought content does not include homicidal or suicidal ideation. Thought content does not include homicidal or  suicidal plan.        Cognition and Memory: Cognition normal.        Judgment: Judgment normal.      Comments: Insight intact. No auditory or visual hallucinations. No delusions.  Depression anxiety and OCD are all worse than the last visit.     Lab Review:     Component Value Date/Time   NA 140 10/26/2019 0800   K 4.2 10/26/2019 0800   CL 107 10/26/2019 0800   CO2 25 10/26/2019 0800   GLUCOSE 102 (H) 10/26/2019 0800   BUN 20 10/26/2019 0800   CREATININE 0.74 10/26/2019 0800   CALCIUM 10.5 10/26/2019 0800   PROT 6.7 04/13/2019 0849   ALBUMIN 4.1 04/13/2019 0849   AST 21 04/13/2019 0849   ALT 16 04/13/2019 0849   ALKPHOS 40 04/13/2019 0849   BILITOT 0.2 04/13/2019 0849       Component Value Date/Time   WBC 5.9 01/29/2016 0834   RBC 4.85 01/29/2016 0834   HGB 15.2 (H) 01/29/2016 0834   HCT 46.2 (H) 01/29/2016 0834   PLT 232.0 01/29/2016 0834   MCV 95.3 01/29/2016 0834   MCHC 33.0 01/29/2016 0834   RDW 14.7 01/29/2016 0834   LYMPHSABS 1.3 01/29/2016 0834   MONOABS 0.4 01/29/2016 0834   EOSABS 0.3 01/29/2016 0834   BASOSABS 0.0 01/29/2016 0834    No results found for: POCLITH, LITHIUM   No results found for: PHENYTOIN, PHENOBARB, VALPROATE, CBMZ   .res Assessment: Plan:    Liddy was seen today for follow-up, add, anxiety and depression.  Diagnoses and all orders for this visit:  Mixed obsessional thoughts and acts -     lamoTRIgine (LAMICTAL) 25 MG tablet; 1 for 2 weeks, then 2 for 2 weeks, then 4 tablets daily  Major depressive disorder, recurrent episode, moderate (HCC) -     lamoTRIgine (LAMICTAL) 25 MG tablet; 1 for 2 weeks, then 2 for 2 weeks, then 4 tablets daily  Social anxiety disorder  Attention deficit hyperactivity disorder (ADHD), predominantly inattentive type -     methylphenidate (CONCERTA) 36 MG PO CR tablet; Take 2 tablets (72 mg total) by mouth daily. -     methylphenidate (CONCERTA) 36 MG PO CR tablet; Take 2 tablets (72 mg total) by mouth daily. -     methylphenidate (CONCERTA) 36 MG PO CR tablet; Take 2 tablets (72 mg total) by mouth  daily.  Insomnia due to psychological stress   Greater than 50% of 30 min face to face time with patient was spent on counseling and coordination of care. We discussed TRD and anxiety but has partial response with the higher dosage of mirtazapine 60mg  HS. she has a long list of failed psychiatric meds.  She is not tended to do well with higher dosages of SSRIs.  Tolerating mirtazapine and it does help her depression.  She has chronic residual anxiety which is significant.  Overall better with increase risperidone and switch back to Concerta 72 AM . . We prescribed risperidone as an augmentation strategy for OCD and treatment resistant anxiety.  She has had significant improvement.  It is also improved her sleep.  She is happy with this benefit but still has significant residual symptoms.  She is continuing 2 mg daily but is considering going higher..  Concerta 72 mg covered and works better   Off label option of selegiline and MAOI restrictions disc in detail.  This is a fairly risky medicine change.  [place] also now approved for  OCD.. We will continue her other medications for anxiety.  She is aware that she is on a high dose but she is tolerating them and gained additional benefit from the higher dosages.  She is aware of the risk of serotonin syndrome. Continue high dosage paroxetine 80 mg daily. Tolerated.  She is continuing psychotherapy with Dr. Farrel DemarkMitchum  Consider off label lamotrigine, Latuda, risperidone which has the best literature support. Better with increase risperidone to 3 mg daily. No further increase DT jaw tremor.  Watch this. Discussed potential metabolic side effects associated with atypical antipsychotics, as well as potential risk for movement side effects. Advised pt to contact office if movement side effects occur.  She agrees with the plan Counseled patient regarding potential benefits, risks, and side effects of Lamictal to include potential risk of Stevens-Johnson  syndrome. Advised patient to stop taking Lamictal and contact office immediately if rash develops and to seek urgent medical attention if rash is severe and/or spreading quickly. She wants to try Lamotrigine 25-100 mg daily.  Follow-up 2 months  Meredith Staggersarey Cottle MD, DFAPA  Future Appointments  Date Time Provider Department Center  04/12/2020  5:00 PM Robley FriesMitchum, Robert, PhD [facility] None  05/02/2020  8:00 AM [facility] None  05/04/2020  8:15 AM Reather LittlerKumar, Ajay, MD [facility] None  02/06/2021  3:30 PM Theresia MajorsKendall, Jeffrey, MD [facility]    No orders of the defined types were placed in this encounter.     -------------------------------

## 2020-03-14 DIAGNOSIS — R1010 Upper abdominal pain, unspecified: Secondary | ICD-10-CM | POA: Diagnosis not present

## 2020-03-14 DIAGNOSIS — Z1211 Encounter for screening for malignant neoplasm of colon: Secondary | ICD-10-CM | POA: Diagnosis not present

## 2020-04-04 ENCOUNTER — Encounter: Payer: Self-pay | Admitting: Psychiatry

## 2020-04-04 ENCOUNTER — Other Ambulatory Visit: Payer: Self-pay

## 2020-04-04 ENCOUNTER — Ambulatory Visit (INDEPENDENT_AMBULATORY_CARE_PROVIDER_SITE_OTHER): Payer: BC Managed Care – PPO | Admitting: Psychiatry

## 2020-04-04 DIAGNOSIS — F4010 Social phobia, unspecified: Secondary | ICD-10-CM | POA: Diagnosis not present

## 2020-04-04 MED ORDER — ALPRAZOLAM 0.25 MG PO TABS: 0.2500 mg | ORAL_TABLET | Freq: Every day | ORAL | 0 refills | Status: DC | PRN

## 2020-04-04 NOTE — Progress Notes (Signed)
Ashlee Chavez [id number] 12/29/1960 59 y.o.  Subjective:   Patient ID:  Ashlee Chavez is a 59 y.o. (DOB September 26, 1961) female.  Chief Complaint:  Chief Complaint  Patient presents with  . Follow-up  . Depression  . Anxiety  . Medication Problem    Depression        Associated symptoms include appetite change and headaches.  Associated symptoms include no decreased concentration, no fatigue and no suicidal ideas.  Ashlee Chavez presents today for follow-up of recently worse depression DT relationship loss.    When seen February 10, 2019.  She had received some benefit from Ritalin 20 mg 3 times daily but was having issues with the crash.  We switched to Concerta 72 mg every morning.  September 14, 2019.  She was having persistent bothersome OCD and was interested in further medicine changes to try to help with these symptoms because she has aggressively pursued therapy and still has significant symptoms.  She has been on multiple SSRIs and so we initiated augmentation with risperidone half milligram daily to increase to 1 mg daily. Sig improvement with risperidone.  SE low appetite with it.  Not hungry for dinner. Sleep so much better also.  Improvement is "marked".  Think better bc less obsessing time.  Not constant now.  Noticed benefit within the first week.  Less obsessing lessened depression.  But residual symptoms.  visit October 14, 2019.  She was encouraged to experiment with the dosing of the risperidone up to 2 mg or 3 mg nightly to see if she could get additional reduction in her chronic anxiety and OCD.   seen December 15, 2019.  Settled at 2 mg risperidone bc increased sleep.  Sleep is so much better.  Harder to get up in the morning.   Found out insurance won't cover Concerta anymore.  Not as good on Ritalin TID.  Not as even a response.  Terrible insurance. Therefore the only med change with switching to Focalin to see if it would be covered better.  Appt 03/09/20, switch  to Concerta 72 mg is better with better mood and motivation vs Focalin.  Focus is also better.  Very poor function with Focalin and better with Concerta.   Better overall with more even response to stimulant and better appetite.  No SE.  Duration all day.  Less desperate hopeless feelings on the stimulant.  Stressful times. With increase risperidone a little better mood bump.  SE a little shakiness noticed in jaw.   Not unusual levels of anxiety.  Lost her best friend 1 year ago and still grieving.  Not much family. Alone.  Not close to B and S who live elsewhere.  Talks to God daily.  Gets to work daily.  Function is good there.  Reads and calls people.  OCD no worse with Covid. Did join a Bible study which was a huge step. Plan start lamotrigine   04/04/20 appt with the following noted: Appt moved up.  She thought she was supposed to stop risperidone when started lamotrigine.  Started having SI off the risperidone but wasn't sure if it was SE.  No risperidone 3 mg for 3 weeks.Anxiety shaking sweating crying all started right away.  Also trouble with sleep and can't eat.  [name] and describes anxiety as mild- Moderate. 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  Anxiety symptoms include: Excessive Worry, Obsessive Compulsive Symptoms:  Handwashing,,.OCD is good with risperidone.  Anxiety is better than last visit.   Less anxiety with the OCD in the past.  Pt reports no sleep issues, it's better 7-8 hours. Pt reports that appetite is good. Pt reports that energy is good and down slightly. Concentration is no change. Suicidal thoughts:  denied by patient.  Numerous failed psychiatric medication trials include Lexapro 60 mg a day, Zoloft 250, Paxil with weight gain,  Clomipramine,  Wellbutrin 300 with side effects, l Rexulti which helped initially,  Vraylar, Abilify between 1 and 2.5 mg and up to 15 mg with no response,  buspirone 30 mg,   Cytomel, ithium 625, pramipexole with insomnia,   Lunesta with crying spells,  topiramate, Deplin, propranolol,   Ritalin, Focalin NR, Concerta 72 helped No history of Prozac, Viibryd.  Review of Systems:  Review of Systems  Constitutional: Positive for appetite change. Negative for diaphoresis and fatigue.  Gastrointestinal: Negative for abdominal pain and nausea.  Neurological: Positive for headaches. Negative for dizziness, tremors, weakness and light-headedness.  Psychiatric/Behavioral: Positive for depression. Negative for agitation, behavioral problems, confusion, decreased concentration, hallucinations, self-injury, sleep disturbance and suicidal ideas. The patient is not hyperactive.     Medications: I have reviewed the patient's current medications.  Current Outpatient Medications  Medication Sig Dispense Refill  . Acetylcysteine (NAC) 600 MG CAPS Take 1,200 mg by mouth 2 (two) times daily.    Marland Kitchen. ALPRAZolam (XANAX) 0.25 MG tablet Take 1 tablet (0.25 mg total) by mouth daily as needed for anxiety. 30 tablet 0  . atorvastatin (LIPITOR) 20 MG tablet TAKE ONE TABLET BY MOUTH ONCE DAILY 90 tablet 1  . Calcium Carbonate-Vitamin D (OSCAL 500/200 D-3 PO) Take by mouth.    . Cholecalciferol (VITAMIN D PO) Take by mouth.    . levothyroxine (SYNTHROID) 112 MCG tablet TAKE 1 TABLET (112 MCG TOTAL) BY MOUTH DAILY BEFORE BREAKFAST. 90 tablet 1  . [START ON 05/04/2020] methylphenidate (CONCERTA) 36 MG PO CR tablet Take 2 tablets (72 mg total) by mouth daily. 60 tablet 0  . [START ON 04/06/2020] methylphenidate (CONCERTA) 36 MG PO CR tablet Take 2 tablets (72 mg total) by mouth daily. 60 tablet 0  . methylphenidate (CONCERTA) 36 MG PO CR tablet Take 2 tablets (72 mg total) by mouth daily. 60 tablet 0  . mirtazapine (REMERON) 30 MG tablet TAKE 2 TABLETS BY MOUTH AT BEDTIME 180 tablet 1  . PARoxetine (PAXIL) 40 MG tablet TAKE 1 TABLET BY MOUTH TWICE A DAY 180 tablet 1  . risedronate (ACTONEL) 150  MG tablet TAKE 1 TABLET EVERY 30 DAYS WITH WATER ON EMPTY STOMACH, NOTHING BY MOUTH OR LIE DOWN FOR 30MIN 3 tablet 1  . SUMAtriptan (IMITREX) 100 MG tablet Take 100 mg by mouth daily as needed for migraine.    . topiramate (TOPAMAX) 100 MG tablet TAKE 1 TABLET BY MOUTH TWICE A DAY 180 tablet 1  . lamoTRIgine (LAMICTAL) 25 MG tablet 1 for 2 weeks, then 2 for 2 weeks, then 4 tablets daily (Patient not taking: Reported on 04/04/2020) 120 tablet 1  . risperiDONE (RISPERDAL) 2 MG tablet Take 1.5 tablets (3 mg total) by mouth at bedtime. (Patient not taking: Reported on 04/04/2020) 135 tablet 0   No current facility-administered medications for this visit.    Medication Side Effects:  Watch jaw tremor  Allergies:  Allergies  Allergen Reactions  . Ampicillin Rash    Past Medical History:  Diagnosis Date  . Gum lesion  Biopsy done-benign  . Hypercholesteremia   . Hypothyroid   . LGSIL (low grade squamous intraepithelial dysplasia) 2010  . Migraines   . OCD (obsessive compulsive disorder)   . Osteoporosis 01/2016   T score -2.7    Family History  Problem Relation Age of Onset  . Hypertension Mother   . Heart disease Mother   . Hypertension Father   . Breast cancer Paternal Aunt        Age 93  . Diabetes Neg Hx     Social History   Socioeconomic History  . Marital status: Single    Spouse name: Not on file  . Number of children: Not on file  . Years of education: Not on file  . Highest education level: Not on file  Occupational History  . Not on file  Tobacco Use  . Smoking status: Never Smoker  . Smokeless tobacco: Never Used  Substance and Sexual Activity  . Alcohol use: Yes    Alcohol/week: 0.0 standard drinks    Comment: wine occassionally  . Drug use: No  . Sexual activity: Never    Birth control/protection: Post-menopausal    Comment: 1st intercourse 52 yo-5 partners  Other Topics Concern  . Not on file  Social History Narrative  . Not on file   Social  Determinants of Health   Financial Resource Strain:   . Difficulty of Paying Living Expenses:   Food Insecurity:   . Worried About Programme researcher, broadcasting/film/video in the Last Year:   . Barista in the Last Year:   Transportation Needs:   . Freight forwarder (Medical):   Marland Kitchen Lack of Transportation (Non-Medical):   Physical Activity:   . Days of Exercise per Week:   . Minutes of Exercise per Session:   Stress:   . Feeling of Stress :   Social Connections:   . Frequency of Communication with Friends and Family:   . Frequency of Social Gatherings with Friends and Family:   . Attends Religious Services:   . Active Member of Clubs or Organizations:   . Attends Banker Meetings:   Marland Kitchen Marital Status:   Intimate Partner Violence:   . Fear of Current or Ex-Partner:   . Emotionally Abused:   Marland Kitchen Physically Abused:   . Sexually Abused:     Past Medical History, Surgical history, Social history, and Family history were reviewed and updated as appropriate.   Please see review of systems for further details on the patient's review from today.   Objective:   Physical Exam:  LMP 11/19/2010   Physical Exam Constitutional:      General: She is not in acute distress.    Appearance: Normal appearance. She is well-developed.  Musculoskeletal:        General: No deformity.  Neurological:     Mental Status: She is alert and oriented to person, place, and time.     Coordination: Coordination normal.  Psychiatric:        Attention and Perception: She is attentive. She does not perceive auditory hallucinations.        Mood and Affect: Mood is anxious and depressed. Affect is not labile, blunt, angry or inappropriate.        Speech: Speech normal.        Behavior: Behavior normal.        Thought Content: Thought content includes suicidal ideation. Thought content does not include homicidal ideation. Thought content does not include homicidal or  suicidal plan.        Cognition and  Memory: Cognition normal.        Judgment: Judgment normal.     Comments: Insight intact. No auditory or visual hallucinations. No delusions.  Depression anxiety and OCD are all worse than the last visit bc stopping risperidone      Lab Review:     Component Value Date/Time   NA 140 10/26/2019 0800   K 4.2 10/26/2019 0800   CL 107 10/26/2019 0800   CO2 25 10/26/2019 0800   GLUCOSE 102 (H) 10/26/2019 0800   BUN 20 10/26/2019 0800   CREATININE 0.74 10/26/2019 0800   CALCIUM 10.5 10/26/2019 0800   PROT 6.7 04/13/2019 0849   ALBUMIN 4.1 04/13/2019 0849   AST 21 04/13/2019 0849   ALT 16 04/13/2019 0849   ALKPHOS 40 04/13/2019 0849   BILITOT 0.2 04/13/2019 0849       Component Value Date/Time   WBC 5.9 01/29/2016 0834   RBC 4.85 01/29/2016 0834   HGB 15.2 (H) 01/29/2016 0834   HCT 46.2 (H) 01/29/2016 0834   PLT 232.0 01/29/2016 0834   MCV 95.3 01/29/2016 0834   MCHC 33.0 01/29/2016 0834   RDW 14.7 01/29/2016 0834   LYMPHSABS 1.3 01/29/2016 0834   MONOABS 0.4 01/29/2016 0834   EOSABS 0.3 01/29/2016 0834   BASOSABS 0.0 01/29/2016 0834    No results found for: POCLITH, LITHIUM   No results found for: PHENYTOIN, PHENOBARB, VALPROATE, CBMZ   .res Assessment: Plan:    Evagelia was seen today for follow-up, depression, anxiety and medication problem.  Diagnoses and all orders for this visit:  Social anxiety disorder -     ALPRAZolam (XANAX) 0.25 MG tablet; Take 1 tablet (0.25 mg total) by mouth daily as needed for anxiety.   We discussed TRD and anxiety but has partial response with the higher dosage of mirtazapine 60mg  HS. she has a long list of failed psychiatric meds.  She has not tended to do well with higher dosages of SSRIs.  Tolerating mirtazapine and it does help her depression.  She has chronic residual anxiety which is significant. She is however markedly worse with anxiety depression and other symptoms as noted after stopping risperidone by mistake.  She  misunderstood instructions about starting lamotrigine and she is acutely worse.  These symptoms are almost certainly due to stopping risperidone and have nothing to do with lamotrigine.  Restart risperidone 1 mg now and another milligram in 2 to 3 hours if not feeling sufficient relief to get the full dose of 3 mg today.  Then resume 3 mg risperidone each evening. Restart lamotrigine 25 mg daily for 2 weeks and follow the previous instructions on increasing it.  She was given reassurance that her symptoms are clearly related to stopping risperidone acutely by mistake and she will get relief very quickly.  She will call the office in 2 days if not markedly improve as expected.  ADD still better with Concerta 72 mg versus Focalin. . . Continue high dosage paroxetine 80 mg daily. Tolerated.  She is continuing psychotherapy with Dr. Rica Mote  Discussed potential metabolic side effects associated with atypical antipsychotics, as well as potential risk for movement side effects. Advised pt to contact office if movement side effects occur.  She agrees with the plan Counseled patient regarding potential benefits, risks, and side effects of Lamictal to include potential risk of Stevens-Johnson syndrome. Advised patient to stop taking Lamictal and contact office immediately if rash  develops and to seek urgent medical attention if rash is severe and/or spreading quickly.  Follow-up in 5 weeks as scheduled  Meredith Staggers MD, DFAPA  Future Appointments  Date Time Provider Department Center  04/12/2020  5:00 PM Robley Fries, PhD [facility] None  05/02/2020  8:00 AM [facility] None  05/04/2020  8:15 AM Reather Littler, MD [facility] None  05/11/2020  4:00 PM Cottle, Steva Ready., MD [facility] None  02/06/2021  3:30 PM Theresia Majors, MD [facility]    No orders of the defined types were placed in this encounter.     -------------------------------

## 2020-04-12 ENCOUNTER — Ambulatory Visit (INDEPENDENT_AMBULATORY_CARE_PROVIDER_SITE_OTHER): Payer: BC Managed Care – PPO | Admitting: Psychiatry

## 2020-04-12 ENCOUNTER — Other Ambulatory Visit: Payer: Self-pay

## 2020-04-12 DIAGNOSIS — F422 Mixed obsessional thoughts and acts: Secondary | ICD-10-CM | POA: Diagnosis not present

## 2020-04-12 DIAGNOSIS — F4010 Social phobia, unspecified: Secondary | ICD-10-CM | POA: Diagnosis not present

## 2020-04-12 DIAGNOSIS — F331 Major depressive disorder, recurrent, moderate: Secondary | ICD-10-CM | POA: Diagnosis not present

## 2020-04-12 DIAGNOSIS — G43719 Chronic migraine without aura, intractable, without status migrainosus: Secondary | ICD-10-CM | POA: Diagnosis not present

## 2020-04-12 DIAGNOSIS — G43019 Migraine without aura, intractable, without status migrainosus: Secondary | ICD-10-CM | POA: Diagnosis not present

## 2020-04-12 DIAGNOSIS — R519 Headache, unspecified: Secondary | ICD-10-CM | POA: Diagnosis not present

## 2020-04-12 NOTE — Progress Notes (Signed)
Psychotherapy Progress Note [facility] [name], PhD LP  Patient ID: Ashlee Chavez     MRN: [id number] Therapy format: Individual psychotherapy Date: 04/12/2020      Start: 5:08p     Stop: 5:57p     Time Spent: 49 min Location: In-person   Session narrative (presenting needs, interim history, self-report of stressors and symptoms, applications of prior therapy, status changes, and interventions made in session) "We took quite a ride lately."  Mistakenly stopped risperidone abruptly while tapering on lamotrigine.  Felt horrible, figured it for SE of lamotrigine, stopped that, didn't call anyone.  Eventually asked for appt, was told it's not available, white-knuckled another weekend, then called experienced staff, got in.  Admitted to boss she was unwell.  During the course of it, had an insurance foulup to figure out, called brother to help figure it out, eventually discovered she cross-paid health and Secretary/administrator.  Rattling to feel so out of control, get a glimpse of what dementia and institutional helplessness might feel like, and let B and S know her vulnerability.  Church has now opened up a semi-segregated mass, with maskers on one side, non-maskers on the other.  Agreed it does not make medical or community sense.  Considering whether to attend or remain remote.  Therapeutic modalities: Cognitive Behavioral Therapy and Solution-Oriented/Positive Psychology  Mental Status/Observations:  Appearance:   Casual and Neat     Behavior:  Appropriate  Motor:  Normal  Speech/Language:   Clear and Coherent  Affect:  Appropriate  Mood:  anxious  Thought process:  normal  Thought content:    WNL and worry  Sensory/Perceptual disturbances:    WNL  Orientation:  Fully oriented  Attention:  Good    Concentration:  Good  Memory:  WNL  Insight:    Good  Judgment:   Good  Impulse Control:  Good   Risk Assessment: Danger to Self: No Self-injurious Behavior:  No Danger to Others: No Physical Aggression / Violence: No Duty to Warn: No Access to Firearms a concern: No  Assessment of progress:  progressing  Diagnosis:   ICD-10-CM   1. Mixed obsessional thoughts and acts  F42.2   2. Social anxiety disorder  F40.10   3. Major depressive disorder, recurrent episode, moderate (HCC)  F33.1    Plan:  . Self-affirm how she worked out mistakes, de-emphasize having made them, just stay ready to ask help if future mixup . Other recommendations/advice as may be noted above . Continue to utilize previously learned skills ad lib . Maintain medication as prescribed and work faithfully with relevant prescriber(s) if any changes are desired or seem indicated . Call the clinic on-call service, present to ER, or call 911 if any life-threatening psychiatric crisis Return for time as available. . Already scheduled visit in this office 05/11/2020.  Robley Fries, PhD [name], PhD LP Clinical Psychologist, [facility] [address], [address] [place], [place] [address] [phone]

## 2020-04-14 DIAGNOSIS — Z03818 Encounter for observation for suspected exposure to other biological agents ruled out: Secondary | ICD-10-CM | POA: Diagnosis not present

## 2020-04-19 DIAGNOSIS — R1013 Epigastric pain: Secondary | ICD-10-CM | POA: Diagnosis not present

## 2020-04-19 DIAGNOSIS — K259 Gastric ulcer, unspecified as acute or chronic, without hemorrhage or perforation: Secondary | ICD-10-CM | POA: Diagnosis not present

## 2020-04-19 DIAGNOSIS — K2930 Chronic superficial gastritis without bleeding: Secondary | ICD-10-CM | POA: Diagnosis not present

## 2020-05-02 ENCOUNTER — Other Ambulatory Visit: Payer: Self-pay

## 2020-05-02 ENCOUNTER — Other Ambulatory Visit (INDEPENDENT_AMBULATORY_CARE_PROVIDER_SITE_OTHER): Payer: BC Managed Care – PPO

## 2020-05-02 DIAGNOSIS — M810 Age-related osteoporosis without current pathological fracture: Secondary | ICD-10-CM

## 2020-05-02 DIAGNOSIS — E7800 Pure hypercholesterolemia, unspecified: Secondary | ICD-10-CM | POA: Diagnosis not present

## 2020-05-02 DIAGNOSIS — E559 Vitamin D deficiency, unspecified: Secondary | ICD-10-CM | POA: Diagnosis not present

## 2020-05-02 DIAGNOSIS — E063 Autoimmune thyroiditis: Secondary | ICD-10-CM | POA: Diagnosis not present

## 2020-05-02 DIAGNOSIS — R7301 Impaired fasting glucose: Secondary | ICD-10-CM

## 2020-05-02 LAB — COMPREHENSIVE METABOLIC PANEL
ALT: 19 U/L (ref 0–35)
AST: 19 U/L (ref 0–37)
Albumin: 4.5 g/dL (ref 3.5–5.2)
Alkaline Phosphatase: 38 U/L — ABNORMAL LOW (ref 39–117)
BUN: 21 mg/dL (ref 6–23)
CO2: 25 mEq/L (ref 19–32)
Calcium: 9.5 mg/dL (ref 8.4–10.5)
Chloride: 108 mEq/L (ref 96–112)
Creatinine, Ser: 0.7 mg/dL (ref 0.40–1.20)
GFR: 85.2 mL/min (ref >60.00)
Glucose, Bld: 98 mg/dL (ref 70–99)
Potassium: 3.8 mEq/L (ref 3.5–5.1)
Sodium: 136 mEq/L (ref 135–145)
Total Bilirubin: 0.3 mg/dL (ref 0.2–1.2)
Total Protein: 6.7 g/dL (ref 6.0–8.3)

## 2020-05-02 LAB — HEMOGLOBIN A1C: Hgb A1c MFr Bld: 5.5 % (ref 4.6–6.5)

## 2020-05-02 LAB — TSH: TSH: 0.14 u[IU]/mL — ABNORMAL LOW (ref 0.35–4.50)

## 2020-05-02 LAB — T4, FREE: Free T4: 0.98 ng/dL (ref 0.60–1.60)

## 2020-05-02 LAB — LIPID PANEL
Cholesterol: 176 mg/dL (ref 0–200)
HDL: 68.7 mg/dL (ref >39.00)
LDL Cholesterol: 89 mg/dL (ref 0–99)
NonHDL: 106.89
Total CHOL/HDL Ratio: 3
Triglycerides: 88 mg/dL (ref 0.0–149.0)
VLDL: 17.6 mg/dL (ref 0.0–40.0)

## 2020-05-02 LAB — VITAMIN D 25 HYDROXY (VIT D DEFICIENCY, FRACTURES): VITD: 50.65 ng/mL (ref 30.00–100.00)

## 2020-05-03 NOTE — Progress Notes (Signed)
Patient ID: Ashlee Chavez, female   DOB: 1961-08-01, 59 y.o.   MRN: [id number]           Chief complaint: Endocrinology follow-up  History of Present Illness:    OSTEOPOROSIS:   She had a bone density done in 2017 and this showed the following T-scores: Femoral neck: -2.7, previously -1.8 Spine: -1.9  By comparison she had a decline of 8-10 % in her bone density from 2 years before and 15% result from baseline She initially had a half inch height loss from baseline  She could not afford Evista and did not want to consider Reclast because of the possible high one time cost  In 05/2016 she was started on treatment with risedronate 150 mg for her declining bone density  She is taking this once a month on empty stomach as before No GI side effects with this  She is taking calcium supplements with Os-Cal. Also regular with taking 5000 units of vitamin D with adequate vitamin D levels  BONE density: Repeat study done by gynecologist in April 21 shows T-score of the femoral neck to be -2.4 compared to -2.7  LABS:  Lab Results  Component Value Date   VD25OH 50.65 05/02/2020   VD25OH 48.34 04/13/2019    HYPOTHYROIDISM:  She had a goiter diagnosed in 1996. She had a positive peroxidase antibody and has been on thyroxine since 1996  Her TSH was high in 2006 and she had symptoms of fatigue, cold intolerance and some hair loss  Since then has been on 75 mcg of Synthroid with occasional adjustments in the dose  Since 01/2014 she had been taking the same dose of levothyroxine, 75 g daily  Her TSH was higher than usual in 11/16 and she was having complaints about depression, insomnia, fatigue and more difficulties with her anxiety  Has had chronic cold sensitivity   Her dose was increased in 7/17 and in 1/19 it was further increased to to 137 mcg because of a high TSH She was not having any unusual fatigue but her TSH was gradually increasing She was initially having some  palpitations and shakiness but no symptoms on the visit when she was on 125 mcg  Recent history:  Her levothyroxine dose was reduced progressively down to 112 mcg in 03/2018  She does not complain of any fatigue now She had lost weight about a month ago because of anxiety and decreased appetite No palpitations or shakiness currently  She does have a history of a small goiter  He has been consistent with taking the levothyroxine in the morning before breakfast.  She will take her multivitamin and calcium separately  She prefers to take generic levothyroxine  TSH is now only 0.14 compared to 1.26  Wt Readings from Last 3 Encounters:  05/04/20 110 lb (49.9 kg)  02/04/20 115 lb (52.2 kg)  10/29/19 116 lb (52.6 kg)    Lab Results  Component Value Date   TSH 0.14 (L) 05/02/2020   TSH 1.26 10/26/2019   TSH 1.59 04/13/2019   FREET4 0.98 05/02/2020   FREET4 0.82 10/26/2019   FREET4 0.70 04/13/2019    OTHER active problems discussed and review of systems   Past Medical History:  Diagnosis Date   Gum lesion    Biopsy done-benign   Hypercholesteremia    Hypothyroid    LGSIL (low grade squamous intraepithelial dysplasia) 2010   Migraines    OCD (obsessive compulsive disorder)    Osteoporosis 01/2016   T  score -2.7    Past Surgical History:  Procedure Laterality Date   BREAST LUMPECTOMY  2001   right-benign   CATARACT EXTRACTION  2005   Gum Biopsy     Benign-2019  `  Family History  Problem Relation Age of Onset   Hypertension Mother    Heart disease Mother    Hypertension Father    Breast cancer Paternal Aunt        Age 37   Diabetes Neg Hx     Social History:  reports that she has never smoked. She has never used smokeless tobacco. She reports current alcohol use. She reports that she does not use drugs.  Allergies:  Allergies  Allergen Reactions   Ampicillin Rash    Allergies as of 05/04/2020      Reactions   Ampicillin Rash        Medication List       Accurate as of May 04, 2020  8:21 AM. If you have any questions, ask your nurse or doctor.        ALPRAZolam 0.25 MG tablet Commonly known as: XANAX Take 1 tablet (0.25 mg total) by mouth daily as needed for anxiety.   atorvastatin 20 MG tablet Commonly known as: LIPITOR TAKE ONE TABLET BY MOUTH ONCE DAILY   lamoTRIgine 25 MG tablet Commonly known as: LaMICtal 1 for 2 weeks, then 2 for 2 weeks, then 4 tablets daily   levothyroxine 112 MCG tablet Commonly known as: SYNTHROID TAKE 1 TABLET (112 MCG TOTAL) BY MOUTH DAILY BEFORE BREAKFAST.   methylphenidate 36 MG CR tablet Commonly known as: Concerta Take 2 tablets (72 mg total) by mouth daily. What changed: Another medication with the same name was removed. Continue taking this medication, and follow the directions you see here. Changed by: Elayne Snare, MD   mirtazapine 30 MG tablet Commonly known as: REMERON TAKE 2 TABLETS BY MOUTH AT BEDTIME   NAC 600 MG Caps Generic drug: Acetylcysteine Take 1,200 mg by mouth 2 (two) times daily.   OSCAL 500/200 D-3 PO Take by mouth.   PARoxetine 40 MG tablet Commonly known as: PAXIL TAKE 1 TABLET BY MOUTH TWICE A DAY   risedronate 150 MG tablet Commonly known as: ACTONEL TAKE 1 TABLET EVERY 30 DAYS WITH WATER ON EMPTY STOMACH, NOTHING BY MOUTH OR LIE DOWN FOR 30MIN   risperiDONE 2 MG tablet Commonly known as: RISPERDAL Take 1.5 tablets (3 mg total) by mouth at bedtime.   SUMAtriptan 100 MG tablet Commonly known as: IMITREX Take 100 mg by mouth daily as needed for migraine.   topiramate 100 MG tablet Commonly known as: TOPAMAX TAKE 1 TABLET BY MOUTH TWICE A DAY   VITAMIN D PO Take by mouth.        Review of Systems   HYPERCHOLESTEROLEMIA: She has been on Lipitor 20 mg for several years  LDL improved and consistently below 100 She has no other risk factors besides hypercholesterolemia  She tries to follow a low-fat diet  Lab Results   Component Value Date   CHOL 176 05/02/2020   HDL 68.70 05/02/2020   LDLCALC 89 05/02/2020   TRIG 88.0 05/02/2020   CHOLHDL 3 05/02/2020    GLUCOSE: Her fasting glucose has occasionally been high and last was 102, otherwise has been fairly consistently normal  Although A1c previously was in prediabetic range again it is now normal She has not exercised but because of her decreased appetite has lost 5 pounds  Fasting glucose below  100 No family history of diabetes  Lab Results  Component Value Date   HGBA1C 5.5 05/02/2020   HGBA1C 6.1 10/26/2019   HGBA1C 6.1 04/13/2019   Lab Results  Component Value Date   LDLCALC 89 05/02/2020   CREATININE 0.70 05/02/2020      PHYSICAL EXAM:  BP 100/60 (BP Location: Left Arm, Patient Position: Sitting, Cuff Size: Normal)    Pulse 90    Ht 5\' 3"  (1.6 m)    Wt 110 lb (49.9 kg)    LMP 11/19/2010    SpO2 98%    BMI 19.49 kg/m   Right thyroid lobe enlarged 1-1/2 times normal, slightly firm, left lobe not palpable No lymphadenopathy Biceps reflexes difficult to elicit but slightly brisk  ASSESSMENT:    HYPOTHYROIDISM:   She has longstanding primary hypothyroidism with a small goiter  Has had nonspecific symptoms of anxiety Has been consistent with 112 mcg levothyroxine but now her TSH is slightly low This may be partly related to her weight loss recently  She will now go down to 100 mcg  She is taking her generic levothyroxine consistently before breakfast TSH is normal and consistent in the normal range  Mild prediabetes: Fasting glucose is 98 compared to 102 Currently not exercising but her sugars are likely better from recent weight loss  She understand importance of a good diet and regular exercise  Osteoporosis: She has some improvement in her bone density with Actonel, previously it had decreased 15% Continue Actonel monthly as well as vitamin D  LIPIDS: Adequately controlled on Lipitor 20 mg and this will be  continued   PLAN:  As above Levothyroxine 100 mcg daily Stay on Actonel Encouraged her to start walking for exercise  Follow-up in 2 months for thyroid recheck  Mychal Decarlo 05/04/2020, 8:21 AM

## 2020-05-04 ENCOUNTER — Ambulatory Visit: Payer: BC Managed Care – PPO | Admitting: Endocrinology

## 2020-05-04 ENCOUNTER — Other Ambulatory Visit: Payer: Self-pay

## 2020-05-04 ENCOUNTER — Encounter: Payer: Self-pay | Admitting: Endocrinology

## 2020-05-04 VITALS — BP 100/60 | HR 90 | Ht 63.0 in | Wt 110.0 lb

## 2020-05-04 DIAGNOSIS — E7800 Pure hypercholesterolemia, unspecified: Secondary | ICD-10-CM

## 2020-05-04 DIAGNOSIS — R7301 Impaired fasting glucose: Secondary | ICD-10-CM

## 2020-05-04 DIAGNOSIS — E063 Autoimmune thyroiditis: Secondary | ICD-10-CM | POA: Diagnosis not present

## 2020-05-04 DIAGNOSIS — M810 Age-related osteoporosis without current pathological fracture: Secondary | ICD-10-CM

## 2020-05-04 MED ORDER — LEVOTHYROXINE SODIUM 100 MCG PO TABS
100.0000 ug | ORAL_TABLET | Freq: Every day | ORAL | 3 refills | Status: DC
Start: 2020-05-04 — End: 2020-06-14

## 2020-05-08 ENCOUNTER — Telehealth: Payer: Self-pay | Admitting: Psychiatry

## 2020-05-08 NOTE — Telephone Encounter (Signed)
Contacted pharmacy patient already had Rx on file to fill. Instructed for them to fill that if it's due.

## 2020-05-08 NOTE — Telephone Encounter (Signed)
Patient needs a refill on her concerta 36 mg to be sent to the [facility] pharmacy at [address]. She has an appt on Thursday 7/1

## 2020-05-11 ENCOUNTER — Ambulatory Visit (INDEPENDENT_AMBULATORY_CARE_PROVIDER_SITE_OTHER): Payer: BC Managed Care – PPO | Admitting: Psychiatry

## 2020-05-11 ENCOUNTER — Encounter: Payer: Self-pay | Admitting: Psychiatry

## 2020-05-11 ENCOUNTER — Other Ambulatory Visit: Payer: Self-pay

## 2020-05-11 DIAGNOSIS — F4010 Social phobia, unspecified: Secondary | ICD-10-CM

## 2020-05-11 DIAGNOSIS — F331 Major depressive disorder, recurrent, moderate: Secondary | ICD-10-CM

## 2020-05-11 DIAGNOSIS — F422 Mixed obsessional thoughts and acts: Secondary | ICD-10-CM | POA: Diagnosis not present

## 2020-05-11 DIAGNOSIS — F900 Attention-deficit hyperactivity disorder, predominantly inattentive type: Secondary | ICD-10-CM | POA: Diagnosis not present

## 2020-05-11 MED ORDER — LAMOTRIGINE 150 MG PO TABS: 150.0000 mg | ORAL_TABLET | Freq: Every day | ORAL | 1 refills | Status: DC

## 2020-05-11 MED ORDER — METHYLPHENIDATE HCL ER (OSM) 36 MG PO TBCR: 72.0000 mg | EXTENDED_RELEASE_TABLET | Freq: Every day | ORAL | 0 refills | Status: DC

## 2020-05-11 MED ORDER — RISPERIDONE 3 MG PO TABS: 3.0000 mg | ORAL_TABLET | Freq: Every day | ORAL | 0 refills | Status: DC

## 2020-05-11 NOTE — Progress Notes (Signed)
BERKLEIGH BECKLES [id number] [birth year] 59 y.o.  Subjective:   Patient ID:  Ashlee Chavez is a 59 y.o. (DOB 02/10/1961) female.  Chief Complaint:  Chief Complaint  Patient presents with  . Follow-up  . Anxiety  . Depression  . Medication Problem    jaw tremor    Depression        Associated symptoms include appetite change and headaches.  Associated symptoms include no decreased concentration, no fatigue and no suicidal ideas.  [name] presents today for follow-up of recently worse depression DT relationship loss.    When seen February 10, 2019.  She had received some benefit from Ritalin 20 mg 3 times daily but was having issues with the crash.  We switched to Concerta 72 mg every morning.  September 14, 2019.  She was having persistent bothersome OCD and was interested in further medicine changes to try to help with these symptoms because she has aggressively pursued therapy and still has significant symptoms.  She has been on multiple SSRIs and so we initiated augmentation with risperidone half milligram daily to increase to 1 mg daily. Sig improvement with risperidone.  SE low appetite with it.  Not hungry for dinner. Sleep so much better also.  Improvement is "marked".  Think better bc less obsessing time.  Not constant now.  Noticed benefit within the first week.  Less obsessing lessened depression.  But residual symptoms.  visit October 14, 2019.  She was encouraged to experiment with the dosing of the risperidone up to 2 mg or 3 mg nightly to see if she could get additional reduction in her chronic anxiety and OCD.   seen December 15, 2019.  Settled at 2 mg risperidone bc increased sleep.  Sleep is so much better.  Harder to get up in the morning.   Found out insurance won't cover Concerta anymore.  Not as good on Ritalin TID.  Not as even a response.  Terrible insurance. Therefore the only med change with switching to Focalin to see if it would be covered better.  Appt  03/09/20, switch to Concerta 72 mg is better with better mood and motivation vs Focalin.  Focus is also better.  Very poor function with Focalin and better with Concerta.   Better overall with more even response to stimulant and better appetite.  No SE.  Duration all day.  Less desperate hopeless feelings on the stimulant.  Stressful times. With increase risperidone a little better mood bump.  SE a little shakiness noticed in jaw.   Not unusual levels of anxiety.  Lost her best friend 1 year ago and still grieving.  Not much family. Alone.  Not close to B and S who live elsewhere.  Talks to God daily.  Gets to work daily.  Function is good there.  Reads and calls people.  OCD no worse with Covid. Did join a Bible study which was a huge step. Plan start lamotrigine   04/04/20 appt with the following noted: Appt moved up.  She thought she was supposed to stop risperidone when started lamotrigine.  Started having SI off the risperidone but wasn't sure if it was SE.  No risperidone 3 mg for 3 weeks.Anxiety shaking sweating crying all started right away.  Also trouble with sleep and can't eat.  Plan:Restart risperidone 1 mg now and another milligram in 2 to 3 hours if not feeling sufficient relief to get the full dose of 3 mg today.  Then resume 3 mg  risperidone each evening. Restart lamotrigine 25 mg daily for 2 weeks and follow the previous instructions on increasing it.  05/11/2020 appointment the following is noted: Marked improvement back on risperidone 3 mg.  SE shakes jaw.  Doesn't affect typing. Started lamotrigine and up to 100 mg for 2 weeks.  Feels it's helped depression. Better mood and able to laugh.  Able to get pizza for Bible study and not cry. Apptetite is not great.  Working on it. Anxiety is better not gone.  [name] and describes anxiety as mild- Moderate. 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  Anxiety symptoms include:  Excessive Worry, Obsessive Compulsive Symptoms:   Handwashing,,.OCD is good with risperidone.  Anxiety is better than last visit.   Less anxiety with the OCD in the past.  Pt reports no sleep issues, it's better 7-8 hours. Pt reports that appetite is good. Pt reports that energy is good and down slightly. Concentration is no change. Suicidal thoughts:  denied by patient.  Numerous failed psychiatric medication trials include Lexapro 60 mg a day, Zoloft 250, Paxil with weight gain,  Clomipramine,  Wellbutrin 300 with side effects, l Rexulti which helped initially,  Vraylar, Abilify between 1 and 2.5 mg and up to 15 mg with no response,  buspirone 30 mg,  Cytomel, ithium 625, pramipexole with insomnia,   Lunesta with crying spells,  topiramate, Deplin, propranolol,   Ritalin, Focalin NR, Concerta 72 helped No history of Prozac, Viibryd.  Review of Systems:  Review of Systems  Constitutional: Positive for appetite change. Negative for diaphoresis and fatigue.  Gastrointestinal: Negative for abdominal pain and nausea.  Neurological: Positive for tremors and headaches. Negative for dizziness, weakness and light-headedness.  Psychiatric/Behavioral: Positive for depression. Negative for agitation, behavioral problems, confusion, decreased concentration, hallucinations, self-injury, sleep disturbance and suicidal ideas. The patient is not hyperactive.     Medications: I have reviewed the patient's current medications.  Current Outpatient Medications  Medication Sig Dispense Refill  . Acetylcysteine (NAC) 600 MG CAPS Take 1,200 mg by mouth 2 (two) times daily.    Marland Kitchen. ALPRAZolam (XANAX) 0.25 MG tablet Take 1 tablet (0.25 mg total) by mouth daily as needed for anxiety. 30 tablet 0  . atorvastatin (LIPITOR) 20 MG tablet TAKE ONE TABLET BY MOUTH ONCE DAILY 90 tablet 1  . Calcium Carbonate-Vitamin D (OSCAL 500/200 D-3 PO) Take by mouth.    . Cholecalciferol (VITAMIN D PO) Take by mouth.    .  lamoTRIgine (LAMICTAL) 150 MG tablet Take 1 tablet (150 mg total) by mouth daily. 90 tablet 1  . levothyroxine (SYNTHROID) 100 MCG tablet Take 1 tablet (100 mcg total) by mouth daily. 30 tablet 3  . methylphenidate (CONCERTA) 36 MG PO CR tablet Take 2 tablets (72 mg total) by mouth daily. 60 tablet 0  . mirtazapine (REMERON) 30 MG tablet TAKE 2 TABLETS BY MOUTH AT BEDTIME 180 tablet 1  . PARoxetine (PAXIL) 40 MG tablet TAKE 1 TABLET BY MOUTH TWICE A DAY 180 tablet 1  . risedronate (ACTONEL) 150 MG tablet TAKE 1 TABLET EVERY 30 DAYS WITH WATER ON EMPTY STOMACH, NOTHING BY MOUTH OR LIE DOWN FOR 30MIN 3 tablet 1  . risperiDONE (RISPERDAL) 3 MG tablet Take 1 tablet (3 mg total) by mouth at bedtime. 90 tablet 0  . SUMAtriptan (IMITREX) 100 MG tablet Take 100 mg by mouth daily as needed for migraine.    . topiramate (TOPAMAX) 100  MG tablet TAKE 1 TABLET BY MOUTH TWICE A DAY 180 tablet 1  . [START ON 06/08/2020] methylphenidate (CONCERTA) 36 MG PO CR tablet Take 2 tablets (72 mg total) by mouth daily. 60 tablet 0  . [START ON 07/06/2020] methylphenidate (CONCERTA) 36 MG PO CR tablet Take 2 tablets (72 mg total) by mouth daily. 60 tablet 0   No current facility-administered medications for this visit.    Medication Side Effects:  Watch jaw tremor  Allergies:  Allergies  Allergen Reactions  . Ampicillin Rash    Past Medical History:  Diagnosis Date  . Gum lesion    Biopsy done-benign  . Hypercholesteremia   . Hypothyroid   . LGSIL (low grade squamous intraepithelial dysplasia) 2010  . Migraines   . OCD (obsessive compulsive disorder)   . Osteoporosis 01/2016   T score -2.7    Family History  Problem Relation Age of Onset  . Hypertension Mother   . Heart disease Mother   . Hypertension Father   . Breast cancer Paternal Aunt        Age 21  . Diabetes Neg Hx     Social History   Socioeconomic History  . Marital status: Single    Spouse name: Not on file  . Number of children: Not  on file  . Years of education: Not on file  . Highest education level: Not on file  Occupational History  . Not on file  Tobacco Use  . Smoking status: Never Smoker  . Smokeless tobacco: Never Used  Vaping Use  . Vaping Use: Never used  Substance and Sexual Activity  . Alcohol use: Yes    Alcohol/week: 0.0 standard drinks    Comment: wine occassionally  . Drug use: No  . Sexual activity: Never    Birth control/protection: Post-menopausal    Comment: 1st intercourse 5 yo-5 partners  Other Topics Concern  . Not on file  Social History Narrative  . Not on file   Social Determinants of Health   Financial Resource Strain:   . Difficulty of Paying Living Expenses:   Food Insecurity:   . Worried About Programme researcher, broadcasting/film/video in the Last Year:   . Barista in the Last Year:   Transportation Needs:   . Freight forwarder (Medical):   Marland Kitchen Lack of Transportation (Non-Medical):   Physical Activity:   . Days of Exercise per Week:   . Minutes of Exercise per Session:   Stress:   . Feeling of Stress :   Social Connections:   . Frequency of Communication with Friends and Family:   . Frequency of Social Gatherings with Friends and Family:   . Attends Religious Services:   . Active Member of Clubs or Organizations:   . Attends Banker Meetings:   Marland Kitchen Marital Status:   Intimate Partner Violence:   . Fear of Current or Ex-Partner:   . Emotionally Abused:   Marland Kitchen Physically Abused:   . Sexually Abused:     Past Medical History, Surgical history, Social history, and Family history were reviewed and updated as appropriate.   Please see review of systems for further details on the patient's review from today.   Objective:   Physical Exam:  LMP 11/19/2010   Physical Exam Constitutional:      General: She is not in acute distress.    Appearance: Normal appearance. She is well-developed.  Musculoskeletal:        General: No deformity.  Neurological:     Mental  Status: She is alert and oriented to person, place, and time.     Coordination: Coordination normal.  Psychiatric:        Attention and Perception: She is attentive. She does not perceive auditory hallucinations.        Mood and Affect: Mood is anxious and depressed. Affect is not labile, blunt, angry or inappropriate.        Speech: Speech normal.        Behavior: Behavior normal.        Thought Content: Thought content includes suicidal ideation. Thought content does not include homicidal ideation. Thought content does not include homicidal or suicidal plan.        Cognition and Memory: Cognition normal.        Judgment: Judgment normal.     Comments: Insight intact. No auditory or visual hallucinations. No delusions.  Slight jaw tremor. SI resolved on risperidone and dep and anx are improved markedly but not gone     Lab Review:     Component Value Date/Time   NA 136 05/02/2020 0804   K 3.8 05/02/2020 0804   CL 108 05/02/2020 0804   CO2 25 05/02/2020 0804   GLUCOSE 98 05/02/2020 0804   BUN 21 05/02/2020 0804   CREATININE 0.70 05/02/2020 0804   CALCIUM 9.5 05/02/2020 0804   PROT 6.7 05/02/2020 0804   ALBUMIN 4.5 05/02/2020 0804   AST 19 05/02/2020 0804   ALT 19 05/02/2020 0804   ALKPHOS 38 (L) 05/02/2020 0804   BILITOT 0.3 05/02/2020 0804       Component Value Date/Time   WBC 5.9 01/29/2016 0834   RBC 4.85 01/29/2016 0834   HGB 15.2 (H) 01/29/2016 0834   HCT 46.2 (H) 01/29/2016 0834   PLT 232.0 01/29/2016 0834   MCV 95.3 01/29/2016 0834   MCHC 33.0 01/29/2016 0834   RDW 14.7 01/29/2016 0834   LYMPHSABS 1.3 01/29/2016 0834   MONOABS 0.4 01/29/2016 0834   EOSABS 0.3 01/29/2016 0834   BASOSABS 0.0 01/29/2016 0834    No results found for: POCLITH, LITHIUM   No results found for: PHENYTOIN, PHENOBARB, VALPROATE, CBMZ   .res Assessment: Plan:    George was seen today for follow-up, anxiety, depression and medication problem.  Diagnoses and all orders for this  visit:  Mixed obsessional thoughts and acts -     lamoTRIgine (LAMICTAL) 150 MG tablet; Take 1 tablet (150 mg total) by mouth daily. -     risperiDONE (RISPERDAL) 3 MG tablet; Take 1 tablet (3 mg total) by mouth at bedtime.  Major depressive disorder, recurrent episode, moderate (HCC) -     lamoTRIgine (LAMICTAL) 150 MG tablet; Take 1 tablet (150 mg total) by mouth daily.  Social anxiety disorder -     risperiDONE (RISPERDAL) 3 MG tablet; Take 1 tablet (3 mg total) by mouth at bedtime.  Attention deficit hyperactivity disorder (ADHD), predominantly inattentive type -     methylphenidate (CONCERTA) 36 MG PO CR tablet; Take 2 tablets (72 mg total) by mouth daily. -     methylphenidate (CONCERTA) 36 MG PO CR tablet; Take 2 tablets (72 mg total) by mouth daily. -     methylphenidate (CONCERTA) 36 MG PO CR tablet; Take 2 tablets (72 mg total) by mouth daily.   We discussed TRD and anxiety but has partial response with the higher dosage of mirtazapine  HS. she has a long list of failed psychiatric meds.  She has  not tended to do well with higher dosages of SSRIs.  Tolerating mirtazapine and it does help her depression.  She has chronic residual anxiety which is significant. She was however markedly worse with anxiety depression and other symptoms as noted after stopping risperidone by mistake.  These symptoms were due to stopping risperidone and have nothing to do with lamotrigine. The risperidone was markedly helpful in resolving the suicidal thoughts and improving depression and anxiety.  She has tolerated the lamotrigine well.  She has been able to see herself laugh which was not the case before.  She still has significant residual depression and anxiety.  She was given reassurance that her symptoms are clearly related to stopping risperidone acutely by mistake and she will get relief very quickly.  She will call the office in 2 days if not markedly improve as expected.  ADD still better  with Concerta 72 mg versus Focalin. . . Continue high dosage paroxetine 80 mg daily. Tolerated.  She is continuing psychotherapy with Dr. Farrel Demark  Discussed potential metabolic side effects associated with atypical antipsychotics, as well as potential risk for movement side effects. Advised pt to contact office if movement side effects occur.  She agrees with the plan Counseled patient regarding potential benefits, risks, and side effects of Lamictal to include potential risk of Stevens-Johnson syndrome. Advised patient to stop taking Lamictal and contact office immediately if rash develops and to seek urgent medical attention if rash is severe and/or spreading quickly.  Follow-up in 8 weeks as scheduled  Meredith Staggers MD, DFAPA  Future Appointments  Date Time Provider Department Center  06/07/2020  5:00 PM Robley Fries, PhD [facility] None  07/10/2020  8:15 AM [facility] None  07/13/2020  8:45 AM Reather Littler, MD [facility] None  02/06/2021  3:30 PM Theresia Majors, MD [facility]    No orders of the defined types were placed in this encounter.     -------------------------------

## 2020-05-25 ENCOUNTER — Other Ambulatory Visit: Payer: Self-pay | Admitting: Psychiatry

## 2020-05-25 DIAGNOSIS — F422 Mixed obsessional thoughts and acts: Secondary | ICD-10-CM

## 2020-05-25 DIAGNOSIS — F4010 Social phobia, unspecified: Secondary | ICD-10-CM

## 2020-05-27 ENCOUNTER — Other Ambulatory Visit: Payer: Self-pay | Admitting: Endocrinology

## 2020-05-31 DIAGNOSIS — Z0000 Encounter for general adult medical examination without abnormal findings: Secondary | ICD-10-CM | POA: Diagnosis not present

## 2020-05-31 DIAGNOSIS — Z23 Encounter for immunization: Secondary | ICD-10-CM | POA: Diagnosis not present

## 2020-06-07 ENCOUNTER — Other Ambulatory Visit: Payer: Self-pay

## 2020-06-07 ENCOUNTER — Ambulatory Visit (INDEPENDENT_AMBULATORY_CARE_PROVIDER_SITE_OTHER): Payer: BC Managed Care – PPO | Admitting: Psychiatry

## 2020-06-07 DIAGNOSIS — Z634 Disappearance and death of family member: Secondary | ICD-10-CM | POA: Diagnosis not present

## 2020-06-07 DIAGNOSIS — F422 Mixed obsessional thoughts and acts: Secondary | ICD-10-CM

## 2020-06-07 DIAGNOSIS — F331 Major depressive disorder, recurrent, moderate: Secondary | ICD-10-CM

## 2020-06-07 DIAGNOSIS — F4010 Social phobia, unspecified: Secondary | ICD-10-CM

## 2020-06-07 NOTE — Progress Notes (Signed)
Psychotherapy Progress Note [facility] [name], PhD LP  Patient ID: Ashlee Chavez     MRN: [id number] Therapy format: Individual psychotherapy Date: 06/07/2020      Start: 5:05p     Stop: 6:05p     Time Spent: 45 min (remainder donated) Location: In-person   Session narrative (presenting needs, interim history, self-report of stressors and symptoms, applications of prior therapy, status changes, and interventions made in session) Per EHR, substantial improvement a/o early this month with going back on risperidone and starting lamotrigine.  Remains on alternative strategy for OCD of novel antipsychotic, stimulant, SSRI, and cyclic antidepressant along with NAC, vitamin D supplementation, thyroid treatment, and anticonvulsant for migraine prophylaxis.    Going up on lamotrigine not making a difference, will notify Dr. Jennelle Human.  Having jaw tremor and hand tremors with risperidone, enough to be a self-conscious, social anxiety trigger.  Considering accepting offer of medication for this, probably a beta blocker.  Discussed vitamin supplementation, possible rumor of B complex or biotin being helpful for tremor -- PT will investigate on her own, possibly try her own experiment.  Socially, makes it to Bible study, and got back to mass with an older couple (90s, active) when she offered to drive them if they would accompany her in her anxiety.  Three weeks habit now to meet them at church.  Does feel like it's white knuckling it to get there, but trying plenty hard.  Considering them as adoptive parents.  Has the challenge now of going to a class dinner out, feeling socially anxious.  Also continues on dating site, gets paralyzed about answering them.  Worries about her catbox situation when it comes to having anyone over -- 2nd cat requiring a 2nd litterbox, and gets very self-conscious about the smell and missing the box.  Engaged long times in cleaning up.  Can't conscience  leaving litter on floor or letting anyone come over.  Encouraged try it anyway to break social anxiety.  Endoscopy discovered ulcer.  Serendipitously kicked an aspirin habit that was probably causing rebound headaches anyway.  Seems to make sense that the aspirin caused the ulcer, too, or facilitated it.  Encouraged in the changes.  Therapeutic modalities: Cognitive Behavioral Therapy and Solution-Oriented/Positive Psychology  Mental Status/Observations:  Appearance:   Casual     Behavior:  Appropriate  Motor:  Normal  Speech/Language:   Clear and Coherent  Affect:  Appropriate  Mood:  anxious  Thought process:  normal  Thought content:    WNL  Sensory/Perceptual disturbances:    WNL  Orientation:  Fully oriented  Attention:  Good    Concentration:  Good  Memory:  WNL  Insight:    Good  Judgment:   Good  Impulse Control:  Good   Risk Assessment: Danger to Self: No Self-injurious Behavior: No Danger to Others: No Physical Aggression / Violence: No Duty to Warn: No Access to Firearms a concern: No  Assessment of progress:  progressing  Diagnosis:   ICD-10-CM   1. Mixed obsessional thoughts and acts  F42.2   2. Social anxiety disorder  F40.10   3. Major depressive disorder, recurrent episode, moderate (HCC)  F33.1   4. Bereavement  Z63.4    Plan:  . Consider personal experiment to see if NAC caused her headaches and whether it could provide further energy and sharpness to do better battle with OCD thoughts  . Consider whether to test for MTHFR issue -- consult Dr. Jennelle Human -- could  be a factor in tremor, depression, and OCD . Keep trying out social situations, as tolerable . Other recommendations/advice as may be noted above . Continue to utilize previously learned skills ad lib . Maintain medication as prescribed and work faithfully with relevant prescriber(s) if any changes are desired or seem indicated . Call the clinic on-call service, present to ER, or call 911 if any  life-threatening psychiatric crisis Return in about 1 month (around 07/08/2020) for will call. . Already scheduled visit in this office 07/26/2020.  Robley Fries, PhD [name], PhD LP Clinical Psychologist, [facility] [address], [address] [place], [place] [address] [phone]

## 2020-06-09 ENCOUNTER — Other Ambulatory Visit: Payer: Self-pay

## 2020-06-09 ENCOUNTER — Telehealth: Payer: Self-pay | Admitting: Psychiatry

## 2020-06-09 DIAGNOSIS — F331 Major depressive disorder, recurrent, moderate: Secondary | ICD-10-CM

## 2020-06-09 DIAGNOSIS — F422 Mixed obsessional thoughts and acts: Secondary | ICD-10-CM

## 2020-06-09 MED ORDER — LAMOTRIGINE 200 MG PO TABS: 200.0000 mg | ORAL_TABLET | Freq: Every day | ORAL | 1 refills | Status: DC

## 2020-06-09 NOTE — Telephone Encounter (Signed)
Pt called and said that she has taken 150 mg of the lamotrigine for 30 days and she has not seen any change at all. So dr.cottle said that if she was not seeing any results he would increase the lamotrigine to 200 mg.[facility] on [address]

## 2020-06-09 NOTE — Telephone Encounter (Signed)
Yes.  Please send Rx for lamotrigine 200 mg 1 daily, #30, 1 RF

## 2020-06-09 NOTE — Telephone Encounter (Signed)
Left message Rx sent to her pharmacy and to call back with further questions or concerns.

## 2020-06-14 ENCOUNTER — Other Ambulatory Visit: Payer: Self-pay | Admitting: Endocrinology

## 2020-06-14 ENCOUNTER — Other Ambulatory Visit: Payer: Self-pay

## 2020-06-14 DIAGNOSIS — E063 Autoimmune thyroiditis: Secondary | ICD-10-CM

## 2020-06-14 MED ORDER — LEVOTHYROXINE SODIUM 100 MCG PO TABS: 100.0000 ug | ORAL_TABLET | Freq: Every day | ORAL | 3 refills | Status: DC

## 2020-06-30 ENCOUNTER — Other Ambulatory Visit: Payer: Self-pay | Admitting: Psychiatry

## 2020-06-30 DIAGNOSIS — F331 Major depressive disorder, recurrent, moderate: Secondary | ICD-10-CM

## 2020-06-30 DIAGNOSIS — F422 Mixed obsessional thoughts and acts: Secondary | ICD-10-CM

## 2020-07-02 ENCOUNTER — Other Ambulatory Visit: Payer: Self-pay | Admitting: Psychiatry

## 2020-07-02 DIAGNOSIS — F422 Mixed obsessional thoughts and acts: Secondary | ICD-10-CM

## 2020-07-02 DIAGNOSIS — F331 Major depressive disorder, recurrent, moderate: Secondary | ICD-10-CM

## 2020-07-03 NOTE — Telephone Encounter (Signed)
Previously sent

## 2020-07-10 ENCOUNTER — Other Ambulatory Visit (INDEPENDENT_AMBULATORY_CARE_PROVIDER_SITE_OTHER): Payer: BC Managed Care – PPO

## 2020-07-10 ENCOUNTER — Telehealth: Payer: Self-pay | Admitting: Psychiatry

## 2020-07-10 ENCOUNTER — Other Ambulatory Visit: Payer: Self-pay

## 2020-07-10 DIAGNOSIS — E063 Autoimmune thyroiditis: Secondary | ICD-10-CM | POA: Diagnosis not present

## 2020-07-10 LAB — TSH: TSH: 0.23 u[IU]/mL — ABNORMAL LOW (ref 0.35–4.50)

## 2020-07-10 LAB — T4, FREE: Free T4: 1.13 ng/dL (ref 0.60–1.60)

## 2020-07-10 NOTE — Telephone Encounter (Signed)
Pt called and needs refill on Methyphen[facility]ate [facility] [address]. [phone]

## 2020-07-11 NOTE — Telephone Encounter (Signed)
According to notes patient should have Rx on file already

## 2020-07-13 ENCOUNTER — Encounter: Payer: Self-pay | Admitting: Endocrinology

## 2020-07-13 ENCOUNTER — Ambulatory Visit: Payer: BC Managed Care – PPO | Admitting: Endocrinology

## 2020-07-13 ENCOUNTER — Other Ambulatory Visit: Payer: Self-pay

## 2020-07-13 VITALS — BP 100/68 | HR 100 | Ht 63.0 in | Wt 111.4 lb

## 2020-07-13 DIAGNOSIS — E559 Vitamin D deficiency, unspecified: Secondary | ICD-10-CM | POA: Diagnosis not present

## 2020-07-13 DIAGNOSIS — E063 Autoimmune thyroiditis: Secondary | ICD-10-CM

## 2020-07-13 DIAGNOSIS — E7800 Pure hypercholesterolemia, unspecified: Secondary | ICD-10-CM | POA: Diagnosis not present

## 2020-07-13 NOTE — Patient Instructions (Addendum)
Take  6 1/2 pills a week °

## 2020-07-13 NOTE — Progress Notes (Signed)
Patient ID: Ashlee Chavez, female   DOB: [birth year], 59 y.o.   MRN: [id number]           Chief complaint: Endocrinology follow-up  History of Present Illness:    OSTEOPOROSIS:   She had a bone density done in 2017 and this showed the following T-scores: Femoral neck: -2.7, previously -1.8 Spine: -1.9  By comparison she had a decline of 8-10 % in her bone density from 2 years before and 15% result from baseline She initially had a half inch height loss from baseline  She could not afford Evista and did not want to consider Reclast because of the possible high one time cost  In 05/2016 she was started on treatment with risedronate 150 mg for her declining bone density  She is taking this once a month on empty stomach consistently No GI side effects with this  She is taking calcium supplements, currently on Os-Cal. Also regular with taking 5000 units of vitamin D with adequate vitamin D levels  BONE density: Repeat study done by gynecologist in April 21 shows T-score of the femoral neck to be -2.4 compared to -2.7  LABS:  Lab Results  Component Value Date   VD25OH 50.65 05/02/2020   VD25OH 48.34 04/13/2019    HYPOTHYROIDISM:  She had a goiter diagnosed in 1996. She had a positive peroxidase antibody and has been on thyroxine since 1996  Her TSH was high in 2006 and she had symptoms of fatigue, cold intolerance and some hair loss  Since then has been on 75 mcg of Synthroid with occasional adjustments in the dose  Since 01/2014 she had been taking the same dose of levothyroxine, 75 g daily  Her TSH was higher than usual in 11/16 and she was having complaints about depression, insomnia, fatigue and more difficulties with her anxiety  Has had chronic cold sensitivity   Her dose was increased in 7/17 and in 1/19 it was further increased to to 137 mcg because of a high TSH She was not having any unusual fatigue but her TSH was gradually increasing She was initially having  some palpitations and shakiness but no symptoms on the visit when she was on 125 mcg  Recent history:  Her levothyroxine dose was reduced progressively more recently  Prior to her visit in 6/21 she has lost weight because of anxiety and decreased appetite Also her TSH was down to 0.14  With taking 100 mcg levothyroxine now her TSH is still slightly low at 0.23 She does not think she has had as much anxiety, has no difficulty in sleep  No palpitations or shakiness currently  She does have a history of a small goiter  He has been regular with taking the levothyroxine in the morning before breakfast.  She will take her multivitamin and calcium separately  TSH is now 0.23 with free T4 1.1   Wt Readings from Last 3 Encounters:  07/13/20 111 lb 6.4 oz (50.5 kg)  05/04/20 110 lb (49.9 kg)  02/04/20 115 lb (52.2 kg)    Lab Results  Component Value Date   TSH 0.23 (L) 07/10/2020   TSH 0.14 (L) 05/02/2020   TSH 1.26 10/26/2019   FREET4 1.13 07/10/2020   FREET4 0.98 05/02/2020   FREET4 0.82 10/26/2019    OTHER active problems discussed and review of systems   Past Medical History:  Diagnosis Date  . Gum lesion    Biopsy done-benign  . Hypercholesteremia   . Hypothyroid   .  LGSIL (low grade squamous intraepithelial dysplasia) 2010  . Migraines   . OCD (obsessive compulsive disorder)   . Osteoporosis 01/2016   T score -2.7    Past Surgical History:  Procedure Laterality Date  . BREAST LUMPECTOMY  2001   right-benign  . CATARACT EXTRACTION  2005  . Gum Biopsy     Benign-2019  `  Family History  Problem Relation Age of Onset  . Hypertension Mother   . Heart disease Mother   . Hypertension Father   . Breast cancer Paternal Aunt        Age 54  . Diabetes Neg Hx     Social History:  reports that she has never smoked. She has never used smokeless tobacco. She reports current alcohol use. She reports that she does not use drugs.  Allergies:  Allergies  Allergen  Reactions  . Ampicillin Rash    Allergies as of 07/13/2020      Reactions   Ampicillin Rash      Medication List       Accurate as of July 13, 2020  8:59 AM. If you have any questions, ask your nurse or doctor.        ALPRAZolam 0.25 MG tablet Commonly known as: XANAX Take 1 tablet (0.25 mg total) by mouth daily as needed for anxiety.   atorvastatin 20 MG tablet Commonly known as: LIPITOR TAKE ONE TABLET BY MOUTH ONCE DAILY   lamoTRIgine 200 MG tablet Commonly known as: LAMICTAL TAKE 1 TABLET BY MOUTH EVERY DAY   levothyroxine 100 MCG tablet Commonly known as: SYNTHROID Take 1 tablet (100 mcg total) by mouth daily.   methylphenidate 36 MG CR tablet Commonly known as: Concerta Take 2 tablets (72 mg total) by mouth daily.   methylphenidate 36 MG CR tablet Commonly known as: Concerta Take 2 tablets (72 mg total) by mouth daily.   methylphenidate 36 MG CR tablet Commonly known as: Concerta Take 2 tablets (72 mg total) by mouth daily.   mirtazapine 30 MG tablet Commonly known as: REMERON TAKE 2 TABLETS BY MOUTH AT BEDTIME   NAC 600 MG Caps Generic drug: Acetylcysteine Take 1,200 mg by mouth 2 (two) times daily.   OSCAL 500/200 D-3 PO Take by mouth.   PARoxetine 40 MG tablet Commonly known as: PAXIL TAKE 1 TABLET BY MOUTH TWICE A DAY   risedronate 150 MG tablet Commonly known as: ACTONEL TAKE 1 TABLET EVERY 30 DAYS WITH WATER ON EMPTY STOMACH, NOTHING BY MOUTH OR LIE DOWN FOR   risperiDONE 3 MG tablet Commonly known as: RISPERDAL Take 1 tablet (3 mg total) by mouth at bedtime.   SUMAtriptan 100 MG tablet Commonly known as: IMITREX Take 100 mg by mouth daily as needed for migraine.   topiramate 100 MG tablet Commonly known as: TOPAMAX TAKE 1 TABLET BY MOUTH TWICE A DAY   VITAMIN D PO Take by mouth.        Review of Systems  Following is a copy of the previous note:  HYPERCHOLESTEROLEMIA: She has been on Lipitor 20 mg for several  years  LDL improved and consistently below 100 She has no other risk factors besides hypercholesterolemia  She tries to follow a low-fat diet  Lab Results  Component Value Date   CHOL 176 05/02/2020   HDL 68.70 05/02/2020   LDLCALC 89 05/02/2020   TRIG 88.0 05/02/2020   CHOLHDL 3 05/02/2020    GLUCOSE: Her fasting glucose has occasionally been high and last was  102, otherwise has been fairly consistently normal  Although A1c previously was in prediabetic range again it is now normal She has not exercised but because of her decreased appetite has lost 5 pounds  Fasting glucose below 100 No family history of diabetes  Lab Results  Component Value Date   HGBA1C 5.5 05/02/2020   HGBA1C 6.1 10/26/2019   HGBA1C 6.1 04/13/2019   Lab Results  Component Value Date   LDLCALC 89 05/02/2020   CREATININE 0.70 05/02/2020      PHYSICAL EXAM:  BP 100/68 (BP Location: Right Arm, Patient Position: Sitting, Cuff Size: Normal)   Pulse 100   Ht 5\' 3"  (1.6 m)   Wt 111 lb 6.4 oz (50.5 kg)   LMP 11/19/2010   BMI 19.73 kg/m     ASSESSMENT:    HYPOTHYROIDISM:   She has longstanding primary hypothyroidism with a small goiter  Has had reduction in her levothyroxine dose TSH is still slightly below normal even with reducing her down to 100 mcg  Osteoporosis: As above, consider drug holiday next year   PLAN:  She will stay on 100 mcg levothyroxine but take 6-1/2 tablets/week instead of 1 tablet daily To call if she is having any increased anxiety or unusual fatigue  01/18/2011 07/13/2020, 8:59 AM

## 2020-07-21 ENCOUNTER — Other Ambulatory Visit: Payer: Self-pay | Admitting: Psychiatry

## 2020-07-21 DIAGNOSIS — F900 Attention-deficit hyperactivity disorder, predominantly inattentive type: Secondary | ICD-10-CM

## 2020-07-21 MED ORDER — METHYLPHENIDATE HCL ER (OSM) 36 MG PO TBCR: 72.0000 mg | EXTENDED_RELEASE_TABLET | Freq: Every day | ORAL | 0 refills | Status: DC

## 2020-07-24 ENCOUNTER — Other Ambulatory Visit: Payer: Self-pay

## 2020-07-24 ENCOUNTER — Ambulatory Visit (INDEPENDENT_AMBULATORY_CARE_PROVIDER_SITE_OTHER): Payer: BC Managed Care – PPO | Admitting: Psychiatry

## 2020-07-24 DIAGNOSIS — R69 Illness, unspecified: Secondary | ICD-10-CM

## 2020-07-24 DIAGNOSIS — F4010 Social phobia, unspecified: Secondary | ICD-10-CM | POA: Diagnosis not present

## 2020-07-24 DIAGNOSIS — F422 Mixed obsessional thoughts and acts: Secondary | ICD-10-CM

## 2020-07-24 DIAGNOSIS — F908 Attention-deficit hyperactivity disorder, other type: Secondary | ICD-10-CM

## 2020-07-24 NOTE — Progress Notes (Signed)
Psychotherapy Progress Note [facility] [name], PhD LP  Patient ID: Ashlee Chavez     MRN: [id number] Therapy format: Individual psychotherapy Date: 07/24/2020      Start: 4:12     Stop: 5:00p     Time Spent: 48 min Location: In-person   Session narrative (presenting needs, interim history, self-report of stressors and symptoms, applications of prior therapy, status changes, and interventions made in session) Still struggling.  Increased lamotrigine higher, then higher, not helping, so seeing Dr. Desiree Hane.  Depression "just awful" plus she's been given a set of new duties since someone resigned, and her duties are getting divvied up.  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  Getting a lot of phone calls about things she doesn't understand yet.  Strong anxiety in response, and typical of [place] to suffer silently, worry about embarrassing herself if she asks for help, worry about embarrassing herself if she shows anxiety or is near tears.  Provided support and differentiated realistic and unrealistic thoughts about her situation.  Affirmed that her boss is supportive, and has already indicated her own tolerance for a learning curve on Ashlee Chavez's part.  Just very hard to trust and to allow that she may be received with more grace than she herself feels.  Encouraged to be willing to tell callers, as needed, that she is new to this task and may have to find answers and get back with them.  Very hard for Ashlee Chavez to accept, but recommended anyway.  Also recommended be willing to let Ashlee Chavez know that this is some of the most nerve-wracking stuff she does and that it is emotionally hard for her to step up to high-stakes, unfamiliar tasks.  Offered the possibility, declined at this point, to go into the payroll app and intentionally put in bogus numbers and look at them, just long enough to see what a terrible mistake would look like, a tactic for OCD  that I call " tease the disease".  Ashlee Chavez feels like that is too much to do at this point, but suggested, for reference, and possible inspiration later at the time of her choosing.  Assured her that doing something like this helps clarify and undo the hypnotic effect of intrusive, obsessive fears.  Further suggestion that she could "safely" by inviting Ashlee Chavez into it as a rational observer, albeit understanding that to arrange the sort of partnership would be tantamount to coming out as a neurotic, which is also strong fear of Ashlee Chavez's.  Has been driving an elderly couple to church, mutually beneficial.  Was honest with them that she has social anxiety, and it has been normalizing to reveal with them.  Compulsively gets up 5am on Sunday to be able to pick them up 2.5 hrs later.  In fact, 2.5 hrs spent every morning, work or weekend, getting acceptably ready to present herself to others.  Admittedly tired, often dreading the morning, and going to bed 8pm from exhaustion and meds.  Admits "Eating like crap", trying to keep it simple, not be bothered, conserve energy.  Does take B complex and 5000IU vitamin D per day, plus monthly vit D, so hopefully not according particular to vitamin deficiencies, but possible that protein is lacking or that carbs, preservatives, and other elements inconvenience foods or provoking inflammation, under feeding brain chemistry, or disrupting endocrine function.  Encouraged to clear up diet best she can, and we can come back to it at her discretion.  Therapeutic modalities: Cognitive Behavioral Therapy and Solution-Oriented/Positive Psychology  Mental Status/Observations:  Appearance:   Neat     Behavior:  Appropriate  Motor:  Normal  Speech/Language:   Clear and Coherent  Affect:  Appropriate and shaken  Mood:  anxious and depressed  Thought process:  normal  Thought content:    Obsessions and Rumination  Sensory/Perceptual disturbances:    WNL  Orientation:  Fully  oriented  Attention:  Good    Concentration:  Fair  Memory:  WNL  Insight:    Good  Judgment:   Good  Impulse Control:  Good   Risk Assessment: Danger to Self: No Self-injurious Behavior: No Danger to Others: No Physical Aggression / Violence: No Duty to Warn: No Access to Firearms a concern: No  Assessment of progress:  situational setback(s)  Diagnosis:   ICD-10-CM   1. Mixed obsessional thoughts and acts  F42.2   2. Social anxiety disorder  F40.10   3. 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 R/O poor nutrition  R69    Plan:  . Try to clear up diet in any way possible, with encouragement to more vegetables in any attainable way . 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 Encouraged to be willing to let business contacts no when she is momentarily out of her depth and needs to research answers for them, counting on the fact that most people will be friendly about that . Encouraged be willing to remind, as needed, her boss that the new tasks that she has are among the most anxiety provoking things she does.  Option to come out to boss as OCD as a shame attacking exercise. . Self affirm permission to be learning, no matter what . Other recommendations/advice as may be noted above  . Continue to utilize previously learned skills ad lib . Maintain medication as prescribed and work faithfully with relevant prescriber(s) if any changes are desired or seem indicated . Call the clinic on-call service, present to ER, or call 911 if any life-threatening psychiatric crisis Return 2-4 wks as able. . Already scheduled visit in this office 07/26/2020.  Robley Fries, PhD [name], PhD LP Clinical Psychologist, [facility] [address] [place], [place] [address] [phone]

## 2020-07-26 ENCOUNTER — Other Ambulatory Visit: Payer: Self-pay | Admitting: Endocrinology

## 2020-07-26 ENCOUNTER — Ambulatory Visit (INDEPENDENT_AMBULATORY_CARE_PROVIDER_SITE_OTHER): Payer: BC Managed Care – PPO | Admitting: Psychiatry

## 2020-07-26 ENCOUNTER — Other Ambulatory Visit: Payer: Self-pay | Admitting: Psychiatry

## 2020-07-26 ENCOUNTER — Encounter: Payer: Self-pay | Admitting: Psychiatry

## 2020-07-26 ENCOUNTER — Other Ambulatory Visit: Payer: Self-pay

## 2020-07-26 DIAGNOSIS — Z634 Disappearance and death of family member: Secondary | ICD-10-CM

## 2020-07-26 DIAGNOSIS — F5102 Adjustment insomnia: Secondary | ICD-10-CM | POA: Diagnosis not present

## 2020-07-26 DIAGNOSIS — F4010 Social phobia, unspecified: Secondary | ICD-10-CM

## 2020-07-26 DIAGNOSIS — F331 Major depressive disorder, recurrent, moderate: Secondary | ICD-10-CM | POA: Diagnosis not present

## 2020-07-26 DIAGNOSIS — F422 Mixed obsessional thoughts and acts: Secondary | ICD-10-CM | POA: Diagnosis not present

## 2020-07-26 DIAGNOSIS — E063 Autoimmune thyroiditis: Secondary | ICD-10-CM

## 2020-07-26 DIAGNOSIS — R251 Tremor, unspecified: Secondary | ICD-10-CM

## 2020-07-26 DIAGNOSIS — F900 Attention-deficit hyperactivity disorder, predominantly inattentive type: Secondary | ICD-10-CM

## 2020-07-26 MED ORDER — LAMOTRIGINE 150 MG PO TABS: 150.0000 mg | ORAL_TABLET | Freq: Two times a day (BID) | ORAL | 1 refills | Status: DC

## 2020-07-26 MED ORDER — PROPRANOLOL HCL 20 MG PO TABS: ORAL_TABLET | ORAL | 0 refills | Status: DC

## 2020-07-26 NOTE — Patient Instructions (Signed)
Read about TMS transcranial magnetic stimulation

## 2020-07-26 NOTE — Progress Notes (Signed)
Ashlee Chavez [id number] [birth year] 59 y.o.  Subjective:   Patient ID:  Ashlee Chavez is a 59 y.o. (DOB Nov 05, 1961) female.  Chief Complaint:  Chief Complaint  Patient presents with  . Follow-up    Medication Management  . Depression    Medication Management  . Anxiety    Depression        Associated symptoms include appetite change and headaches.  Associated symptoms include no decreased concentration, no fatigue and no suicidal ideas.  Ashlee Chavez presents today for follow-up of recently worse depression DT relationship loss.    When seen February 10, 2019.  She had received some benefit from Ritalin 20 mg 3 times daily but was having issues with the crash.  We switched to Concerta 72 mg every morning.  September 14, 2019.  She was having persistent bothersome OCD and was interested in further medicine changes to try to help with these symptoms because she has aggressively pursued therapy and still has significant symptoms.  She has been on multiple SSRIs and so we initiated augmentation with risperidone half milligram daily to increase to 1 mg daily. Sig improvement with risperidone.  SE low appetite with it.  Not hungry for dinner. Sleep so much better also.  Improvement is "marked".  Think better bc less obsessing time.  Not constant now.  Noticed benefit within the first week.  Less obsessing lessened depression.  But residual symptoms.  visit October 14, 2019.  She was encouraged to experiment with the dosing of the risperidone up to 2 mg or 3 mg nightly to see if she could get additional reduction in her chronic anxiety and OCD.   seen December 15, 2019.  Settled at 2 mg risperidone bc increased sleep.  Sleep is so much better.  Harder to get up in the morning.   Found out insurance won't cover Concerta anymore.  Not as good on Ritalin TID.  Not as even a response.  Terrible insurance. Therefore the only med change with switching to Focalin to see if it would be covered  better.  Appt 03/09/20, switch to Concerta 72 mg is better with better mood and motivation vs Focalin.  Focus is also better.  Very poor function with Focalin and better with Concerta.   Better overall with more even response to stimulant and better appetite.  No SE.  Duration all day.  Less desperate hopeless feelings on the stimulant.  Stressful times. With increase risperidone a little better mood bump.  SE a little shakiness noticed in jaw.   Not unusual levels of anxiety.  Lost her best friend 1 year ago and still grieving.  Not much family. Alone.  Not close to B and S who live elsewhere.  Talks to God daily.  Gets to work daily.  Function is good there.  Reads and calls people.  OCD no worse with Covid. Did join a Bible study which was a huge step. Plan start lamotrigine   04/04/20 appt with the following noted: Appt moved up.  She thought she was supposed to stop risperidone when started lamotrigine.  Started having SI off the risperidone but wasn't sure if it was SE.  No risperidone 3 mg for 3 weeks.Anxiety shaking sweating crying all started right away.  Also trouble with sleep and can't eat.  Plan:Restart risperidone 1 mg now and another milligram in 2 to 3 hours if not feeling sufficient relief to get the full dose of 3 mg today.  Then resume 3  mg risperidone each evening. Restart lamotrigine 25 mg daily for 2 weeks and follow the previous instructions on increasing it.  05/11/2020 appointment the following is noted: Marked improvement back on risperidone 3 mg.  SE shakes jaw.  Doesn't affect typing. Started lamotrigine and up to 100 mg for 2 weeks.  Feels it's helped depression. Better mood and able to laugh.  Able to get pizza for Bible study and not cry. Apptetite is not great.  Working on it. Anxiety is better not gone. Plan: no med changes  07/26/20 appt with the following noted; Increased lamotrigine gradually to 200 mg daily over the phone 6 weeks ago.  No improvement really for  depression. With it. CC depression.   Risperidone still helping anxiety.  [name] and describes anxiety as mild- Moderate. 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  Anxiety symptoms include: Excessive Worry, Obsessive Compulsive Symptoms:   Handwashing,,.OCD is good with risperidone.  Anxiety is better than last visit.   Less anxiety with the OCD in the past.  Pt reports no sleep issues, it's better 7-8 hours. Pt reports that appetite is good. Pt reports that energy is good and down slightly. Concentration is no change. Suicidal thoughts:  denied by patient.  Numerous failed psychiatric medication trials include Lexapro 60 mg a day, Zoloft 250, Paxil with weight gain,  Clomipramine,  Wellbutrin 300 with side effects, l Rexulti which helped initially,  Vraylar, Abilify between 1 and 2.5 mg and up to 15 mg with no response,  buspirone 30 mg,  Cytomel, ithium 625,  pramipexole with insomnia,   Lunesta with crying spells,  topiramate, Deplin, propranolol,   Ritalin, Focalin NR, Concerta 72 helped No history of Prozac, Viibryd.  Review of Systems:  Review of Systems  Constitutional: Positive for appetite change. Negative for diaphoresis and fatigue.  Cardiovascular: Negative for palpitations.  Gastrointestinal: Negative for abdominal pain and nausea.  Neurological: Positive for tremors and headaches. Negative for dizziness, weakness and light-headedness.  Psychiatric/Behavioral: Positive for depression. Negative for agitation, behavioral problems, confusion, decreased concentration, hallucinations, self-injury, sleep disturbance and suicidal ideas. The patient is not hyperactive.     Medications: I have reviewed the patient's current medications.  Current Outpatient Medications  Medication Sig Dispense Refill  . ALPRAZolam (XANAX) 0.25 MG tablet Take 1 tablet (0.25 mg total) by mouth daily as needed for anxiety. 30 tablet 0  .  atorvastatin (LIPITOR) 20 MG tablet TAKE ONE TABLET BY MOUTH ONCE DAILY 90 tablet 1  . Calcium Carbonate-Vitamin D (OSCAL 500/200 D-3 PO) Take by mouth.    . Cholecalciferol (VITAMIN D PO) Take by mouth.    . lamoTRIgine (LAMICTAL) 200 MG tablet TAKE 1 TABLET BY MOUTH EVERY DAY 30 tablet 1  . levothyroxine (SYNTHROID) 100 MCG tablet TAKE 1 TABLET BY MOUTH EVERY DAY 90 tablet 1  . [START ON 09/15/2020] methylphenidate (CONCERTA) 36 MG PO CR tablet Take 2 tablets (72 mg total) by mouth daily. 60 tablet 0  . [START ON 08/18/2020] methylphenidate (CONCERTA) 36 MG PO CR tablet Take 2 tablets (72 mg total) by mouth daily. 60 tablet 0  . methylphenidate (CONCERTA) 36 MG PO CR tablet Take 2 tablets (72 mg total) by mouth daily. 60 tablet 0  . mirtazapine (REMERON) 30 MG tablet TAKE 2 TABLETS BY MOUTH AT BEDTIME 180 tablet 1  . PARoxetine (PAXIL) 40 MG tablet TAKE 1 TABLET BY MOUTH TWICE A DAY 180 tablet 1  .  risedronate (ACTONEL) 150 MG tablet TAKE 1 TABLET EVERY 30 DAYS WITH WATER ON EMPTY STOMACH, NOTHING BY MOUTH OR LIE DOWN FOR 30MIN 3 tablet 1  . risperiDONE (RISPERDAL) 3 MG tablet Take 1 tablet (3 mg total) by mouth at bedtime. 90 tablet 0  . SUMAtriptan (IMITREX) 100 MG tablet Take 100 mg by mouth daily as needed for migraine.    . topiramate (TOPAMAX) 100 MG tablet TAKE 1 TABLET BY MOUTH TWICE A DAY 180 tablet 1   No current facility-administered medications for this visit.    Medication Side Effects:  Watch jaw tremor  Allergies:  Allergies  Allergen Reactions  . Ampicillin Rash    Past Medical History:  Diagnosis Date  . Gum lesion    Biopsy done-benign  . Hypercholesteremia   . Hypothyroid   . LGSIL (low grade squamous intraepithelial dysplasia) 2010  . Migraines   . OCD (obsessive compulsive disorder)   . Osteoporosis 01/2016   T score -2.7    Family History  Problem Relation Age of Onset  . Hypertension Mother   . Heart disease Mother   . Hypertension Father   . Breast  cancer Paternal Aunt        Age 61  . Diabetes Neg Hx     Social History   Socioeconomic History  . Marital status: Single    Spouse name: Not on file  . Number of children: Not on file  . Years of education: Not on file  . Highest education level: Not on file  Occupational History  . Not on file  Tobacco Use  . Smoking status: Never Smoker  . Smokeless tobacco: Never Used  Vaping Use  . Vaping Use: Never used  Substance and Sexual Activity  . Alcohol use: Yes    Alcohol/week: 0.0 standard drinks    Comment: wine occassionally  . Drug use: No  . Sexual activity: Never    Birth control/protection: Post-menopausal    Comment: 1st intercourse 59 yo-5 partners  Other Topics Concern  . Not on file  Social History Narrative  . Not on file   Social Determinants of Health   Financial Resource Strain:   . Difficulty of Paying Living Expenses: Not on file  Food Insecurity:   . Worried About Programme researcher, broadcasting/film/videounning Out of Food in the Last Year: Not on file  . Ran Out of Food in the Last Year: Not on file  Transportation Needs:   . Lack of Transportation (Medical): Not on file  . Lack of Transportation (Non-Medical): Not on file  Physical Activity:   . Days of Exercise per Week: Not on file  . Minutes of Exercise per Session: Not on file  Stress:   . Feeling of Stress : Not on file  Social Connections:   . Frequency of Communication with Friends and Family: Not on file  . Frequency of Social Gatherings with Friends and Family: Not on file  . Attends Religious Services: Not on file  . Active Member of Clubs or Organizations: Not on file  . Attends BankerClub or Organization Meetings: Not on file  . Marital Status: Not on file  Intimate Partner Violence:   . Fear of Current or Ex-Partner: Not on file  . Emotionally Abused: Not on file  . Physically Abused: Not on file  . Sexually Abused: Not on file    Past Medical History, Surgical history, Social history, and Family history were reviewed  and updated as appropriate.   Please see review  of systems for further details on the patient's review from today.   Objective:   Physical Exam:  LMP 11/19/2010   Physical Exam Constitutional:      General: She is not in acute distress.    Appearance: Normal appearance. She is well-developed.  Musculoskeletal:        General: No deformity.  Neurological:     Mental Status: She is alert and oriented to person, place, and time.     Coordination: Coordination normal.  Psychiatric:        Attention and Perception: She is attentive. She does not perceive auditory hallucinations.        Mood and Affect: Mood is anxious and depressed. Affect is not labile, blunt, angry or inappropriate.        Speech: Speech normal.        Behavior: Behavior normal.        Thought Content: Thought content includes suicidal ideation. Thought content does not include homicidal ideation. Thought content does not include homicidal or suicidal plan.        Cognition and Memory: Cognition normal.        Judgment: Judgment normal.     Comments: Insight intact. No auditory or visual hallucinations. No delusions.  Slight jaw tremor. SI resolved on risperidone       Lab Review:     Component Value Date/Time   NA 136 05/02/2020 0804   K 3.8 05/02/2020 0804   CL 108 05/02/2020 0804   CO2 25 05/02/2020 0804   GLUCOSE 98 05/02/2020 0804   BUN 21 05/02/2020 0804   CREATININE 0.70 05/02/2020 0804   CALCIUM 9.5 05/02/2020 0804   PROT 6.7 05/02/2020 0804   ALBUMIN 4.5 05/02/2020 0804   AST 19 05/02/2020 0804   ALT 19 05/02/2020 0804   ALKPHOS 38 (L) 05/02/2020 0804   BILITOT 0.3 05/02/2020 0804       Component Value Date/Time   WBC 5.9 01/29/2016 0834   RBC 4.85 01/29/2016 0834   HGB 15.2 (H) 01/29/2016 0834   HCT 46.2 (H) 01/29/2016 0834   PLT 232.0 01/29/2016 0834   MCV 95.3 01/29/2016 0834   MCHC 33.0 01/29/2016 0834   RDW 14.7 01/29/2016 0834   LYMPHSABS 1.3 01/29/2016 0834   MONOABS 0.4  01/29/2016 0834   EOSABS 0.3 01/29/2016 0834   BASOSABS 0.0 01/29/2016 0834    No results found for: POCLITH, LITHIUM   No results found for: PHENYTOIN, PHENOBARB, VALPROATE, CBMZ   .res Assessment: Plan:    Kortni was seen today for follow-up, depression and anxiety.  Diagnoses and all orders for this visit:  Major depressive disorder, recurrent episode, moderate (HCC)  Mixed obsessional thoughts and acts  Social anxiety disorder  Insomnia due to psychological stress  Attention deficit hyperactivity disorder (ADHD), predominantly inattentive type  Bereavement   We discussed TRD and anxiety but has partial response with the higher dosage of mirtazapine 60mg  HS. she has a long list of failed psychiatric meds.  She has not tended to do well with higher dosages of SSRIs.  Tolerating mirtazapine and it does help her depression.  She has chronic residual anxiety which is significant. She was however markedly worse with anxiety depression and other symptoms as noted after stopping risperidone by mistake.  These symptoms were due to stopping risperidone and The risperidone was markedly helpful in resolving the suicidal thoughts and improving depression and anxiety.   She has tolerated the lamotrigine well.  No sig benefit with lamotrigine.  Wants to try further increase Increase lamotrigine to 300 mg daily.  She was given reassurance that her symptoms are clearly related to stopping risperidone acutely by mistake and she will get relief very quickly.  She will call the office in 2 days if not markedly improve as expected.  ADD still better with Concerta 72 mg versus Focalin. . . Continue high dosage paroxetine 80 mg daily. Tolerated.  Trial propranolol prn tremor  She is continuing psychotherapy with Dr. Farrel Demark  Discussed potential metabolic side effects associated with atypical antipsychotics, as well as potential risk for movement side effects. Advised pt to contact office if  movement side effects occur.  She agrees with the plan Counseled patient regarding potential benefits, risks, and side effects of Lamictal to include potential risk of Stevens-Johnson syndrome. Advised patient to stop taking Lamictal and contact office immediately if rash develops and to seek urgent medical attention if rash is severe and/or spreading quickly.  TMS was discussed again.  Option Latuda.  Follow-up in 6 weeks  Meredith Staggers MD, DFAPA  Future Appointments  Date Time Provider Department Center  08/21/2020  4:00 PM Robley Fries, PhD [facility] None  11/14/2020  8:15 AM [facility] None  11/16/2020  8:15 AM Reather Littler, MD [facility] None  02/06/2021  3:30 PM Theresia Majors, MD [facility]    No orders of the defined types were placed in this encounter.     -------------------------------

## 2020-07-26 NOTE — Telephone Encounter (Signed)
Apt this afternoon 

## 2020-07-27 ENCOUNTER — Other Ambulatory Visit: Payer: Self-pay | Admitting: Psychiatry

## 2020-07-27 DIAGNOSIS — F331 Major depressive disorder, recurrent, moderate: Secondary | ICD-10-CM

## 2020-07-27 DIAGNOSIS — F422 Mixed obsessional thoughts and acts: Secondary | ICD-10-CM

## 2020-08-04 ENCOUNTER — Other Ambulatory Visit: Payer: Self-pay | Admitting: Psychiatry

## 2020-08-04 DIAGNOSIS — F4010 Social phobia, unspecified: Secondary | ICD-10-CM

## 2020-08-04 DIAGNOSIS — F422 Mixed obsessional thoughts and acts: Secondary | ICD-10-CM

## 2020-08-12 ENCOUNTER — Other Ambulatory Visit: Payer: Self-pay | Admitting: Endocrinology

## 2020-08-19 ENCOUNTER — Other Ambulatory Visit: Payer: Self-pay | Admitting: Psychiatry

## 2020-08-19 DIAGNOSIS — F331 Major depressive disorder, recurrent, moderate: Secondary | ICD-10-CM

## 2020-08-19 DIAGNOSIS — F422 Mixed obsessional thoughts and acts: Secondary | ICD-10-CM

## 2020-08-21 ENCOUNTER — Ambulatory Visit (INDEPENDENT_AMBULATORY_CARE_PROVIDER_SITE_OTHER): Payer: BC Managed Care – PPO | Admitting: Psychiatry

## 2020-08-21 ENCOUNTER — Other Ambulatory Visit: Payer: Self-pay

## 2020-08-21 DIAGNOSIS — F422 Mixed obsessional thoughts and acts: Secondary | ICD-10-CM | POA: Diagnosis not present

## 2020-08-21 DIAGNOSIS — F4010 Social phobia, unspecified: Secondary | ICD-10-CM

## 2020-08-21 DIAGNOSIS — F331 Major depressive disorder, recurrent, moderate: Secondary | ICD-10-CM

## 2020-08-21 DIAGNOSIS — F5102 Adjustment insomnia: Secondary | ICD-10-CM

## 2020-08-21 DIAGNOSIS — E638 Other specified nutritional deficiencies: Secondary | ICD-10-CM

## 2020-08-21 DIAGNOSIS — F908 Attention-deficit hyperactivity disorder, other type: Secondary | ICD-10-CM

## 2020-08-21 NOTE — Telephone Encounter (Signed)
Please review

## 2020-08-21 NOTE — Progress Notes (Signed)
Psychotherapy Progress Note [facility] [name], PhD LP  Patient ID: Ashlee Chavez     MRN: [id number] Therapy format: Individual psychotherapy Date: 08/21/2020      Start: 4:10p     Stop: 5:00p     Time Spent: 50 min Location: In-person   Session narrative (presenting needs, interim history, self-report of stressors and symptoms, applications of prior therapy, status changes, and interventions made in session) Feels the medication "cocktail" really isn't doing anything effective.  3 weeks out since last med change, says Dr. Jennelle Human chose to increase cyclic antidepressant, atypical antipsychotic, and now anticonvulsant.  Med check notes indicate the understanding that her work is "boring" to her and that she wants to increase anticonvulsant, and explanation that her worrisome symptoms have to do with accidental withdrawal from risperidone (which was noticeably helpful, and Ashlee Chavez has a history of feeling personally and acutely responsible for perceived failures in medication when it turns out dosing changed, or got misunderstood, and she felt she risked embarrassment or being wrong if she spoke up.  Ashlee Chavez tells me that work recently became much more intimidating, actually, and she has substantial, persistent anxiety about being able to think well enough and the risk of screwing up, either in work or in self-care.  Discussed the importance of being crystal clear with her psychiatrist about what she actually worries about, lest she create misunderstandings that can take both of them down a primrose path.  Med notes also state she was to call within 2 days if she did not experience marked improvement; as it has been almost 4 weeks now, reminded her of that and encouraged her to follow through.  Bad diet, feels chronically bloated.  C/o constipation despite keeping water handy.  Has had carb-loaded snacks, like [employer], will have a half-dozen box in place of a meal.  Bagel  after church.   Overslept yesterday when she mis-set alarm "and the world didn't end".  Got up with her old couple, managed to take them to second-chance mass.  Went to [employer] later, go to grocery at more of a peak time, not obsess about laundry.  Affirmed good recovery in attitude after setback and shame attack.  Has been taking risperidone and mirtazapine too early -- c. 6:30pm -- and crashing 7pm but still sleeping until 5am wake and still feeling sleepy at wake time.  Just last night took them closer to bedtime, more like 7:30pm, did not crash, made it to 9pm.  Waking a bit earlier to have enough time to get ready for the day -- 2.5 hrs prep time, including lunch, litter box cleanup (twice a day).  Continues bothered by cat going outside litter box, and she maintains three boxes.  One cat is 14, possible dementia.  Morning routine takes a lot of time from rising to ready to go, largely for cat cleanup, in much detail.  Morning shower has been involving longer times washing hair, possibly 10 minutes.  Discussed possibility of using the happy birthday song (maybe 4 times through) to time it as a way to mark time and pace herself.   At work, plenty of anxiety about new duties and lots of distraction from coworkers asking her for help and attention.  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  Ashlee Chavez has encouraged undivided attention time, easier said than done.  Therapeutic modalities: Cognitive Behavioral Therapy and Solution-Oriented/Positive Psychology  Mental Status/Observations:  Appearance:   Casual and Neat     Behavior:  Appropriate  Motor:  Normal  Speech/Language:   Clear and Coherent  Affect:  Appropriate  Mood:  anxious and depressed  Thought process:  normal  Thought content:    Obsessions  Sensory/Perceptual disturbances:    WNL  Orientation:  Fully  oriented  Attention:  Good    Concentration:  Fair  Memory:  WNL  Insight:    Fair  Judgment:   Good  Impulse Control:  Good   Risk Assessment: Danger to Self: No Self-injurious Behavior: No Danger to Others: No Physical Aggression / Violence: No Duty to Warn: No Access to Firearms a concern: No  Assessment of progress:  stabilized  Diagnosis:   ICD-10-CM   1. Major depressive disorder, recurrent episode, moderate (HCC)  F33.1   2. Mixed obsessional thoughts and acts  F42.2   3. Social anxiety disorder  F40.10   4. Insomnia due to psychological stress  F51.02   5. Attention deficit hyperactivity disorder (ADHD), other type - secondary to anxiety and depression  F90.8   6. Imbalanced nutrition  E63.8    Plan:  . Report back to Dr. Jennelle Human, as agreed but forgotten.  If uncertain about any med strategies being too dulling, do broach the subject, as keeping it to herself only "protects" obsession and worry.  If questions, ask them, don't suppress them. . Notice and challenge self-blame for medication not "working" -- both expectations and administration could be off, as well as observations and experience mistranslated between doctor and patient due to her tendencies to self-suppress and assume . Try to reduce carbs, improve vegetable and protein content in diet . Ask boss as needed, and state clearly where needed, undivided attention time on the job and restricted access to interruption . Other recommendations/advice as may be noted above . Continue to utilize previously learned skills ad lib . Maintain medication as prescribed and work faithfully with relevant prescriber(s) if any changes are desired or seem indicated . Call the clinic on-call service, present to ER, or call 911 if any life-threatening psychiatric crisis Return up to 1 month. . Already scheduled visit in this office 09/14/2020.  Robley Fries, PhD [name], PhD LP Clinical Psychologist, [facility]  Group [facility] [address], [address] [place], [place] [address] [phone]

## 2020-08-22 ENCOUNTER — Telehealth: Payer: Self-pay

## 2020-08-22 NOTE — Telephone Encounter (Signed)
Patient was to call back after 3 weeks after her increase of Lamotrigine 300 mg, she said there is still no change. She mentioned another medication, she thought maybe Latuda but not sure.   Uses [facility] [address]

## 2020-08-22 NOTE — Telephone Encounter (Signed)
Since lamotrigine has not been helpful have her reduce the dose to 200 mg daily and thereafter reduce by 50 mg/week until she discontinues it.  If she wants to pick up samples she can start a trial of Latuda 20 mg each evening taken with food.  The typical dosage range for depression is 20 to 40 mg and sometimes a little higher.

## 2020-08-23 ENCOUNTER — Other Ambulatory Visit: Payer: Self-pay

## 2020-08-23 DIAGNOSIS — F422 Mixed obsessional thoughts and acts: Secondary | ICD-10-CM

## 2020-08-23 DIAGNOSIS — F331 Major depressive disorder, recurrent, moderate: Secondary | ICD-10-CM

## 2020-08-23 MED ORDER — LAMOTRIGINE 25 MG PO TABS: ORAL_TABLET | ORAL | 0 refills | Status: DC

## 2020-08-23 NOTE — Telephone Encounter (Signed)
LM to call back to discuss changes

## 2020-08-23 NOTE — Telephone Encounter (Signed)
Patient called back and is aware of the taper of Lamotrigine. Rx sent for 25 mg tablets once she gets down to lower doses. She has 300 mg at home. She will come by this afternoon to pick up Latuda 20 mg samples.

## 2020-09-11 ENCOUNTER — Other Ambulatory Visit: Payer: Self-pay | Admitting: Psychiatry

## 2020-09-12 ENCOUNTER — Other Ambulatory Visit: Payer: Self-pay

## 2020-09-12 ENCOUNTER — Ambulatory Visit (INDEPENDENT_AMBULATORY_CARE_PROVIDER_SITE_OTHER): Payer: BC Managed Care – PPO | Admitting: Psychiatry

## 2020-09-12 DIAGNOSIS — F422 Mixed obsessional thoughts and acts: Secondary | ICD-10-CM

## 2020-09-12 DIAGNOSIS — F4321 Adjustment disorder with depressed mood: Secondary | ICD-10-CM | POA: Diagnosis not present

## 2020-09-12 DIAGNOSIS — F331 Major depressive disorder, recurrent, moderate: Secondary | ICD-10-CM | POA: Diagnosis not present

## 2020-09-12 DIAGNOSIS — R69 Illness, unspecified: Secondary | ICD-10-CM

## 2020-09-12 DIAGNOSIS — F4010 Social phobia, unspecified: Secondary | ICD-10-CM | POA: Diagnosis not present

## 2020-09-12 NOTE — Progress Notes (Signed)
Psychotherapy Progress Note [facility] [name], PhD LP  Patient ID: Ashlee Chavez     MRN: [id number] Therapy format: Individual psychotherapy Date: 09/12/2020      Start: 8:14a     Stop: 9:02a     Time Spent: 48 min Location: In-person   Session narrative (presenting needs, interim history, self-report of stressors and symptoms, applications of prior therapy, status changes, and interventions made in session) Once again in the midst of a medication change, subject which has seemed wearying to Ashlee Chavez in the past.  Hopeful, however, and cites no ill effects at present, from beginning Ashlee Chavez for 1 week and being off Lamictal.    Discussed again feeling stuck for months and depression now and the complex of physiological, experiential, and psychosocial reasons she is set up to remain in depression unless she makes more significant changes.  In addition to ongoing, existential concerns about aging and being long-term single, with roots in a decades old history of social anxiety and OCD-infused worry about how she comes across to others, the death of her very close friend Ashlee Chavez and the onset of the COVID pandemic in rapid succession 2 years ago dealt something of a death blow to the efforts she had underway to develop her social network and activities, and there frankly is no one in her world these days that she considers a close confidante, including brother and sister in [place].  The death of her father a few years ago was the first half of a 1-2 punch to her sense of having some when she could reach out to about anxiety, depression, worries, etc.    More recently, the challenges of retooling and retraining in her job were initially panic-provoking, but she does see now, a couple of months in, gradually learning and, despite the impression that her boss does not have a clue, that her boss is very understanding, as promised, of the learning curve.  She is coaching her  self that she did not always know the things she used to know right off about, and that she can see herself learning as she goes.  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  The possibility of a midweek day off she only finds annoying, for the fear that she would only obsess about going back to work the next day and not truly be free and her thoughts and feelings.  Also notes that evenings and weekends she is not really able to enjoy, either, because the thought of the need to clean always comes up, and it does take a lot of mental energy to get through the tasks, although being careful, and the intrusive thoughts that she has of things that need doing around the house but she has neither the time nor the energy nor the money.  Perceived financial stress is another issue, as Ashlee Chavez has for several years now artificially restrained her income by contributing heavily to retirement so that she can qualify for subsidized health insurance under the [employer].  A number of things do not seem to be affordable at that level of cash flow, and she faces the ego-dystonic scenario of servicing credit card debt now after car repairs and major medical procedures this year.  Physiologically, Ashlee Chavez continues to sleep about 10 hours a day, putting herself to bed near 7 PM  and up at 5 AM for an extensive morning routine that involves painstakingly getting herself but together for the day after changing litter in 4 cat boxes.  In the evening, she admits retreating to bed to try not to be conscious and susceptible to either worrying or regretting the day.  She says she is plenty rested, but just cannot wait to get to the couch and retreat in the evening.  She also continues to admit a poorly nutritious, carb-having pattern of eating, which likely contributes.  Given her thin frame, it is possible  that Ashlee Chavez is undereating, also, at that brain chemistry suffers for it.  She is also would admittedly very sedentary for about 2 years now, when her former program of going to the gym was functioning as an effective antidepressant.  Socially, the highlight of her week is picking up an older couple for church and sharing lunch with them.  It is truly a positive in her week, with the 2 of them serving somewhat the role of positive foster parents in the absence of her own.  Therapeutic modalities: Cognitive Behavioral Therapy and Solution-Oriented/Positive Psychology  Mental Status/Observations:  Appearance:   Neat     Behavior:  Appropriate  Motor:  Normal  Speech/Language:   Clear and Coherent  Affect:  Appropriate  Mood:  anxious and depressed  Thought process:  normal  Thought content:    Obsessions  Sensory/Perceptual disturbances:    WNL  Orientation:  Fully oriented  Attention:  Good    Concentration:  Good  Memory:  grossly intact  Insight:    Fair  Judgment:   Good  Impulse Control:  Fair   Risk Assessment: Danger to Self: No Self-injurious Behavior: No Danger to Others: No Physical Aggression / Violence: No Duty to Warn: No Access to Firearms a concern: No  Assessment of progress:  stabilized  Diagnosis:   ICD-10-CM   1. Major depressive disorder, recurrent episode, moderate (HCC)  F33.1   2. Mixed obsessional thoughts and acts  F42.2   3. Social anxiety disorder  F40.10   4. 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 R/O poor nutrition  R69    Plan:   To combat obsessions about housecleaning and repair, make a master wish list of things she would like to do the house.  Decidedly approach this as a form of organizing her thoughts, not a way of indicting herself for things not done or rehearsing helplessness over things that are not available right now.  Now that her finicky cat has become reliable about using one of the 4 litter boxes, try restricting access to one of  them (e.g., master bath) to see if she will reliably use the other 3.  If this works, she can take further steps to redirect the cat and dismantle the overgrown burden of cleaning up 4 boxes a day.  Agrees to make an effort to stave off bedtime until closer to 9 PM  Agrees to go the other way and get herself to walk near her home rather than settle for being sedentary  Not agreed but encouraged to diversify her diet, get more fiber, vegetables, and fruits where possible and reduce carbs  Look into establishing an HSA as another tax shelter and a way to pay healthcare costs at less personal cost than credit cards.  Other recommendations/advice as may be noted above  Continue to utilize previously learned skills ad lib  Maintain medication as prescribed and work faithfully with relevant  prescriber(s) if any changes are desired or seem indicated  Call the clinic on-call service, present to ER, or call 911 if any life-threatening psychiatric crisis Return in about 1 month (around 10/12/2020) for time as available, available earlier @ PT's need.  Already scheduled visit in this office 09/14/2020.  Robley Fries, PhD [name], PhD LP Clinical Psychologist, [facility] [address] [place], [place] [address] [phone]

## 2020-09-14 ENCOUNTER — Ambulatory Visit (INDEPENDENT_AMBULATORY_CARE_PROVIDER_SITE_OTHER): Payer: BC Managed Care – PPO | Admitting: Psychiatry

## 2020-09-14 ENCOUNTER — Other Ambulatory Visit: Payer: Self-pay

## 2020-09-14 ENCOUNTER — Encounter: Payer: Self-pay | Admitting: Psychiatry

## 2020-09-14 DIAGNOSIS — F5102 Adjustment insomnia: Secondary | ICD-10-CM | POA: Diagnosis not present

## 2020-09-14 DIAGNOSIS — F422 Mixed obsessional thoughts and acts: Secondary | ICD-10-CM | POA: Diagnosis not present

## 2020-09-14 DIAGNOSIS — R251 Tremor, unspecified: Secondary | ICD-10-CM

## 2020-09-14 DIAGNOSIS — F4010 Social phobia, unspecified: Secondary | ICD-10-CM

## 2020-09-14 DIAGNOSIS — F331 Major depressive disorder, recurrent, moderate: Secondary | ICD-10-CM

## 2020-09-14 DIAGNOSIS — F900 Attention-deficit hyperactivity disorder, predominantly inattentive type: Secondary | ICD-10-CM

## 2020-09-14 MED ORDER — LURASIDONE HCL 40 MG PO TABS: 40.0000 mg | ORAL_TABLET | Freq: Every day | ORAL | 1 refills | Status: DC

## 2020-09-14 MED ORDER — METHYLPHENIDATE HCL ER (OSM) 36 MG PO TBCR: 72.0000 mg | EXTENDED_RELEASE_TABLET | Freq: Every day | ORAL | 0 refills | Status: DC

## 2020-09-14 MED ORDER — PROPRANOLOL HCL 20 MG PO TABS: ORAL_TABLET | ORAL | 0 refills | Status: DC

## 2020-09-14 NOTE — Patient Instructions (Addendum)
Increase Latuda to 40 mg daily. reduce risperidone to 1.5 (1/2 tablet daily)  mg HS  If not better by Thanksgiving call and we'll stop risperidone.

## 2020-09-14 NOTE — Progress Notes (Signed)
Ashlee Chavez [id number] [birth year] 59 y.o.  Subjective:   Patient ID:  Ashlee Chavez is a 59 y.o. (DOB 03-May-1961) female.  Chief Complaint:  Chief Complaint  Patient presents with  . Follow-up  . Depression  . Anxiety    Depression        Associated symptoms include appetite change and headaches.  Associated symptoms include no decreased concentration, no fatigue and no suicidal ideas.  Ashlee Chavez presents today for follow-up of recently worse depression DT relationship loss.    When seen February 10, 2019.  She had received some benefit from Ritalin 20 mg 3 times daily but was having issues with the crash.  We switched to Concerta 72 mg every morning.  September 14, 2019.  She was having persistent bothersome OCD and was interested in further medicine changes to try to help with these symptoms because she has aggressively pursued therapy and still has significant symptoms.  She has been on multiple SSRIs and so we initiated augmentation with risperidone half milligram daily to increase to 1 mg daily. Sig improvement with risperidone.  SE low appetite with it.  Not hungry for dinner. Sleep so much better also.  Improvement is "marked".  Think better bc less obsessing time.  Not constant now.  Noticed benefit within the first week.  Less obsessing lessened depression.  But residual symptoms.  visit October 14, 2019.  She was encouraged to experiment with the dosing of the risperidone up to 2 mg or 3 mg nightly to see if she could get additional reduction in her chronic anxiety and OCD.   seen December 15, 2019.  Settled at 2 mg risperidone bc increased sleep.  Sleep is so much better.  Harder to get up in the morning.   Found out insurance won't cover Concerta anymore.  Not as good on Ritalin TID.  Not as even a response.  Terrible insurance. Therefore the only med change with switching to Focalin to see if it would be covered better.  Appt 03/09/20, switch to Concerta 72 mg is  better with better mood and motivation vs Focalin.  Focus is also better.  Very poor function with Focalin and better with Concerta.   Better overall with more even response to stimulant and better appetite.  No SE.  Duration all day.  Less desperate hopeless feelings on the stimulant.  Stressful times. With increase risperidone a little better mood bump.  SE a little shakiness noticed in jaw.   Not unusual levels of anxiety.  Lost her best friend 1 year ago and still grieving.  Not much family. Alone.  Not close to B and S who live elsewhere.  Talks to God daily.  Gets to work daily.  Function is good there.  Reads and calls people.  OCD no worse with Covid. Did join a Bible study which was a huge step. Plan start lamotrigine   04/04/20 appt with the following noted: Appt moved up.  She thought she was supposed to stop risperidone when started lamotrigine.  Started having SI off the risperidone but wasn't sure if it was SE.  No risperidone 3 mg for 3 weeks.Anxiety shaking sweating crying all started right away.  Also trouble with sleep and can't eat.  Plan:Restart risperidone 1 mg now and another milligram in 2 to 3 hours if not feeling sufficient relief to get the full dose of 3 mg today.  Then resume 3 mg risperidone each evening. Restart lamotrigine 25 mg daily for  2 weeks and follow the previous instructions on increasing it.  05/11/2020 appointment the following is noted: Marked improvement back on risperidone 3 mg.  SE shakes jaw.  Doesn't affect typing. Started lamotrigine and up to 100 mg for 2 weeks.  Feels it's helped depression. Better mood and able to laugh.  Able to get pizza for Bible study and not cry. Apptetite is not great.  Working on it. Anxiety is better not gone. Plan: no med changes  07/26/20 appt with the following noted; Increased lamotrigine gradually to 200 mg daily over the phone 6 weeks ago.  No improvement really for depression. With it. CC depression.   Risperidone  still helping anxiety. Plan: Increase lamotrigine to 300 mg daily..  09/14/2020 appointment with following noted: Started taper of lamotrigine DT NR. Started latuda 20 mg daily for 10 days.  Tolerated fine.  No effects so far.. Main problem is still depression.  Anxiety is not too bad.  No energy, motivation, sleeeping a lot.  No sig SI. In past started risperidone for anxiety and depression.  [name] and describes anxiety as mild- Moderate. 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  Anxiety symptoms include: Excessive Worry, Obsessive Compulsive Symptoms:   Handwashing,,.OCD is good with risperidone.  Anxiety is better than last visit.   Less anxiety with the OCD in the past.  Pt reports no sleep issues, it's better 7-8 hours. Pt reports that appetite is good. Pt reports that energy is good and down slightly. Concentration is no change. Suicidal thoughts:  denied by patient.  Numerous failed psychiatric medication trials include Lexapro 60 mg a day, Zoloft 250, Paxil with weight gain,  Clomipramine,  Wellbutrin 300 with side effects, l Rexulti which helped initially,  Vraylar, Abilify between 1 and 2.5 mg and up to 15 mg with no response,  Lamotrigine 300 NR buspirone 30 mg,  Cytomel, ithium 625,  pramipexole with insomnia,   Lunesta with crying spells,  topiramate, Deplin, propranolol,   Ritalin, Focalin NR, Concerta 72 helped No history of Prozac, Viibryd.  Review of Systems:  Review of Systems  Constitutional: Positive for appetite change. Negative for diaphoresis and fatigue.  Cardiovascular: Negative for palpitations.  Gastrointestinal: Negative for abdominal pain and nausea.  Neurological: Positive for tremors and headaches. Negative for dizziness, weakness and light-headedness.  Psychiatric/Behavioral: Positive for depression. Negative for agitation, behavioral problems, confusion, decreased concentration, hallucinations,  self-injury, sleep disturbance and suicidal ideas. The patient is not hyperactive.     Medications: I have reviewed the patient's current medications.  Current Outpatient Medications  Medication Sig Dispense Refill  . ALPRAZolam (XANAX) 0.25 MG tablet Take 1 tablet (0.25 mg total) by mouth daily as needed for anxiety. 30 tablet 0  . atorvastatin (LIPITOR) 20 MG tablet TAKE ONE TABLET BY MOUTH ONCE DAILY 90 tablet 1  . Calcium Carbonate-Vitamin D (OSCAL 500/200 D-3 PO) Take by mouth.    . Cholecalciferol (VITAMIN D PO) Take by mouth.    . levothyroxine (SYNTHROID) 100 MCG tablet TAKE 1 TABLET BY MOUTH EVERY DAY 90 tablet 1  . [START ON 11/09/2020] methylphenidate (CONCERTA) 36 MG PO CR tablet Take 2 tablets (72 mg total) by mouth daily. 60 tablet 0  . [START ON 10/12/2020] methylphenidate (CONCERTA) 36 MG PO CR tablet Take 2 tablets (72 mg total) by mouth daily. 60 tablet 0  . methylphenidate (CONCERTA) 36 MG PO CR tablet Take 2 tablets (72 mg total)  by mouth daily. 60 tablet 0  . mirtazapine (REMERON) 30 MG tablet TAKE 2 TABLETS BY MOUTH AT BEDTIME 180 tablet 1  . PARoxetine (PAXIL) 40 MG tablet TAKE 1 TABLET BY MOUTH TWICE A DAY 180 tablet 1  . propranolol (INDERAL) 20 MG tablet 1-2 tablets twice daily as needed for tremor 100 tablet 0  . risedronate (ACTONEL) 150 MG tablet TAKE 1 TABLET EVERY 30 DAYS WITH WATER ON EMPTY STOMACH, NOTHING BY MOUTH OR LIE DOWN FOR 30MIN 3 tablet 1  . risperiDONE (RISPERDAL) 3 MG tablet TAKE 1 TABLET (3 MG TOTAL) BY MOUTH AT BEDTIME. 90 tablet 0  . SUMAtriptan (IMITREX) 100 MG tablet Take 100 mg by mouth daily as needed for migraine.    . topiramate (TOPAMAX) 100 MG tablet TAKE 1 TABLET BY MOUTH TWICE A DAY 180 tablet 0  . lurasidone (LATUDA) 40 MG TABS tablet Take 1 tablet (40 mg total) by mouth daily with breakfast. 30 tablet 1   No current facility-administered medications for this visit.    Medication Side Effects:  Watch jaw tremor  Allergies:   Allergies  Allergen Reactions  . Ampicillin Rash    Past Medical History:  Diagnosis Date  . Gum lesion    Biopsy done-benign  . Hypercholesteremia   . Hypothyroid   . LGSIL (low grade squamous intraepithelial dysplasia) 2010  . Migraines   . OCD (obsessive compulsive disorder)   . Osteoporosis 01/2016   T score -2.7    Family History  Problem Relation Age of Onset  . Hypertension Mother   . Heart disease Mother   . Hypertension Father   . Breast cancer Paternal Aunt        Age 61  . Diabetes Neg Hx     Social History   Socioeconomic History  . Marital status: Single    Spouse name: Not on file  . Number of children: Not on file  . Years of education: Not on file  . Highest education level: Not on file  Occupational History  . Not on file  Tobacco Use  . Smoking status: Never Smoker  . Smokeless tobacco: Never Used  Vaping Use  . Vaping Use: Never used  Substance and Sexual Activity  . Alcohol use: Yes    Alcohol/week: 0.0 standard drinks    Comment: wine occassionally  . Drug use: No  . Sexual activity: Never    Birth control/protection: Post-menopausal    Comment: 1st intercourse 59 yo-5 partners  Other Topics Concern  . Not on file  Social History Narrative  . Not on file   Social Determinants of Health   Financial Resource Strain:   . Difficulty of Paying Living Expenses: Not on file  Food Insecurity:   . Worried About Programme researcher, broadcasting/film/videounning Out of Food in the Last Year: Not on file  . Ran Out of Food in the Last Year: Not on file  Transportation Needs:   . Lack of Transportation (Medical): Not on file  . Lack of Transportation (Non-Medical): Not on file  Physical Activity:   . Days of Exercise per Week: Not on file  . Minutes of Exercise per Session: Not on file  Stress:   . Feeling of Stress : Not on file  Social Connections:   . Frequency of Communication with Friends and Family: Not on file  . Frequency of Social Gatherings with Friends and Family:  Not on file  . Attends Religious Services: Not on file  . Active Member of  Clubs or Organizations: Not on file  . Attends Banker Meetings: Not on file  . Marital Status: Not on file  Intimate Partner Violence:   . Fear of Current or Ex-Partner: Not on file  . Emotionally Abused: Not on file  . Physically Abused: Not on file  . Sexually Abused: Not on file    Past Medical History, Surgical history, Social history, and Family history were reviewed and updated as appropriate.   Please see review of systems for further details on the patient's review from today.   Objective:   Physical Exam:  LMP 11/19/2010   Physical Exam Constitutional:      General: She is not in acute distress.    Appearance: Normal appearance. She is well-developed.  Musculoskeletal:        General: No deformity.  Neurological:     Mental Status: She is alert and oriented to person, place, and time.     Coordination: Coordination normal.  Psychiatric:        Attention and Perception: She is attentive. She does not perceive auditory hallucinations.        Mood and Affect: Mood is anxious and depressed. Affect is not labile, blunt, angry or inappropriate.        Speech: Speech normal.        Behavior: Behavior normal.        Thought Content: Thought content includes suicidal ideation. Thought content does not include homicidal ideation. Thought content does not include homicidal or suicidal plan.        Cognition and Memory: Cognition normal.        Judgment: Judgment normal.     Comments: Insight intact. No auditory or visual hallucinations. No delusions.  Slight jaw tremor. SI resolved on risperidone       Lab Review:     Component Value Date/Time   NA 136 05/02/2020 0804   K 3.8 05/02/2020 0804   CL 108 05/02/2020 0804   CO2 25 05/02/2020 0804   GLUCOSE 98 05/02/2020 0804   BUN 21 05/02/2020 0804   CREATININE 0.70 05/02/2020 0804   CALCIUM 9.5 05/02/2020 0804   PROT 6.7  05/02/2020 0804   ALBUMIN 4.5 05/02/2020 0804   AST 19 05/02/2020 0804   ALT 19 05/02/2020 0804   ALKPHOS 38 (L) 05/02/2020 0804   BILITOT 0.3 05/02/2020 0804       Component Value Date/Time   WBC 5.9 01/29/2016 0834   RBC 4.85 01/29/2016 0834   HGB 15.2 (H) 01/29/2016 0834   HCT 46.2 (H) 01/29/2016 0834   PLT 232.0 01/29/2016 0834   MCV 95.3 01/29/2016 0834   MCHC 33.0 01/29/2016 0834   RDW 14.7 01/29/2016 0834   LYMPHSABS 1.3 01/29/2016 0834   MONOABS 0.4 01/29/2016 0834   EOSABS 0.3 01/29/2016 0834   BASOSABS 0.0 01/29/2016 0834    No results found for: POCLITH, LITHIUM   No results found for: PHENYTOIN, PHENOBARB, VALPROATE, CBMZ   .res Assessment: Plan:    Lolamae was seen today for follow-up, depression and anxiety.  Diagnoses and all orders for this visit:  Major depressive disorder, recurrent episode, moderate (HCC)  Mixed obsessional thoughts and acts  Social anxiety disorder  Insomnia due to psychological stress  Attention deficit hyperactivity disorder (ADHD), predominantly inattentive type -     methylphenidate (CONCERTA) 36 MG PO CR tablet; Take 2 tablets (72 mg total) by mouth daily. -     methylphenidate (CONCERTA) 36 MG PO CR tablet; Take  2 tablets (72 mg total) by mouth daily. -     methylphenidate (CONCERTA) 36 MG PO CR tablet; Take 2 tablets (72 mg total) by mouth daily.  Tremor of face and hands -     propranolol (INDERAL) 20 MG tablet; 1-2 tablets twice daily as needed for tremor  Other orders -     lurasidone (LATUDA) 40 MG TABS tablet; Take 1 tablet (40 mg total) by mouth daily with breakfast.   We discussed TRD and anxiety but has partial response with the higher dosage of mirtazapine  HS. she has a long list of failed psychiatric meds.  She has not tended to do well with higher dosages of SSRIs.  Tolerating mirtazapine and it does help her depression.  She has chronic residual anxiety which is significant. She was however markedly  worse with anxiety depression and other symptoms as noted after stopping risperidone by mistake.  These symptoms were due to stopping risperidone and The risperidone was markedly helpful in resolving the suicidal thoughts and improving depression and anxiety.   She has tolerated the lamotrigine well.  No sig benefit with lamotrigine.  Wants to try further increase Increase Latuda to 40 mg daily. Disc potential DDI with risperidone reducing effectiveness of  Latuda so reduc risperidone to 1.5 mg HS  If not better by Thanksgiving call and we'll stop risperidone and hope anxiety does not relapse like in the past..  She was given reassurance that her symptoms are clearly related to stopping risperidone acutely by mistake and she will get relief very quickly.  She will call the office in 2 days if not markedly improve as expected.  ADD still better with Concerta 72 mg versus Focalin. . . Continue high dosage paroxetine 80 mg daily. Tolerated.  Trial propranolol prn tremor  She is continuing psychotherapy with Dr. Farrel Demark  Discussed potential metabolic side effects associated with atypical antipsychotics, as well as potential risk for movement side effects. Advised pt to contact office if movement side effects occur.  She agrees with the plan  TMS was discussed again.   Follow-up in 6 weeks  Meredith Staggers MD, DFAPA  Future Appointments  Date Time Provider Department Center  10/20/2020  4:00 PM Robley Fries, PhD [facility] None  11/14/2020  8:15 AM [facility] None  11/16/2020  8:15 AM Reather Littler, MD [facility] None  02/06/2021  3:30 PM Theresia Majors, MD [facility]    No orders of the defined types were placed in this encounter.     -------------------------------

## 2020-09-22 ENCOUNTER — Telehealth: Payer: Self-pay

## 2020-09-22 NOTE — Telephone Encounter (Signed)
Prior authorization submitted and approved for Latuda 40 mg effective 09/18/2020-09/17/2021 with [employer] of [place] Ref# [id number]

## 2020-10-04 ENCOUNTER — Other Ambulatory Visit: Payer: Self-pay | Admitting: Psychiatry

## 2020-10-04 DIAGNOSIS — R251 Tremor, unspecified: Secondary | ICD-10-CM

## 2020-10-09 ENCOUNTER — Telehealth: Payer: Self-pay

## 2020-10-09 NOTE — Telephone Encounter (Signed)
Left detailed message with information. Will contact patient tomorrow to make sure she received.

## 2020-10-09 NOTE — Telephone Encounter (Signed)
At the last appointment we increased Latuda to 40 mg daily and reduced risperidone to half tablet daily.  The goal was to use Latuda and eliminate risperidone.  However since her anxiety has worsened with the reduction in risperidone, I agree with her and she should take the full dose of risperidone as well as continue the Latuda 40 mg daily.  The [name] is intended to help depression.

## 2020-10-09 NOTE — Telephone Encounter (Signed)
Patient left a voicemail on Friday while we were out of office for the Holiday that she is not any better. She feels the cutting in the 1/2 of Risperidone has really increased her anxiety. She was instructed to call back if not any better but concerned that taking away the Risperidone will really exacerbate her anxiety.   Please advise

## 2020-10-20 ENCOUNTER — Ambulatory Visit (INDEPENDENT_AMBULATORY_CARE_PROVIDER_SITE_OTHER): Payer: BC Managed Care – PPO | Admitting: Psychiatry

## 2020-10-20 ENCOUNTER — Ambulatory Visit: Payer: BC Managed Care – PPO | Admitting: Psychiatry

## 2020-10-20 ENCOUNTER — Other Ambulatory Visit: Payer: Self-pay

## 2020-10-20 DIAGNOSIS — F422 Mixed obsessional thoughts and acts: Secondary | ICD-10-CM

## 2020-10-20 DIAGNOSIS — F4321 Adjustment disorder with depressed mood: Secondary | ICD-10-CM

## 2020-10-20 DIAGNOSIS — F331 Major depressive disorder, recurrent, moderate: Secondary | ICD-10-CM

## 2020-10-20 DIAGNOSIS — R69 Illness, unspecified: Secondary | ICD-10-CM

## 2020-10-20 DIAGNOSIS — F4010 Social phobia, unspecified: Secondary | ICD-10-CM

## 2020-10-20 DIAGNOSIS — Z9189 Other specified personal risk factors, not elsewhere classified: Secondary | ICD-10-CM

## 2020-10-20 NOTE — Progress Notes (Signed)
Psychotherapy Progress Note [facility] [name], PhD LP  Patient ID: Ashlee Chavez     MRN: [id number] Therapy format: Individual psychotherapy Date: 10/20/2020      Start: 2:15p     Stop: 3:00p     Time Spent: 45 min Location: In-person   Session narrative (presenting needs, interim history, self-report of stressors and symptoms, applications of prior therapy, status changes, and interventions made in session) Continues to make medication changes, none seem to work, anxiety gets out of control.  On a combination of Risperidone and Latuda now, just holding in order to stop irritating her system with change fter change.  Feeling weary of medication changes.  Discussed the possibility of choosing a "good enough" regimen with Dr. Jennelle Human and agreeing to hold for period of time, just to get off her nerve about making changes.  Refreshed the understanding that no medication choice is going to make all the difference if health habits and life issues are involved, and that she has a long-established -- and sometimes well-earned -- hypervigilance about pharmaceutical treatment which will, itself, spark anxiety, worry, and demoralization.  Agrees, will represent herself.  Encouraged she be willing to inquire about earlier med appointment if waiting till mid-January would make her feel stifled, misunderstood, hopeless, etc., not assume she should keep herself out of the way, she's being silly, other people need the time more, etc.  Also offered the good possibility that medications are not serving that well because her supply of relevant neurotransmitter is too low.  Discussed the possibility of 5-HTP supplementation and recommended she take it up with Dr. Jennelle Human.  Holiday plans tend to be uncertain or absent, and continuing bereavement for parents, including father who used to be her ready reassurance by phone.  No news or indication of any interest in dating, which would still be  fraught with anxiety were the pandemic not making any meeting, let alone touching, highly anxiety-provoking.  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    Work remains draining with the new procedures that are honestly coming to her but still carry more intense responsibility and threat of being "the problem" if she makes a mistake.  Boss still supportive, no indication of fulfilling fears of being judged, scolded, or given orders.  No new indication of being terribly distracted by coworkers, though the steps taken for this and the roles she is in may still be lonely-making in themselves.  Addressed self-consciousness and social anxiety -- at the heart of what she considers "OCD" -- and framed personal experiments she could try to break out further from paralyzing worry and darkening thoughts about her responsibility -- and "failure" -- to "contribute".  Intrusive worries continue about data-entry mistakes at work -- encouraged to make temporary, intentional mistakes as a way of taking charge with intrusive worry, showing it and herself both the actual appearance of an error and her clear ability to recognize and repair.  Added option of informing her boss, who knows she is in treatment, that she will be trying something offbeat like this, so in case a "fake" error slips through or shows up temporarily, this is where it comes from.  Agrees with the idea, not sure she feels bold enough to try it.  Therapeutic modalities: Cognitive Behavioral  Therapy, Solution-Oriented/Positive Psychology and Psycho-education/Bibliotherapy  Mental Status/Observations:  Appearance:   Neat     Behavior:  Appropriate  Motor:  Normal, tense  Speech/Language:   Clear and Coherent  Affect:  Appropriate and uncomfortable at  times  Mood:  anxious and depressed  Thought process:  normal  Thought content:    Obsessions and intrusive fears  Sensory/Perceptual disturbances:    WNL  Orientation:  Fully oriented  Attention:  Good    Concentration:  Good  Memory:  WNL  Insight:    Good  Judgment:   Good  Impulse Control:  Good   Risk Assessment: Danger to Self: No Self-injurious Behavior: No Danger to Others: No Physical Aggression / Violence: No Duty to Warn: No Access to Firearms a concern: No  Assessment of progress:  stabilized  Diagnosis:   ICD-10-CM   1. Major depressive disorder, recurrent episode, moderate (HCC)  F33.1   2. Mixed obsessional thoughts and acts  F42.2   3. Social anxiety disorder  F40.10   4. R/O poor nutrition  R69   5. 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 At high risk for loneliness  Z91.89    Plan:  . Behavioral experiments with OCD/perfectionism: o "Contribute" at Bible study next Thursday by finding one "wow" to express or one question to ask, nothing more involved, or planned, or "responsible" than that.  "Bite-sized" contribution and full permission to stay quiet the rest of the night if she likes. o Consider trying out intentional, temporary data-entry mistakes at work.  Try 10 times and see what happens.  Share with boss as desired, on the assurance that boss already knows she has anxiety and is in treatment. . Pending approval by Dr. Jennelle Human, consider 5-HTP supplementation to supply serotonin production . Other recommendations/advice as may be noted above . Continue to utilize previously learned skills ad lib . Maintain medication as prescribed and work faithfully with relevant prescriber(s) if any changes are desired or seem indicated . Call the clinic on-call service, present to ER, or call 911 if any life-threatening psychiatric crisis . Prefer to work Q 2 wks in this state, but unable to afford the time off or cost.  Agreed she can call in if she has results of an  experiment to share Return for time as available. . Already scheduled visit in this office 11/23/2020.  Robley Fries, PhD [name], PhD LP Clinical Psychologist, [facility] [address] [place], [place] [address] [phone]

## 2020-10-26 ENCOUNTER — Other Ambulatory Visit: Payer: Self-pay | Admitting: Psychiatry

## 2020-10-26 DIAGNOSIS — R251 Tremor, unspecified: Secondary | ICD-10-CM

## 2020-10-27 ENCOUNTER — Other Ambulatory Visit: Payer: Self-pay | Admitting: Psychiatry

## 2020-10-27 DIAGNOSIS — F422 Mixed obsessional thoughts and acts: Secondary | ICD-10-CM

## 2020-10-27 DIAGNOSIS — F4010 Social phobia, unspecified: Secondary | ICD-10-CM

## 2020-10-31 DIAGNOSIS — R519 Headache, unspecified: Secondary | ICD-10-CM | POA: Diagnosis not present

## 2020-10-31 DIAGNOSIS — G43019 Migraine without aura, intractable, without status migrainosus: Secondary | ICD-10-CM | POA: Diagnosis not present

## 2020-10-31 DIAGNOSIS — G43719 Chronic migraine without aura, intractable, without status migrainosus: Secondary | ICD-10-CM | POA: Diagnosis not present

## 2020-11-14 ENCOUNTER — Ambulatory Visit: Payer: BC Managed Care – PPO | Admitting: Psychiatry

## 2020-11-14 ENCOUNTER — Other Ambulatory Visit (INDEPENDENT_AMBULATORY_CARE_PROVIDER_SITE_OTHER): Payer: BC Managed Care – PPO

## 2020-11-14 ENCOUNTER — Other Ambulatory Visit: Payer: Self-pay

## 2020-11-14 DIAGNOSIS — E559 Vitamin D deficiency, unspecified: Secondary | ICD-10-CM | POA: Diagnosis not present

## 2020-11-14 DIAGNOSIS — E063 Autoimmune thyroiditis: Secondary | ICD-10-CM

## 2020-11-14 DIAGNOSIS — E7800 Pure hypercholesterolemia, unspecified: Secondary | ICD-10-CM | POA: Diagnosis not present

## 2020-11-14 LAB — T4, FREE: Free T4: 0.96 ng/dL (ref 0.60–1.60)

## 2020-11-14 LAB — COMPREHENSIVE METABOLIC PANEL
ALT: 19 U/L (ref 0–35)
AST: 19 U/L (ref 0–37)
Albumin: 4.6 g/dL (ref 3.5–5.2)
Alkaline Phosphatase: 41 U/L (ref 39–117)
BUN: 15 mg/dL (ref 6–23)
CO2: 27 mEq/L (ref 19–32)
Calcium: 9.8 mg/dL (ref 8.4–10.5)
Chloride: 104 mEq/L (ref 96–112)
Creatinine, Ser: 0.82 mg/dL (ref 0.40–1.20)
GFR: 77.39 mL/min (ref >60.00)
Glucose, Bld: 96 mg/dL (ref 70–99)
Potassium: 4 mEq/L (ref 3.5–5.1)
Sodium: 139 mEq/L (ref 135–145)
Total Bilirubin: 0.4 mg/dL (ref 0.2–1.2)
Total Protein: 6.9 g/dL (ref 6.0–8.3)

## 2020-11-14 LAB — TSH: TSH: 0.65 u[IU]/mL (ref 0.35–4.50)

## 2020-11-14 LAB — LIPID PANEL
Cholesterol: 187 mg/dL (ref 0–200)
HDL: 74.8 mg/dL (ref >39.00)
LDL Cholesterol: 92 mg/dL (ref 0–99)
NonHDL: 112.52
Total CHOL/HDL Ratio: 3
Triglycerides: 103 mg/dL (ref 0.0–149.0)
VLDL: 20.6 mg/dL (ref 0.0–40.0)

## 2020-11-14 LAB — VITAMIN D 25 HYDROXY (VIT D DEFICIENCY, FRACTURES): VITD: 47.92 ng/mL (ref 30.00–100.00)

## 2020-11-16 ENCOUNTER — Ambulatory Visit: Payer: BC Managed Care – PPO | Admitting: Endocrinology

## 2020-11-16 ENCOUNTER — Other Ambulatory Visit: Payer: Self-pay

## 2020-11-16 VITALS — BP 110/78 | HR 82 | Ht 63.0 in | Wt 107.4 lb

## 2020-11-16 DIAGNOSIS — E7800 Pure hypercholesterolemia, unspecified: Secondary | ICD-10-CM | POA: Diagnosis not present

## 2020-11-16 DIAGNOSIS — E063 Autoimmune thyroiditis: Secondary | ICD-10-CM

## 2020-11-16 DIAGNOSIS — R7301 Impaired fasting glucose: Secondary | ICD-10-CM | POA: Diagnosis not present

## 2020-11-16 DIAGNOSIS — M810 Age-related osteoporosis without current pathological fracture: Secondary | ICD-10-CM

## 2020-11-16 NOTE — Progress Notes (Addendum)
Patient ID: Ashlee Chavez, female   DOB: 1961/03/05, 60 y.o.   MRN: [id number]           Chief complaint: Endocrinology follow-up  History of Present Illness:    OSTEOPOROSIS:   She had a bone density done in 2017 and this showed the following T-scores: Femoral neck: -2.7, previously -1.8 Spine: -1.9  By comparison she had a decline of 8-10 % in her bone density from 2 years before and 15% result from baseline She initially had a half inch height loss from baseline  She could not afford Evista and did not want to consider Reclast because of the possible high one time cost  In 05/2016 she was started on treatment with risedronate 150 mg for her declining bone density  She is taking this once a month on empty stomach regularly every month. This does not cause any heartburn or abdominal distress  She is taking calcium supplements, regular on Os-Cal. Also regular with taking 5000 units of vitamin D with consistently normal vitamin D levels  BONE density: Repeat study done by gynecologist in April 2021 shows T-score of the femoral neck to be -2.4 compared to -2.7  LABS:  Lab Results  Component Value Date   VD25OH 47.92 11/14/2020   VD25OH 50.65 05/02/2020    HYPOTHYROIDISM:  She had a goiter diagnosed in 1996. She had a positive peroxidase antibody and has been on thyroxine since 1996  Her TSH was high in 2006 and she had symptoms of fatigue, cold intolerance and some hair loss  Since then has been on 75 mcg of Synthroid with occasional adjustments in the dose  Since 01/2014 she had been taking the same dose of levothyroxine, 75 g daily  Her TSH was higher than usual in 11/16 and she was having complaints about depression, insomnia, fatigue and more difficulties with her anxiety  Has had chronic cold sensitivity   Her dose was increased in 7/17 and in 1/19 it was further increased to to 137 mcg because of a high TSH She was not having any unusual fatigue but her TSH was  gradually increasing She was initially having some palpitations and shakiness but no symptoms on the visit when she was on 125 mcg  Recent history:  Her levothyroxine dose has been reduced progressively more recently  Prior to her visit in 6/21 she has lost weight because of anxiety and decreased appetite Also her TSH was down to 0.14  Since her last visit has been taking 100 mcg levothyroxine 6-1/2 tablets a week with the half tablet on Sundays Now her TSH is back to normal finally at 0.65  She feels good overall except for issues with depression and low motivation Also not eating as much and losing a little weight  She does have a history of a small goiter  He has been regular with taking the levothyroxine in the morning before breakfast.  She will take her multivitamin and calcium separately   Wt Readings from Last 3 Encounters:  11/16/20 107 lb 6.4 oz (48.7 kg)  07/13/20 111 lb 6.4 oz (50.5 kg)  05/04/20 110 lb (49.9 kg)    Lab Results  Component Value Date   TSH 0.65 11/14/2020   TSH 0.23 (L) 07/10/2020   TSH 0.14 (L) 05/02/2020   FREET4 0.96 11/14/2020   FREET4 1.13 07/10/2020   FREET4 0.98 05/02/2020    OTHER active problems discussed and review of systems   Past Medical History:  Diagnosis Date  Gum lesion    Biopsy done-benign   Hypercholesteremia    Hypothyroid    LGSIL (low grade squamous intraepithelial dysplasia) 2010   Migraines    OCD (obsessive compulsive disorder)    Osteoporosis 01/2016   T score -2.7    Past Surgical History:  Procedure Laterality Date   BREAST LUMPECTOMY  2001   right-benign   CATARACT EXTRACTION  2005   Gum Biopsy     Benign-2019  `  Family History  Problem Relation Age of Onset   Hypertension Mother    Heart disease Mother    Hypertension Father    Breast cancer Paternal Aunt        Age 58   Diabetes Neg Hx     Social History:  reports that she has never smoked. She has never used smokeless tobacco. She  reports current alcohol use. She reports that she does not use drugs.  Allergies:  Allergies  Allergen Reactions   Ampicillin Rash    Allergies as of 11/16/2020       Reactions   Ampicillin Rash        Medication List        Accurate as of November 16, 2020  8:13 AM. If you have any questions, ask your nurse or doctor.          ALPRAZolam 0.25 MG tablet Commonly known as: XANAX Take 1 tablet (0.25 mg total) by mouth daily as needed for anxiety.   atorvastatin 20 MG tablet Commonly known as: LIPITOR TAKE ONE TABLET BY MOUTH ONCE DAILY   levothyroxine 100 MCG tablet Commonly known as: SYNTHROID TAKE 1 TABLET BY MOUTH EVERY DAY   lurasidone 40 MG Tabs tablet Commonly known as: Latuda Take 1 tablet (40 mg total) by mouth daily with breakfast.   methylphenidate 36 MG CR tablet Commonly known as: Concerta Take 2 tablets (72 mg total) by mouth daily.   methylphenidate 36 MG CR tablet Commonly known as: Concerta Take 2 tablets (72 mg total) by mouth daily.   methylphenidate 36 MG CR tablet Commonly known as: Concerta Take 2 tablets (72 mg total) by mouth daily.   mirtazapine 30 MG tablet Commonly known as: REMERON TAKE 2 TABLETS BY MOUTH AT BEDTIME   OSCAL 500/200 D-3 PO Take by mouth.   PARoxetine 40 MG tablet Commonly known as: PAXIL TAKE 1 TABLET BY MOUTH TWICE A DAY   propranolol 20 MG tablet Commonly known as: INDERAL TAKE 1 TO 2 TABLETS TWICE A DAY AS NEEDED FOR TREMOR   risedronate 150 MG tablet Commonly known as: ACTONEL TAKE 1 TABLET EVERY 30 DAYS WITH WATER ON EMPTY STOMACH, NOTHING BY MOUTH OR LIE DOWN FOR 30MIN   risperiDONE 3 MG tablet Commonly known as: RISPERDAL TAKE 1 TABLET (3 MG TOTAL) BY MOUTH AT BEDTIME.   SUMAtriptan 100 MG tablet Commonly known as: IMITREX Take 100 mg by mouth daily as needed for migraine.   topiramate 100 MG tablet Commonly known as: TOPAMAX TAKE 1 TABLET BY MOUTH TWICE A DAY   VITAMIN D PO Take by  mouth.         Review of Systems    HYPERCHOLESTEROLEMIA: She has been on Lipitor 20 mg for several years  LDL improved and consistently below 100 She has no other risk factors besides hypercholesterolemia  She tries to follow a low-fat diet  Lab Results  Component Value Date   CHOL 187 11/14/2020   HDL 74.80 11/14/2020   LDLCALC 92 11/14/2020  TRIG 103.0 11/14/2020   CHOLHDL 3 11/14/2020    GLUCOSE: Her fasting glucose has occasionally been high and last was 96, otherwise has been fairly consistently normal  Although A1c previously was in prediabetic range it is last normal   No family history of diabetes  Lab Results  Component Value Date   HGBA1C 5.5 05/02/2020   HGBA1C 6.1 10/26/2019   HGBA1C 6.1 04/13/2019   Lab Results  Component Value Date   LDLCALC 92 11/14/2020   CREATININE 0.82 11/14/2020      PHYSICAL EXAM:  BP 110/78   Pulse 82   Ht 5\' 3"  (1.6 m)   Wt 107 lb 6.4 oz (48.7 kg)   LMP 11/19/2010   SpO2 94%   BMI 19.03 kg/m   Right thyroid lobe is enlarged 1-1/2 times normal, firm and slightly irregular.  Left lobe not clearly palpable  ASSESSMENT:    HYPOTHYROIDISM:   She has longstanding primary hypothyroidism with a small goiter, likely from Hashimoto's thyroiditis  She is now taking 6-1/2 tablets a week of the 100 mcg levothyroxine and subjectively doing well Her TSH is back to normal   Osteoporosis: Bone density was improved as of 4/21 She is on Actonel and vitamin D level is therapeutic  Lipids: Well-controlled  Fasting glucose normal   PLAN:  She will stay on 100 mcg levothyroxine, 6-1/2 tablets/week Follow-up in 6 months To call if she is having any increased anxiety or unusual fatigue  She will continue to work with her psychiatrist/psychologist for depression which is causing some weight loss  Will discuss stopping risedronate for a year on her next visit  5/21 11/16/2020, 8:13 AM

## 2020-11-18 ENCOUNTER — Other Ambulatory Visit: Payer: Self-pay | Admitting: Psychiatry

## 2020-11-23 ENCOUNTER — Other Ambulatory Visit: Payer: Self-pay

## 2020-11-23 ENCOUNTER — Encounter: Payer: Self-pay | Admitting: Psychiatry

## 2020-11-23 ENCOUNTER — Ambulatory Visit (INDEPENDENT_AMBULATORY_CARE_PROVIDER_SITE_OTHER): Payer: BC Managed Care – PPO | Admitting: Psychiatry

## 2020-11-23 DIAGNOSIS — F331 Major depressive disorder, recurrent, moderate: Secondary | ICD-10-CM

## 2020-11-23 DIAGNOSIS — F422 Mixed obsessional thoughts and acts: Secondary | ICD-10-CM | POA: Diagnosis not present

## 2020-11-23 DIAGNOSIS — F5102 Adjustment insomnia: Secondary | ICD-10-CM

## 2020-11-23 DIAGNOSIS — F4010 Social phobia, unspecified: Secondary | ICD-10-CM

## 2020-11-23 DIAGNOSIS — F900 Attention-deficit hyperactivity disorder, predominantly inattentive type: Secondary | ICD-10-CM

## 2020-11-23 MED ORDER — METHYLPHENIDATE HCL ER (OSM) 36 MG PO TBCR: 72.0000 mg | EXTENDED_RELEASE_TABLET | Freq: Every day | ORAL | 0 refills | Status: DC

## 2020-11-23 MED ORDER — METHYLPHENIDATE HCL ER (OSM) 36 MG PO TBCR
72.0000 mg | EXTENDED_RELEASE_TABLET | Freq: Every day | ORAL | 0 refills | Status: DC
Start: 2021-01-18 — End: 2021-03-09

## 2020-11-23 MED ORDER — LURASIDONE HCL 60 MG PO TABS
60.0000 mg | ORAL_TABLET | Freq: Every day | ORAL | 1 refills | Status: DC
Start: 2020-11-23 — End: 2021-02-19

## 2020-11-23 NOTE — Patient Instructions (Addendum)
5-HTP supplement 200 mg daily  Increase Latuda to 60 mg  for 3 weeks, If no benefit and no SE increase to 80 mg daily with food.

## 2020-11-23 NOTE — Progress Notes (Signed)
Ashlee Chavez [id number] 09/29/1961 60 y.o.  Subjective:   Patient ID:  Ashlee Chavez is a 60 y.o. (DOB 05/09/1961) female.  Chief Complaint:  Chief Complaint  Patient presents with  . Follow-up  . Depression  . Anxiety    Depression        Associated symptoms include appetite change and headaches.  Associated symptoms include no decreased concentration, no fatigue and no suicidal ideas.  Ashlee Chavez presents today for follow-up of recently worse depression DT relationship loss.    When seen February 10, 2019.  She had received some benefit from Ritalin 20 mg 3 times daily but was having issues with the crash.  We switched to Concerta 72 mg every morning.  September 14, 2019.  She was having persistent bothersome OCD and was interested in further medicine changes to try to help with these symptoms because she has aggressively pursued therapy and still has significant symptoms.  She has been on multiple SSRIs and so we initiated augmentation with risperidone half milligram daily to increase to 1 mg daily. Sig improvement with risperidone.  SE low appetite with it.  Not hungry for dinner. Sleep so much better also.  Improvement is "marked".  Think better bc less obsessing time.  Not constant now.  Noticed benefit within the first week.  Less obsessing lessened depression.  But residual symptoms.  visit October 14, 2019.  She was encouraged to experiment with the dosing of the risperidone up to 2 mg or 3 mg nightly to see if she could get additional reduction in her chronic anxiety and OCD.   seen December 15, 2019.  Settled at 2 mg risperidone bc increased sleep.  Sleep is so much better.  Harder to get up in the morning.   Found out insurance won't cover Concerta anymore.  Not as good on Ritalin TID.  Not as even a response.  Terrible insurance. Therefore the only med change with switching to Focalin to see if it would be covered better.  Appt 03/09/20, switch to Concerta 72 mg is  better with better mood and motivation vs Focalin.  Focus is also better.  Very poor function with Focalin and better with Concerta.   Better overall with more even response to stimulant and better appetite.  No SE.  Duration all day.  Less desperate hopeless feelings on the stimulant.  Stressful times. With increase risperidone a little better mood bump.  SE a little shakiness noticed in jaw.   Not unusual levels of anxiety.  Lost her best friend 1 year ago and still grieving.  Not much family. Alone.  Not close to B and S who live elsewhere.  Talks to God daily.  Gets to work daily.  Function is good there.  Reads and calls people.  OCD no worse with Covid. Did join a Bible study which was a huge step. Plan start lamotrigine   04/04/20 appt with the following noted: Appt moved up.  She thought she was supposed to stop risperidone when started lamotrigine.  Started having SI off the risperidone but wasn't sure if it was SE.  No risperidone 3 mg for 3 weeks.Anxiety shaking sweating crying all started right away.  Also trouble with sleep and can't eat.  Plan:Restart risperidone 1 mg now and another milligram in 2 to 3 hours if not feeling sufficient relief to get the full dose of 3 mg today.  Then resume 3 mg risperidone each evening. Restart lamotrigine 25 mg daily for  2 weeks and follow the previous instructions on increasing it.  05/11/2020 appointment the following is noted: Marked improvement back on risperidone 3 mg.  SE shakes jaw.  Doesn't affect typing. Started lamotrigine and up to 100 mg for 2 weeks.  Feels it's helped depression. Better mood and able to laugh.  Able to get pizza for Bible study and not cry. Apptetite is not great.  Working on it. Anxiety is better not gone. Plan: no med changes  07/26/20 appt with the following noted; Increased lamotrigine gradually to 200 mg daily over the phone 6 weeks ago.  No improvement really for depression. With it. CC depression.   Risperidone  still helping anxiety. Plan: Increase lamotrigine to 300 mg daily..  09/14/2020 appointment with following noted: Started taper of lamotrigine DT NR. Started latuda 20 mg daily for 10 days.  Tolerated fine.  No effects so far.. Main problem is still depression.  Anxiety is not too bad.  No energy, motivation, sleeeping a lot.  No sig SI. In past started risperidone for anxiety and depression.  Increase Latuda to 40 mg daily. Disc potential DDI with risperidone reducing effectiveness of  Latuda so reduc risperidone to 1.5 mg HS  10/09/2020 phone call from patient: Patient left a voicemail on Friday while we were out of office for the Holiday that she is not any better. She feels the cutting in the 1/2 of Risperidone has really increased her anxiety. She was instructed to call back if not any better but concerned that taking away the Risperidone will really exacerbate her anxiety.  MD response:  At the last appointment we increased Latuda to 40 mg daily and reduced risperidone to half tablet daily.  The goal was to use Latuda and eliminate risperidone.  However since her anxiety has worsened with the reduction in risperidone, I agree with her and she should take the full dose of risperidone as well as continue the Latuda 40 mg daily.  The [name] is intended to help depression.  11/23/2020 appointment with the following noted: Propranolol only once daily. No better with higher Latuda.  No SE either. No change in appetite which is poor.  Still struggling with depression and OCD is a little worse with changes at work. Off lamotrigine.  [name] and describes anxiety as mild- Moderate. 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  Anxiety symptoms include: Excessive Worry, Obsessive Compulsive Symptoms:   Handwashing,,.  Pt reports no sleep issues, it's better 7-8 hours. Pt reports that appetite is good. Pt reports that energy is good and down  slightly. Concentration is no change. Suicidal thoughts:  denied by patient.  Numerous failed psychiatric medication trials include Lexapro 60 mg a day, Zoloft 250, Paxil with weight gain,  Clomipramine,  Wellbutrin 300 with side effects, l Rexulti which helped initially,  Vraylar, Abilify between 1 and 2.5 mg and up to 15 mg with no response,  Lamotrigine 300 NR buspirone 30 mg,  Cytomel, ithium 625,  pramipexole with insomnia,   Lunesta with crying spells,  topiramate, Deplin, propranolol,   Ritalin, Focalin NR, Concerta 72 helped No history of Prozac, Viibryd.  Review of Systems:  Review of Systems  Constitutional: Positive for appetite change. Negative for diaphoresis and fatigue.  Cardiovascular: Negative for chest pain and palpitations.  Gastrointestinal: Negative for abdominal pain and nausea.  Neurological: Positive for tremors and headaches. Negative for dizziness, weakness and light-headedness.  Psychiatric/Behavioral: Positive for  depression. Negative for agitation, behavioral problems, confusion, decreased concentration, hallucinations, self-injury, sleep disturbance and suicidal ideas. The patient is not hyperactive.     Medications: I have reviewed the patient's current medications.  Current Outpatient Medications  Medication Sig Dispense Refill  . ALPRAZolam (XANAX) 0.25 MG tablet Take 1 tablet (0.25 mg total) by mouth daily as needed for anxiety. 30 tablet 0  . atorvastatin (LIPITOR) 20 MG tablet TAKE ONE TABLET BY MOUTH ONCE DAILY 90 tablet 1  . Calcium Carbonate-Vitamin D (OSCAL 500/200 D-3 PO) Take by mouth.    . Cholecalciferol (VITAMIN D PO) Take by mouth.    . levothyroxine (SYNTHROID) 100 MCG tablet TAKE 1 TABLET BY MOUTH EVERY DAY 90 tablet 1  . mirtazapine (REMERON) 30 MG tablet TAKE 2 TABLETS BY MOUTH AT BEDTIME 180 tablet 1  . PARoxetine (PAXIL) 40 MG tablet TAKE 1 TABLET BY MOUTH TWICE A DAY 180 tablet 1  . propranolol (INDERAL) 20 MG tablet TAKE 1 TO 2  TABLETS TWICE A DAY AS NEEDED FOR TREMOR 100 tablet 3  . risedronate (ACTONEL) 150 MG tablet TAKE 1 TABLET EVERY 30 DAYS WITH WATER ON EMPTY STOMACH, NOTHING BY MOUTH OR LIE DOWN FOR 3 tablet 1  . risperiDONE (RISPERDAL) 3 MG tablet TAKE 1 TABLET (3 MG TOTAL) BY MOUTH AT BEDTIME. 90 tablet 1  . SUMAtriptan (IMITREX) 100 MG tablet Take 100 mg by mouth daily as needed for migraine.    . topiramate (TOPAMAX) 100 MG tablet TAKE 1 TABLET BY MOUTH TWICE A DAY 180 tablet 0  . Lurasidone HCl (LATUDA) 60 MG TABS Take 1 tablet (60 mg total) by mouth daily with breakfast. 30 tablet 1  . [START ON 01/18/2021] methylphenidate (CONCERTA) 36 MG PO CR tablet Take 2 tablets (72 mg total) by mouth daily. 60 tablet 0  . [START ON 12/21/2020] methylphenidate (CONCERTA) 36 MG PO CR tablet Take 2 tablets (72 mg total) by mouth daily. 60 tablet 0  . methylphenidate (CONCERTA) 36 MG PO CR tablet Take 2 tablets (72 mg total) by mouth daily. 60 tablet 0   No current facility-administered medications for this visit.    Medication Side Effects:  Watch jaw tremor  Allergies:  Allergies  Allergen Reactions  . Ampicillin Rash    Past Medical History:  Diagnosis Date  . Gum lesion    Biopsy done-benign  . Hypercholesteremia   . Hypothyroid   . LGSIL (low grade squamous intraepithelial dysplasia) 2010  . Migraines   . OCD (obsessive compulsive disorder)   . Osteoporosis 01/2016   T score -2.7    Family History  Problem Relation Age of Onset  . Hypertension Mother   . Heart disease Mother   . Hypertension Father   . Breast cancer Paternal Aunt        Age 61  . Diabetes Neg Hx     Social History   Socioeconomic History  . Marital status: Single    Spouse name: Not on file  . Number of children: Not on file  . Years of education: Not on file  . Highest education level: Not on file  Occupational History  . Not on file  Tobacco Use  . Smoking status: Never Smoker  . Smokeless tobacco: Never Used   Vaping Use  . Vaping Use: Never used  Substance and Sexual Activity  . Alcohol use: Yes    Alcohol/week: 0.0 standard drinks    Comment: wine occassionally  . Drug use: No  .  Sexual activity: Never    Birth control/protection: Post-menopausal    Comment: 1st intercourse 42 yo-5 partners  Other Topics Concern  . Not on file  Social History Narrative  . Not on file   Social Determinants of Health   Financial Resource Strain: Not on file  Food Insecurity: Not on file  Transportation Needs: Not on file  Physical Activity: Not on file  Stress: Not on file  Social Connections: Not on file  Intimate Partner Violence: Not on file    Past Medical History, Surgical history, Social history, and Family history were reviewed and updated as appropriate.   Please see review of systems for further details on the patient's review from today.   Objective:   Physical Exam:  LMP 11/19/2010   Physical Exam Constitutional:      General: She is not in acute distress.    Appearance: Normal appearance. She is well-developed.  Musculoskeletal:        General: No deformity.  Neurological:     Mental Status: She is alert and oriented to person, place, and time.     Coordination: Coordination normal.  Psychiatric:        Attention and Perception: She is attentive. She does not perceive auditory hallucinations.        Mood and Affect: Mood is anxious and depressed. Affect is not labile, blunt, angry or inappropriate.        Speech: Speech normal.        Behavior: Behavior normal.        Thought Content: Thought content includes suicidal ideation. Thought content does not include homicidal ideation. Thought content does not include homicidal or suicidal plan.        Cognition and Memory: Cognition normal.        Judgment: Judgment normal.     Comments: Insight intact. No auditory or visual hallucinations. No delusions.  More OCD with job changes triggered more checking. SI resolved on  risperidone       Lab Review:     Component Value Date/Time   NA 139 11/14/2020 0804   K 4.0 11/14/2020 0804   CL 104 11/14/2020 0804   CO2 27 11/14/2020 0804   GLUCOSE 96 11/14/2020 0804   BUN 15 11/14/2020 0804   CREATININE 0.82 11/14/2020 0804   CALCIUM 9.8 11/14/2020 0804   PROT 6.9 11/14/2020 0804   ALBUMIN 4.6 11/14/2020 0804   AST 19 11/14/2020 0804   ALT 19 11/14/2020 0804   ALKPHOS 41 11/14/2020 0804   BILITOT 0.4 11/14/2020 0804       Component Value Date/Time   WBC 5.9 01/29/2016 0834   RBC 4.85 01/29/2016 0834   HGB 15.2 (H) 01/29/2016 0834   HCT 46.2 (H) 01/29/2016 0834   PLT 232.0 01/29/2016 0834   MCV 95.3 01/29/2016 0834   MCHC 33.0 01/29/2016 0834   RDW 14.7 01/29/2016 0834   LYMPHSABS 1.3 01/29/2016 0834   MONOABS 0.4 01/29/2016 0834   EOSABS 0.3 01/29/2016 0834   BASOSABS 0.0 01/29/2016 0834    No results found for: POCLITH, LITHIUM   No results found for: PHENYTOIN, PHENOBARB, VALPROATE, CBMZ   .res Assessment: Plan:    Ashlee Chavez was seen today for follow-up, depression and anxiety.  Diagnoses and all orders for this visit:  Major depressive disorder, recurrent episode, moderate (HCC)  Mixed obsessional thoughts and acts  Social anxiety disorder  Insomnia due to psychological stress  Attention deficit hyperactivity disorder (ADHD), predominantly inattentive type -  methylphenidate (CONCERTA) 36 MG PO CR tablet; Take 2 tablets (72 mg total) by mouth daily. -     methylphenidate (CONCERTA) 36 MG PO CR tablet; Take 2 tablets (72 mg total) by mouth daily. -     methylphenidate (CONCERTA) 36 MG PO CR tablet; Take 2 tablets (72 mg total) by mouth daily.  Other orders -     Lurasidone HCl (LATUDA) 60 MG TABS; Take 1 tablet (60 mg total) by mouth daily with breakfast.   We discussed TRD and anxiety but has partial response with the higher dosage of mirtazapine 60mg  HS. she has a long list of failed psychiatric meds.  She has not tended to  do well with higher dosages of SSRIs.  Tolerating mirtazapine and it does help her depression.  She has chronic residual anxiety which is significant. She was however markedly worse with anxiety depression and other symptoms as noted after stopping risperidone by mistake.  These symptoms were due to stopping risperidone and The risperidone was markedly helpful in resolving the suicidal thoughts and improving depression and anxiety.    5-HTP supplement 200 mg daily  Increase Latuda to 60 mg  for 3 weeks, If no benefit and no SE increase to 80 mg daily with food.  ADD still better with Concerta 72 mg versus Focalin. . . Continue high dosage paroxetine 80 mg daily. Tolerated.  She is continuing psychotherapy with Dr. Farrel DemarkMitchum  Discussed potential metabolic side effects associated with atypical antipsychotics, as well as potential risk for movement side effects. Advised pt to contact office if movement side effects occur.  She agrees with the plan  TMS was discussed again.   Follow-up in 6 weeks  Meredith Staggersarey Cottle MD, DFAPA  Future Appointments  Date Time Provider Department Center  12/04/2020  4:00 PM Robley FriesMitchum, Robert, PhD [facility] None  02/06/2021  3:30 PM Theresia MajorsKendall, Jeffrey, MD [facility] None  05/15/2021  8:15 AM [facility] None  05/17/2021  8:15 AM Reather LittlerKumar, Ajay, MD [facility] None    No orders of the defined types were placed in this encounter.     -------------------------------

## 2020-11-28 ENCOUNTER — Other Ambulatory Visit: Payer: Self-pay | Admitting: Endocrinology

## 2020-12-04 ENCOUNTER — Ambulatory Visit (INDEPENDENT_AMBULATORY_CARE_PROVIDER_SITE_OTHER): Payer: BC Managed Care – PPO | Admitting: Psychiatry

## 2020-12-04 ENCOUNTER — Other Ambulatory Visit: Payer: Self-pay

## 2020-12-04 DIAGNOSIS — Z634 Disappearance and death of family member: Secondary | ICD-10-CM

## 2020-12-04 DIAGNOSIS — F422 Mixed obsessional thoughts and acts: Secondary | ICD-10-CM

## 2020-12-04 DIAGNOSIS — F5102 Adjustment insomnia: Secondary | ICD-10-CM

## 2020-12-04 DIAGNOSIS — F331 Major depressive disorder, recurrent, moderate: Secondary | ICD-10-CM

## 2020-12-04 DIAGNOSIS — E638 Other specified nutritional deficiencies: Secondary | ICD-10-CM

## 2020-12-04 DIAGNOSIS — F4010 Social phobia, unspecified: Secondary | ICD-10-CM | POA: Diagnosis not present

## 2020-12-04 DIAGNOSIS — Z9189 Other specified personal risk factors, not elsewhere classified: Secondary | ICD-10-CM

## 2020-12-04 DIAGNOSIS — F908 Attention-deficit hyperactivity disorder, other type: Secondary | ICD-10-CM

## 2020-12-04 NOTE — Progress Notes (Signed)
Psychotherapy Progress Note [facility] [name], PhD LP  Patient ID: Ashlee Chavez     MRN: [id number] Therapy format: Individual psychotherapy Date: 12/04/2020      Start: 4:21p     Stop: 5:07p     Time Spent: 46 min Location: In-person   Session narrative (presenting needs, interim history, self-report of stressors and symptoms, applications of prior therapy, status changes, and interventions made in session) Latuda increased to 60mg  a week and a half ago, another week to go to 80mg .  Just started 5-HTP 4 nights ago.  No ill effects, and no effects from Latuda increase.  Only on 200mg  5-HTP so far.  Says everything is harder to remember and attend to.  Has made unintentional mistakes at work now, which she can see is anxiety driving her to rush herself and lose concentration, but also further anxiety-provoking.  Now more "paranoid" that she's going to be talked about as failing in her work.  Every other week gets boring, on top of it, since it's tied to payroll timing.  No recourse to smooth out the waves of responsibility, so she alternates bored and overly pressured, feels there is no way to amend that.    Feels like every day is like wearing a coat made of rocks.  Does everything routine, same order, daily to minimize having to think.  No appetite, trying to eat something despite not feeling like it.  Starts every day in dread, of waking, of showering, of scooping the several litter boxes that have to be so the various cats won't rebel.  Makes PBJ for lunch every day, cheese sandwich at night.  Has set herself a day off this Friday, will take care for car and get groceries she can cook.  Agreed it sounds significantly more severe.  Est. 70% of her current distress has to do with feeling intimidated by her new responsibilities and the threat of making mistakes, which of course becomes self-fulfilling.  Has been wondering whether she should apply for  disability.  Discussed at length the ramifications and realities of applying for disability and supportively confronted looking to it as a way out of worry, when she needs more to address mindset toward current work, clearer communication with her boss about possibly restructuring, attempt accommodations (despite severe embarrassment), and address physiological degradation from diet, sleep patterns, restricted lifestyle, and the overwork of keeping up perceived necessities in cleaning, ADLs, and cat care.  Therapeutic modalities: Cognitive Behavioral Therapy, Solution-Oriented/Positive Psychology and Ego-Supportive  Mental Status/Observations:  Appearance:   Casual and Neat     Behavior:  Appropriate  Motor:  Normal  Speech/Language:   Clear and Coherent  Affect:  Appropriate and shaken  Mood:  anxious and depressed  Thought process:  some minor blocking  Thought content:    Obsessions and Rumination  Sensory/Perceptual disturbances:    WNL  Orientation:  Fully oriented  Attention:  Fair    Concentration:  Fair  Memory:  WNL  Insight:    Fair  Judgment:   Good  Impulse Control:  Good   Risk Assessment: Danger to Self: No Self-injurious Behavior: No Danger to Others: No Physical Aggression / Violence: No Duty to Warn: No Access to Firearms a concern: No  Assessment of progress:  deteriorating -- in need of reassessment  Diagnosis:   ICD-10-CM   1. Major depressive disorder, recurrent episode, moderate (HCC)  F33.1   2. Mixed obsessional thoughts and acts  F42.2  3. Social anxiety disorder  F40.10   4. Insomnia due to psychological stress  F51.02   5. Imbalanced nutrition  E63.8   6. At high risk for loneliness  Z91.89   7. Attention deficit hyperactivity disorder (ADHD), other type - secondary to anxiety and depression  F90.8   8. Bereavement  Z63.4    Plan:  . Improve nutrition and variety . Engage movement/exercise better . Work up to speak to boss about readjusting  duties or flex time to smooth out oscillating workload . Do not endorse disability application -- it would both prevent working out approach to stress and wind up a waste of effort and further demoralizing . Other recommendations/advice as may be noted above . Continue to utilize previously learned skills ad lib . Maintain medication as prescribed and work faithfully with relevant prescriber(s) if any changes are desired or seem indicated . Call the clinic on-call service, present to ER, or call 911 if any life-threatening psychiatric crisis Return 2 wks recommended. . Already scheduled visit in this office 01/16/2021.  Robley Fries, PhD [name], PhD LP Clinical Psychologist, [facility] [address], [address] [place], [place] [address] [phone]

## 2020-12-09 ENCOUNTER — Other Ambulatory Visit: Payer: Self-pay | Admitting: Psychiatry

## 2021-01-03 DIAGNOSIS — Z1231 Encounter for screening mammogram for malignant neoplasm of breast: Secondary | ICD-10-CM | POA: Diagnosis not present

## 2021-01-14 ENCOUNTER — Other Ambulatory Visit: Payer: Self-pay | Admitting: Psychiatry

## 2021-01-15 ENCOUNTER — Other Ambulatory Visit: Payer: Self-pay

## 2021-01-15 ENCOUNTER — Ambulatory Visit (INDEPENDENT_AMBULATORY_CARE_PROVIDER_SITE_OTHER): Payer: BC Managed Care – PPO | Admitting: Psychiatry

## 2021-01-15 DIAGNOSIS — F422 Mixed obsessional thoughts and acts: Secondary | ICD-10-CM

## 2021-01-15 DIAGNOSIS — F4321 Adjustment disorder with depressed mood: Secondary | ICD-10-CM | POA: Diagnosis not present

## 2021-01-15 DIAGNOSIS — E638 Other specified nutritional deficiencies: Secondary | ICD-10-CM

## 2021-01-15 DIAGNOSIS — Z9189 Other specified personal risk factors, not elsewhere classified: Secondary | ICD-10-CM

## 2021-01-15 DIAGNOSIS — F4010 Social phobia, unspecified: Secondary | ICD-10-CM

## 2021-01-15 DIAGNOSIS — F332 Major depressive disorder, recurrent severe without psychotic features: Secondary | ICD-10-CM | POA: Diagnosis not present

## 2021-01-15 DIAGNOSIS — R69 Illness, unspecified: Secondary | ICD-10-CM

## 2021-01-15 NOTE — Progress Notes (Signed)
Psychotherapy Progress Note [facility] [name], PhD LP  Patient ID: Ashlee Chavez     MRN: [id number] Therapy format: Individual psychotherapy Date: 01/15/2021      Start: 4:08p     Stop: 5:07p     Time Spent: 45 min (remainder donated) Location: In-person   Session narrative (presenting needs, interim history, self-report of stressors and symptoms, applications of prior therapy, status changes, and interventions made in session) Med check January notes off lamotrigine and higher Latuda no benefit.  Raised Latuda again, recommended increase 5-HTP, and discussed TMS.  Last therapy visit just after.  Now 6 weeks sine last seen, never feels she can afford to come in sooner than that, based on longstanding decision to send very large amounts of pay to retirement so she can continue to qualify for [employer], which makes regular therapy impossible to afford without going into some debt, which she finds abhorrent.    Today says no difference with 6 weeks of higher still dose of Latuda.  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  Just can't take it, feels absolutely stuck, crumbling.    Did take the day 6 weeks ago as planned to prepare a lot of better food than her two sandwiches a day.  Meats, mainly, which have lasted till now.  Plans another day off to cook.  Says she's no cook, but the meat was pretty satisfying -- a soft sign of vitamin deficiency.  Advised she may be iron deficient -- no Hgb results on EHR -- or B12/folate deficient, each of which would make sense for being so worn and poorly nourished for so long.    Still sleeping 7:30pm - 4:30am, under the influence of mirtazapine and risperidone, anda good 2 hrs earlier than the old normal.  3 hour routine in the mornings to "get ready" -- shower, wash hair, feels she can hardly move.  No c/o repeating behavior,  overcleaning.  Scooping 3 cat boxes for 2 cats, every morning and every evening, plus vacuuming and removing litter -- 15-20 minutes per trip.  So tired.  Discussed possibly giving up one cat for rescue, but can't conscience that, would wonder   Back to the daily ordeal of worrying about making mistakes, discussed at length the possibility of modifications to her job.  Again feels she can't ask such things, fears looking inadequate, failing, or having her job cut down if it's discovered she isn't that busy some of the time.  Challenged to consider what happens if she does not speak up and eventually deteriorates to the point of needing medical leave, disability, or hospitalization, and what an ordeal it would be trying to catch up understanding why those. suddenly happen when she's been working so hard to put on as competent and busy.  Offer made to speak with or for her if she will allow it.  Strongly advised return 2 weeks, refused.  Generated some tears when referencing losses of father and [name], both traumatic and painful.  C/o not being able to cry, attributed to medication.  Discussed possible basis.  Volunteers that she wants to come off the antipsychotic, feels it's working backwards with her.  Possibility high dose SSRI is blocking normal emotional expression.  Possibility any array of med SEs could be triggers to feeling off, anxious, and painfully self-conscious.  Therapeutic modalities: Cognitive Behavioral Therapy and Solution-Oriented/Positive Psychology  Mental Status/Observations:  Appearance:  Casual and Neat     Behavior:  Resistant and shaken  Motor:  Normal  Speech/Language:   clear, fluent  Affect:  Constricted  Mood:  depressed and tearful after a while  Thought process:  some blocking  Thought content:    worry, rumination  Sensory/Perceptual disturbances:    WNL  Orientation:  Fully oriented  Attention:  Good    Concentration:  Good  Memory:  WNL  Insight:    Fair   Judgment:   Fair  Impulse Control:  Good   Risk Assessment: Danger to Self: No Self-injurious Behavior: No Danger to Others: No Physical Aggression / Violence: No Duty to Warn: No Access to Firearms a concern: No  Assessment of progress:  deteriorating -- in need of reassessment  Diagnosis:   ICD-10-CM   1. 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 Mixed obsessional thoughts and acts  F42.2   3. At high risk for loneliness  Z91.89   4. 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 Social anxiety disorder  F40.10   6. Imbalanced nutrition  E63.8   7. R/O B12/folate deficiencies, anemia, monoamine synthesis problem  R69    Plan:  . Review with psychiatrist tomorrow: o whether checking B12, folate, anemia would be in order  o whether atypical antipsychotics are zapping motivation and initiative rather than calming anxiety o whether high-dose SSRI is too frustrating to her natural emotions and expression, especially capacity to cry o and whether the collection of medication SEs is so provocative to self-doubt and anxiety that it is working paradoxically, causing more OCD, second-guessing, and demoralization -- in which case worth dismantling complicated regimen . 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, prolonged, frequent worry about making costly mistakes and being ashamed o whether to add some duties to slow periods, because being idle is severely ego-dystonic and just puts her through more social anxiety and shame o whether she could permit reduced requirement to be in the office, since she labors under the compulsion to look busy and deeply fears people thinking she's actually just lazy o whether she can have unambiguous approval to do personal things on work time if she is on call but out of things to do o whether it would help to bring TX into the  conversation to represent these concerns and accommodation ideas to alleviate the anxiety of speaking up for herself and work out ways to effectively mitigate work stress and the risk of eventual disability or absenteeism . Consider as last resort giving up one cat to rescue to reduce the tedium of cleaning up; if needed for guilt, ask what becomes of her cat . Other recommendations/advice as may be noted above . Continue to utilize previously learned skills ad lib . Maintain medication as prescribed and work faithfully with relevant prescriber(s) if any changes are desired or seem indicated . Call the clinic on-call service, present to ER, or call 911 if any life-threatening psychiatric crisis Return in about 2 weeks (around 01/29/2021). . Already scheduled visit in this office 01/16/2021.  Robley Fries, PhD [name], PhD LP Clinical Psychologist, [facility] [address], [address] [place], [place] [address] [phone]

## 2021-01-16 ENCOUNTER — Ambulatory Visit (INDEPENDENT_AMBULATORY_CARE_PROVIDER_SITE_OTHER): Payer: BC Managed Care – PPO | Admitting: Psychiatry

## 2021-01-16 ENCOUNTER — Encounter: Payer: Self-pay | Admitting: Psychiatry

## 2021-01-16 VITALS — BP 106/71 | HR 87

## 2021-01-16 DIAGNOSIS — F908 Attention-deficit hyperactivity disorder, other type: Secondary | ICD-10-CM

## 2021-01-16 DIAGNOSIS — F5102 Adjustment insomnia: Secondary | ICD-10-CM

## 2021-01-16 DIAGNOSIS — F422 Mixed obsessional thoughts and acts: Secondary | ICD-10-CM

## 2021-01-16 DIAGNOSIS — F4010 Social phobia, unspecified: Secondary | ICD-10-CM | POA: Diagnosis not present

## 2021-01-16 DIAGNOSIS — F332 Major depressive disorder, recurrent severe without psychotic features: Secondary | ICD-10-CM | POA: Diagnosis not present

## 2021-01-16 MED ORDER — CLOMIPRAMINE HCL 25 MG PO CAPS
ORAL_CAPSULE | ORAL | 1 refills | Status: DC
Start: 2021-01-16 — End: 2021-03-15

## 2021-01-16 NOTE — Patient Instructions (Addendum)
Start clomipramine 25 mg capsules 1 at night for 1 week, Then increase clomipramine to 2 capsules at night and reduce paroxetine to 1-1/2 tablets or 60 mg daily. Wait 1 week and then get the blood test  Reduce mirtazapine to 30 mg HS  Wean Latuda over a week.

## 2021-01-16 NOTE — Progress Notes (Signed)
CHOLE DRIVER [id number] May 25, 1961 60 y.o.  Subjective:   Patient ID:  Ashlee Chavez is a 59 y.o. (DOB 08/19/1961) female.  Chief Complaint:  Chief Complaint  Patient presents with  . Follow-up  . Major depressive disorder, recurrent episode, moderate (HCC)  . Depression  . Anxiety    Depression        Associated symptoms include appetite change and headaches.  Associated symptoms include no decreased concentration, no fatigue and no suicidal ideas.  Ashlee Chavez presents today for follow-up of recently worse depression DT relationship loss.    When seen February 10, 2019.  She had received some benefit from Ritalin 20 mg 3 times daily but was having issues with the crash.  We switched to Concerta 72 mg every morning.  September 14, 2019.  She was having persistent bothersome OCD and was interested in further medicine changes to try to help with these symptoms because she has aggressively pursued therapy and still has significant symptoms.  She has been on multiple SSRIs and so we initiated augmentation with risperidone half milligram daily to increase to 1 mg daily. Sig improvement with risperidone.  SE low appetite with it.  Not hungry for dinner. Sleep so much better also.  Improvement is "marked".  Think better bc less obsessing time.  Not constant now.  Noticed benefit within the first week.  Less obsessing lessened depression.  But residual symptoms.  visit October 14, 2019.  She was encouraged to experiment with the dosing of the risperidone up to 2 mg or 3 mg nightly to see if she could get additional reduction in her chronic anxiety and OCD.   seen December 15, 2019.  Settled at 2 mg risperidone bc increased sleep.  Sleep is so much better.  Harder to get up in the morning.   Found out insurance won't cover Concerta anymore.  Not as good on Ritalin TID.  Not as even a response.  Terrible insurance. Therefore the only med change with switching to Focalin to see if it would be  covered better.  Appt 03/09/20, switch to Concerta 72 mg is better with better mood and motivation vs Focalin.  Focus is also better.  Very poor function with Focalin and better with Concerta.   Better overall with more even response to stimulant and better appetite.  No SE.  Duration all day.  Less desperate hopeless feelings on the stimulant.  Stressful times. With increase risperidone a little better mood bump.  SE a little shakiness noticed in jaw.   Not unusual levels of anxiety.  Lost her best friend 1 year ago and still grieving.  Not much family. Alone.  Not close to B and S who live elsewhere.  Talks to God daily.  Gets to work daily.  Function is good there.  Reads and calls people.  OCD no worse with Covid. Did join a Bible study which was a huge step. Plan start lamotrigine   04/04/20 appt with the following noted: Appt moved up.  She thought she was supposed to stop risperidone when started lamotrigine.  Started having SI off the risperidone but wasn't sure if it was SE.  No risperidone 3 mg for 3 weeks.Anxiety shaking sweating crying all started right away.  Also trouble with sleep and can't eat.  Plan:Restart risperidone 1 mg now and another milligram in 2 to 3 hours if not feeling sufficient relief to get the full dose of 3 mg today.  Then resume 3 mg  risperidone each evening. Restart lamotrigine 25 mg daily for 2 weeks and follow the previous instructions on increasing it.  05/11/2020 appointment the following is noted: Marked improvement back on risperidone 3 mg.  SE shakes jaw.  Doesn't affect typing. Started lamotrigine and up to 100 mg for 2 weeks.  Feels it's helped depression. Better mood and able to laugh.  Able to get pizza for Bible study and not cry. Apptetite is not great.  Working on it. Anxiety is better not gone. Plan: no med changes  07/26/20 appt with the following noted; Increased lamotrigine gradually to 200 mg daily over the phone 6 weeks ago.  No improvement  really for depression. With it. CC depression.   Risperidone still helping anxiety. Plan: Increase lamotrigine to 300 mg daily..  09/14/2020 appointment with following noted: Started taper of lamotrigine DT NR. Started latuda 20 mg daily for 10 days.  Tolerated fine.  No effects so far.. Main problem is still depression.  Anxiety is not too bad.  No energy, motivation, sleeeping a lot.  No sig SI. In past started risperidone for anxiety and depression.  Increase Latuda to 40 mg daily. Disc potential DDI with risperidone reducing effectiveness of  Latuda so reduc risperidone to 1.5 mg HS  10/09/2020 phone call from patient: Patient left a voicemail on Friday while we were out of office for the Holiday that she is not any better. She feels the cutting in the 1/2 of Risperidone has really increased her anxiety. She was instructed to call back if not any better but concerned that taking away the Risperidone will really exacerbate her anxiety.  MD response:  At the last appointment we increased Latuda to 40 mg daily and reduced risperidone to half tablet daily.  The goal was to use Latuda and eliminate risperidone.  However since her anxiety has worsened with the reduction in risperidone, I agree with her and she should take the full dose of risperidone as well as continue the Latuda 40 mg daily.  The [name] is intended to help depression.  11/23/2020 appointment with the following noted: Propranolol only once daily. No better with higher Latuda.  No SE either. No change in appetite which is poor.  Still struggling with depression and OCD is a little worse with changes at work. Off lamotrigine. Plan: 5-HTP supplement 200 mg daily Increase Latuda to 60 mg  for 3 weeks, If no benefit and no SE increase to 80 mg daily with food. ADD still better with Concerta 72 mg versus Focalin. Continue high dosage paroxetine 80 mg daily.  01/16/2021 appointment with following noted: Increase Latuda to 80 mg  without benefit or SE.  Anxiety and depression with poor STM.  Food getting caught in her throat.  No change in eating.  No change in weight.   Anxious thoughts mostly about work even when not there.  Anxious about work needed on the house she can't afford.  Enjoys reading but feels guilty that she should be doing something else.  More general anxiety than OCD.  [name] and describes severe..  Anxiety symptoms include: Excessive Worry, Obsessive Compulsive Symptoms:   Handwashing,,.  Pt reports no sleep issues, it's better 7-8 hours. Pt reports that appetite is good. Pt reports that energy is good and down slightly. Concentration is no change. Suicidal thoughts:  denied by patient.  Numerous failed psychiatric medication trials include Lexapro 60 mg a day, Zoloft 250, Paxil with weight gain,  Clomipramine,  Wellbutrin 300 with side effects, l Rexulti which helped initially,  Vraylar, Abilify between 1 and 2.5 mg and up to 15 mg with no response,  Latuda 80 failed Lamotrigine 300 NR buspirone 30 mg,  Cytomel,  lithium 625,  pramipexole with insomnia,   Lunesta with crying spells,  topiramate, Deplin, propranolol,   Ritalin, Focalin NR, Concerta 72 helped No history of Prozac, Viibryd.  Review of Systems:  Review of Systems  Constitutional: Positive for appetite change. Negative for diaphoresis, fatigue and unexpected weight change.  Cardiovascular: Negative for chest pain and palpitations.  Gastrointestinal: Negative for abdominal pain and nausea.  Neurological: Positive for tremors and headaches. Negative for dizziness, weakness and light-headedness.  Psychiatric/Behavioral: Positive for depression. Negative for agitation, behavioral problems, confusion, decreased concentration, hallucinations, self-injury, sleep disturbance and suicidal ideas. The patient is not hyperactive.     Medications: I have reviewed the patient's current  medications.  Current Outpatient Medications  Medication Sig Dispense Refill  . ALPRAZolam (XANAX) 0.25 MG tablet Take 1 tablet (0.25 mg total) by mouth daily as needed for anxiety. 30 tablet 0  . atorvastatin (LIPITOR) 20 MG tablet TAKE ONE TABLET BY MOUTH ONCE DAILY 90 tablet 1  . Calcium Carbonate-Vitamin D (OSCAL 500/200 D-3 PO) Take by mouth.    . Cholecalciferol (VITAMIN D PO) Take by mouth.    . clomiPRAMINE (ANAFRANIL) 25 MG capsule 1 at night for 1 week then 2 at night 60 capsule 1  . levothyroxine (SYNTHROID) 100 MCG tablet TAKE 1 TABLET BY MOUTH EVERY DAY 90 tablet 1  . lurasidone (LATUDA) 20 MG TABS tablet Take by mouth.    . Lurasidone HCl (LATUDA) 60 MG TABS Take 1 tablet (60 mg total) by mouth daily with breakfast. 30 tablet 1  . [START ON 01/18/2021] methylphenidate (CONCERTA) 36 MG PO CR tablet Take 2 tablets (72 mg total) by mouth daily. 60 tablet 0  . methylphenidate (CONCERTA) 36 MG PO CR tablet Take 2 tablets (72 mg total) by mouth daily. 60 tablet 0  . methylphenidate (CONCERTA) 36 MG PO CR tablet Take 2 tablets (72 mg total) by mouth daily. 60 tablet 0  . mirtazapine (REMERON) 30 MG tablet TAKE 2 TABLETS BY MOUTH AT BEDTIME 180 tablet 1  . PARoxetine (PAXIL) 40 MG tablet TAKE 1 TABLET BY MOUTH TWICE A DAY 180 tablet 1  . propranolol (INDERAL) 20 MG tablet TAKE 1 TO 2 TABLETS TWICE A DAY AS NEEDED FOR TREMOR 100 tablet 3  . risedronate (ACTONEL) 150 MG tablet TAKE 1 TABLET EVERY 30 DAYS WITH WATER ON EMPTY STOMACH, NOTHING BY MOUTH OR LIE DOWN FOR 3 tablet 1  . risperiDONE (RISPERDAL) 3 MG tablet TAKE 1 TABLET (3 MG TOTAL) BY MOUTH AT BEDTIME. 90 tablet 1  . SUMAtriptan (IMITREX) 100 MG tablet Take 100 mg by mouth daily as needed for migraine.    . topiramate (TOPAMAX) 100 MG tablet TAKE 1 TABLET BY MOUTH TWICE A DAY 180 tablet 0   No current facility-administered medications for this visit.    Medication Side Effects:  Watch jaw tremor  Allergies:  Allergies   Allergen Reactions  . Ampicillin Rash    Past Medical History:  Diagnosis Date  . Gum lesion    Biopsy done-benign  . Hypercholesteremia   . Hypothyroid   . LGSIL (low grade squamous intraepithelial dysplasia) 2010  . Migraines   . OCD (obsessive compulsive disorder)   . Osteoporosis 01/2016   T score -2.7  Family History  Problem Relation Age of Onset  . Hypertension Mother   . Heart disease Mother   . Hypertension Father   . Breast cancer Paternal Aunt        Age 59  . Diabetes Neg Hx     Social History   Socioeconomic History  . Marital status: Single    Spouse name: Not on file  . Number of children: Not on file  . Years of education: Not on file  . Highest education level: Not on file  Occupational History  . Not on file  Tobacco Use  . Smoking status: Never Smoker  . Smokeless tobacco: Never Used  Vaping Use  . Vaping Use: Never used  Substance and Sexual Activity  . Alcohol use: Yes    Alcohol/week: 0.0 standard drinks    Comment: wine occassionally  . Drug use: No  . Sexual activity: Never    Birth control/protection: Post-menopausal    Comment: 1st intercourse 59 yo-5 partners  Other Topics Concern  . Not on file  Social History Narrative  . Not on file   Social Determinants of Health   Financial Resource Strain: Not on file  Food Insecurity: Not on file  Transportation Needs: Not on file  Physical Activity: Not on file  Stress: Not on file  Social Connections: Not on file  Intimate Partner Violence: Not on file    Past Medical History, Surgical history, Social history, and Family history were reviewed and updated as appropriate.   Please see review of systems for further details on the patient's review from today.   Objective:   Physical Exam:  BP 106/71   Pulse 87   LMP 11/19/2010   Physical Exam Constitutional:      General: She is not in acute distress.    Appearance: Normal appearance. She is well-developed.   Musculoskeletal:        General: No deformity.  Neurological:     Mental Status: She is alert and oriented to person, place, and time.     Coordination: Coordination normal.  Psychiatric:        Attention and Perception: She is attentive. She does not perceive auditory hallucinations.        Mood and Affect: Mood is anxious and depressed. Affect is not labile, blunt, angry or inappropriate.        Speech: Speech is delayed.        Behavior: Behavior is slowed.        Thought Content: Thought content is not paranoid. Thought content does not include homicidal or suicidal ideation. Thought content does not include homicidal or suicidal plan.        Cognition and Memory: Cognition normal.        Judgment: Judgment normal.     Comments: Insight intact. No auditory or visual hallucinations. No delusions.  More OCD with job changes triggered more checking. SI resolved on risperidone       Lab Review:     Component Value Date/Time   NA 139 11/14/2020 0804   K 4.0 11/14/2020 0804   CL 104 11/14/2020 0804   CO2 27 11/14/2020 0804   GLUCOSE 96 11/14/2020 0804   BUN 15 11/14/2020 0804   CREATININE 0.82 11/14/2020 0804   CALCIUM 9.8 11/14/2020 0804   PROT 6.9 11/14/2020 0804   ALBUMIN 4.6 11/14/2020 0804   AST 19 11/14/2020 0804   ALT 19 11/14/2020 0804   ALKPHOS 41 11/14/2020 0804   BILITOT 0.4  11/14/2020 0804       Component Value Date/Time   WBC 5.9 01/29/2016 0834   RBC 4.85 01/29/2016 0834   HGB 15.2 (H) 01/29/2016 0834   HCT 46.2 (H) 01/29/2016 0834   PLT 232.0 01/29/2016 0834   MCV 95.3 01/29/2016 0834   MCHC 33.0 01/29/2016 0834   RDW 14.7 01/29/2016 0834   LYMPHSABS 1.3 01/29/2016 0834   MONOABS 0.4 01/29/2016 0834   EOSABS 0.3 01/29/2016 0834   BASOSABS 0.0 01/29/2016 0834    No results found for: POCLITH, LITHIUM   No results found for: PHENYTOIN, PHENOBARB, VALPROATE, CBMZ   .res Assessment: Plan:    Ashlee Chavez was seen today for follow-up, major depressive  disorder, recurrent episode, moderate (hcc), depression and anxiety.  Diagnoses and all orders for this visit:  Major depressive disorder, recurrent severe without psychotic features (HCC) -     Clomipramine and metabolite, serum -     clomiPRAMINE (ANAFRANIL) 25 MG capsule; 1 at night for 1 week then 2 at night  Mixed obsessional thoughts and acts -     Clomipramine and metabolite, serum -     clomiPRAMINE (ANAFRANIL) 25 MG capsule; 1 at night for 1 week then 2 at night  Social anxiety disorder  Attention deficit hyperactivity disorder (ADHD), other type - secondary to anxiety and depression  Insomnia due to psychological stress   We discussed TRD and anxiety but has partial response with the higher dosage of mirtazapine 60mg  HS. she has a long list of failed psychiatric meds.  She has not tended to do well with higher dosages of SSRIs. She has chronic residual anxiety which is significant. Patient is markedly depressed and anxious.  She is afraid of doing TMS.  She knows her fears are exaggerated and irrational.  Chart review revealed that prior to starting treatment here patient had taken clomipramine in the past with some benefit but also with some side effects.  It is a tricyclic antidepressant distinctly different from SSRIs.  Some patients respond better to them.  She has a history of previous benefit.  It makes sense to retry that medication.  We discussed the pros and cons of combining it with Luvox but just not appropriate while she is on paroxetine.  We discussed the value of doing blood levels with tricyclic antidepressants like clomipramine.  She agrees to retry that medication.  5-HTP supplement 200 mg daily  Wean Latuda over a week DT NR  Reduce mirtazapine to 30 mg HS   ADD still better with Concerta 72 mg versus Focalin. . . Start clomipramine 25 mg capsules 1 at night for 1 week, Then increase clomipramine to 2 capsules at night and reduce paroxetine to 1-1/2  tablets or 60 mg daily. Wait 1 week and then get the blood test  She is continuing psychotherapy with Dr. Farrel DemarkMitchum  Discussed potential metabolic side effects associated with atypical antipsychotics, as well as potential risk for movement side effects. Advised pt to contact office if movement side effects occur.  She agrees with the plan  TMS was discussed again. What if it goes wrong?    Follow-up in 6 weeks  Meredith Staggersarey Cottle MD, DFAPA  Future Appointments  Date Time Provider Department Center  02/06/2021  3:30 PM Theresia MajorsKendall, Jeffrey, MD [facility] None  02/27/2021  4:00 PM Robley FriesMitchum, Robert, PhD [facility] None  03/15/2021  4:00 PM Cottle, Steva Readyarey G Jr., MD [facility] None  05/15/2021  8:15 AM [facility] None  05/17/2021  8:15 AM  Reather Littler, MD [facility] None    Orders Placed This Encounter  Procedures  . Clomipramine and metabolite, serum      -------------------------------

## 2021-01-17 ENCOUNTER — Other Ambulatory Visit: Payer: Self-pay | Admitting: Psychiatry

## 2021-01-17 NOTE — Telephone Encounter (Signed)
Please review

## 2021-01-25 ENCOUNTER — Other Ambulatory Visit: Payer: Self-pay | Admitting: Psychiatry

## 2021-01-25 DIAGNOSIS — R251 Tremor, unspecified: Secondary | ICD-10-CM

## 2021-01-30 DIAGNOSIS — F422 Mixed obsessional thoughts and acts: Secondary | ICD-10-CM | POA: Diagnosis not present

## 2021-01-30 DIAGNOSIS — F332 Major depressive disorder, recurrent severe without psychotic features: Secondary | ICD-10-CM | POA: Diagnosis not present

## 2021-02-03 LAB — CLOMIPRAMINE AND METABOLITE, SERUM
Clomipramine Lvl: 58 ng/mL — ABNORMAL LOW (ref 70–200)
Norclomipramine,S: 147 ng/mL — ABNORMAL LOW (ref 150–300)
Total (Clo+Norclo): 205 ng/mL — ABNORMAL LOW (ref 220–500)

## 2021-02-05 ENCOUNTER — Other Ambulatory Visit: Payer: Self-pay | Admitting: Psychiatry

## 2021-02-05 DIAGNOSIS — F332 Major depressive disorder, recurrent severe without psychotic features: Secondary | ICD-10-CM

## 2021-02-05 DIAGNOSIS — F422 Mixed obsessional thoughts and acts: Secondary | ICD-10-CM

## 2021-02-05 MED ORDER — FLUVOXAMINE MALEATE 100 MG PO TABS: 100.0000 mg | ORAL_TABLET | Freq: Every day | ORAL | 1 refills | Status: DC

## 2021-02-05 NOTE — Progress Notes (Signed)
Start 1/2 of Luvox 100 mg tablet and reduce paroxetine to 1 and 1/2 tablets daily for 1 week, Then increase Luvox to 1/2 twice daily and reduce paroxetine to 1 daily.  Wait 1 week and repeat blood test.

## 2021-02-06 ENCOUNTER — Encounter: Payer: BC Managed Care – PPO | Admitting: Obstetrics and Gynecology

## 2021-02-08 ENCOUNTER — Other Ambulatory Visit: Payer: Self-pay | Admitting: Psychiatry

## 2021-02-08 DIAGNOSIS — F422 Mixed obsessional thoughts and acts: Secondary | ICD-10-CM

## 2021-02-08 DIAGNOSIS — F332 Major depressive disorder, recurrent severe without psychotic features: Secondary | ICD-10-CM

## 2021-02-08 NOTE — Telephone Encounter (Signed)
I know you are adjusting her dose.

## 2021-02-10 ENCOUNTER — Other Ambulatory Visit: Payer: Self-pay | Admitting: Endocrinology

## 2021-02-19 ENCOUNTER — Ambulatory Visit: Payer: BC Managed Care – PPO | Admitting: Nurse Practitioner

## 2021-02-19 ENCOUNTER — Other Ambulatory Visit: Payer: Self-pay

## 2021-02-19 ENCOUNTER — Encounter: Payer: Self-pay | Admitting: Nurse Practitioner

## 2021-02-19 VITALS — BP 122/78 | Ht 63.0 in | Wt 109.0 lb

## 2021-02-19 DIAGNOSIS — Z01419 Encounter for gynecological examination (general) (routine) without abnormal findings: Secondary | ICD-10-CM | POA: Diagnosis not present

## 2021-02-19 DIAGNOSIS — M810 Age-related osteoporosis without current pathological fracture: Secondary | ICD-10-CM

## 2021-02-19 NOTE — Patient Instructions (Signed)

## 2021-02-19 NOTE — Progress Notes (Signed)
   Ashlee Chavez 16-Jul-1961 [id number]   History:  60 y.o. G0 presents for annual exam. No GYN complaints. Postmenopausal - no HRT, no bleeding. Normal pap and mammogram history. Hypothyroidism and osteoporosis managed by endocrinology.  Gynecologic History Patient's last menstrual period was 11/19/2010.   Contraception/Family planning: post menopausal status  Health Maintenance Last Pap: 02/04/2020. Results were: normal Last mammogram: 12/2020. Results were: normal Last colonoscopy: 2012 Last Dexa: 02/15/2019. Results were: T-score -1.9  Past medical history, past surgical history, family history and social history were all reviewed and documented in the EPIC chart. Single. [facility].   ROS:  A ROS was performed and pertinent positives and negatives are included.  Exam:  Vitals:   02/19/21 1529  BP: 122/78  Weight: 109 lb (49.4 kg)  Height: 5\' 3"  (1.6 m)   Body mass index is 19.31 kg/m.  General appearance:  Normal Thyroid:  Symmetrical, normal in size, without palpable masses or nodularity. Respiratory  Auscultation:  Clear without wheezing or rhonchi Cardiovascular  Auscultation:  Regular rate, without rubs, murmurs or gallops  Edema/varicosities:  Not grossly evident Abdominal  Soft,nontender, without masses, guarding or rebound.  Liver/spleen:  No organomegaly noted  Hernia:  None appreciated  Skin  Inspection:  Grossly normal   Breasts: Examined lying and sitting.   Right: Without masses, retractions, discharge or axillary adenopathy.   Left: Without masses, retractions, discharge or axillary adenopathy. Gentitourinary   Inguinal/mons:  Normal without inguinal adenopathy  External genitalia:  Normal  BUS/Urethra/Skene's glands:  Normal  Vagina: Atrophic changes  Cervix:  Normal  Uterus:  Normal in size, shape and contour.  Midline and mobile  Adnexa/parametria:     Rt: Without masses or tenderness.   Lt: Without masses or tenderness.  Anus  and perineum: Normal  Digital rectal exam: Normal sphincter tone without palpated masses or tenderness  Assessment/Plan:  59 y.o. G0 for annual exam.   Well female exam with routine gynecological exam - Education provided on SBEs, importance of preventative screenings, current guidelines, high calcium diet, regular exercise, and multivitamin daily. Preventative labs done elsewhere.   Age-related osteoporosis without current pathological fracture - Managed by endocrinology. 02/2019 T-score -1.9. On Actonel. Taking daily Vitamin D supplement. Recommend increasing exercise/resistance training.   Screening for cervical cancer - Normal Pap history.  Will repeat at 3-year interval per guidelines.  Screening for breast cancer - Normal mammogram history.  Continue annual screenings.  Normal breast exam today.  Screening for colon cancer - 2012 colonoscopy. Will repeat at GI's recommended interval.   Return in 1 year for annual.    2013 DNP, 3:42 PM 02/19/2021

## 2021-02-20 DIAGNOSIS — F332 Major depressive disorder, recurrent severe without psychotic features: Secondary | ICD-10-CM | POA: Diagnosis not present

## 2021-02-20 DIAGNOSIS — F422 Mixed obsessional thoughts and acts: Secondary | ICD-10-CM | POA: Diagnosis not present

## 2021-02-22 LAB — CLOMIPRAMINE AND METABOLITE, SERUM
Clomipramine Lvl: 195 ng/mL (ref 70–200)
Norclomipramine,S: 111 ng/mL — ABNORMAL LOW (ref 150–300)
Total (Clo+Norclo): 306 ng/mL (ref 220–500)

## 2021-02-26 NOTE — Progress Notes (Signed)
Tell her the current clomipramine level is pretty good.  I would like for her to keep the dosage the same until we have an appointment in about 3 weeks.  She may see a gradual improvement until then.

## 2021-02-27 ENCOUNTER — Ambulatory Visit (INDEPENDENT_AMBULATORY_CARE_PROVIDER_SITE_OTHER): Payer: BC Managed Care – PPO | Admitting: Psychiatry

## 2021-02-27 ENCOUNTER — Other Ambulatory Visit: Payer: Self-pay

## 2021-02-27 DIAGNOSIS — F4010 Social phobia, unspecified: Secondary | ICD-10-CM | POA: Diagnosis not present

## 2021-02-27 DIAGNOSIS — F908 Attention-deficit hyperactivity disorder, other type: Secondary | ICD-10-CM

## 2021-02-27 DIAGNOSIS — F422 Mixed obsessional thoughts and acts: Secondary | ICD-10-CM

## 2021-02-27 DIAGNOSIS — F332 Major depressive disorder, recurrent severe without psychotic features: Secondary | ICD-10-CM | POA: Diagnosis not present

## 2021-02-27 DIAGNOSIS — E638 Other specified nutritional deficiencies: Secondary | ICD-10-CM

## 2021-02-27 DIAGNOSIS — Z9189 Other specified personal risk factors, not elsewhere classified: Secondary | ICD-10-CM

## 2021-02-27 NOTE — Progress Notes (Addendum)
Psychotherapy Progress Note [facility] Ashlee Czar, PhD LP  Patient ID: Ashlee Chavez     MRN: [id number] Therapy format: Individual psychotherapy Date: 02/27/2021      Start: 4:07p     Stop: 4:57p     Time Spent: 50 min Location: In-person   Session narrative (presenting needs, interim history, self-report of stressors and symptoms, applications of prior therapy, status changes, and interventions made in session) Reports still low initiative, low motivation, trouble getting words and ideas to come to mind, and at least feels like she's losing awareness.  Apprehensive and bewildered some about clomipramine and testing.  Going back on it b/c it worked back before all these changes made in meds.  Apparently had low blood level, retested, but no results given to her.  Checked and communicated charted lab results and instructions from Dr. Jennelle Chavez, since retesting.  Layman's perspective, she has apparently established better blood level now, she just didn't know whether she'd succeeded or not.  Unable to determine whether staff had reached her and she forgot, vs. She was not reached and the effort to reach her fell away.  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  Probed whether she happened to repeat the challenge, she did not, and does not want to at this time.  Encouraged to consider it, since repetition really is the key to desensitizing, and seeing it clearly again what the difference is between an allegedly catastrophic mistake and a negligible issue -- or even better, that there is no such thing as the catastrophe she fear (i.e., the sky can not actually fall) -- is key, behaviorally speaking, to making the adjustment and overcoming OCD's tyranny  in her daily life, at least with the threat of work mistakes.  Meanwhile, c/o not remembering to do things in preparation, e.g., remembering that Ashlee Chavez meant holiday pay or caregivers and forgetting to key that in.  Rationally reviewed what actually came of it, and how neither workers nor management were up in arms about it, just went back in and made corrections.  At home, she describes hypervigilance morning and evening about her 14yo cat, who has been throwing up and voiding various places.  Has plastic put down lots of places to try to prevent staining and enable cleaning, but Ashlee Chavez still spends time each morning checking and cleaning up poop, sometimes vomit, and scooping and sweeping up around 4 litter boxes QAM, about 15-45 minutes' work.  Showers last 45 minutes now, when they were under 20 minutes last year, apparently for feeling uneasy about mustering herself for work.  The cat/soiling situation has her awakening before 5am on work days to be sure she has enough time, and going to bed early evening to be sure she has enough rest, and the pattern seems to leave precious little time to rest, work, recreate, go to the gym, or whatever else she might want to make part of living.  Used to be able to get up 6am, only deal with two litter boxes, just once a day.  Showers are also prolonged by feeling she has to be exacting in how she cleans, and just for moving more slowly these days.    Discussed possible changes to routine, is actually warming  up to the possibility of rehoming the elderly cat or putting her down.  Made a vet appointment then cancelled it for feeling it was callous or immoral of her to consider.  Supported is remaking the appointment, in order to better assess the situation, know better if it would actually be a humane act, and share the burden of moral decision-making.  Shared personal story of working through decision to euthanize a much healthier pet with intractable biting, both to  empathize and to reframe the issues at hand.  Offered possibility of referral for veterinary behavioral service, if she feels it might be possible to redirect cat's toileting.  Will inquire if so moved, agrees to see vet.  Nutritionally, knows she is not getting enough protein, still, despite having made space to cook ahead and freeze portions.  Knows she has those available in freezer, just never remembers in time to thaw them.    Encouraged earlier f/u but still feels she must keep to 6 wks or more to control costs.  (Is diverting large amounts to retirement to remain eligible for Ashlee Chavez health insurance.)  Therapeutic modalities: Cognitive Behavioral Therapy, Solution-Oriented/Positive Psychology and Ego-Supportive  Mental Status/Observations:  Appearance:   Casual and Neat     Behavior:  Appropriate  Motor:  some slowing  Speech/Language:   hesitant at times, but more fluent than had been  Affect:  Appropriate and shaken, with subject  Mood:  anxious and depressed  Thought process:  normal  Thought content:    Obsessions  Sensory/Perceptual disturbances:    WNL  Orientation:  Fully oriented  Attention:  Good    Concentration:  Good  Memory:  WNL  Insight:    Good  Judgment:   Fair  Impulse Control:  Good   Risk Assessment: Danger to Self: No Self-injurious Behavior: No Danger to Others: No Physical Aggression / Violence: No Duty to Warn: No Access to Firearms a concern: No  Assessment of progress:  progressing, tentatively  Diagnosis:   ICD-10-CM   1. 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 Mixed obsessional thoughts and acts  F42.2   3. Social anxiety disorder  F40.10   4. 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 At high risk for loneliness  Z91.89   6. Imbalanced nutrition  E63.8    Plan:  . Encourage repeat numerical "mistake" challenge at  work . Consult veterinarian about handling cat's condition and/or standards to euthanize or capacity to rehome . Try to streamline showers -- options to try barreling through, work at once-and-done washing areas, or substitute a "rinse" some days just to economize on time and give herself back some rest.  Idea today to use a timer (phone, kitchen timer) to mark 5- or 10-minute passages, get her own attention into the now well enough to move along. . Improve protein intake -- today, on arrival home, take out one frozen portion of chicken and put it in the fridge to thaw for tomorrow morning's lunch.  Encouraged option to put a reminder sign on the fridge to stop and pull out a frozen portion for the next day. . Will need to find ways to dismantle the excessive time demands of cleaning for cat and self in order to provide possibility of rest, recreation in her life.  Lifestyle and experienced responsibilities are, fundamentally, too many jobs to sustain. . Continue to encourage verifying with  boss as needed that she cannot actually "fail" as she fears in handling the payroll system, that every mistake she could make it tolerable and fixable.  Option to include boss on the notion of making intentional mistakes and fake catastrophes (e.g., $36M paycheck). . Other recommendations/advice as may be noted above . Continue to utilize previously learned skills ad lib . Maintain medication as prescribed and work faithfully with relevant prescriber(s) if any changes are desired or seem indicated . Call the clinic on-call service, present to ER, or call 911 if any life-threatening psychiatric crisis Return recommend 2 wks; declines in favor of 6wks d/t cost. . Already scheduled visit in this office 03/15/2021.  Robley Fries, PhD Ashlee Czar, PhD LP Clinical Psychologist, [facility] [address] [place], [place] [address] [phone]

## 2021-02-28 ENCOUNTER — Other Ambulatory Visit: Payer: Self-pay | Admitting: Psychiatry

## 2021-02-28 DIAGNOSIS — F422 Mixed obsessional thoughts and acts: Secondary | ICD-10-CM

## 2021-02-28 DIAGNOSIS — F332 Major depressive disorder, recurrent severe without psychotic features: Secondary | ICD-10-CM

## 2021-03-09 ENCOUNTER — Other Ambulatory Visit: Payer: Self-pay

## 2021-03-09 DIAGNOSIS — F900 Attention-deficit hyperactivity disorder, predominantly inattentive type: Secondary | ICD-10-CM

## 2021-03-09 MED ORDER — METHYLPHENIDATE HCL ER (OSM) 36 MG PO TBCR: 72.0000 mg | EXTENDED_RELEASE_TABLET | Freq: Every day | ORAL | 0 refills | Status: DC

## 2021-03-09 NOTE — Telephone Encounter (Signed)
Script sent  

## 2021-03-14 ENCOUNTER — Other Ambulatory Visit: Payer: Self-pay | Admitting: Psychiatry

## 2021-03-14 DIAGNOSIS — F422 Mixed obsessional thoughts and acts: Secondary | ICD-10-CM

## 2021-03-14 DIAGNOSIS — F332 Major depressive disorder, recurrent severe without psychotic features: Secondary | ICD-10-CM

## 2021-03-15 ENCOUNTER — Encounter: Payer: Self-pay | Admitting: Psychiatry

## 2021-03-15 ENCOUNTER — Ambulatory Visit (INDEPENDENT_AMBULATORY_CARE_PROVIDER_SITE_OTHER): Payer: BC Managed Care – PPO | Admitting: Psychiatry

## 2021-03-15 ENCOUNTER — Other Ambulatory Visit: Payer: Self-pay

## 2021-03-15 VITALS — BP 94/63 | HR 87

## 2021-03-15 DIAGNOSIS — E638 Other specified nutritional deficiencies: Secondary | ICD-10-CM

## 2021-03-15 DIAGNOSIS — F332 Major depressive disorder, recurrent severe without psychotic features: Secondary | ICD-10-CM

## 2021-03-15 DIAGNOSIS — F908 Attention-deficit hyperactivity disorder, other type: Secondary | ICD-10-CM

## 2021-03-15 DIAGNOSIS — F422 Mixed obsessional thoughts and acts: Secondary | ICD-10-CM

## 2021-03-15 DIAGNOSIS — F4010 Social phobia, unspecified: Secondary | ICD-10-CM

## 2021-03-15 DIAGNOSIS — F5102 Adjustment insomnia: Secondary | ICD-10-CM

## 2021-03-15 DIAGNOSIS — R251 Tremor, unspecified: Secondary | ICD-10-CM

## 2021-03-15 MED ORDER — OLANZAPINE 5 MG PO TABS: 5.0000 mg | ORAL_TABLET | Freq: Every day | ORAL | 1 refills | Status: DC

## 2021-03-15 MED ORDER — CLOMIPRAMINE HCL 25 MG PO CAPS: 50.0000 mg | ORAL_CAPSULE | Freq: Every day | ORAL | 1 refills | Status: DC

## 2021-03-15 NOTE — Patient Instructions (Addendum)
Add olanzapine 5 mg tablet 1 at night to help sleep, appetite, anxiety, and depression Stop mirtazapine Reduce risperidone to 1/2 tablet for 1 week then stop it. Call if any symptoms worsen

## 2021-03-15 NOTE — Progress Notes (Signed)
Ashlee Chavez [id number] 05/22/1961 60 y.o.  Subjective:   Patient ID:  Ashlee Chavez is a 60 y.o. (DOB 1961/07/29) female.  Chief Complaint:  Chief Complaint  Patient presents with  . Follow-up  . 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  Associated symptoms include no decreased concentration, no fatigue and no suicidal ideas.  Ashlee Chavez presents today for follow-up of recently worse depression DT relationship loss.    When seen February 10, 2019.  She had received some benefit from Ritalin 20 mg 3 times daily but was having issues with the crash.  We switched to Concerta 72 mg every morning.  September 14, 2019.  She was having persistent bothersome OCD and was interested in further medicine changes to try to help with these symptoms because she has aggressively pursued therapy and still has significant symptoms.  She has been on multiple SSRIs and so we initiated augmentation with risperidone half milligram daily to increase to 1 mg daily. Sig improvement with risperidone.  SE low appetite with it.  Not hungry for dinner. Sleep so much better also.  Improvement is "marked".  Think better bc less obsessing time.  Not constant now.  Noticed benefit within the first week.  Less obsessing lessened depression.  But residual symptoms.  visit October 14, 2019.  She was encouraged to experiment with the dosing of the risperidone up to 2 mg or 3 mg nightly to see if she could get additional reduction in her chronic anxiety and OCD.   seen December 15, 2019.  Settled at 2 mg risperidone bc increased sleep.  Sleep is so much better.  Harder to get up in the morning.   Found out insurance won't cover Concerta anymore.  Not as good on Ritalin TID.  Not as even a response.  Terrible insurance. Therefore the only med change with switching to Focalin to see if it would be covered better.  Appt  03/09/20, switch to Concerta 72 mg is better with better mood and motivation vs Focalin.  Focus is also better.  Very poor function with Focalin and better with Concerta.   Better overall with more even response to stimulant and better appetite.  No SE.  Duration all day.  Less desperate hopeless feelings on the stimulant.  Stressful times. With increase risperidone a little better mood bump.  SE a little shakiness noticed in jaw.   Not unusual levels of anxiety.  Lost her best friend 1 year ago and still grieving.  Not much family. Alone.  Not close to B and S who live elsewhere.  Talks to God daily.  Gets to work daily.  Function is good there.  Reads and calls people.  OCD no worse with Covid. Did join a Bible study which was a huge step. Plan start lamotrigine   04/04/20 appt with the following noted: Appt moved up.  She thought she was supposed to stop risperidone when started lamotrigine.  Started having SI off the risperidone but wasn't sure if it was SE.  No risperidone 3 mg for 3 weeks.Anxiety shaking sweating crying all started right away.  Also trouble with sleep and can't eat.  Plan:Restart risperidone 1 mg now and another milligram in 2 to 3 hours if not feeling sufficient relief to get the full dose of 3 mg today.  Then resume 3 mg risperidone each evening. Restart lamotrigine 25  mg daily for 2 weeks and follow the previous instructions on increasing it.  05/11/2020 appointment the following is noted: Marked improvement back on risperidone 3 mg.  SE shakes jaw.  Doesn't affect typing. Started lamotrigine and up to 100 mg for 2 weeks.  Feels it's helped depression. Better mood and able to laugh.  Able to get pizza for Bible study and not cry. Apptetite is not great.  Working on it. Anxiety is better not gone. Plan: no med changes  07/26/20 appt with the following noted; Increased lamotrigine gradually to 200 mg daily over the phone 6 weeks ago.  No improvement really for depression. With  it. CC depression.   Risperidone still helping anxiety. Plan: Increase lamotrigine to 300 mg daily..  09/14/2020 appointment with following noted: Started taper of lamotrigine DT NR. Started latuda 20 mg daily for 10 days.  Tolerated fine.  No effects so far.. Main problem is still depression.  Anxiety is not too bad.  No energy, motivation, sleeeping a lot.  No sig SI. In past started risperidone for anxiety and depression.  Increase Latuda to 40 mg daily. Disc potential DDI with risperidone reducing effectiveness of  Latuda so reduc risperidone to 1.5 mg HS  10/09/2020 phone call from patient: Patient left a voicemail on Friday while we were out of office for the Holiday that she is not any better. She feels the cutting in the 1/2 of Risperidone has really increased her anxiety. She was instructed to call back if not any better but concerned that taking away the Risperidone will really exacerbate her anxiety.  MD response:  At the last appointment we increased Latuda to 40 mg daily and reduced risperidone to half tablet daily.  The goal was to use Latuda and eliminate risperidone.  However since her anxiety has worsened with the reduction in risperidone, I agree with her and she should take the full dose of risperidone as well as continue the Latuda 40 mg daily.  The Kasandra KnudsenLatuda is intended to help depression.  11/23/2020 appointment with the following noted: Propranolol only once daily. No better with higher Latuda.  No SE either. No change in appetite which is poor.  Still struggling with depression and OCD is a little worse with changes at work. Off lamotrigine. Plan: 5-HTP supplement 200 mg daily Increase Latuda to 60 mg  for 3 weeks, If no benefit and no SE increase to 80 mg daily with food. ADD still better with Concerta 72 mg versus Focalin. Continue high dosage paroxetine 80 mg daily.  01/16/2021 appointment with following noted: Increase Latuda to 80 mg without benefit or SE.   Anxiety and depression with poor STM.  Food getting caught in her throat.  No change in eating.  No change in weight.   Anxious thoughts mostly about work even when not there.  Anxious about work needed on the house she can't afford.  Enjoys reading but feels guilty that she should be doing something else.  More general anxiety than OCD. 5-HTP supplement 200 mg daily Wean Latuda over a week DT NR Reduce mirtazapine to 30 mg HS  ADD still better with Concerta 72 mg versus Focalin. Start clomipramine 25 mg capsules 1 at night for 1 week, Then increase clomipramine to 2 capsules at night and reduce paroxetine to 1-1/2 tablets or 60 mg daily. Wait 1 week and then get the blood test   03/15/2021 appt noted: OK not great.  About the same.  Not much change in the  last 8 weeks. SE none Lab 02/20/21 clomipramine 50 + Luvox 100 = clomip 195 + norclomip 111= total 306 Losing weight and no appetite and hard to eat. Weight loss to 107#. A lot of anxiety and depression   Sleep less sound with less mirtazapine.    [name] and describes severe..  Anxiety symptoms include: Excessive Worry, Obsessive Compulsive Symptoms:   Handwashing,,.  Pt reports no sleep issues, it's better 7-8 hours. Pt reports that appetite is good. Pt reports that energy is good and down slightly. Concentration is no change. Suicidal thoughts:  denied by patient.  Numerous failed psychiatric medication trials include Lexapro 60 mg a day, Zoloft 250, Paxil with weight gain,   Clomipramine,  Wellbutrin 300 with side effects,  mirtazapine Rexulti which helped initially,  Vraylar, Abilify between 1 and 2.5 mg and up to 15 mg with no response,  Latuda 80 failed risperidone Lamotrigine 300 NR buspirone 30 mg,  Cytomel,  lithium 625,  pramipexole with insomnia,   Lunesta with crying spells,  topiramate, Deplin, propranolol,   Ritalin, Focalin NR, Concerta 72 helped No history of Prozac,  Viibryd.  Review of Systems:  Review of Systems  Constitutional: Positive for appetite change. Negative for diaphoresis, fatigue and unexpected weight change.  Cardiovascular: Negative for chest pain and palpitations.  Gastrointestinal: Negative for abdominal pain and nausea.  Neurological: Positive for tremors and headaches. Negative for dizziness, weakness and light-headedness.       Mouth  Psychiatric/Behavioral: Positive for depression. Negative for agitation, behavioral problems, confusion, decreased concentration, hallucinations, self-injury, sleep disturbance and suicidal ideas. The patient is not hyperactive.     Medications: I have reviewed the patient's current medications.  Current Outpatient Medications  Medication Sig Dispense Refill  . ALPRAZolam (XANAX) 0.25 MG tablet Take 1 tablet (0.25 mg total) by mouth daily as needed for anxiety. 30 tablet 0  . atorvastatin (LIPITOR) 20 MG tablet TAKE ONE TABLET BY MOUTH ONCE DAILY 90 tablet 1  . Calcium Carbonate-Vitamin D (OSCAL 500/200 D-3 PO) Take by mouth.    . Cholecalciferol (VITAMIN D PO) Take by mouth.    . clomiPRAMINE (ANAFRANIL) 25 MG capsule 1 at night for 1 week then 2 at night (Patient taking differently: Take 50 mg by mouth at bedtime.) 60 capsule 1  . fluvoxaMINE (LUVOX) 100 MG tablet TAKE 1 TABLET BY MOUTH EVERYDAY AT BEDTIME 90 tablet 0  . levothyroxine (SYNTHROID) 100 MCG tablet TAKE 1 TABLET BY MOUTH EVERY DAY 90 tablet 1  . methylphenidate (CONCERTA) 36 MG PO CR tablet Take 2 tablets (72 mg total) by mouth daily. 60 tablet 0  . mirtazapine (REMERON) 30 MG tablet TAKE 2 TABLETS BY MOUTH AT BEDTIME 180 tablet 1  . propranolol (INDERAL) 20 MG tablet TAKE 1 TO 2 TABLETS TWICE A DAY AS NEEDED FOR TREMOR 100 tablet 3  . risedronate (ACTONEL) 150 MG tablet TAKE 1 TABLET EVERY 30 DAYS WITH WATER ON EMPTY STOMACH, NOTHING BY MOUTH OR LIE DOWN FOR 3 tablet 1  . risperiDONE (RISPERDAL) 3 MG tablet TAKE 1 TABLET (3 MG  TOTAL) BY MOUTH AT BEDTIME. 90 tablet 1  . SUMAtriptan (IMITREX) 100 MG tablet Take 100 mg by mouth daily as needed for migraine.    . topiramate (TOPAMAX) 100 MG tablet TAKE 1 TABLET BY MOUTH TWICE A DAY 180 tablet 0   No current facility-administered medications for this visit.    Medication Side Effects:  Watch jaw  tremor  Allergies:  Allergies  Allergen Reactions  . Ampicillin Rash    Past Medical History:  Diagnosis Date  . Gum lesion    Biopsy done-benign  . Hypercholesteremia   . Hypothyroid   . LGSIL (low grade squamous intraepithelial dysplasia) 2010  . Migraines   . OCD (obsessive compulsive disorder)   . Osteoporosis 01/2016   T score -2.7    Family History  Problem Relation Age of Onset  . Hypertension Mother   . Heart disease Mother   . Hypertension Father   . Breast cancer Paternal Aunt        Age 51  . Diabetes Neg Hx     Social History   Socioeconomic History  . Marital status: Single    Spouse name: Not on file  . Number of children: Not on file  . Years of education: Not on file  . Highest education level: Not on file  Occupational History  . Not on file  Tobacco Use  . Smoking status: Never Smoker  . Smokeless tobacco: Never Used  Vaping Use  . Vaping Use: Never used  Substance and Sexual Activity  . Alcohol use: Yes    Alcohol/week: 0.0 standard drinks    Comment: wine occassionally  . Drug use: No  . Sexual activity: Not Currently    Birth control/protection: Post-menopausal    Comment: 1st intercourse 1 yo-5 partners  Other Topics Concern  . Not on file  Social History Narrative  . Not on file   Social Determinants of Health   Financial Resource Strain: Not on file  Food Insecurity: Not on file  Transportation Needs: Not on file  Physical Activity: Not on file  Stress: Not on file  Social Connections: Not on file  Intimate Partner Violence: Not on file    Past Medical History, Surgical history, Social history, and  Family history were reviewed and updated as appropriate.   Please see review of systems for further details on the patient's review from today.   Objective:   Physical Exam:  BP 94/63   Pulse 87   LMP 11/19/2010   Physical Exam Constitutional:      General: She is not in acute distress.    Appearance: Normal appearance. She is well-developed.  Musculoskeletal:        General: No deformity.  Neurological:     Mental Status: She is alert and oriented to person, place, and time.     Coordination: Coordination normal.  Psychiatric:        Attention and Perception: She is attentive. She does not perceive auditory hallucinations.        Mood and Affect: Mood is anxious and depressed. Affect is not labile, blunt, angry or inappropriate.        Speech: Speech is delayed.        Behavior: Behavior is slowed.        Thought Content: Thought content is not paranoid. Thought content does not include homicidal or suicidal ideation. Thought content does not include homicidal or suicidal plan.        Cognition and Memory: Cognition normal.        Judgment: Judgment normal.     Comments: Insight intact. No auditory or visual hallucinations. No delusions.  More OCD with job changes triggered more checking. Severe sx. SI resolved on risperidone       Lab Review:     Component Value Date/Time   NA 139 11/14/2020 0804   K  4.0 11/14/2020 0804   CL 104 11/14/2020 0804   CO2 27 11/14/2020 0804   GLUCOSE 96 11/14/2020 0804   BUN 15 11/14/2020 0804   CREATININE 0.82 11/14/2020 0804   CALCIUM 9.8 11/14/2020 0804   PROT 6.9 11/14/2020 0804   ALBUMIN 4.6 11/14/2020 0804   AST 19 11/14/2020 0804   ALT 19 11/14/2020 0804   ALKPHOS 41 11/14/2020 0804   BILITOT 0.4 11/14/2020 0804       Component Value Date/Time   WBC 5.9 01/29/2016 0834   RBC 4.85 01/29/2016 0834   HGB 15.2 (H) 01/29/2016 0834   HCT 46.2 (H) 01/29/2016 0834   PLT 232.0 01/29/2016 0834   MCV 95.3 01/29/2016 0834    MCHC 33.0 01/29/2016 0834   RDW 14.7 01/29/2016 0834   LYMPHSABS 1.3 01/29/2016 0834   MONOABS 0.4 01/29/2016 0834   EOSABS 0.3 01/29/2016 0834   BASOSABS 0.0 01/29/2016 0834    No results found for: POCLITH, LITHIUM   No results found for: PHENYTOIN, PHENOBARB, VALPROATE, CBMZ   .res Assessment: Plan:    Aleiah was seen today for follow-up and major depressive disorder, recurrent severe without psychot.  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 she has a long list of failed psychiatric meds.  She has not tended to do well with higher dosages of SSRIs. She has chronic residual anxiety which is significant. Patient is markedly depressed and anxious.  She is afraid of doing TMS.  She knows her fears are exaggerated and irrational.  Chart review revealed that prior to starting treatment here patient had taken clomipramine in the past with some benefit but also with some side effects.  It is a tricyclic antidepressant distinctly different from SSRIs.  Some patients respond better to them.  She has a history of previous benefit.  It makes sense to retry that medication.  We discussed the pros and cons of combining it with Luvox but just not appropriate while she is on paroxetine.  We discussed the value of doing blood levels with tricyclic antidepressants like clomipramine.  She agrees to retry that medication.  5-HTP supplement 200 mg daily  Add olanzapine 5 mg tablet 1 at night Stop mirtazapine Reduce risperidone to 1/2 tablet for 1 week then stop it.  Call  if SI recurs.  ADD still better with Concerta 72 mg versus Focalin. . . Continue Luvox 100 + clomipramine 50 mg for another 6 weeks.  Lab 02/20/21 clomipramine 50 + Luvox 100 = clomip 195 + norclomip 111= total 306  She is continuing psychotherapy with Dr. 04/22/21  Discussed potential metabolic side effects associated with atypical antipsychotics, as well as potential risk for movement side effects. Advised pt to contact office if movement side effects occur.  She agrees with the plan  TMS was discussed again. What if it goes wrong?    Follow-up in 2 weeks DT severity of sx.  Farrel Demark MD, DFAPA  Future Appointments  Date Time Provider Department Center  04/17/2021  4:00 PM 06/17/2021, PhD [facility] None  05/15/2021  8:15 AM [facility] None  05/17/2021  8:15 AM Reather Littler, MD [facility] None  02/20/2022  3:30 PM Olivia Mackie, NP [facility] None    No orders of the defined types were placed in this encounter.     -------------------------------

## 2021-03-21 ENCOUNTER — Other Ambulatory Visit: Payer: Self-pay

## 2021-03-21 MED ORDER — TOPIRAMATE 100 MG PO TABS: 100.0000 mg | ORAL_TABLET | Freq: Two times a day (BID) | ORAL | 0 refills | Status: DC

## 2021-03-30 ENCOUNTER — Ambulatory Visit (INDEPENDENT_AMBULATORY_CARE_PROVIDER_SITE_OTHER): Payer: BC Managed Care – PPO | Admitting: Psychiatry

## 2021-03-30 ENCOUNTER — Encounter: Payer: Self-pay | Admitting: Psychiatry

## 2021-03-30 ENCOUNTER — Other Ambulatory Visit: Payer: Self-pay

## 2021-03-30 VITALS — BP 87/58 | HR 68

## 2021-03-30 DIAGNOSIS — F332 Major depressive disorder, recurrent severe without psychotic features: Secondary | ICD-10-CM

## 2021-03-30 DIAGNOSIS — E638 Other specified nutritional deficiencies: Secondary | ICD-10-CM

## 2021-03-30 DIAGNOSIS — F422 Mixed obsessional thoughts and acts: Secondary | ICD-10-CM | POA: Diagnosis not present

## 2021-03-30 DIAGNOSIS — F4010 Social phobia, unspecified: Secondary | ICD-10-CM | POA: Diagnosis not present

## 2021-03-30 DIAGNOSIS — R251 Tremor, unspecified: Secondary | ICD-10-CM

## 2021-03-30 DIAGNOSIS — F908 Attention-deficit hyperactivity disorder, other type: Secondary | ICD-10-CM

## 2021-03-30 DIAGNOSIS — F5102 Adjustment insomnia: Secondary | ICD-10-CM

## 2021-03-30 MED ORDER — OLANZAPINE 10 MG PO TABS: 10.0000 mg | ORAL_TABLET | Freq: Every day | ORAL | 0 refills | Status: DC

## 2021-03-30 NOTE — Patient Instructions (Signed)
Increase olanzapine to 10 mg in the evening.  Call in 2 weeks if you are not seeing progressive improvement

## 2021-03-30 NOTE — Progress Notes (Signed)
Ashlee Chavez [id number] Jan 04, 1961 60 y.o.  Subjective:   Patient ID:  Ashlee Chavez is a 60 y.o. (DOB Mar 31, 1961) female.  Chief Complaint:  Chief Complaint  Patient presents with  . Follow-up  . 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 Anxiety    Depression        Associated symptoms include appetite change and headaches.  Associated symptoms include no decreased concentration, no fatigue and no suicidal ideas.  Ashlee Chavez presents today for follow-up of recently worse depression DT relationship loss.    When seen February 10, 2019.  She had received some benefit from Ritalin 20 mg 3 times daily but was having issues with the crash.  We switched to Concerta 72 mg every morning.  September 14, 2019.  She was having persistent bothersome OCD and was interested in further medicine changes to try to help with these symptoms because she has aggressively pursued therapy and still has significant symptoms.  She has been on multiple SSRIs and so we initiated augmentation with risperidone half milligram daily to increase to 1 mg daily. Sig improvement with risperidone.  SE low appetite with it.  Not hungry for dinner. Sleep so much better also.  Improvement is "marked".  Think better bc less obsessing time.  Not constant now.  Noticed benefit within the first week.  Less obsessing lessened depression.  But residual symptoms.  visit October 14, 2019.  She was encouraged to experiment with the dosing of the risperidone up to 2 mg or 3 mg nightly to see if she could get additional reduction in her chronic anxiety and OCD.   seen December 15, 2019.  Settled at 2 mg risperidone bc increased sleep.  Sleep is so much better.  Harder to get up in the morning.   Found out insurance won't cover Concerta anymore.  Not as good on Ritalin TID.  Not as even a response.  Terrible insurance. Therefore the only med change with switching to Focalin to see if it would be covered  better.  Appt 03/09/20, switch to Concerta 72 mg is better with better mood and motivation vs Focalin.  Focus is also better.  Very poor function with Focalin and better with Concerta.   Better overall with more even response to stimulant and better appetite.  No SE.  Duration all day.  Less desperate hopeless feelings on the stimulant.  Stressful times. With increase risperidone a little better mood bump.  SE a little shakiness noticed in jaw.   Not unusual levels of anxiety.  Lost her best friend 1 year ago and still grieving.  Not much family. Alone.  Not close to B and S who live elsewhere.  Talks to God daily.  Gets to work daily.  Function is good there.  Reads and calls people.  OCD no worse with Covid. Did join a Bible study which was a huge step. Plan start lamotrigine   04/04/20 appt with the following noted: Appt moved up.  She thought she was supposed to stop risperidone when started lamotrigine.  Started having SI off the risperidone but wasn't sure if it was SE.  No risperidone 3 mg for 3 weeks.Anxiety shaking sweating crying all started right away.  Also trouble with sleep and can't eat.  Plan:Restart risperidone 1 mg now and another milligram in 2 to 3 hours if not feeling sufficient relief to get the full dose of 3 mg today.  Then resume 3 mg risperidone each evening.  Restart lamotrigine 25 mg daily for 2 weeks and follow the previous instructions on increasing it.  05/11/2020 appointment the following is noted: Marked improvement back on risperidone 3 mg.  SE shakes jaw.  Doesn't affect typing. Started lamotrigine and up to 100 mg for 2 weeks.  Feels it's helped depression. Better mood and able to laugh.  Able to get pizza for Bible study and not cry. Apptetite is not great.  Working on it. Anxiety is better not gone. Plan: no med changes  07/26/20 appt with the following noted; Increased lamotrigine gradually to 200 mg daily over the phone 6 weeks ago.  No improvement really for  depression. With it. CC depression.   Risperidone still helping anxiety. Plan: Increase lamotrigine to 300 mg daily..  09/14/2020 appointment with following noted: Started taper of lamotrigine DT NR. Started latuda 20 mg daily for 10 days.  Tolerated fine.  No effects so far.. Main problem is still depression.  Anxiety is not too bad.  No energy, motivation, sleeeping a lot.  No sig SI. In past started risperidone for anxiety and depression.  Increase Latuda to 40 mg daily. Disc potential DDI with risperidone reducing effectiveness of  Latuda so reduc risperidone to 1.5 mg HS  10/09/2020 phone call from patient: Patient left a voicemail on Friday while we were out of office for the Holiday that she is not any better. She feels the cutting in the 1/2 of Risperidone has really increased her anxiety. She was instructed to call back if not any better but concerned that taking away the Risperidone will really exacerbate her anxiety.  MD response:  At the last appointment we increased Latuda to 40 mg daily and reduced risperidone to half tablet daily.  The goal was to use Latuda and eliminate risperidone.  However since her anxiety has worsened with the reduction in risperidone, I agree with her and she should take the full dose of risperidone as well as continue the Latuda 40 mg daily.  The Kasandra KnudsenLatuda is intended to help depression.  11/23/2020 appointment with the following noted: Propranolol only once daily. No better with higher Latuda.  No SE either. No change in appetite which is poor.  Still struggling with depression and OCD is a little worse with changes at work. Off lamotrigine. Plan: 5-HTP supplement 200 mg daily Increase Latuda to 60 mg  for 3 weeks, If no benefit and no SE increase to 80 mg daily with food. ADD still better with Concerta 72 mg versus Focalin. Continue high dosage paroxetine 80 mg daily.  01/16/2021 appointment with following noted: Increase Latuda to 80 mg without  benefit or SE.  Anxiety and depression with poor STM.  Food getting caught in her throat.  No change in eating.  No change in weight.   Anxious thoughts mostly about work even when not there.  Anxious about work needed on the house she can't afford.  Enjoys reading but feels guilty that she should be doing something else.  More general anxiety than OCD. 5-HTP supplement 200 mg daily Wean Latuda over a week DT NR Reduce mirtazapine to 30 mg HS  ADD still better with Concerta 72 mg versus Focalin. Start clomipramine 25 mg capsules 1 at night for 1 week, Then increase clomipramine to 2 capsules at night and reduce paroxetine to 1-1/2 tablets or 60 mg daily. Wait 1 week and then get the blood test   03/15/2021 appt noted: OK not great.  About the same.  Not much  change in the last 8 weeks. SE none Lab 02/20/21 clomipramine 50 + Luvox 100 = clomip 195 + norclomip 111= total 306 Losing weight and no appetite and hard to eat. Weight loss to 107#. A lot of anxiety and depression   Sleep less sound with less mirtazapine.  Plan: Due to the severity of symptoms,Add olanzapine 5 mg tablet 1 at night Stop mirtazapine Reduce risperidone to 1/2 tablet for 1 week then stop it.  Call if SI recurs.  03/30/2021 appointment with the following noted: It makes me a little tired but so far anxiety is improved.  No problems off risperidone.  Appetite is not better but did get protein bars and is eating.   Less panic.  But still occurs.  Things just started this week to be a little better.  Not huge but it's a start.  A little more energy. Sleep 10 hours but that's unchanged with olanzapine. First time I've had hope in a long time.  [name] and describes severe..  Anxiety symptoms include: Excessive Worry, Obsessive Compulsive Symptoms:   Handwashing,,.  Pt reports no sleep issues. Pt reports that appetite is good. Pt reports that energy is good and down slightly. Concentration  is no change. Suicidal thoughts:  denied by patient.  Numerous failed psychiatric medication trials include Lexapro 60 mg a day, Zoloft 250, Paxil with weight gain,   Clomipramine,  Wellbutrin 300 with side effects,  mirtazapine Rexulti which helped initially,  Vraylar, Abilify between 1 and 2.5 mg and up to 15 mg with no response,  Latuda 80 failed risperidone Lamotrigine 300 NR buspirone 30 mg,  Cytomel,  lithium 625,  pramipexole with insomnia,   Lunesta with crying spells,  topiramate, Deplin, propranolol,   Ritalin, Focalin NR, Concerta 72 helped No history of Prozac, Viibryd.  Review of Systems:  Review of Systems  Constitutional: Positive for appetite change. Negative for diaphoresis, fatigue and unexpected weight change.  Cardiovascular: Negative for chest pain and palpitations.  Gastrointestinal: Negative for abdominal pain.  Neurological: Positive for tremors and headaches. Negative for dizziness, weakness and light-headedness.       Mouth  Psychiatric/Behavioral: Positive for depression. Negative for agitation, behavioral problems, confusion, decreased concentration, hallucinations, self-injury, sleep disturbance and suicidal ideas. The patient is not hyperactive.     Medications: I have reviewed the patient's current medications.  Current Outpatient Medications  Medication Sig Dispense Refill  . ALPRAZolam (XANAX) 0.25 MG tablet Take 1 tablet (0.25 mg total) by mouth daily as needed for anxiety. 30 tablet 0  . atorvastatin (LIPITOR) 20 MG tablet TAKE ONE TABLET BY MOUTH ONCE DAILY 90 tablet 1  . Calcium Carbonate-Vitamin D (OSCAL 500/200 D-3 PO) Take by mouth.    . Cholecalciferol (VITAMIN D PO) Take by mouth.    . clomiPRAMINE (ANAFRANIL) 25 MG capsule Take 2 capsules (50 mg total) by mouth at bedtime. 60 capsule 1  . fluvoxaMINE (LUVOX) 100 MG tablet TAKE 1 TABLET BY MOUTH EVERYDAY AT BEDTIME 90 tablet 0  . levothyroxine (SYNTHROID) 100 MCG tablet TAKE 1 TABLET  BY MOUTH EVERY DAY 90 tablet 1  . methylphenidate (CONCERTA) 36 MG PO CR tablet Take 2 tablets (72 mg total) by mouth daily. 60 tablet 0  . propranolol (INDERAL) 20 MG tablet TAKE 1 TO 2 TABLETS TWICE A DAY AS NEEDED FOR TREMOR 100 tablet 3  . risedronate (ACTONEL) 150 MG tablet TAKE 1 TABLET EVERY 30 DAYS WITH WATER ON EMPTY STOMACH,  NOTHING BY MOUTH OR LIE DOWN FOR 3 tablet 1  . SUMAtriptan (IMITREX) 100 MG tablet Take 100 mg by mouth daily as needed for migraine.    . topiramate (TOPAMAX) 100 MG tablet Take 1 tablet (100 mg total) by mouth 2 (two) times daily. 180 tablet 0  . OLANZapine (ZYPREXA) 10 MG tablet Take 1 tablet (10 mg total) by mouth at bedtime. 30 tablet 0   No current facility-administered medications for this visit.    Medication Side Effects:  Watch jaw tremor  Allergies:  Allergies  Allergen Reactions  . Ampicillin Rash    Past Medical History:  Diagnosis Date  . Gum lesion    Biopsy done-benign  . Hypercholesteremia   . Hypothyroid   . LGSIL (low grade squamous intraepithelial dysplasia) 2010  . Migraines   . OCD (obsessive compulsive disorder)   . Osteoporosis 01/2016   T score -2.7    Family History  Problem Relation Age of Onset  . Hypertension Mother   . Heart disease Mother   . Hypertension Father   . Breast cancer Paternal Aunt        Age 11  . Diabetes Neg Hx     Social History   Socioeconomic History  . Marital status: Single    Spouse name: Not on file  . Number of children: Not on file  . Years of education: Not on file  . Highest education level: Not on file  Occupational History  . Not on file  Tobacco Use  . Smoking status: Never Smoker  . Smokeless tobacco: Never Used  Vaping Use  . Vaping Use: Never used  Substance and Sexual Activity  . Alcohol use: Yes    Alcohol/week: 0.0 standard drinks    Comment: wine occassionally  . Drug use: No  . Sexual activity: Not Currently    Birth control/protection: Post-menopausal     Comment: 1st intercourse 49 yo-5 partners  Other Topics Concern  . Not on file  Social History Narrative  . Not on file   Social Determinants of Health   Financial Resource Strain: Not on file  Food Insecurity: Not on file  Transportation Needs: Not on file  Physical Activity: Not on file  Stress: Not on file  Social Connections: Not on file  Intimate Partner Violence: Not on file    Past Medical History, Surgical history, Social history, and Family history were reviewed and updated as appropriate.   Please see review of systems for further details on the patient's review from today.   Objective:   Physical Exam:  BP (!) 87/58   Pulse 68   LMP 11/19/2010   Physical Exam Constitutional:      General: She is not in acute distress.    Appearance: Normal appearance. She is well-developed.  Musculoskeletal:        General: No deformity.  Neurological:     Mental Status: She is alert and oriented to person, place, and time.     Coordination: Coordination normal.  Psychiatric:        Attention and Perception: She is attentive. She does not perceive auditory hallucinations.        Mood and Affect: Mood is anxious and depressed. Affect is not labile, blunt, angry or inappropriate.        Speech: Speech is delayed.        Behavior: Behavior is slowed.        Thought Content: Thought content is not paranoid. Thought content  does not include homicidal or suicidal ideation. Thought content does not include homicidal or suicidal plan.        Cognition and Memory: Cognition normal.        Judgment: Judgment normal.     Comments: Insight intact. No auditory or visual hallucinations. No delusions.  More OCD with job changes triggered more checking. Severe sx a little better     Lab Review:     Component Value Date/Time   NA 139 11/14/2020 0804   K 4.0 11/14/2020 0804   CL 104 11/14/2020 0804   CO2 27 11/14/2020 0804   GLUCOSE 96 11/14/2020 0804   BUN 15 11/14/2020 0804    CREATININE 0.82 11/14/2020 0804   CALCIUM 9.8 11/14/2020 0804   PROT 6.9 11/14/2020 0804   ALBUMIN 4.6 11/14/2020 0804   AST 19 11/14/2020 0804   ALT 19 11/14/2020 0804   ALKPHOS 41 11/14/2020 0804   BILITOT 0.4 11/14/2020 0804       Component Value Date/Time   WBC 5.9 01/29/2016 0834   RBC 4.85 01/29/2016 0834   HGB 15.2 (H) 01/29/2016 0834   HCT 46.2 (H) 01/29/2016 0834   PLT 232.0 01/29/2016 0834   MCV 95.3 01/29/2016 0834   MCHC 33.0 01/29/2016 0834   RDW 14.7 01/29/2016 0834   LYMPHSABS 1.3 01/29/2016 0834   MONOABS 0.4 01/29/2016 0834   EOSABS 0.3 01/29/2016 0834   BASOSABS 0.0 01/29/2016 0834    No results found for: POCLITH, LITHIUM   No results found for: PHENYTOIN, PHENOBARB, VALPROATE, CBMZ   .res Assessment: Plan:    Ashlee Chavez was seen today for follow-up, major depressive disorder, recurrent severe without psychot and anxiety.  Diagnoses and all orders for this visit:  Major depressive disorder, recurrent severe without psychotic features (HCC) -     OLANZapine (ZYPREXA) 10 MG tablet; Take 1 tablet (10 mg total) by mouth at bedtime.  Mixed obsessional thoughts and acts -     OLANZapine (ZYPREXA) 10 MG tablet; Take 1 tablet (10 mg total) by mouth at bedtime.  Social anxiety disorder -     OLANZapine (ZYPREXA) 10 MG tablet; Take 1 tablet (10 mg total) by mouth at bedtime.  Attention deficit hyperactivity disorder (ADHD), other type - secondary to anxiety and depression  Tremor of face and hands  Insomnia due to psychological stress  Imbalanced nutrition -     OLANZapine (ZYPREXA) 10 MG tablet; Take 1 tablet (10 mg total) by mouth at bedtime.   We discussed TRD and anxiety but has partial response with the higher dosage of mirtazapine  HS. she has a long list of failed psychiatric meds.  She has not tended to do well with higher dosages of SSRIs. She has chronic residual anxiety which is significant. Patient is markedly depressed and anxious.  She  is afraid of doing TMS.  She knows her fears are exaggerated and irrational.  Chart review revealed that prior to starting treatment here patient had taken clomipramine in the past with some benefit but also with some side effects.  It is a tricyclic antidepressant distinctly different from SSRIs.  Some patients respond better to them.  She has a history of previous benefit.  It makes sense to retry that medication.  We discussed the pros and cons of combining it with Luvox but just not appropriate while she is on paroxetine.  We discussed the value of doing blood levels with tricyclic antidepressants like clomipramine.  She agrees to retry that medication.  5-HTP supplement 200 mg daily  A little better with olanzapine 5 mg HS vs risperidone 3 mg. Increase olanzapine to 10 mg HS  Call in 2 weeks if you are not seeing progressive improvement  ADD still better with Concerta 72 mg versus Focalin. . . Continue Luvox 100 + clomipramine 50 mg for longer trial  Lab 02/20/21 clomipramine 50 + Luvox 100 = clomip 195 + norclomip 111= total 306  She is continuing psychotherapy with Dr. Farrel Demark  Discussed potential metabolic side effects associated with atypical antipsychotics, as well as potential risk for movement side effects. Advised pt to contact office if movement side effects occur.  She agrees with the plan  TMS was discussed again. What if it goes wrong?    Follow-up in 3 weeks DT severity of sx.  Meredith Staggers MD, DFAPA  Future Appointments  Date Time Provider Department Center  04/17/2021  4:00 PM Robley Fries, PhD [facility] None  05/15/2021  8:15 AM [facility] None  05/17/2021  8:15 AM Reather Littler, MD [facility] None  02/20/2022  3:30 PM Wyline Beady A, NP [facility] None    No orders of the defined types were placed in this encounter.     -------------------------------

## 2021-04-04 ENCOUNTER — Other Ambulatory Visit: Payer: Self-pay | Admitting: Endocrinology

## 2021-04-04 DIAGNOSIS — E063 Autoimmune thyroiditis: Secondary | ICD-10-CM

## 2021-04-10 ENCOUNTER — Other Ambulatory Visit: Payer: Self-pay

## 2021-04-10 DIAGNOSIS — F900 Attention-deficit hyperactivity disorder, predominantly inattentive type: Secondary | ICD-10-CM

## 2021-04-10 MED ORDER — METHYLPHENIDATE HCL ER (OSM) 36 MG PO TBCR: 72.0000 mg | EXTENDED_RELEASE_TABLET | Freq: Every day | ORAL | 0 refills | Status: DC

## 2021-04-12 ENCOUNTER — Other Ambulatory Visit: Payer: Self-pay

## 2021-04-12 ENCOUNTER — Ambulatory Visit (INDEPENDENT_AMBULATORY_CARE_PROVIDER_SITE_OTHER): Payer: BC Managed Care – PPO | Admitting: Psychiatry

## 2021-04-12 ENCOUNTER — Encounter: Payer: Self-pay | Admitting: Psychiatry

## 2021-04-12 DIAGNOSIS — F4010 Social phobia, unspecified: Secondary | ICD-10-CM

## 2021-04-12 DIAGNOSIS — F908 Attention-deficit hyperactivity disorder, other type: Secondary | ICD-10-CM

## 2021-04-12 DIAGNOSIS — F900 Attention-deficit hyperactivity disorder, predominantly inattentive type: Secondary | ICD-10-CM

## 2021-04-12 DIAGNOSIS — E638 Other specified nutritional deficiencies: Secondary | ICD-10-CM

## 2021-04-12 DIAGNOSIS — F332 Major depressive disorder, recurrent severe without psychotic features: Secondary | ICD-10-CM | POA: Diagnosis not present

## 2021-04-12 DIAGNOSIS — F422 Mixed obsessional thoughts and acts: Secondary | ICD-10-CM | POA: Diagnosis not present

## 2021-04-12 DIAGNOSIS — F5102 Adjustment insomnia: Secondary | ICD-10-CM

## 2021-04-12 DIAGNOSIS — R251 Tremor, unspecified: Secondary | ICD-10-CM

## 2021-04-12 MED ORDER — METHYLPHENIDATE HCL ER (OSM) 36 MG PO TBCR: 72.0000 mg | EXTENDED_RELEASE_TABLET | Freq: Every day | ORAL | 0 refills | Status: DC

## 2021-04-12 MED ORDER — OLANZAPINE 10 MG PO TABS: 10.0000 mg | ORAL_TABLET | Freq: Every day | ORAL | 1 refills | Status: DC

## 2021-04-12 NOTE — Progress Notes (Signed)
Ashlee Chavez [id number] 1961/06/17 60 y.o.  Subjective:   Patient ID:  Ashlee Chavez is a 60 y.o. (DOB 01/28/1961) female.  Chief Complaint:  Chief Complaint  Patient presents with  . Follow-up  . Depression  . Anxiety    Depression        Associated symptoms include headaches.  Associated symptoms include no decreased concentration, no fatigue, no appetite change and no suicidal ideas.  Ashlee Chavez presents today for follow-up of recently worse depression DT relationship loss.    When seen February 10, 2019.  She had received some benefit from Ritalin 20 mg 3 times daily but was having issues with the crash.  We switched to Concerta 72 mg every morning.  September 14, 2019.  She was having persistent bothersome OCD and was interested in further medicine changes to try to help with these symptoms because she has aggressively pursued therapy and still has significant symptoms.  She has been on multiple SSRIs and so we initiated augmentation with risperidone half milligram daily to increase to 1 mg daily. Sig improvement with risperidone.  SE low appetite with it.  Not hungry for dinner. Sleep so much better also.  Improvement is "marked".  Think better bc less obsessing time.  Not constant now.  Noticed benefit within the first week.  Less obsessing lessened depression.  But residual symptoms.  visit October 14, 2019.  She was encouraged to experiment with the dosing of the risperidone up to 2 mg or 3 mg nightly to see if she could get additional reduction in her chronic anxiety and OCD.   seen December 15, 2019.  Settled at 2 mg risperidone bc increased sleep.  Sleep is so much better.  Harder to get up in the morning.   Found out insurance won't cover Concerta anymore.  Not as good on Ritalin TID.  Not as even a response.  Terrible insurance. Therefore the only med change with switching to Focalin to see if it would be covered better.  Appt 03/09/20, switch to Concerta 72 mg is  better with better mood and motivation vs Focalin.  Focus is also better.  Very poor function with Focalin and better with Concerta.   Better overall with more even response to stimulant and better appetite.  No SE.  Duration all day.  Less desperate hopeless feelings on the stimulant.  Stressful times. With increase risperidone a little better mood bump.  SE a little shakiness noticed in jaw.   Not unusual levels of anxiety.  Lost her best friend 1 year ago and still grieving.  Not much family. Alone.  Not close to B and S who live elsewhere.  Talks to God daily.  Gets to work daily.  Function is good there.  Reads and calls people.  OCD no worse with Covid. Did join a Bible study which was a huge step. Plan start lamotrigine   04/04/20 appt with the following noted: Appt moved up.  She thought she was supposed to stop risperidone when started lamotrigine.  Started having SI off the risperidone but wasn't sure if it was SE.  No risperidone 3 mg for 3 weeks.Anxiety shaking sweating crying all started right away.  Also trouble with sleep and can't eat.  Plan:Restart risperidone 1 mg now and another milligram in 2 to 3 hours if not feeling sufficient relief to get the full dose of 3 mg today.  Then resume 3 mg risperidone each evening. Restart lamotrigine 25 mg daily for  2 weeks and follow the previous instructions on increasing it.  05/11/2020 appointment the following is noted: Marked improvement back on risperidone 3 mg.  SE shakes jaw.  Doesn't affect typing. Started lamotrigine and up to 100 mg for 2 weeks.  Feels it's helped depression. Better mood and able to laugh.  Able to get pizza for Bible study and not cry. Apptetite is not great.  Working on it. Anxiety is better not gone. Plan: no med changes  07/26/20 appt with the following noted; Increased lamotrigine gradually to 200 mg daily over the phone 6 weeks ago.  No improvement really for depression. With it. CC depression.   Risperidone  still helping anxiety. Plan: Increase lamotrigine to 300 mg daily..  09/14/2020 appointment with following noted: Started taper of lamotrigine DT NR. Started latuda 20 mg daily for 10 days.  Tolerated fine.  No effects so far.. Main problem is still depression.  Anxiety is not too bad.  No energy, motivation, sleeeping a lot.  No sig SI. In past started risperidone for anxiety and depression.  Increase Latuda to 40 mg daily. Disc potential DDI with risperidone reducing effectiveness of  Latuda so reduc risperidone to 1.5 mg HS  10/09/2020 phone call from patient: Patient left a voicemail on Friday while we were out of office for the Holiday that she is not any better. She feels the cutting in the 1/2 of Risperidone has really increased her anxiety. She was instructed to call back if not any better but concerned that taking away the Risperidone will really exacerbate her anxiety.  MD response:  At the last appointment we increased Latuda to 40 mg daily and reduced risperidone to half tablet daily.  The goal was to use Latuda and eliminate risperidone.  However since her anxiety has worsened with the reduction in risperidone, I agree with her and she should take the full dose of risperidone as well as continue the Latuda 40 mg daily.  The [name] is intended to help depression.  11/23/2020 appointment with the following noted: Propranolol only once daily. No better with higher Latuda.  No SE either. No change in appetite which is poor.  Still struggling with depression and OCD is a little worse with changes at work. Off lamotrigine. Plan: 5-HTP supplement 200 mg daily Increase Latuda to 60 mg  for 3 weeks, If no benefit and no SE increase to 80 mg daily with food. ADD still better with Concerta 72 mg versus Focalin. Continue high dosage paroxetine 80 mg daily.  01/16/2021 appointment with following noted: Increase Latuda to 80 mg without benefit or SE.  Anxiety and depression with poor STM.   Food getting caught in her throat.  No change in eating.  No change in weight.   Anxious thoughts mostly about work even when not there.  Anxious about work needed on the house she can't afford.  Enjoys reading but feels guilty that she should be doing something else.  More general anxiety than OCD. 5-HTP supplement 200 mg daily Wean Latuda over a week DT NR Reduce mirtazapine to 30 mg HS  ADD still better with Concerta 72 mg versus Focalin. Start clomipramine 25 mg capsules 1 at night for 1 week, Then increase clomipramine to 2 capsules at night and reduce paroxetine to 1-1/2 tablets or 60 mg daily. Wait 1 week and then get the blood test   03/15/2021 appt noted: OK not great.  About the same.  Not much change in the last 8 weeks.  SE none Lab 02/20/21 clomipramine 50 + Luvox 100 = clomip 195 + norclomip 111= total 306 Losing weight and no appetite and hard to eat. Weight loss to 107#. A lot of anxiety and depression   Sleep less sound with less mirtazapine.  Plan: Due to the severity of symptoms,Add olanzapine 5 mg tablet 1 at night Stop mirtazapine Reduce risperidone to 1/2 tablet for 1 week then stop it.  Call if SI recurs.  03/30/2021 appointment with the following noted: It makes me a little tired but so far anxiety is improved.  No problems off risperidone.  Appetite is not better but did get protein bars and is eating.   Less panic.  But still occurs.  Things just started this week to be a little better.  Not huge but it's a start.  A little more energy. Sleep 10 hours but that's unchanged with olanzapine. First time I've had hope in a long time. Plan: A little better with olanzapine 5 mg HS vs risperidone 3 mg. Increase olanzapine to 10 mg HS  Call in 2 weeks if you are not seeing progressive improvement  04/12/2021 appointment with the following noted: Better with anxiety.  Sleep better.  Daytime could nap if not busy.  Otherwise OK.  Not much change in appetite.  Up to 110# about  2.5# better and trying to get protein twice daily.   A little change in OCD.   Depression is better some and staying up later and doing more than she was.  Doing some things after dinner which is huge.  Big change with depression.  Not retreating to bed as quickly.  [name] and describes less severe..  Anxiety symptoms include: Excessive Worry, Obsessive Compulsive Symptoms:   Handwashing,,.  Pt reports no sleep issues. Pt reports that appetite is good. Pt reports that energy is good and down slightly. Concentration is no change. Suicidal thoughts:  denied by patient.  Numerous failed psychiatric medication trials include Lexapro 60 mg a day, Zoloft 250, Paxil with weight gain,   Clomipramine,  Wellbutrin 300 with side effects,  mirtazapine Rexulti which helped initially,  Vraylar, Abilify between 1 and 2.5 mg and up to 15 mg with no response,  Latuda 80 failed risperidone Lamotrigine 300 NR buspirone 30 mg,  Cytomel,  lithium 625,  pramipexole with insomnia,   Lunesta with crying spells,  topiramate, Deplin, propranolol,   Ritalin, Focalin NR, Concerta 72 helped No history of Prozac, Viibryd.  Review of Systems:  Review of Systems  Constitutional: Negative for activity change, appetite change, diaphoresis, fatigue and unexpected weight change.  Cardiovascular: Negative for chest pain and palpitations.  Gastrointestinal: Negative for abdominal pain.  Neurological: Positive for tremors and headaches. Negative for dizziness, weakness and light-headedness.       Mouth  Psychiatric/Behavioral: Positive for depression. Negative for agitation, behavioral problems, confusion, decreased concentration, hallucinations, self-injury, sleep disturbance and suicidal ideas. The patient is not hyperactive.     Medications: I have reviewed the patient's current medications.  Current Outpatient Medications  Medication Sig Dispense Refill  . ALPRAZolam  (XANAX) 0.25 MG tablet Take 1 tablet (0.25 mg total) by mouth daily as needed for anxiety. 30 tablet 0  . atorvastatin (LIPITOR) 20 MG tablet TAKE ONE TABLET BY MOUTH ONCE DAILY 90 tablet 1  . Calcium Carbonate-Vitamin D (OSCAL 500/200 D-3 PO) Take by mouth.    . Cholecalciferol (VITAMIN D PO) Take by mouth.    . clomiPRAMINE (  ANAFRANIL) 25 MG capsule Take 2 capsules (50 mg total) by mouth at bedtime. 60 capsule 1  . fluvoxaMINE (LUVOX) 100 MG tablet TAKE 1 TABLET BY MOUTH EVERYDAY AT BEDTIME 90 tablet 0  . levothyroxine (SYNTHROID) 100 MCG tablet TAKE 1 TABLET BY MOUTH EVERY DAY 90 tablet 1  . methylphenidate (CONCERTA) 36 MG PO CR tablet Take 2 tablets (72 mg total) by mouth daily. 60 tablet 0  . OLANZapine (ZYPREXA) 10 MG tablet Take 1 tablet (10 mg total) by mouth at bedtime. 30 tablet 0  . propranolol (INDERAL) 20 MG tablet TAKE 1 TO 2 TABLETS TWICE A DAY AS NEEDED FOR TREMOR 100 tablet 3  . risedronate (ACTONEL) 150 MG tablet TAKE 1 TABLET EVERY 30 DAYS WITH WATER ON EMPTY STOMACH, NOTHING BY MOUTH OR LIE DOWN FOR 3 tablet 1  . SUMAtriptan (IMITREX) 100 MG tablet Take 100 mg by mouth daily as needed for migraine.    . topiramate (TOPAMAX) 100 MG tablet Take 1 tablet (100 mg total) by mouth 2 (two) times daily. 180 tablet 0   No current facility-administered medications for this visit.    Medication Side Effects:  Watch jaw tremor  Allergies:  Allergies  Allergen Reactions  . Ampicillin Rash    Past Medical History:  Diagnosis Date  . Gum lesion    Biopsy done-benign  . Hypercholesteremia   . Hypothyroid   . LGSIL (low grade squamous intraepithelial dysplasia) 2010  . Migraines   . OCD (obsessive compulsive disorder)   . Osteoporosis 01/2016   T score -2.7    Family History  Problem Relation Age of Onset  . Hypertension Mother   . Heart disease Mother   . Hypertension Father   . Breast cancer Paternal Aunt        Age 27  . Diabetes Neg Hx     Social History    Socioeconomic History  . Marital status: Single    Spouse name: Not on file  . Number of children: Not on file  . Years of education: Not on file  . Highest education level: Not on file  Occupational History  . Not on file  Tobacco Use  . Smoking status: Never Smoker  . Smokeless tobacco: Never Used  Vaping Use  . Vaping Use: Never used  Substance and Sexual Activity  . Alcohol use: Yes    Alcohol/week: 0.0 standard drinks    Comment: wine occassionally  . Drug use: No  . Sexual activity: Not Currently    Birth control/protection: Post-menopausal    Comment: 1st intercourse 51 yo-5 partners  Other Topics Concern  . Not on file  Social History Narrative  . Not on file   Social Determinants of Health   Financial Resource Strain: Not on file  Food Insecurity: Not on file  Transportation Needs: Not on file  Physical Activity: Not on file  Stress: Not on file  Social Connections: Not on file  Intimate Partner Violence: Not on file    Past Medical History, Surgical history, Social history, and Family history were reviewed and updated as appropriate.   Please see review of systems for further details on the patient's review from today.   Objective:   Physical Exam:  LMP 11/19/2010   Physical Exam Constitutional:      General: She is not in acute distress.    Appearance: Normal appearance. She is well-developed.  Musculoskeletal:        General: No deformity.  Neurological:  Mental Status: She is alert and oriented to person, place, and time.     Motor: Tremor present.     Coordination: Coordination normal.     Comments: Mild mouth tremor  Psychiatric:        Attention and Perception: She is attentive. She does not perceive auditory hallucinations.        Mood and Affect: Mood is anxious and depressed. Affect is not labile, blunt, angry or inappropriate.        Speech: Speech is delayed.        Behavior: Behavior is slowed.        Thought Content: Thought  content is not paranoid. Thought content does not include homicidal or suicidal ideation. Thought content does not include homicidal or suicidal plan.        Cognition and Memory: Cognition normal.        Judgment: Judgment normal.     Comments: Insight intact. No auditory or visual hallucinations. No delusions.  More OCD with job changes triggered more checking. Severe sx a little better     Lab Review:     Component Value Date/Time   NA 139 11/14/2020 0804   K 4.0 11/14/2020 0804   CL 104 11/14/2020 0804   CO2 27 11/14/2020 0804   GLUCOSE 96 11/14/2020 0804   BUN 15 11/14/2020 0804   CREATININE 0.82 11/14/2020 0804   CALCIUM 9.8 11/14/2020 0804   PROT 6.9 11/14/2020 0804   ALBUMIN 4.6 11/14/2020 0804   AST 19 11/14/2020 0804   ALT 19 11/14/2020 0804   ALKPHOS 41 11/14/2020 0804   BILITOT 0.4 11/14/2020 0804       Component Value Date/Time   WBC 5.9 01/29/2016 0834   RBC 4.85 01/29/2016 0834   HGB 15.2 (H) 01/29/2016 0834   HCT 46.2 (H) 01/29/2016 0834   PLT 232.0 01/29/2016 0834   MCV 95.3 01/29/2016 0834   MCHC 33.0 01/29/2016 0834   RDW 14.7 01/29/2016 0834   LYMPHSABS 1.3 01/29/2016 0834   MONOABS 0.4 01/29/2016 0834   EOSABS 0.3 01/29/2016 0834   BASOSABS 0.0 01/29/2016 0834    No results found for: POCLITH, LITHIUM   No results found for: PHENYTOIN, PHENOBARB, VALPROATE, CBMZ   .res Assessment: Plan:    Katyana was seen today for follow-up, depression and anxiety.  Diagnoses and all orders for this visit:  Major depressive disorder, recurrent severe without psychotic features (HCC)  Mixed obsessional thoughts and acts  Social anxiety disorder  Attention deficit hyperactivity disorder (ADHD), other type - secondary to anxiety and depression  Tremor of face and hands  Insomnia due to psychological stress   We discussed TRD and anxiety . she has a long list of failed psychiatric meds.  She has not tended to do well with higher dosages of SSRIs.   She is afraid of doing TMS.  She knows her fears are exaggerated and irrational. Patient has been markedly depressed and anxious.   Since adding olanzapine 10 mg she has seen a significant improvement in depression and anxiety but the symptoms are still pretty severe.  We discussed the pros and cons of increasing the dose but will give this dose a longer time to work.  Chart review revealed that prior to starting treatment here patient had taken clomipramine in the past with some benefit but also with some side effects.  It is a tricyclic antidepressant distinctly different from SSRIs.  Some patients respond better to them.  She has a history  of previous benefit.  It makes sense to retry that medication.  We discussed the pros and cons of combining it with Luvox but just not appropriate while she is on paroxetine.  We discussed the value of doing blood levels with tricyclic antidepressants like clomipramine.  She agrees to retry that medication.  5-HTP supplement 200 mg daily  ADD still better with Concerta 72 mg versus Focalin. . . Continue Luvox 100 + clomipramine 50 mg for longer trial  Lab 02/20/21 clomipramine 50 + Luvox 100 = clomip 195 + norclomip 111= total 306  She is continuing psychotherapy with Dr. Farrel Demark  Discussed potential metabolic side effects associated with atypical antipsychotics, as well as potential risk for movement side effects. Advised pt to contact office if movement side effects occur.  She agrees with the plan  Mouth tremor appears unchanged since the switch from risperidone to olanzapine.  We will continue to monitor.  TMS was discussed again. What if it goes wrong?    Follow-up in 4-5 weeks DT severity of sx.  Meredith Staggers MD, DFAPA  Future Appointments  Date Time Provider Department Center  04/17/2021  4:00 PM Robley Fries, PhD [facility] None  05/15/2021  8:15 AM [facility] None  05/17/2021  8:15 AM Reather Littler, MD [facility] None  02/20/2022   3:30 PM Wyline Beady A, NP [facility] None    No orders of the defined types were placed in this encounter.     -------------------------------

## 2021-04-17 ENCOUNTER — Other Ambulatory Visit: Payer: Self-pay

## 2021-04-17 ENCOUNTER — Ambulatory Visit (INDEPENDENT_AMBULATORY_CARE_PROVIDER_SITE_OTHER): Payer: BC Managed Care – PPO | Admitting: Psychiatry

## 2021-04-17 DIAGNOSIS — F4010 Social phobia, unspecified: Secondary | ICD-10-CM | POA: Diagnosis not present

## 2021-04-17 DIAGNOSIS — F332 Major depressive disorder, recurrent severe without psychotic features: Secondary | ICD-10-CM

## 2021-04-17 DIAGNOSIS — F422 Mixed obsessional thoughts and acts: Secondary | ICD-10-CM

## 2021-04-17 DIAGNOSIS — F5102 Adjustment insomnia: Secondary | ICD-10-CM | POA: Diagnosis not present

## 2021-04-17 NOTE — Progress Notes (Signed)
Psychotherapy Progress Note [facility] Ashlee Chavez, Ashlee Chavez  Patient ID: Ashlee Chavez     MRN: [id number] Therapy format: Individual psychotherapy Date: 04/17/2021      Start: 4:13p     Stop: 5:03p     Time Spent: 50 min Location: In-person   Session narrative (presenting needs, interim history, self-report of stressors and symptoms, applications of prior therapy, status changes, and interventions made in session) Feeling better with a med change (risperidone to olanzapine), and with improving protein intake.  Clearly reduced pressure to obsess and to ruminate, mood rising.  Not going to bed 6:30p any more, is making it to 8:30 or 9pm and is reading or watching TV.  Sleep is sufficient, and restful enough.  Anxiety at work, as with the rest of the day, is less acute.  Checking about the same -- three passes with the payroll system, but less emotional intensity.  Has maybe gotten less uncomfortable bringing mistakes or problems to her boss, and has put away the worry she'll get herself fired.  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  Got her cat to the vet, spent substantially on tests, identified a robust thyroid condition and possible intestinal cancer, got definitive word she was not going to overcome her suffering (could poop 8x a day and still have cramping, went all over the house), got blessing to euthanize.  Can still feel guilty, but also capable of reminding herself she acted with mercy and did the right thing.  Glad to have taken up the plastic all over, and in process of reducing from 3 litter boxes to 2, with much less cleanup.  Now using the freed time mornings for watering plants.  Can still feel guilty about putting her cat to death, but consistently concludes she did the right thing.  Supported and affirmed.  Overall,  feels substantial gains in morale, energy, and anxiety relief, attributed in part to the medication change and largely to the relief of ending her prolonged lifestyle stress with the sick cat.  Therapeutic modalities: Cognitive Behavioral Therapy and Solution-Oriented/Positive Psychology  Mental Status/Observations:  Appearance:   Neat     Behavior:  Appropriate  Motor:  Normal  Speech/Language:   Clear and Coherent  Affect:  Appropriate  Mood:  Less anxious  Thought process:  normal  Thought content:    WNL  Sensory/Perceptual disturbances:    WNL  Orientation:  Fully oriented  Attention:  Good    Concentration:  Good  Memory:  WNL  Insight:    Good  Judgment:   Good  Impulse Control:  Good   Risk Assessment: Danger to Self: No Self-injurious Behavior: No Danger to Others: No Physical Aggression / Violence: No Duty to Warn: No Access to Firearms a concern: No  Assessment of progress:  progressing well  Diagnosis:   ICD-10-CM   1. Major depressive disorder, recurrent severe without psychotic features (HCC)  F33.2     2. Mixed obsessional thoughts and acts  F42.2     3. Social anxiety disorder  F40.10     4. Insomnia due to psychological stress  F51.02      Plan:  Self-affirm as needed, did the right thing with cat Continuing encouragement to attack OCD by either pushing through doubt or committing intentional and egregious data entry errors, either tangibly  or imaginally, with or without recruiting her manager into  Other recommendations/advice as may be noted above Continue to utilize previously learned skills ad lib Maintain medication as prescribed and work faithfully with relevant prescriber(s) if any changes are desired or seem indicated Call the clinic on-call service, present to ER, or call 911 if any life-threatening psychiatric crisis Return up to 6 wks. Already scheduled visit in this office 05/18/2021.  Ashlee Fries, Ashlee Ashlee Chavez, Ashlee Chavez Clinical  Psychologist, [facility] [address] [place], [place] [address] [phone]

## 2021-05-09 ENCOUNTER — Other Ambulatory Visit: Payer: Self-pay

## 2021-05-09 DIAGNOSIS — G43019 Migraine without aura, intractable, without status migrainosus: Secondary | ICD-10-CM | POA: Diagnosis not present

## 2021-05-09 DIAGNOSIS — R519 Headache, unspecified: Secondary | ICD-10-CM | POA: Diagnosis not present

## 2021-05-09 DIAGNOSIS — G43719 Chronic migraine without aura, intractable, without status migrainosus: Secondary | ICD-10-CM | POA: Diagnosis not present

## 2021-05-09 DIAGNOSIS — F900 Attention-deficit hyperactivity disorder, predominantly inattentive type: Secondary | ICD-10-CM

## 2021-05-14 ENCOUNTER — Other Ambulatory Visit: Payer: Self-pay | Admitting: Psychiatry

## 2021-05-14 DIAGNOSIS — F422 Mixed obsessional thoughts and acts: Secondary | ICD-10-CM

## 2021-05-14 DIAGNOSIS — F332 Major depressive disorder, recurrent severe without psychotic features: Secondary | ICD-10-CM

## 2021-05-15 ENCOUNTER — Other Ambulatory Visit: Payer: BC Managed Care – PPO

## 2021-05-17 ENCOUNTER — Ambulatory Visit: Payer: BC Managed Care – PPO | Admitting: Endocrinology

## 2021-05-18 ENCOUNTER — Encounter: Payer: Self-pay | Admitting: Psychiatry

## 2021-05-18 ENCOUNTER — Other Ambulatory Visit: Payer: Self-pay

## 2021-05-18 ENCOUNTER — Ambulatory Visit (INDEPENDENT_AMBULATORY_CARE_PROVIDER_SITE_OTHER): Payer: BC Managed Care – PPO | Admitting: Psychiatry

## 2021-05-18 DIAGNOSIS — F900 Attention-deficit hyperactivity disorder, predominantly inattentive type: Secondary | ICD-10-CM

## 2021-05-18 DIAGNOSIS — F332 Major depressive disorder, recurrent severe without psychotic features: Secondary | ICD-10-CM

## 2021-05-18 DIAGNOSIS — E638 Other specified nutritional deficiencies: Secondary | ICD-10-CM

## 2021-05-18 DIAGNOSIS — F422 Mixed obsessional thoughts and acts: Secondary | ICD-10-CM

## 2021-05-18 DIAGNOSIS — F4010 Social phobia, unspecified: Secondary | ICD-10-CM

## 2021-05-18 MED ORDER — OLANZAPINE 15 MG PO TABS: 15.0000 mg | ORAL_TABLET | Freq: Every day | ORAL | 0 refills | Status: DC

## 2021-05-18 MED ORDER — METHYLPHENIDATE HCL ER (OSM) 36 MG PO TBCR: 72.0000 mg | EXTENDED_RELEASE_TABLET | Freq: Every day | ORAL | 0 refills | Status: DC

## 2021-05-18 NOTE — Progress Notes (Signed)
Ashlee Chavez [id number] March 01, 1961 60 y.o.  Subjective:   Patient ID:  Ashlee Chavez is a 60 y.o. (DOB 1961/06/22) female.  Chief Complaint:  Chief Complaint  Patient presents with  . Follow-up  . Depression    Depression        Associated symptoms include headaches.  Associated symptoms include no decreased concentration, no fatigue, no appetite change and no suicidal ideas. Ashlee Chavez presents today for follow-up of recently worse depression DT relationship loss.    When seen February 10, 2019.  She had received some benefit from Ritalin 20 mg 3 times daily but was having issues with the crash.  We switched to Concerta 72 mg every morning.  September 14, 2019.  She was having persistent bothersome OCD and was interested in further medicine changes to try to help with these symptoms because she has aggressively pursued therapy and still has significant symptoms.  She has been on multiple SSRIs and so we initiated augmentation with risperidone half milligram daily to increase to 1 mg daily. Sig improvement with risperidone.  SE low appetite with it.  Not hungry for dinner. Sleep so much better also.  Improvement is "marked".  Think better bc less obsessing time.  Not constant now.  Noticed benefit within the first week.  Less obsessing lessened depression.  But residual symptoms.  visit October 14, 2019.  She was encouraged to experiment with the dosing of the risperidone up to 2 mg or 3 mg nightly to see if she could get additional reduction in her chronic anxiety and OCD.   seen December 15, 2019.  Settled at 2 mg risperidone bc increased sleep.  Sleep is so much better.  Harder to get up in the morning.   Found out insurance won't cover Concerta anymore.  Not as good on Ritalin TID.  Not as even a response.  Terrible insurance. Therefore the only med change with switching to Focalin to see if it would be covered better.  Appt 03/09/20, switch to Concerta 72 mg is better with  better mood and motivation vs Focalin.  Focus is also better.  Very poor function with Focalin and better with Concerta.   Better overall with more even response to stimulant and better appetite.  No SE.  Duration all day.  Less desperate hopeless feelings on the stimulant.  Stressful times. With increase risperidone a little better mood bump.  SE a little shakiness noticed in jaw.   Not unusual levels of anxiety.  Lost her best friend 1 year ago and still grieving.  Not much family. Alone.  Not close to B and S who live elsewhere.  Talks to God daily.  Gets to work daily.  Function is good there.  Reads and calls people.  OCD no worse with Covid. Did join a Bible study which was a huge step. Plan start lamotrigine   04/04/20 appt with the following noted: Appt moved up.  She thought she was supposed to stop risperidone when started lamotrigine.  Started having SI off the risperidone but wasn't sure if it was SE.  No risperidone 3 mg for 3 weeks.Anxiety shaking sweating crying all started right away.  Also trouble with sleep and can't eat.  Plan:Restart risperidone 1 mg now and another milligram in 2 to 3 hours if not feeling sufficient relief to get the full dose of 3 mg today.  Then resume 3 mg risperidone each evening. Restart lamotrigine 25 mg daily for 2 weeks and follow  the previous instructions on increasing it.  05/11/2020 appointment the following is noted: Marked improvement back on risperidone 3 mg.  SE shakes jaw.  Doesn't affect typing. Started lamotrigine and up to 100 mg for 2 weeks.  Feels it's helped depression. Better mood and able to laugh.  Able to get pizza for Bible study and not cry. Apptetite is not great.  Working on it. Anxiety is better not gone. Plan: no med changes  07/26/20 appt with the following noted; Increased lamotrigine gradually to 200 mg daily over the phone 6 weeks ago.  No improvement really for depression. With it. CC depression.   Risperidone still helping  anxiety. Plan: Increase lamotrigine to 300 mg daily..  09/14/2020 appointment with following noted: Started taper of lamotrigine DT NR. Started latuda 20 mg daily for 10 days.  Tolerated fine.  No effects so far.. Main problem is still depression.  Anxiety is not too bad.  No energy, motivation, sleeeping a lot.  No sig SI. In past started risperidone for anxiety and depression.  Increase Latuda to 40 mg daily. Disc potential DDI with risperidone reducing effectiveness of  Latuda so reduc risperidone to 1.5 mg HS  10/09/2020 phone call from patient: Patient left a voicemail on Friday while we were out of office for the Holiday that she is not any better. She feels the cutting in the 1/2 of Risperidone has really increased her anxiety. She was instructed to call back if not any better but concerned that taking away the Risperidone will really exacerbate her anxiety.  MD response:  At the last appointment we increased Latuda to 40 mg daily and reduced risperidone to half tablet daily.  The goal was to use Latuda and eliminate risperidone.  However since her anxiety has worsened with the reduction in risperidone, I agree with her and she should take the full dose of risperidone as well as continue the Latuda 40 mg daily.  The Kasandra KnudsenLatuda is intended to help depression.  11/23/2020 appointment with the following noted: Propranolol only once daily. No better with higher Latuda.  No SE either. No change in appetite which is poor.  Still struggling with depression and OCD is a little worse with changes at work. Off lamotrigine. Plan: 5-HTP supplement 200 mg daily  Increase Latuda to 60 mg  for 3 weeks, If no benefit and no SE increase to 80 mg daily with food.  ADD still better with Concerta 72 mg versus Focalin. Continue high dosage paroxetine 80 mg daily.  01/16/2021 appointment with following noted: Increase Latuda to 80 mg without benefit or SE.  Anxiety and depression with poor STM.  Food getting  caught in her throat.  No change in eating.  No change in weight.   Anxious thoughts mostly about work even when not there.  Anxious about work needed on the house she can't afford.  Enjoys reading but feels guilty that she should be doing something else.  More general anxiety than OCD. 5-HTP supplement 200 mg daily Wean Latuda over a week DT NR Reduce mirtazapine to 30 mg HS  ADD still better with Concerta 72 mg versus Focalin. Start clomipramine 25 mg capsules 1 at night for 1 week, Then increase clomipramine to 2 capsules at night and reduce paroxetine to 1-1/2 tablets or 60 mg daily. Wait 1 week and then get the blood test   03/15/2021 appt noted: OK not great.  About the same.  Not much change in the last 8 weeks. SE none  Lab 02/20/21 clomipramine 50 + Luvox 100 = clomip 195 + norclomip 111= total 306 Losing weight and no appetite and hard to eat. Weight loss to 107#. A lot of anxiety and depression   Sleep less sound with less mirtazapine.  Plan: Due to the severity of symptoms,Add olanzapine 5 mg tablet 1 at night Stop mirtazapine Reduce risperidone to 1/2 tablet for 1 week then stop it.  Call if SI recurs.  03/30/2021 appointment with the following noted: It makes me a little tired but so far anxiety is improved.  No problems off risperidone.  Appetite is not better but did get protein bars and is eating.   Less panic.  But still occurs.  Things just started this week to be a little better.  Not huge but it's a start.  A little more energy. Sleep 10 hours but that's unchanged with olanzapine. First time I've had hope in a long time. Plan: A little better with olanzapine 5 mg HS vs risperidone 3 mg. Increase olanzapine to 10 mg HS  Call in 2 weeks if you are not seeing progressive improvement  04/12/2021 appointment with the following noted: Better with anxiety.  Sleep better.  Daytime could nap if not busy.  Otherwise OK.  Not much change in appetite.  Up to 110# about 2.5# better  and trying to get protein twice daily.   A little change in OCD.   Depression is better some and staying up later and doing more than she was.  Doing some things after dinner which is huge.  Big change with depression.  Not retreating to bed as quickly. Plan: continue meds.  05/18/21 appt noted: Further improvement in anxiety.  I feel better but still not going out but not excessively sleeping.  Acting more normally, eating better.  Eating dinner again.  Sleep reduced to 8 hours. SE dry mouth and a little hungrier. Asks about going up on olanzapine to further reduce anxiety.  [name] and describes better from 10/10 to 4/10 for anxiety and depression down to 3/10.Marland Kitchen  Anxiety symptoms include: Excessive Worry, Obsessive Compulsive Symptoms:   Handwashing,,.  Pt reports no sleep issues. Pt reports that appetite is good. Pt reports that energy is good and down slightly. Concentration is no change. Suicidal thoughts:  denied by patient.  Numerous failed psychiatric medication trials include Lexapro 60 mg a day, Zoloft 250, Paxil with weight gain,   Clomipramine,  Wellbutrin 300 with side effects,  mirtazapine Rexulti which helped initially,  Vraylar, Abilify between 1 and 2.5 mg and up to 15 mg with no response,  Latuda 80 failed risperidone Lamotrigine 300 NR buspirone 30 mg,  Cytomel,  lithium 625,  pramipexole with insomnia,   Lunesta with crying spells,  topiramate, Deplin, propranolol,   Ritalin, Focalin NR, Concerta 72 helped No history of Prozac, Viibryd.  Review of Systems:  Review of Systems  Constitutional:  Negative for activity change, appetite change, diaphoresis, fatigue and unexpected weight change.  Cardiovascular:  Negative for palpitations.  Gastrointestinal:  Negative for abdominal pain.  Neurological:  Positive for tremors and headaches. Negative for dizziness, weakness and light-headedness.       Mouth  Psychiatric/Behavioral:   Positive for depression. Negative for agitation, behavioral problems, confusion, decreased concentration, hallucinations, self-injury, sleep disturbance and suicidal ideas. The patient is not hyperactive.    Medications: I have reviewed the patient's current medications.  Current Outpatient Medications  Medication Sig Dispense Refill  .  ALPRAZolam (XANAX) 0.25 MG tablet Take 1 tablet (0.25 mg total) by mouth daily as needed for anxiety. 30 tablet 0  . atorvastatin (LIPITOR) 20 MG tablet TAKE ONE TABLET BY MOUTH ONCE DAILY 90 tablet 1  . Calcium Carbonate-Vitamin D (OSCAL 500/200 D-3 PO) Take by mouth.    . Cholecalciferol (VITAMIN D PO) Take by mouth.    . clomiPRAMINE (ANAFRANIL) 25 MG capsule TAKE 2 CAPSULES (50 MG TOTAL) BY MOUTH AT BEDTIME. 60 capsule 0  . fluvoxaMINE (LUVOX) 100 MG tablet TAKE 1 TABLET BY MOUTH EVERYDAY AT BEDTIME 90 tablet 0  . levothyroxine (SYNTHROID) 100 MCG tablet TAKE 1 TABLET BY MOUTH EVERY DAY 90 tablet 1  . propranolol (INDERAL) 20 MG tablet TAKE 1 TO 2 TABLETS TWICE A DAY AS NEEDED FOR TREMOR 100 tablet 3  . risedronate (ACTONEL) 150 MG tablet TAKE 1 TABLET EVERY 30 DAYS WITH WATER ON EMPTY STOMACH, NOTHING BY MOUTH OR LIE DOWN FOR 3 tablet 1  . SUMAtriptan (IMITREX) 100 MG tablet Take 100 mg by mouth daily as needed for migraine.    . topiramate (TOPAMAX) 100 MG tablet Take 1 tablet (100 mg total) by mouth 2 (two) times daily. 180 tablet 0  . methylphenidate (CONCERTA) 36 MG PO CR tablet Take 2 tablets (72 mg total) by mouth daily. 60 tablet 0  . OLANZapine (ZYPREXA) 15 MG tablet Take 1 tablet (15 mg total) by mouth at bedtime. 30 tablet 0   No current facility-administered medications for this visit.    Medication Side Effects:  Watch jaw tremor  Allergies:  Allergies  Allergen Reactions  . Ampicillin Rash    Past Medical History:  Diagnosis Date  . Gum lesion    Biopsy done-benign  . Hypercholesteremia   . Hypothyroid   . LGSIL (low grade  squamous intraepithelial dysplasia) 2010  . Migraines   . OCD (obsessive compulsive disorder)   . Osteoporosis 01/2016   T score -2.7    Family History  Problem Relation Age of Onset  . Hypertension Mother   . Heart disease Mother   . Hypertension Father   . Breast cancer Paternal Aunt        Age 16  . Diabetes Neg Hx     Social History   Socioeconomic History  . Marital status: Single    Spouse name: Not on file  . Number of children: Not on file  . Years of education: Not on file  . Highest education level: Not on file  Occupational History  . Not on file  Tobacco Use  . Smoking status: Never  . Smokeless tobacco: Never  Vaping Use  . Vaping Use: Never used  Substance and Sexual Activity  . Alcohol use: Yes    Alcohol/week: 0.0 standard drinks    Comment: wine occassionally  . Drug use: No  . Sexual activity: Not Currently    Birth control/protection: Post-menopausal    Comment: 1st intercourse 73 yo-5 partners  Other Topics Concern  . Not on file  Social History Narrative  . Not on file   Social Determinants of Health   Financial Resource Strain: Not on file  Food Insecurity: Not on file  Transportation Needs: Not on file  Physical Activity: Not on file  Stress: Not on file  Social Connections: Not on file  Intimate Partner Violence: Not on file    Past Medical History, Surgical history, Social history, and Family history were reviewed and updated as appropriate.   Please  see review of systems for further details on the patient's review from today.   Objective:   Physical Exam:  BP (!) 91/57   Pulse 83   LMP 11/19/2010   Physical Exam Constitutional:      General: She is not in acute distress.    Appearance: Normal appearance. She is well-developed.  Musculoskeletal:        General: No deformity.  Neurological:     Mental Status: She is alert and oriented to person, place, and time.     Motor: Tremor present.     Coordination: Coordination  normal.     Comments: Mild mouth tremor  Psychiatric:        Attention and Perception: She is attentive. She does not perceive auditory hallucinations.        Mood and Affect: Mood is anxious and depressed. Affect is not labile, blunt, angry or inappropriate.        Speech: Speech is delayed.        Behavior: Behavior is slowed.        Thought Content: Thought content is not paranoid. Thought content does not include homicidal or suicidal ideation. Thought content does not include homicidal or suicidal plan.        Cognition and Memory: Cognition normal.        Judgment: Judgment normal.     Comments: Insight intact. No auditory or visual hallucinations. No delusions. Less checking. Severe sx further better    Lab Review:     Component Value Date/Time   NA 139 11/14/2020 0804   K 4.0 11/14/2020 0804   CL 104 11/14/2020 0804   CO2 27 11/14/2020 0804   GLUCOSE 96 11/14/2020 0804   BUN 15 11/14/2020 0804   CREATININE 0.82 11/14/2020 0804   CALCIUM 9.8 11/14/2020 0804   PROT 6.9 11/14/2020 0804   ALBUMIN 4.6 11/14/2020 0804   AST 19 11/14/2020 0804   ALT 19 11/14/2020 0804   ALKPHOS 41 11/14/2020 0804   BILITOT 0.4 11/14/2020 0804       Component Value Date/Time   WBC 5.9 01/29/2016 0834   RBC 4.85 01/29/2016 0834   HGB 15.2 (H) 01/29/2016 0834   HCT 46.2 (H) 01/29/2016 0834   PLT 232.0 01/29/2016 0834   MCV 95.3 01/29/2016 0834   MCHC 33.0 01/29/2016 0834   RDW 14.7 01/29/2016 0834   LYMPHSABS 1.3 01/29/2016 0834   MONOABS 0.4 01/29/2016 0834   EOSABS 0.3 01/29/2016 0834   BASOSABS 0.0 01/29/2016 0834    No results found for: POCLITH, LITHIUM   No results found for: PHENYTOIN, PHENOBARB, VALPROATE, CBMZ   .res Assessment: Plan:    Ashlee Chavez was seen today for follow-up and depression.  Diagnoses and all orders for this visit:  Major depressive disorder, recurrent severe without psychotic features (HCC) -     OLANZapine (ZYPREXA) 15 MG tablet; Take 1 tablet (15  mg total) by mouth at bedtime.  Mixed obsessional thoughts and acts -     OLANZapine (ZYPREXA) 15 MG tablet; Take 1 tablet (15 mg total) by mouth at bedtime.  Social anxiety disorder -     OLANZapine (ZYPREXA) 15 MG tablet; Take 1 tablet (15 mg total) by mouth at bedtime.  Imbalanced nutrition -     OLANZapine (ZYPREXA) 15 MG tablet; Take 1 tablet (15 mg total) by mouth at bedtime.  Attention deficit hyperactivity disorder (ADHD), predominantly inattentive type -     methylphenidate (CONCERTA) 36 MG PO CR tablet; Take 2  tablets (72 mg total) by mouth daily.  We discussed TRD and anxiety . she has a long list of failed psychiatric meds.  She has not tended to do well with higher dosages of SSRIs.  She is afraid of doing TMS.  She knows her fears are exaggerated and irrational. Patient has been markedly depressed and anxious.   Since adding olanzapine 10 mg she has seen a significant improvement in depression and anxiety but the symptoms are still pretty debilitating.  We discussed the pros and cons of increasing the dose and will increase to 15 mg HS  Disc difference between depression and situational sadness.  Chart review revealed that prior to starting treatment here patient had taken clomipramine in the past with some benefit but also with some side effects.  It is a tricyclic antidepressant distinctly different from SSRIs.  Some patients respond better to them.  She has a history of previous benefit.  It makes sense to retry that medication.  We discussed the pros and cons of combining it with Luvox but just not appropriate while she is on paroxetine.  We discussed the value of doing blood levels with tricyclic antidepressants like clomipramine.  She agrees to retry that medication.  5-HTP supplement 200 mg daily  ADD still better with Concerta 72 mg versus Focalin. . . Continue Luvox 100 + clomipramine 50 mg for longer trial  Lab 02/20/21 clomipramine 50 + Luvox 100 = clomip 195 +  norclomip 111= total 306  She is continuing psychotherapy with Dr. Farrel Demark  Discussed potential metabolic side effects associated with atypical antipsychotics, as well as potential risk for movement side effects. Advised pt to contact office if movement side effects occur.  She agrees with the plan  Mouth tremor appears unchanged since the switch from risperidone to olanzapine.  We will continue to monitor.  TMS was discussed again. What if it goes wrong?    Follow-up in 4-5 weeks DT severity of sx.  Meredith Staggers MD, DFAPA  Future Appointments  Date Time Provider Department Center  06/01/2021  4:00 PM Robley Fries, PhD [facility] None  06/21/2021  8:15 AM [facility] None  06/26/2021  8:45 AM Reather Littler, MD [facility] None  02/20/2022  3:30 PM Wyline Beady A, NP [facility] None    No orders of the defined types were placed in this encounter.     -------------------------------

## 2021-05-27 ENCOUNTER — Other Ambulatory Visit: Payer: Self-pay | Admitting: Psychiatry

## 2021-05-27 DIAGNOSIS — F332 Major depressive disorder, recurrent severe without psychotic features: Secondary | ICD-10-CM

## 2021-05-27 DIAGNOSIS — F422 Mixed obsessional thoughts and acts: Secondary | ICD-10-CM

## 2021-06-01 ENCOUNTER — Other Ambulatory Visit: Payer: Self-pay

## 2021-06-01 ENCOUNTER — Ambulatory Visit (INDEPENDENT_AMBULATORY_CARE_PROVIDER_SITE_OTHER): Payer: BC Managed Care – PPO | Admitting: Psychiatry

## 2021-06-01 DIAGNOSIS — R69 Illness, unspecified: Secondary | ICD-10-CM | POA: Diagnosis not present

## 2021-06-01 DIAGNOSIS — F4010 Social phobia, unspecified: Secondary | ICD-10-CM

## 2021-06-01 DIAGNOSIS — E638 Other specified nutritional deficiencies: Secondary | ICD-10-CM

## 2021-06-01 DIAGNOSIS — F332 Major depressive disorder, recurrent severe without psychotic features: Secondary | ICD-10-CM

## 2021-06-01 DIAGNOSIS — F422 Mixed obsessional thoughts and acts: Secondary | ICD-10-CM | POA: Diagnosis not present

## 2021-06-01 NOTE — Progress Notes (Signed)
Psychotherapy Progress Note [facility] [name], PhD LP  Patient ID: Ashlee Chavez     MRN: [id number] Therapy format: Individual psychotherapy Date: 06/01/2021      Start: 4:19p     Stop: 5:05p     Time Spent: 46 min Location: In-person   Session narrative (presenting needs, interim history, self-report of stressors and symptoms, applications of prior therapy, status changes, and interventions made in session) Continues to enjoy a more manageable load of housekeeping and freedom now that the unhealthy elder cat is gone.  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  Phobic of engaging still, but overall starting to branch back out.  Encouraged in continuing to seek interactions with others, air out her time.  Is improving nutrition since resolving the cat situation and adjusting meds and continuing to get some better sleep.  Explored possibilities for livening up things in her life now that she is surfacing from an extended depression and period of obsessive obligations.  No clear ideas for how, but reminded her that she used to bring a great sense of humor before all the changes of difficult relationships, loss of her father, and loss of her close friend.  Discussed possibilities of media she might enjoy on her own time if not inclined to socialize, per se.  Realizes she used to enjoy music some, fell off over the last several years.  At work, less bound by obsessions about making mistakes but still prone to them.  Encouraged continuing willingness to commit "fake" mistakes to clarify the difference between really committing and the nagging doubt OCD creates.  Therapeutic modalities: Cognitive Behavioral Therapy,  Solution-Oriented/Positive Psychology, and Ego-Supportive  Mental Status/Observations:  Appearance:   Casual and Neat     Behavior:  Appropriate  Motor:  Normal  Speech/Language:   Clear and Coherent  Affect:  Appropriate and less tentative, more responsive  Mood:  anxious  Thought process:  normal  Thought content:    WNL  Sensory/Perceptual disturbances:    WNL  Orientation:  Fully oriented  Attention:  Good    Concentration:  Good  Memory:  WNL  Insight:    Good  Judgment:   Good  Impulse Control:  Good   Risk Assessment: Danger to Self: No Self-injurious Behavior: No Danger to Others: No Physical Aggression / Violence: No Duty to Warn: No Access to Firearms a concern: No  Assessment of progress:  progressing  Diagnosis:   ICD-10-CM   1. Major depressive disorder, recurrent severe without psychotic features (HCC)  F33.2    improving clearly    2. Social anxiety disorder  F40.10     3. Mixed obsessional thoughts and acts  F42.2     4. R/O B12/folate deficiencies, anemia, monoamine synthesis problem  R69     5. Imbalanced nutrition  E63.8    improving      Plan:  Try music therapy -- Pandora or Spotify -- and perhaps sample some female comics, especially if dry wit (suggestion Tig Notaro). Maintain willingness to socialize with known quantities in her life Other recommendations/advice as may be noted above Continue to utilize previously learned skills ad lib Maintain medication as prescribed and work faithfully with relevant prescriber(s) if any changes  are desired or seem indicated Call the clinic on-call service, present to ER, or call 911 if any life-threatening psychiatric crisis Return for time as available. Already scheduled visit in this office 06/22/2021.  Robley Fries, PhD [name], PhD LP Clinical Psychologist, [facility] [address], [address] [place], [place] [address] [phone]

## 2021-06-06 ENCOUNTER — Other Ambulatory Visit: Payer: Self-pay | Admitting: Psychiatry

## 2021-06-06 DIAGNOSIS — F332 Major depressive disorder, recurrent severe without psychotic features: Secondary | ICD-10-CM

## 2021-06-06 DIAGNOSIS — F422 Mixed obsessional thoughts and acts: Secondary | ICD-10-CM

## 2021-06-07 DIAGNOSIS — E7800 Pure hypercholesterolemia, unspecified: Secondary | ICD-10-CM | POA: Diagnosis not present

## 2021-06-07 DIAGNOSIS — Z23 Encounter for immunization: Secondary | ICD-10-CM | POA: Diagnosis not present

## 2021-06-07 DIAGNOSIS — Z0000 Encounter for general adult medical examination without abnormal findings: Secondary | ICD-10-CM | POA: Diagnosis not present

## 2021-06-13 ENCOUNTER — Other Ambulatory Visit: Payer: Self-pay

## 2021-06-13 MED ORDER — TOPIRAMATE 100 MG PO TABS
100.0000 mg | ORAL_TABLET | Freq: Two times a day (BID) | ORAL | 0 refills | Status: DC
Start: 2021-06-13 — End: 2021-09-10

## 2021-06-20 DIAGNOSIS — H9313 Tinnitus, bilateral: Secondary | ICD-10-CM | POA: Diagnosis not present

## 2021-06-20 DIAGNOSIS — H903 Sensorineural hearing loss, bilateral: Secondary | ICD-10-CM | POA: Diagnosis not present

## 2021-06-21 ENCOUNTER — Other Ambulatory Visit (INDEPENDENT_AMBULATORY_CARE_PROVIDER_SITE_OTHER): Payer: BC Managed Care – PPO

## 2021-06-21 ENCOUNTER — Other Ambulatory Visit: Payer: Self-pay

## 2021-06-21 DIAGNOSIS — E063 Autoimmune thyroiditis: Secondary | ICD-10-CM | POA: Diagnosis not present

## 2021-06-21 LAB — TSH: TSH: 1 u[IU]/mL (ref 0.35–5.50)

## 2021-06-21 LAB — T4, FREE: Free T4: 1.33 ng/dL (ref 0.60–1.60)

## 2021-06-22 ENCOUNTER — Encounter: Payer: Self-pay | Admitting: Psychiatry

## 2021-06-22 ENCOUNTER — Ambulatory Visit: Payer: BC Managed Care – PPO | Admitting: Psychiatry

## 2021-06-22 DIAGNOSIS — F4010 Social phobia, unspecified: Secondary | ICD-10-CM

## 2021-06-22 DIAGNOSIS — F422 Mixed obsessional thoughts and acts: Secondary | ICD-10-CM

## 2021-06-22 DIAGNOSIS — F332 Major depressive disorder, recurrent severe without psychotic features: Secondary | ICD-10-CM

## 2021-06-22 DIAGNOSIS — E638 Other specified nutritional deficiencies: Secondary | ICD-10-CM | POA: Diagnosis not present

## 2021-06-22 MED ORDER — OLANZAPINE 10 MG PO TABS: 10.0000 mg | ORAL_TABLET | Freq: Every day | ORAL | 0 refills | Status: DC

## 2021-06-22 MED ORDER — CLOMIPRAMINE HCL 25 MG PO CAPS: 75.0000 mg | ORAL_CAPSULE | Freq: Every day | ORAL | 0 refills | Status: DC

## 2021-06-22 NOTE — Progress Notes (Signed)
Ashlee Chavez [id number] [birth year] 60 y.o.  Subjective:   Patient ID:  Ashlee Chavez is a 60 y.o. (DOB 1961/09/09) female.  Chief Complaint:  Chief Complaint  Patient presents with   Follow-up   Depression   Anxiety   Medication Problem    Depression        Associated symptoms include headaches.  Associated symptoms include no decreased concentration, no fatigue, no appetite change and no suicidal ideas. Ashlee Chavez presents today for follow-up of recently worse depression DT relationship loss.    When seen February 10, 2019.  She had received some benefit from Ritalin 20 mg 3 times daily but was having issues with the crash.  We switched to Concerta 72 mg every morning.  September 14, 2019.  She was having persistent bothersome OCD and was interested in further medicine changes to try to help with these symptoms because she has aggressively pursued therapy and still has significant symptoms.  She has been on multiple SSRIs and so we initiated augmentation with risperidone half milligram daily to increase to 1 mg daily. Sig improvement with risperidone.  SE low appetite with it.  Not hungry for dinner. Sleep so much better also.  Improvement is "marked".  Think better bc less obsessing time.  Not constant now.  Noticed benefit within the first week.  Less obsessing lessened depression.  But residual symptoms.  visit October 14, 2019.  She was encouraged to experiment with the dosing of the risperidone up to 2 mg or 3 mg nightly to see if she could get additional reduction in her chronic anxiety and OCD.   seen December 15, 2019.  Settled at 2 mg risperidone bc increased sleep.  Sleep is so much better.  Harder to get up in the morning.   Found out insurance won't cover Concerta anymore.  Not as good on Ritalin TID.  Not as even a response.  Terrible insurance. Therefore the only med change with switching to Focalin to see if it would be covered better.  Appt 03/09/20, switch to  Concerta 72 mg is better with better mood and motivation vs Focalin.  Focus is also better.  Very poor function with Focalin and better with Concerta.   Better overall with more even response to stimulant and better appetite.  No SE.  Duration all day.  Less desperate hopeless feelings on the stimulant.  Stressful times. With increase risperidone a little better mood bump.  SE a little shakiness noticed in jaw.   Not unusual levels of anxiety.  Lost her best friend 1 year ago and still grieving.  Not much family. Alone.  Not close to B and S who live elsewhere.  Talks to God daily.  Gets to work daily.  Function is good there.  Reads and calls people.  OCD no worse with Covid. Did join a Bible study which was a huge step. Plan start lamotrigine   04/04/20 appt with the following noted: Appt moved up.  She thought she was supposed to stop risperidone when started lamotrigine.  Started having SI off the risperidone but wasn't sure if it was SE.  No risperidone 3 mg for 3 weeks.Anxiety shaking sweating crying all started right away.  Also trouble with sleep and can't eat.  Plan:Restart risperidone 1 mg now and another milligram in 2 to 3 hours if not feeling sufficient relief to get the full dose of 3 mg today.  Then resume 3 mg risperidone each evening. Restart lamotrigine 25  mg daily for 2 weeks and follow the previous instructions on increasing it.  05/11/2020 appointment the following is noted: Marked improvement back on risperidone 3 mg.  SE shakes jaw.  Doesn't affect typing. Started lamotrigine and up to 100 mg for 2 weeks.  Feels it's helped depression. Better mood and able to laugh.  Able to get pizza for Bible study and not cry. Apptetite is not great.  Working on it. Anxiety is better not gone. Plan: no med changes  07/26/20 appt with the following noted; Increased lamotrigine gradually to 200 mg daily over the phone 6 weeks ago.  No improvement really for depression. With it. CC  depression.   Risperidone still helping anxiety. Plan: Increase lamotrigine to 300 mg daily..  09/14/2020 appointment with following noted: Started taper of lamotrigine DT NR. Started latuda 20 mg daily for 10 days.  Tolerated fine.  No effects so far.. Main problem is still depression.  Anxiety is not too bad.  No energy, motivation, sleeeping a lot.  No sig SI. In past started risperidone for anxiety and depression.  Increase Latuda to 40 mg daily. Disc potential DDI with risperidone reducing effectiveness of  Latuda so reduc risperidone to 1.5 mg HS  10/09/2020 phone call from patient: Patient left a voicemail on Friday while we were out of office for the Holiday that she is not any better. She feels the cutting in the 1/2 of Risperidone has really increased her anxiety. She was instructed to call back if not any better but concerned that taking away the Risperidone will really exacerbate her anxiety.  MD response:  At the last appointment we increased Latuda to 40 mg daily and reduced risperidone to half tablet daily.  The goal was to use Latuda and eliminate risperidone.  However since her anxiety has worsened with the reduction in risperidone, I agree with her and she should take the full dose of risperidone as well as continue the Latuda 40 mg daily.  The [name] is intended to help depression.  11/23/2020 appointment with the following noted: Propranolol only once daily. No better with higher Latuda.  No SE either. No change in appetite which is poor.  Still struggling with depression and OCD is a little worse with changes at work. Off lamotrigine. Plan: 5-HTP supplement 200 mg daily  Increase Latuda to 60 mg  for 3 weeks, If no benefit and no SE increase to 80 mg daily with food.  ADD still better with Concerta 72 mg versus Focalin. Continue high dosage paroxetine 80 mg daily.  01/16/2021 appointment with following noted: Increase Latuda to 80 mg without benefit or SE.  Anxiety and  depression with poor STM.  Food getting caught in her throat.  No change in eating.  No change in weight.   Anxious thoughts mostly about work even when not there.  Anxious about work needed on the house she can't afford.  Enjoys reading but feels guilty that she should be doing something else.  More general anxiety than OCD. 5-HTP supplement 200 mg daily Wean Latuda over a week DT NR Reduce mirtazapine to 30 mg HS  ADD still better with Concerta 72 mg versus Focalin. Start clomipramine 25 mg capsules 1 at night for 1 week, Then increase clomipramine to 2 capsules at night and reduce paroxetine to 1-1/2 tablets or 60 mg daily. Wait 1 week and then get the blood test   03/15/2021 appt noted: OK not great.  About the same.  Not much change  in the last 8 weeks. SE none Lab 02/20/21 clomipramine 50 + Luvox 100 = clomip 195 + norclomip 111= total 306 Losing weight and no appetite and hard to eat. Weight loss to 107#. A lot of anxiety and depression   Sleep less sound with less mirtazapine.  Plan: Due to the severity of symptoms,Add olanzapine 5 mg tablet 1 at night Stop mirtazapine Reduce risperidone to 1/2 tablet for 1 week then stop it.  Call if SI recurs.  03/30/2021 appointment with the following noted: It makes me a little tired but so far anxiety is improved.  No problems off risperidone.  Appetite is not better but did get protein bars and is eating.   Less panic.  But still occurs.  Things just started this week to be a little better.  Not huge but it's a start.  A little more energy. Sleep 10 hours but that's unchanged with olanzapine. First time I've had hope in a long time. Plan: A little better with olanzapine 5 mg HS vs risperidone 3 mg. Increase olanzapine to 10 mg HS  Call in 2 weeks if you are not seeing progressive improvement  04/12/2021 appointment with the following noted: Better with anxiety.  Sleep better.  Daytime could nap if not busy.  Otherwise OK.  Not much change in  appetite.  Up to 110# about 2.5# better and trying to get protein twice daily.   A little change in OCD.   Depression is better some and staying up later and doing more than she was.  Doing some things after dinner which is huge.  Big change with depression.  Not retreating to bed as quickly. Plan: continue meds.  05/18/21 appt noted: Further improvement in anxiety.  I feel better but still not going out but not excessively sleeping.  Acting more normally, eating better.  Eating dinner again.  Sleep reduced to 8 hours. SE dry mouth and a little hungrier. Asks about going up on olanzapine to further reduce anxiety. [name] and describes better from 10/10 to 4/10 for anxiety and depression down to 3/10.Marland Kitchen  Anxiety symptoms include: Excessive Worry, Obsessive Compulsive Symptoms:   Handwashing,,.  Pt reports no sleep issues. Pt reports that appetite is good. Pt reports that energy is good and down slightly. Concentration is no change. Suicidal thoughts:  denied by patient. Plan increase olanzapine to 15 mg HS DT partial benefit  06/22/21 appt noted: Too tired and sleepy with 15 mg olanzapine and reduced the dose to 10 mg daily. No other med changes. Overall about the same.  Don't do anything different.  Happy that it's better than it was stays up to 930 until 6 PM.  Don't go out except Bible study and church once weekly. Goes to grocery early to avoid people. Anxiety lately worse than the depression. Dx pulsatile tinnitus  Numerous failed psychiatric medication trials include Lexapro 60 mg a day, Zoloft 250, Paxil with weight gain,  Fluvoxamine 100 Clomipramine50,  Wellbutrin 300 with side effects,  mirtazapine Rexulti which helped initially,  Vraylar, Abilify between 1 and 2.5 mg and up to 15 mg with no response,  Latuda 80 failed Risperidone Olanzapine 15 sedation. Lamotrigine 300 NR buspirone 30 mg,  Cytomel,  lithium 625,  pramipexole with  insomnia,   Lunesta with crying spells,  topiramate, Deplin, propranolol,   Ritalin, Focalin NR, Concerta 72 helped No history of Prozac, Viibryd.  Review of Systems:  Review of Systems  Constitutional:  Negative for activity change, appetite change, diaphoresis, fatigue and unexpected weight change.  HENT:  Positive for tinnitus.   Cardiovascular:  Negative for palpitations.  Gastrointestinal:  Negative for abdominal pain.  Neurological:  Positive for tremors and headaches. Negative for dizziness, weakness and light-headedness.       Mouth  Psychiatric/Behavioral:  Negative for agitation, behavioral problems, confusion, decreased concentration, hallucinations, self-injury, sleep disturbance and suicidal ideas. The patient is not hyperactive.    Medications: I have reviewed the patient's current medications.  Current Outpatient Medications  Medication Sig Dispense Refill   ALPRAZolam (XANAX) 0.25 MG tablet Take 1 tablet (0.25 mg total) by mouth daily as needed for anxiety. 30 tablet 0   atorvastatin (LIPITOR) 20 MG tablet TAKE ONE TABLET BY MOUTH ONCE DAILY 90 tablet 1   Calcium Carbonate-Vitamin D (OSCAL 500/200 D-3 PO) Take by mouth.     Cholecalciferol (VITAMIN D PO) Take by mouth.     fluvoxaMINE (LUVOX) 100 MG tablet TAKE 1 TABLET BY MOUTH EVERYDAY AT BEDTIME (Patient taking differently: 1/2 in am 1/2 in pm) 90 tablet 0   levothyroxine (SYNTHROID) 100 MCG tablet TAKE 1 TABLET BY MOUTH EVERY DAY 90 tablet 1   methylphenidate (CONCERTA) 36 MG PO CR tablet Take 2 tablets (72 mg total) by mouth daily. 60 tablet 0   propranolol (INDERAL) 20 MG tablet TAKE 1 TO 2 TABLETS TWICE A DAY AS NEEDED FOR TREMOR 100 tablet 3   risedronate (ACTONEL) 150 MG tablet TAKE 1 TABLET EVERY 30 DAYS WITH WATER ON EMPTY STOMACH, NOTHING BY MOUTH OR LIE DOWN FOR 3 tablet 1   SUMAtriptan (IMITREX) 100 MG tablet Take 100 mg by mouth daily as needed for migraine.     topiramate (TOPAMAX) 100 MG tablet  Take 1 tablet (100 mg total) by mouth 2 (two) times daily. 180 tablet 0   clomiPRAMINE (ANAFRANIL) 25 MG capsule Take 3 capsules (75 mg total) by mouth at bedtime. 270 capsule 0   OLANZapine (ZYPREXA) 10 MG tablet Take 1 tablet (10 mg total) by mouth at bedtime. 90 tablet 0   No current facility-administered medications for this visit.    Medication Side Effects:  Watch jaw tremor  Allergies:  Allergies  Allergen Reactions   Ampicillin Rash    Past Medical History:  Diagnosis Date   Gum lesion    Biopsy done-benign   Hypercholesteremia    Hypothyroid    LGSIL (low grade squamous intraepithelial dysplasia) 2010   Migraines    OCD (obsessive compulsive disorder)    Osteoporosis 01/2016   T score -2.7    Family History  Problem Relation Age of Onset   Hypertension Mother    Heart disease Mother    Hypertension Father    Breast cancer Paternal Aunt        Age 33   Diabetes Neg Hx     Social History   Socioeconomic History   Marital status: Single    Spouse name: Not on file   Number of children: Not on file   Years of education: Not on file   Highest education level: Not on file  Occupational History   Not on file  Tobacco Use   Smoking status: Never   Smokeless tobacco: Never  Vaping Use   Vaping Use: Never used  Substance and Sexual Activity   Alcohol use: Yes    Alcohol/week: 0.0 standard drinks    Comment: wine occassionally   Drug use: No   Sexual activity:  Not Currently    Birth control/protection: Post-menopausal    Comment: 1st intercourse 83 yo-5 partners  Other Topics Concern   Not on file  Social History Narrative   Not on file   Social Determinants of Health   Financial Resource Strain: Not on file  Food Insecurity: Not on file  Transportation Needs: Not on file  Physical Activity: Not on file  Stress: Not on file  Social Connections: Not on file  Intimate Partner Violence: Not on file    Past Medical History, Surgical history,  Social history, and Family history were reviewed and updated as appropriate.   Please see review of systems for further details on the patient's review from today.   Objective:   Physical Exam:  BP 94/60   Pulse 75   LMP 11/19/2010   Physical Exam Constitutional:      General: She is not in acute distress.    Appearance: Normal appearance. She is well-developed.  Musculoskeletal:        General: No deformity.  Neurological:     Mental Status: She is alert and oriented to person, place, and time.     Motor: Tremor present.     Coordination: Coordination normal.     Comments: Mild mouth tremor  Psychiatric:        Attention and Perception: She is attentive. She does not perceive auditory hallucinations.        Mood and Affect: Mood is anxious and depressed. Affect is not labile, blunt, angry or inappropriate.        Speech: Speech is delayed.        Behavior: Behavior is slowed.        Thought Content: Thought content is not paranoid. Thought content does not include homicidal or suicidal ideation. Thought content does not include homicidal or suicidal plan.        Cognition and Memory: Cognition normal.        Judgment: Judgment normal.     Comments: Insight intact. No auditory or visual hallucinations. No delusions. Less checking. Severe sx further better with both anxiety and depression.  Currently anxiety is the worst of the 2    Lab Review:     Component Value Date/Time   NA 139 11/14/2020 0804   K 4.0 11/14/2020 0804   CL 104 11/14/2020 0804   CO2 27 11/14/2020 0804   GLUCOSE 96 11/14/2020 0804   BUN 15 11/14/2020 0804   CREATININE 0.82 11/14/2020 0804   CALCIUM 9.8 11/14/2020 0804   PROT 6.9 11/14/2020 0804   ALBUMIN 4.6 11/14/2020 0804   AST 19 11/14/2020 0804   ALT 19 11/14/2020 0804   ALKPHOS 41 11/14/2020 0804   BILITOT 0.4 11/14/2020 0804       Component Value Date/Time   WBC 5.9 01/29/2016 0834   RBC 4.85 01/29/2016 0834   HGB 15.2 (H) 01/29/2016  0834   HCT 46.2 (H) 01/29/2016 0834   PLT 232.0 01/29/2016 0834   MCV 95.3 01/29/2016 0834   MCHC 33.0 01/29/2016 0834   RDW 14.7 01/29/2016 0834   LYMPHSABS 1.3 01/29/2016 0834   MONOABS 0.4 01/29/2016 0834   EOSABS 0.3 01/29/2016 0834   BASOSABS 0.0 01/29/2016 0834    No results found for: POCLITH, LITHIUM   No results found for: PHENYTOIN, PHENOBARB, VALPROATE, CBMZ   .res Assessment: Plan:    Jaren was seen today for follow-up, depression, anxiety and medication problem.  Diagnoses and all orders for this visit:  Major depressive  disorder, recurrent severe without psychotic features (HCC) -     clomiPRAMINE (ANAFRANIL) 25 MG capsule; Take 3 capsules (75 mg total) by mouth at bedtime. -     OLANZapine (ZYPREXA) 10 MG tablet; Take 1 tablet (10 mg total) by mouth at bedtime.  Mixed obsessional thoughts and acts -     clomiPRAMINE (ANAFRANIL) 25 MG capsule; Take 3 capsules (75 mg total) by mouth at bedtime. -     OLANZapine (ZYPREXA) 10 MG tablet; Take 1 tablet (10 mg total) by mouth at bedtime.  Social anxiety disorder -     OLANZapine (ZYPREXA) 10 MG tablet; Take 1 tablet (10 mg total) by mouth at bedtime.  Imbalanced nutrition -     OLANZapine (ZYPREXA) 10 MG tablet; Take 1 tablet (10 mg total) by mouth at bedtime.  We discussed TRD and anxiety . she has a long list of failed psychiatric meds.  She has not tended to do well with higher dosages of SSRIs.  She is afraid of doing TMS.  She knows her fears are exaggerated and irrational. Patient has been markedly depressed and anxious.   Since adding olanzapine 10 mg she has seen a significant improvement in depression and anxiety but the symptoms are still pretty debilitating.  We discussed the pros and cons of increasing the dose and will increase to 15 mg HS which was too sedating so cut olanzapine back to 10 mg HS  Disc difference between depression and situational sadness.  Chart review revealed that prior to starting  treatment here patient had taken clomipramine in the past with some benefit but also with some side effects.  It is a tricyclic antidepressant distinctly different from SSRIs.  Some patients respond better to them.  She has a history of previous benefit.  It makes sense to retry that medication.  We discussed the pros and cons of combining it with Luvox but just not appropriate while she is on paroxetine.  We discussed the value of doing blood levels with tricyclic antidepressants like clomipramine.  She agrees.  Lab 02/20/21 clomipramine 50 + Luvox 100 = clomip 195 + norclomip 111= total 306 (normal 220-500) Increase clomipramine to 75 mg HS Consider increasing fluvoxamine.  5-HTP supplement 200 mg daily  ADD still better with Concerta 72 mg versus Focalin. . . She is continuing psychotherapy with Dr. Farrel DemarkMitchum  Discussed potential metabolic side effects associated with atypical antipsychotics, as well as potential risk for movement side effects. Advised pt to contact office if movement side effects occur.  She agrees with the plan  Disc dx pulsatile tinnitus and possible treatment of gabapentin.  Mouth tremor appears unchanged since the switch from risperidone to olanzapine.  We will continue to monitor.  TMS and Spravato should be considered options.  Follow-up in 4-5 weeks DT severity of sx.  Meredith Staggersarey Cottle MD, DFAPA  Future Appointments  Date Time Provider Department Center  06/26/2021  8:45 AM Reather LittlerKumar, Ajay, MD [facility] None  07/24/2021  4:00 PM Robley FriesMitchum, Robert, PhD [facility] None  08/13/2021  4:30 PM Cottle, Steva Readyarey G Jr., MD [facility] None  02/20/2022  3:30 PM Olivia MackieWallace, Tiffany A, NP [facility] None    No orders of the defined types were placed in this encounter.     -------------------------------

## 2021-06-26 ENCOUNTER — Ambulatory Visit: Payer: BC Managed Care – PPO | Admitting: Endocrinology

## 2021-06-26 ENCOUNTER — Other Ambulatory Visit: Payer: Self-pay

## 2021-06-26 ENCOUNTER — Encounter: Payer: Self-pay | Admitting: Endocrinology

## 2021-06-26 VITALS — BP 100/72 | HR 88 | Ht 63.0 in | Wt 114.4 lb

## 2021-06-26 DIAGNOSIS — M810 Age-related osteoporosis without current pathological fracture: Secondary | ICD-10-CM | POA: Diagnosis not present

## 2021-06-26 DIAGNOSIS — R7301 Impaired fasting glucose: Secondary | ICD-10-CM

## 2021-06-26 DIAGNOSIS — E063 Autoimmune thyroiditis: Secondary | ICD-10-CM | POA: Diagnosis not present

## 2021-06-26 DIAGNOSIS — E7800 Pure hypercholesterolemia, unspecified: Secondary | ICD-10-CM

## 2021-06-26 NOTE — Patient Instructions (Signed)
Stop actonel.

## 2021-06-26 NOTE — Progress Notes (Signed)
Patient ID: Ashlee Chavez, female   DOB: 22-Mar-1961, 60 y.o.   MRN: [id number]           Chief complaint: Endocrinology follow-up  History of Present Illness:    OSTEOPOROSIS:   She had a bone density done in 2017 and this showed the following T-scores: Femoral neck: -2.7, previously -1.8 Spine: -1.9  By comparison she had a decline of 8-10 % in her bone density from 2 years before and 15% result from baseline She initially had a half inch height loss from baseline  She could not afford Evista and did not want to consider Reclast because of the possible high one time cost  In 05/2016 she was started on treatment with risedronate 150 mg for her declining bone density  She is taking this once a month on empty stomach regularly every month without side effects. She has just finished her prescription  She is taking calcium supplements, regular on Os-Cal. Also regular with taking 5000 units of vitamin D with consistently normal vitamin D levels  BONE density: Repeat study done by gynecologist in April 2021 shows T-score of the femoral neck to be -2.4 compared to -2.7  LABS:  Lab Results  Component Value Date   VD25OH 47.92 11/14/2020   VD25OH 50.65 05/02/2020    HYPOTHYROIDISM:  She had a goiter diagnosed in 1996. She had a positive peroxidase antibody and has been on thyroxine since 1996  Her TSH was high in 2006 and she had symptoms of fatigue, cold intolerance and some hair loss  Since then has been on 75 mcg of Synthroid with occasional adjustments in the dose  Since 01/2014 she had been taking the same dose of levothyroxine, 75 g daily  Her TSH was higher than usual in 11/16 and she was having complaints about depression, insomnia, fatigue and more difficulties with her anxiety  Has had chronic cold sensitivity   Her dose was increased in 7/17 and in 1/19 it was further increased to to 137 mcg because of a high TSH She was not having any unusual fatigue but her TSH  was gradually increasing She was initially having some palpitations and shakiness but no symptoms on the visit when she was on 125 mcg  Recent history:  Her levothyroxine dose has been reduced progressively more recently  Prior to her visit in 6/21 she has lost weight because of anxiety and decreased appetite Also her TSH was down to 0.14  Since 8/21 has been taking 100 mcg levothyroxine 6-1/2 tablets a week with the half tablet on Sundays TSH is again normal  Does not have as much fatigue lately and has been gaining back some weight with better appetite She does have a history of a small goiter  He has been regular with taking the levothyroxine in the morning before breakfast.  She will take her multivitamin and calcium separately   Wt Readings from Last 3 Encounters:  06/26/21 114 lb 6.4 oz (51.9 kg)  02/19/21 109 lb (49.4 kg)  11/16/20 107 lb 6.4 oz (48.7 kg)    Lab Results  Component Value Date   TSH 1.00 06/21/2021   TSH 0.65 11/14/2020   TSH 0.23 (L) 07/10/2020   FREET4 1.33 06/21/2021   FREET4 0.96 11/14/2020   FREET4 1.13 07/10/2020    OTHER active problems discussed and review of systems   Past Medical History:  Diagnosis Date   Gum lesion    Biopsy done-benign   Hypercholesteremia    Hypothyroid  LGSIL (low grade squamous intraepithelial dysplasia) 2010   Migraines    OCD (obsessive compulsive disorder)    Osteoporosis 01/2016   T score -2.7    Past Surgical History:  Procedure Laterality Date   BREAST LUMPECTOMY  2001   right-benign   CATARACT EXTRACTION  2005   Gum Biopsy     Benign-2019  `  Family History  Problem Relation Age of Onset   Hypertension Mother    Heart disease Mother    Hypertension Father    Breast cancer Paternal Aunt        Age 23   Diabetes Neg Hx     Social History:  reports that she has never smoked. She has never used smokeless tobacco. She reports current alcohol use. She reports that she does not use  drugs.  Allergies:  Allergies  Allergen Reactions   Ampicillin Rash    Allergies as of 06/26/2021       Reactions   Ampicillin Rash        Medication List        Accurate as of June 26, 2021  9:00 AM. If you have any questions, ask your nurse or doctor.          ALPRAZolam 0.25 MG tablet Commonly known as: XANAX Take 1 tablet (0.25 mg total) by mouth daily as needed for anxiety.   atorvastatin 20 MG tablet Commonly known as: LIPITOR TAKE ONE TABLET BY MOUTH ONCE DAILY   clomiPRAMINE 25 MG capsule Commonly known as: ANAFRANIL Take 3 capsules (75 mg total) by mouth at bedtime.   fluvoxaMINE 100 MG tablet Commonly known as: LUVOX TAKE 1 TABLET BY MOUTH EVERYDAY AT BEDTIME   levothyroxine 100 MCG tablet Commonly known as: SYNTHROID TAKE 1 TABLET BY MOUTH EVERY DAY   methylphenidate 36 MG CR tablet Commonly known as: Concerta Take 2 tablets (72 mg total) by mouth daily.   OLANZapine 10 MG tablet Commonly known as: ZyPREXA Take 1 tablet (10 mg total) by mouth at bedtime.   OSCAL 500/200 D-3 PO Take by mouth.   propranolol 20 MG tablet Commonly known as: INDERAL TAKE 1 TO 2 TABLETS TWICE A DAY AS NEEDED FOR TREMOR   risedronate 150 MG tablet Commonly known as: ACTONEL TAKE 1 TABLET EVERY 30 DAYS WITH WATER ON EMPTY STOMACH, NOTHING BY MOUTH OR LIE DOWN FOR   SUMAtriptan 100 MG tablet Commonly known as: IMITREX Take 100 mg by mouth daily as needed for migraine.   topiramate 100 MG tablet Commonly known as: TOPAMAX Take 1 tablet (100 mg total) by mouth 2 (two) times daily.   VITAMIN D PO Take by mouth.         Review of Systems    HYPERCHOLESTEROLEMIA: She has been on Lipitor 20 mg for several years  LDL generally controlled and recently around 100, now being followed by PCP also She has no other risk factors besides hypercholesterolemia  She tries to follow a low-fat diet  Lab Results  Component Value Date   CHOL 187  11/14/2020   HDL 74.80 11/14/2020   LDLCALC 92 11/14/2020   TRIG 103.0 11/14/2020   CHOLHDL 3 11/14/2020    GLUCOSE: Her fasting glucose has occasionally been high in the past Recently glucose 80 from PCP  Although A1c previously was in prediabetic range it is last normal   No family history of diabetes  Lab Results  Component Value Date   HGBA1C 5.5 05/02/2020   HGBA1C 6.1 10/26/2019  HGBA1C 6.1 04/13/2019   Lab Results  Component Value Date   LDLCALC 92 11/14/2020   CREATININE 0.82 11/14/2020      PHYSICAL EXAM:  BP 100/72   Pulse 88   Ht 5\' 3"  (1.6 m)   Wt 114 lb 6.4 oz (51.9 kg)   LMP 11/19/2010   SpO2 98%   BMI 20.27 kg/m   Right thyroid lobe is palpable and enlarged 1-1/2 times normal, firm and slightly irregular.  Left lobe not palpable Biceps reflexes appear normal  ASSESSMENT:    HYPOTHYROIDISM:   She has longstanding primary hypothyroidism with a small goiter, likely from Hashimoto's thyroiditis  She is taking 6-1/2 tablets a week of the 100 mcg levothyroxine No new symptoms Has only a small goiter which is unchanged on the right side  Her TSH is consistently back to normal   Osteoporosis: Bone density was improved as of 4/21 She is on Actonel and has taken this for 5 years  Lipids: Well-controlled  History of weight loss: Improved with better control of anxiety   PLAN:  She will stay on 100 mcg levothyroxine, 6-1/2 tablets/week Stop Actonel now for a 1 year drug holiday Continue vitamin D3, 5000 units daily and recheck labs on the next visit  Consider doing bone density next year before restarting  Follow-up in 6 months    Ayoub Arey 06/26/2021, 9:00 AM

## 2021-06-29 ENCOUNTER — Other Ambulatory Visit: Payer: Self-pay | Admitting: Otolaryngology

## 2021-06-29 DIAGNOSIS — H93A9 Pulsatile tinnitus, unspecified ear: Secondary | ICD-10-CM

## 2021-07-13 ENCOUNTER — Ambulatory Visit
Admission: RE | Admit: 2021-07-13 | Discharge: 2021-07-13 | Disposition: A | Discharge status: Home / Self Care | Payer: BC Managed Care – PPO | Source: Ambulatory Visit | Attending: Otolaryngology | Admitting: Otolaryngology

## 2021-07-13 DIAGNOSIS — J3489 Other specified disorders of nose and nasal sinuses: Secondary | ICD-10-CM | POA: Diagnosis not present

## 2021-07-13 DIAGNOSIS — H93A9 Pulsatile tinnitus, unspecified ear: Secondary | ICD-10-CM

## 2021-07-13 MED ORDER — GADOBENATE DIMEGLUMINE 529 MG/ML IV SOLN
10.0000 mL | Freq: Once | INTRAVENOUS | Status: AC | PRN
  Administered 2021-07-13: 10 mL via INTRAVENOUS

## 2021-07-17 ENCOUNTER — Other Ambulatory Visit: Payer: Self-pay

## 2021-07-17 DIAGNOSIS — F900 Attention-deficit hyperactivity disorder, predominantly inattentive type: Secondary | ICD-10-CM

## 2021-07-17 MED ORDER — METHYLPHENIDATE HCL ER 36 MG PO TB24: 36.0000 mg | ORAL_TABLET | Freq: Every day | ORAL | 0 refills | Status: DC

## 2021-07-17 MED ORDER — METHYLPHENIDATE HCL ER (OSM) 36 MG PO TBCR: 72.0000 mg | EXTENDED_RELEASE_TABLET | Freq: Every day | ORAL | 0 refills | Status: DC

## 2021-07-24 ENCOUNTER — Other Ambulatory Visit: Payer: Self-pay

## 2021-07-24 ENCOUNTER — Ambulatory Visit (INDEPENDENT_AMBULATORY_CARE_PROVIDER_SITE_OTHER): Payer: BC Managed Care – PPO | Admitting: Psychiatry

## 2021-07-24 DIAGNOSIS — F4010 Social phobia, unspecified: Secondary | ICD-10-CM | POA: Diagnosis not present

## 2021-07-24 DIAGNOSIS — F5102 Adjustment insomnia: Secondary | ICD-10-CM

## 2021-07-24 DIAGNOSIS — E638 Other specified nutritional deficiencies: Secondary | ICD-10-CM

## 2021-07-24 DIAGNOSIS — F422 Mixed obsessional thoughts and acts: Secondary | ICD-10-CM

## 2021-07-24 DIAGNOSIS — F332 Major depressive disorder, recurrent severe without psychotic features: Secondary | ICD-10-CM

## 2021-07-24 DIAGNOSIS — R69 Illness, unspecified: Secondary | ICD-10-CM

## 2021-07-24 NOTE — Progress Notes (Signed)
Psychotherapy Progress Note [facility] [name], PhD LP  Patient ID: Ashlee Chavez     MRN: [id number] Therapy format: Individual psychotherapy Date: 07/24/2021      Start: 4:26p     Stop: 5:12p     Time Spent: 46 min Location: In-person   Session narrative (presenting needs, interim history, self-report of stressors and symptoms, applications of prior therapy, status changes, and interventions made in session) Giving self permission to "convalesce", she says.  Friday before Labor Day had MRI, plus challenged to make plans with her Sunday School group.  Followed through, despite apprehensions.  Notes she has had whooshing sounds in her ear, starting May.  Expected to be treated as crazy, but ENT quickly normalized and set her up for scans.  MRI normal.  CT to come.  With her high deductible plan, will be pressed for cash to pay.  Advised she can set up an HSA, actually, to shelter some of the money.  Did not know she could do that.  Suggested approach her own bank about it, as her insurance company would not necessarily operate one.  At work, has chanced to make a few billing errors, not intentional actually.  One of them was big -- entering $5900 instead of $59.00.  Was frightened, but dutifully and competently called the customer, fixed the invoice, and found that the world would not end.  Other growing pains getting through accounting software, also finding that the world would not end.  Medically, increased clomipramine.  Stopped 5-HTP supplement recently.  Is faithful with protein bar and a protein Boost daily as pat of her nutrition, which seems to be restorative .  Eating better at dinner time, weight and appearance healthier.  Remains churchgoing regularly, with the elderly couple she takes to services, regularly lunch together afterward.  "And I clean my plate."  Commended on both appetite and her sense of humor returning after a long time unavailable.     Missing Ashlee Chavez a lot lately.  Normalized that getting back to normal more also means feeling her true needs and wishes more, and missing her rock of a friend certainly counts.  3rd anniversary of Ashlee Chavez's passing coming up November.  Support/empathy provided.   Demurs about dating site and some social time.  Assured her that the spirit of how she engages or sits out of social opportunities -- choice, for good reasons either way -- matters much more than how much.    Therapeutic modalities: Cognitive Behavioral Therapy, Solution-Oriented/Positive Psychology, and Ego-Supportive  Mental Status/Observations:  Appearance:   Casual     Behavior:  Appropriate  Motor:  Normal  Speech/Language:   Clear and Coherent  Affect:  Appropriate and better energy  Mood:  anxious, depressed, and definitely better  Thought process:  normal  Thought content:    WNL  Sensory/Perceptual disturbances:    WNL  Orientation:  Fully oriented  Attention:  Good    Concentration:  Good  Memory:  WNL  Insight:    Good  Judgment:   Good  Impulse Control:  Fair   Risk Assessment: Danger to Self: No Self-injurious Behavior: No Danger to Others: No Physical Aggression / Violence: No Duty to Warn: No Access to Firearms a concern: No  Assessment of progress:  progressing well  Diagnosis:   ICD-10-CM   1. 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 Social anxiety disorder  F40.10  3. Mixed obsessional thoughts and acts  F42.2     4. Imbalanced nutrition  E63.8     5. R/O B12/folate deficiencies, anemia, monoamine synthesis problem  R69     6. Insomnia due to psychological stress  F51.02    much improved without sick cat and decontamination needs     Plan:  Apply "good reasons" perspective to choices how to spend her time -- not everything good has to be about dutiful sacrifice  Option to inquire with [employer] and parks  & rec about book clubs and walking groups Self-affirm whooshing symptom is identifiable and treatable Self-affirm mistakes with accounting not the end of the world.  Ongoing freedom to make intentional pseudomistakes as practice seeing the difference to where she would not doubt her memory or perception Other recommendations/advice as may be noted above Continue to utilize previously learned skills ad lib Maintain medication as prescribed and work faithfully with relevant prescriber(s) if any changes are desired or seem indicated Call the clinic on-call service, 988/hotline, present to ER, or call 911 if any life-threatening psychiatric crisis Return in about 6 weeks (around 09/04/2021). Already scheduled visit in this office 08/13/2021.  Robley Fries, PhD [name], PhD LP Clinical Psychologist, [facility] [address] [place], [place] [address] [phone]

## 2021-07-26 ENCOUNTER — Other Ambulatory Visit: Payer: Self-pay | Admitting: Otolaryngology

## 2021-07-26 DIAGNOSIS — H93A2 Pulsatile tinnitus, left ear: Secondary | ICD-10-CM

## 2021-08-09 ENCOUNTER — Other Ambulatory Visit: Payer: Self-pay | Admitting: Endocrinology

## 2021-08-13 ENCOUNTER — Other Ambulatory Visit: Payer: Self-pay

## 2021-08-13 ENCOUNTER — Encounter: Payer: Self-pay | Admitting: Psychiatry

## 2021-08-13 ENCOUNTER — Ambulatory Visit: Payer: BC Managed Care – PPO | Admitting: Psychiatry

## 2021-08-13 VITALS — BP 98/69 | HR 79

## 2021-08-13 DIAGNOSIS — F422 Mixed obsessional thoughts and acts: Secondary | ICD-10-CM

## 2021-08-13 DIAGNOSIS — F900 Attention-deficit hyperactivity disorder, predominantly inattentive type: Secondary | ICD-10-CM

## 2021-08-13 DIAGNOSIS — F4010 Social phobia, unspecified: Secondary | ICD-10-CM

## 2021-08-13 DIAGNOSIS — F332 Major depressive disorder, recurrent severe without psychotic features: Secondary | ICD-10-CM | POA: Diagnosis not present

## 2021-08-13 DIAGNOSIS — R251 Tremor, unspecified: Secondary | ICD-10-CM

## 2021-08-13 MED ORDER — FLUVOXAMINE MALEATE 100 MG PO TABS: 50.0000 mg | ORAL_TABLET | Freq: Two times a day (BID) | ORAL | 1 refills | Status: DC

## 2021-08-13 NOTE — Progress Notes (Signed)
[name] [id number] 1961/09/03 60 y.o.  Subjective:   Patient ID:  Ashlee Chavez is a 61 y.o. (DOB August 17, 1961) female.  Chief Complaint:  Chief Complaint  Patient presents with   Follow-up   Depression   Anxiety    Depression        Associated symptoms include headaches.  Associated symptoms include no decreased concentration, no fatigue, no appetite change and no suicidal ideas. [name] presents today for follow-up of recently worse depression DT relationship loss.    When seen February 10, 2019.  She had received some benefit from Ritalin 20 mg 3 times daily but was having issues with the crash.  We switched to Concerta 72 mg every morning.  September 14, 2019.  She was having persistent bothersome OCD and was interested in further medicine changes to try to help with these symptoms because she has aggressively pursued therapy and still has significant symptoms.  She has been on multiple SSRIs and so we initiated augmentation with risperidone half milligram daily to increase to 1 mg daily. Sig improvement with risperidone.  SE low appetite with it.  Not hungry for dinner. Sleep so much better also.  Improvement is "marked".  Think better bc less obsessing time.  Not constant now.  Noticed benefit within the first week.  Less obsessing lessened depression.  But residual symptoms.  visit October 14, 2019.  She was encouraged to experiment with the dosing of the risperidone up to 2 mg or 3 mg nightly to see if she could get additional reduction in her chronic anxiety and OCD.   seen December 15, 2019.  Settled at 2 mg risperidone bc increased sleep.  Sleep is so much better.  Harder to get up in the morning.   Found out insurance won't cover Concerta anymore.  Not as good on Ritalin TID.  Not as even a response.  Terrible insurance. Therefore the only med change with switching to Focalin to see if it would be covered better.  Appt 03/09/20, switch to Concerta 72 mg is better  with better mood and motivation vs Focalin.  Focus is also better.  Very poor function with Focalin and better with Concerta.   Better overall with more even response to stimulant and better appetite.  No SE.  Duration all day.  Less desperate hopeless feelings on the stimulant.  Stressful times. With increase risperidone a little better mood bump.  SE a little shakiness noticed in jaw.   Not unusual levels of anxiety.  Lost her best friend 1 year ago and still grieving.  Not much family. Alone.  Not close to B and S who live elsewhere.  Talks to God daily.  Gets to work daily.  Function is good there.  Reads and calls people.  OCD no worse with Covid. Did join a Bible study which was a huge step. Plan start lamotrigine   04/04/20 appt with the following noted: Appt moved up.  She thought she was supposed to stop risperidone when started lamotrigine.  Started having SI off the risperidone but wasn't sure if it was SE.  No risperidone 3 mg for 3 weeks.Anxiety shaking sweating crying all started right away.  Also trouble with sleep and can't eat.  Plan:Restart risperidone 1 mg now and another milligram in 2 to 3 hours if not feeling sufficient relief to get the full dose of 3 mg today.  Then resume 3 mg risperidone each evening. Restart lamotrigine 25 mg daily for 2  weeks and follow the previous instructions on increasing it.  05/11/2020 appointment the following is noted: Marked improvement back on risperidone 3 mg.  SE shakes jaw.  Doesn't affect typing. Started lamotrigine and up to 100 mg for 2 weeks.  Feels it's helped depression. Better mood and able to laugh.  Able to get pizza for Bible study and not cry. Apptetite is not great.  Working on it. Anxiety is better not gone. Plan: no med changes  07/26/20 appt with the following noted; Increased lamotrigine gradually to 200 mg daily over the phone 6 weeks ago.  No improvement really for depression. With it. CC depression.   Risperidone still  helping anxiety. Plan: Increase lamotrigine to 300 mg daily..  09/14/2020 appointment with following noted: Started taper of lamotrigine DT NR. Started latuda 20 mg daily for 10 days.  Tolerated fine.  No effects so far.. Main problem is still depression.  Anxiety is not too bad.  No energy, motivation, sleeeping a lot.  No sig SI. In past started risperidone for anxiety and depression.  Increase Latuda to 40 mg daily. Disc potential DDI with risperidone reducing effectiveness of  Latuda so reduc risperidone to 1.5 mg HS  10/09/2020 phone call from patient: Patient left a voicemail on Friday while we were out of office for the Holiday that she is not any better. She feels the cutting in the 1/2 of Risperidone has really increased her anxiety. She was instructed to call back if not any better but concerned that taking away the Risperidone will really exacerbate her anxiety.  MD response:  At the last appointment we increased Latuda to 40 mg daily and reduced risperidone to half tablet daily.  The goal was to use Latuda and eliminate risperidone.  However since her anxiety has worsened with the reduction in risperidone, I agree with her and she should take the full dose of risperidone as well as continue the Latuda 40 mg daily.  The [name] is intended to help depression.  11/23/2020 appointment with the following noted: Propranolol only once daily. No better with higher Latuda.  No SE either. No change in appetite which is poor.  Still struggling with depression and OCD is a little worse with changes at work. Off lamotrigine. Plan: 5-HTP supplement 200 mg daily  Increase Latuda to 60 mg  for 3 weeks, If no benefit and no SE increase to 80 mg daily with food.  ADD still better with Concerta 72 mg versus Focalin. Continue high dosage paroxetine 80 mg daily.  01/16/2021 appointment with following noted: Increase Latuda to 80 mg without benefit or SE.  Anxiety and depression with poor STM.  Food  getting caught in her throat.  No change in eating.  No change in weight.   Anxious thoughts mostly about work even when not there.  Anxious about work needed on the house she can't afford.  Enjoys reading but feels guilty that she should be doing something else.  More general anxiety than OCD. 5-HTP supplement 200 mg daily Wean Latuda over a week DT NR Reduce mirtazapine to 30 mg HS  ADD still better with Concerta 72 mg versus Focalin. Start clomipramine 25 mg capsules 1 at night for 1 week, Then increase clomipramine to 2 capsules at night and reduce paroxetine to 1-1/2 tablets or 60 mg daily. Wait 1 week and then get the blood test   03/15/2021 appt noted: OK not great.  About the same.  Not much change in the last 8  weeks. SE none Lab 02/20/21 clomipramine 50 + Luvox 100 = clomip 195 + norclomip 111= total 306 Losing weight and no appetite and hard to eat. Weight loss to 107#. A lot of anxiety and depression   Sleep less sound with less mirtazapine.  Plan: Due to the severity of symptoms,Add olanzapine 5 mg tablet 1 at night Stop mirtazapine Reduce risperidone to 1/2 tablet for 1 week then stop it.  Call if SI recurs.  03/30/2021 appointment with the following noted: It makes me a little tired but so far anxiety is improved.  No problems off risperidone.  Appetite is not better but did get protein bars and is eating.   Less panic.  But still occurs.  Things just started this week to be a little better.  Not huge but it's a start.  A little more energy. Sleep 10 hours but that's unchanged with olanzapine. First time I've had hope in a long time. Plan: A little better with olanzapine 5 mg HS vs risperidone 3 mg. Increase olanzapine to 10 mg HS  Call in 2 weeks if you are not seeing progressive improvement  04/12/2021 appointment with the following noted: Better with anxiety.  Sleep better.  Daytime could nap if not busy.  Otherwise OK.  Not much change in appetite.  Up to 110# about 2.5#  better and trying to get protein twice daily.   A little change in OCD.   Depression is better some and staying up later and doing more than she was.  Doing some things after dinner which is huge.  Big change with depression.  Not retreating to bed as quickly. Plan: continue meds.  05/18/21 appt noted: Further improvement in anxiety.  I feel better but still not going out but not excessively sleeping.  Acting more normally, eating better.  Eating dinner again.  Sleep reduced to 8 hours. SE dry mouth and a little hungrier. Asks about going up on olanzapine to further reduce anxiety. [name] and describes better from 10/10 to 4/10 for anxiety and depression down to 3/10.Marland Kitchen  Anxiety symptoms include: Excessive Worry, Obsessive Compulsive Symptoms:   Handwashing,,.  Pt reports no sleep issues. Pt reports that appetite is good. Pt reports that energy is good and down slightly. Concentration is no change. Suicidal thoughts:  denied by patient. Plan increase olanzapine to 15 mg HS DT partial benefit  06/22/21 appt noted: Too tired and sleepy with 15 mg olanzapine and reduced the dose to 10 mg daily. No other med changes. Overall about the same.  Don't do anything different.  Happy that it's better than it was stays up to 930 until 6 PM.  Don't go out except Bible study and church once weekly. Goes to grocery early to avoid people. Anxiety lately worse than the depression. Dx pulsatile tinnitus Plan: cut olanzapine back to 10 mg HS Increase clomipramine to 75 mg HS  08/13/2021 appointment with the following noted: Anxiety is "good" after increase clomipramine.  Used to be desperate to get home and not now.  Was thinking of disability and now it's better.  I texted a guy on a dating site first time in 2 years.  It's a big deal.  Started exercising a little.   SE no worse with increase clomipramine. Depression is better too and manageable with brief downturns.    Sleep is pretty good.  Numerous failed psychiatric medication trials include Lexapro 60 mg a day, Zoloft 250, Paxil with  weight gain,  Fluvoxamine 100 Clomipramine 50,  Wellbutrin 300 with side effects,  mirtazapine Rexulti which helped initially,  Vraylar, Abilify between 1 and 2.5 mg and up to 15 mg with no response,  Latuda 80 failed Risperidone Olanzapine 15 sedation. Lamotrigine 300 NR buspirone 30 mg,  Cytomel,  lithium 625,  pramipexole with insomnia,   Lunesta with crying spells,  topiramate, Deplin, propranolol,   Ritalin, Focalin NR, Concerta 72 helped No history of Prozac, Viibryd.  Review of Systems:  Review of Systems  Constitutional:  Negative for appetite change, diaphoresis, fatigue and unexpected weight change.  HENT:  Positive for tinnitus.   Cardiovascular:  Negative for chest pain and palpitations.  Gastrointestinal:  Negative for abdominal pain.  Neurological:  Positive for tremors and headaches. Negative for dizziness, weakness and light-headedness.       Mouth  Psychiatric/Behavioral:  Negative for agitation, behavioral problems, confusion, decreased concentration, hallucinations, self-injury, sleep disturbance and suicidal ideas. The patient is not hyperactive.    Medications: I have reviewed the patient's current medications.  Current Outpatient Medications  Medication Sig Dispense Refill   ALPRAZolam (XANAX) 0.25 MG tablet Take 1 tablet (0.25 mg total) by mouth daily as needed for anxiety. 30 tablet 0   atorvastatin (LIPITOR) 20 MG tablet TAKE 1 TABLET BY MOUTH EVERY DAY 90 tablet 1   Calcium Carbonate-Vitamin D (OSCAL 500/200 D-3 PO) Take by mouth.     Cholecalciferol (VITAMIN D PO) Take by mouth.     clomiPRAMINE (ANAFRANIL) 25 MG capsule Take 3 capsules (75 mg total) by mouth at bedtime. 270 capsule 0   levothyroxine (SYNTHROID) 100 MCG tablet TAKE 1 TABLET BY MOUTH EVERY DAY 90 tablet 1   methylphenidate (CONCERTA) 36 MG PO CR tablet Take 2  tablets (72 mg total) by mouth daily. 60 tablet 0   [START ON 08/16/2021] methylphenidate 36 MG PO CR tablet Take 1 tablet (36 mg total) by mouth daily. 60 tablet 0   [START ON 09/15/2021] methylphenidate 36 MG PO CR tablet Take 1 tablet (36 mg total) by mouth daily. 60 tablet 0   OLANZapine (ZYPREXA) 10 MG tablet Take 1 tablet (10 mg total) by mouth at bedtime. 90 tablet 0   propranolol (INDERAL) 20 MG tablet TAKE 1 TO 2 TABLETS TWICE A DAY AS NEEDED FOR TREMOR 100 tablet 3   SUMAtriptan (IMITREX) 100 MG tablet Take 100 mg by mouth daily as needed for migraine.     topiramate (TOPAMAX) 100 MG tablet Take 1 tablet (100 mg total) by mouth 2 (two) times daily. 180 tablet 0   fluvoxaMINE (LUVOX) 100 MG tablet Take 0.5 tablets (50 mg total) by mouth 2 (two) times daily. 90 tablet 1   No current facility-administered medications for this visit.    Medication Side Effects:  Watch jaw tremor  Allergies:  Allergies  Allergen Reactions   Ampicillin Rash    Past Medical History:  Diagnosis Date   Gum lesion    Biopsy done-benign   Hypercholesteremia    Hypothyroid    LGSIL (low grade squamous intraepithelial dysplasia) 2010   Migraines    OCD (obsessive compulsive disorder)    Osteoporosis 01/2016   T score -2.7    Family History  Problem Relation Age of Onset   Hypertension Mother    Heart disease Mother    Hypertension Father    Breast cancer Paternal Aunt        Age 79   Diabetes Neg Hx  Social History   Socioeconomic History   Marital status: Single    Spouse name: Not on file   Number of children: Not on file   Years of education: Not on file   Highest education level: Not on file  Occupational History   Not on file  Tobacco Use   Smoking status: Never   Smokeless tobacco: Never  Vaping Use   Vaping Use: Never used  Substance and Sexual Activity   Alcohol use: Yes    Alcohol/week: 0.0 standard drinks    Comment: wine occassionally   Drug use: No   Sexual  activity: Not Currently    Birth control/protection: Post-menopausal    Comment: 1st intercourse 1 yo-5 partners  Other Topics Concern   Not on file  Social History Narrative   Not on file   Social Determinants of Health   Financial Resource Strain: Not on file  Food Insecurity: Not on file  Transportation Needs: Not on file  Physical Activity: Not on file  Stress: Not on file  Social Connections: Not on file  Intimate Partner Violence: Not on file    Past Medical History, Surgical history, Social history, and Family history were reviewed and updated as appropriate.   Please see review of systems for further details on the patient's review from today.   Objective:   Physical Exam:  BP 98/69   Pulse 79   LMP 11/19/2010   Physical Exam Constitutional:      General: She is not in acute distress.    Appearance: Normal appearance. She is well-developed.  Musculoskeletal:        General: No deformity.  Neurological:     Mental Status: She is alert and oriented to person, place, and time.     Motor: Tremor present.     Coordination: Coordination normal.     Comments: Mild mouth tremor  Psychiatric:        Attention and Perception: She is attentive. She does not perceive auditory hallucinations.        Mood and Affect: Mood is anxious and depressed. Affect is not labile, blunt, angry or inappropriate.        Speech: Speech is not delayed.        Behavior: Behavior is slowed.        Thought Content: Thought content is not paranoid. Thought content does not include homicidal or suicidal ideation. Thought content does not include homicidal or suicidal plan.        Cognition and Memory: Cognition normal.        Judgment: Judgment normal.     Comments: Insight intact. No auditory or visual hallucinations. No delusions. Less checking. Severe sx further better with both anxiety and depression.  Clomiprmaine helped.  Less slowed.    Lab Review:     Component Value Date/Time    NA 139 11/14/2020 0804   K 4.0 11/14/2020 0804   CL 104 11/14/2020 0804   CO2 27 11/14/2020 0804   GLUCOSE 96 11/14/2020 0804   BUN 15 11/14/2020 0804   CREATININE 0.82 11/14/2020 0804   CALCIUM 9.8 11/14/2020 0804   PROT 6.9 11/14/2020 0804   ALBUMIN 4.6 11/14/2020 0804   AST 19 11/14/2020 0804   ALT 19 11/14/2020 0804   ALKPHOS 41 11/14/2020 0804   BILITOT 0.4 11/14/2020 0804       Component Value Date/Time   WBC 5.9 01/29/2016 0834   RBC 4.85 01/29/2016 0834   HGB 15.2 (H) 01/29/2016 6440  HCT 46.2 (H) 01/29/2016 0834   PLT 232.0 01/29/2016 0834   MCV 95.3 01/29/2016 0834   MCHC 33.0 01/29/2016 0834   RDW 14.7 01/29/2016 0834   LYMPHSABS 1.3 01/29/2016 0834   MONOABS 0.4 01/29/2016 0834   EOSABS 0.3 01/29/2016 0834   BASOSABS 0.0 01/29/2016 0834    No results found for: POCLITH, LITHIUM   No results found for: PHENYTOIN, PHENOBARB, VALPROATE, CBMZ   .res Assessment: Plan:    Paola was seen today for follow-up, depression and anxiety.  Diagnoses and all orders for this visit:  Major depressive disorder, recurrent severe without psychotic features (HCC) -     fluvoxaMINE (LUVOX) 100 MG tablet; Take 0.5 tablets (50 mg total) by mouth 2 (two) times daily.  Social anxiety disorder  Mixed obsessional thoughts and acts -     fluvoxaMINE (LUVOX) 100 MG tablet; Take 0.5 tablets (50 mg total) by mouth 2 (two) times daily.  Attention deficit hyperactivity disorder (ADHD), predominantly inattentive type  Tremor of face and hands  We discussed TRD and anxiety . she has a long list of failed psychiatric meds.  She has not tended to do well with higher dosages of SSRIs.  She is afraid of doing TMS.  She knows her fears are exaggerated and irrational. Patient has been markedly depressed and anxious.   Since adding olanzapine 10 mg she has seen a significant improvement in depression and anxiety. Much better with clomipramine 75 HS  Disc difference between  depression and situational sadness.  Chart review revealed that prior to starting treatment here patient had taken clomipramine in the past with some benefit but also with some side effects.  It is a tricyclic antidepressant distinctly different from SSRIs.  Some patients respond better to them.  She has a history of previous benefit.  It makes sense to retry that medication.  We discussed the pros and cons of combining it with Luvox but just not appropriate while she is on paroxetine.  We discussed the value of doing blood levels with tricyclic antidepressants like clomipramine.  She agrees.  Lab 02/20/21 clomipramine 50 + Luvox 100 = clomip 195 + norclomip 111= total 306 (normal 220-500) Better with Increase clomipramine to 75 mg HS Consider increasing fluvoxamine.  5-HTP supplement 200 mg daily  ADD still better with Concerta 72 mg versus Focalin. . . She is continuing psychotherapy with Dr. Farrel Demark  Discussed potential metabolic side effects associated with atypical antipsychotics, as well as potential risk for movement side effects. Advised pt to contact office if movement side effects occur.  She agrees with the plan  Disc dx pulsatile tinnitus and possible treatment of gabapentin.  Getting worked up for it now.  Mouth tremor appears unchanged since the switch from risperidone to olanzapine.  We will continue to monitor.  TMS and Spravato should be considered options.  Follow-up in 4-5 weeks DT severity of sx.  Meredith Staggers MD, DFAPA  Future Appointments  Date Time Provider Department Center  08/22/2021  1:00 PM [facility] 1 [facility]  09/11/2021  4:00 PM Robley Fries, PhD [facility] None  01/01/2022  8:15 AM [facility] None  01/03/2022  8:15 AM Reather Littler, MD [facility] None  02/20/2022  3:30 PM Wyline Beady A, NP [facility] None    No orders of the defined types were placed in this encounter.     -------------------------------

## 2021-08-15 ENCOUNTER — Other Ambulatory Visit: Payer: BC Managed Care – PPO

## 2021-08-17 ENCOUNTER — Other Ambulatory Visit: Payer: Self-pay | Admitting: Psychiatry

## 2021-08-17 DIAGNOSIS — F422 Mixed obsessional thoughts and acts: Secondary | ICD-10-CM

## 2021-08-17 DIAGNOSIS — F332 Major depressive disorder, recurrent severe without psychotic features: Secondary | ICD-10-CM

## 2021-08-20 ENCOUNTER — Telehealth: Payer: Self-pay | Admitting: Psychiatry

## 2021-08-20 ENCOUNTER — Other Ambulatory Visit: Payer: Self-pay

## 2021-08-20 DIAGNOSIS — F900 Attention-deficit hyperactivity disorder, predominantly inattentive type: Secondary | ICD-10-CM

## 2021-08-20 MED ORDER — METHYLPHENIDATE HCL ER 36 MG PO TB24: 36.0000 mg | ORAL_TABLET | Freq: Two times a day (BID) | ORAL | 0 refills | Status: DC

## 2021-08-20 NOTE — Telephone Encounter (Signed)
[facility] called about concerta generic Rx. Was 2/d. This Rx shows 1/d. Please verify. [phone].

## 2021-08-20 NOTE — Telephone Encounter (Signed)
Spoke to pharmacy and cancelled rx.Pended

## 2021-08-22 ENCOUNTER — Other Ambulatory Visit: Payer: Self-pay

## 2021-08-22 ENCOUNTER — Ambulatory Visit
Admission: RE | Admit: 2021-08-22 | Discharge: 2021-08-22 | Disposition: A | Discharge status: Home / Self Care | Payer: BC Managed Care – PPO | Source: Ambulatory Visit | Attending: Otolaryngology | Admitting: Otolaryngology

## 2021-08-22 DIAGNOSIS — H93A2 Pulsatile tinnitus, left ear: Secondary | ICD-10-CM

## 2021-08-22 DIAGNOSIS — I669 Occlusion and stenosis of unspecified cerebral artery: Secondary | ICD-10-CM | POA: Diagnosis not present

## 2021-08-22 DIAGNOSIS — I6509 Occlusion and stenosis of unspecified vertebral artery: Secondary | ICD-10-CM | POA: Diagnosis not present

## 2021-08-22 MED ORDER — IOPAMIDOL (ISOVUE-370) INJECTION 76%
75.0000 mL | Freq: Once | INTRAVENOUS | Status: AC | PRN
  Administered 2021-08-22: 75 mL via INTRAVENOUS

## 2021-08-31 ENCOUNTER — Other Ambulatory Visit: Payer: Self-pay | Admitting: *Deleted

## 2021-08-31 ENCOUNTER — Other Ambulatory Visit: Payer: Self-pay | Admitting: Otolaryngology

## 2021-09-04 ENCOUNTER — Other Ambulatory Visit (HOSPITAL_COMMUNITY): Payer: Self-pay | Admitting: Neuroradiology

## 2021-09-04 ENCOUNTER — Other Ambulatory Visit (HOSPITAL_COMMUNITY): Payer: Self-pay | Admitting: Otolaryngology

## 2021-09-04 DIAGNOSIS — H93A2 Pulsatile tinnitus, left ear: Secondary | ICD-10-CM

## 2021-09-04 DIAGNOSIS — Q2730 Arteriovenous malformation, site unspecified: Secondary | ICD-10-CM

## 2021-09-05 ENCOUNTER — Telehealth (HOSPITAL_COMMUNITY): Payer: Self-pay

## 2021-09-05 NOTE — Telephone Encounter (Signed)
Called to schedule diagnostic angiogram with Dr. Quay Burow, no answer, left vm. AW

## 2021-09-10 ENCOUNTER — Other Ambulatory Visit: Payer: Self-pay | Admitting: Psychiatry

## 2021-09-11 ENCOUNTER — Other Ambulatory Visit: Payer: Self-pay

## 2021-09-11 ENCOUNTER — Ambulatory Visit (INDEPENDENT_AMBULATORY_CARE_PROVIDER_SITE_OTHER): Payer: BC Managed Care – PPO | Admitting: Psychiatry

## 2021-09-11 DIAGNOSIS — F331 Major depressive disorder, recurrent, moderate: Secondary | ICD-10-CM

## 2021-09-11 DIAGNOSIS — F422 Mixed obsessional thoughts and acts: Secondary | ICD-10-CM

## 2021-09-11 DIAGNOSIS — Z634 Disappearance and death of family member: Secondary | ICD-10-CM

## 2021-09-11 DIAGNOSIS — F4010 Social phobia, unspecified: Secondary | ICD-10-CM | POA: Diagnosis not present

## 2021-09-11 NOTE — Progress Notes (Signed)
Psychotherapy Progress Note [facility] [name], PhD LP  Patient ID: COLLYNS MCQUIGG)    MRN: [id number] Therapy format: Individual psychotherapy Date: 09/11/2021      Start: 4:03p     Stop: 4:53p     Time Spent: 50 min Location: In-person   Session narrative (presenting needs, interim history, self-report of stressors and symptoms, applications of prior therapy, status changes, and interventions made in session) Per pattern (to save cash flow), last seen 6 weeks ago.  Med check 3 weeks ago reports better anxiety with increased clomipramine, relieved ruminating about disability, broke the ice and communicated on dating site first time in 2 years, sleeping well, resumed a little exercise.  Clear today she's not so desperate to get home.  Est she peaks at 5-6/10 comfort, average 4/10 in her work environment.  Church is pretty comfortable, est 7 when with [name].  Overall impression that experience of desperateness has shrunk.  Still antsy to go home, watch the clock.  At home can watch TV, relax some, have some time for herself, and -- importantly -- to live in normal circumstances without the incontinent cat.  Still grieves the cat she put down.  MRI came out fine.  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  Socially, confirms she broke ice on the dating site, has spoken to two guys, braving great anxiety.  Still absolutely phobic of meeting new people, men or otherwise.  Regards herself stuck, still, at home for all of her free time.  Would see herself joining a book club if she could find one suitable.  Gingerly explored asking [employer] or [employer] for further word on the book clubs that they have and agreed she could make the call, ask a couple more questions.  Still shops Saturday early in the morning to avoid crowds, but has started noticing the other people around, in a way she hadn't been.     Therapeutic modalities: Cognitive Behavioral Therapy, Solution-Oriented/Positive Psychology, Environmental manager, and Motivational Interviewing  Mental Status/Observations:  Appearance:   Neat     Behavior:  Appropriate  Motor:  Normal and small tremor  Speech/Language:   Clear and Coherent  Affect:  Appropriate and responsive  Mood:  anxious and considerably less depressed, sense of humor back  Thought process:  normal  Thought content:    WNL  Sensory/Perceptual disturbances:    WNL  Orientation:  Fully oriented  Attention:  Good    Concentration:  Good  Memory:  WNL  Insight:    Good  Judgment:   Good  Impulse Control:  Good   Risk Assessment: Danger to Self: No Self-injurious Behavior: No Danger to Others: No Physical Aggression / Violence: No Duty to Warn: No Access to Firearms a concern: No  Assessment of progress:  progressing well  Diagnosis:   ICD-10-CM   1. Social anxiety disorder  F40.10     2. Mixed obsessional thoughts and acts  F42.2     3. Major depressive disorder, recurrent episode, moderate (HCC)  F33.1     4. Bereavement  Z63.4      Plan:  Apply "good reasons" perspective to choices how to spend her time -- not everything good has to be about dutiful sacrifice  Option to inquire with [employer] and parks & rec about book clubs and walking groups Social exposure ad lib, including online dating, church groups, and exploring available  book clubs, explore shopping during times with more people Self-affirm whooshing symptom is identifiable and treatable Self-affirm mistakes with accounting not the end of the world.  Ongoing freedom to make intentional pseudomistakes as practice seeing the difference to where she would not doubt her memory or perception For fear of mistakes, still look for opportunities to make temporary, intentional mistakes, and recruit boss if needed  Other recommendations/advice as may be noted above Continue to utilize previously  learned skills ad lib Maintain medication as prescribed and work faithfully with relevant prescriber(s) if any changes are desired or seem indicated Call the clinic on-call service, 988/hotline, present to ER, or call 911 if any life-threatening psychiatric crisis Return for time at discretion. Already scheduled visit in this office 10/30/2021.  Robley Fries, PhD [name], PhD LP Clinical Psychologist, [facility] [address] [place], [place] [address] [phone]

## 2021-09-12 ENCOUNTER — Other Ambulatory Visit: Payer: Self-pay | Admitting: Psychiatry

## 2021-09-12 DIAGNOSIS — F332 Major depressive disorder, recurrent severe without psychotic features: Secondary | ICD-10-CM

## 2021-09-12 DIAGNOSIS — E638 Other specified nutritional deficiencies: Secondary | ICD-10-CM

## 2021-09-12 DIAGNOSIS — F4010 Social phobia, unspecified: Secondary | ICD-10-CM

## 2021-09-12 DIAGNOSIS — F422 Mixed obsessional thoughts and acts: Secondary | ICD-10-CM

## 2021-09-17 DIAGNOSIS — L989 Disorder of the skin and subcutaneous tissue, unspecified: Secondary | ICD-10-CM | POA: Diagnosis not present

## 2021-09-17 DIAGNOSIS — Z23 Encounter for immunization: Secondary | ICD-10-CM | POA: Diagnosis not present

## 2021-09-28 ENCOUNTER — Other Ambulatory Visit: Payer: Self-pay | Admitting: Endocrinology

## 2021-09-28 DIAGNOSIS — E063 Autoimmune thyroiditis: Secondary | ICD-10-CM

## 2021-10-01 DIAGNOSIS — H9312 Tinnitus, left ear: Secondary | ICD-10-CM | POA: Diagnosis not present

## 2021-10-10 DIAGNOSIS — C44311 Basal cell carcinoma of skin of nose: Secondary | ICD-10-CM | POA: Diagnosis not present

## 2021-10-10 DIAGNOSIS — D485 Neoplasm of uncertain behavior of skin: Secondary | ICD-10-CM | POA: Diagnosis not present

## 2021-10-18 ENCOUNTER — Other Ambulatory Visit: Payer: Self-pay

## 2021-10-18 ENCOUNTER — Telehealth: Payer: Self-pay | Admitting: Psychiatry

## 2021-10-18 MED ORDER — METHYLPHENIDATE HCL ER 36 MG PO TB24: 36.0000 mg | ORAL_TABLET | Freq: Two times a day (BID) | ORAL | 0 refills | Status: DC

## 2021-10-18 NOTE — Telephone Encounter (Signed)
Pt called and needs a refill on her methlphenidate 36 mg to the [facility] located [address]. They do have it in stock

## 2021-10-18 NOTE — Telephone Encounter (Signed)
Pended.

## 2021-10-22 DIAGNOSIS — R519 Headache, unspecified: Secondary | ICD-10-CM | POA: Diagnosis not present

## 2021-10-22 DIAGNOSIS — I671 Cerebral aneurysm, nonruptured: Secondary | ICD-10-CM | POA: Diagnosis not present

## 2021-10-22 DIAGNOSIS — G43719 Chronic migraine without aura, intractable, without status migrainosus: Secondary | ICD-10-CM | POA: Diagnosis not present

## 2021-10-22 DIAGNOSIS — G43019 Migraine without aura, intractable, without status migrainosus: Secondary | ICD-10-CM | POA: Diagnosis not present

## 2021-10-29 ENCOUNTER — Other Ambulatory Visit: Payer: Self-pay | Admitting: Neurosurgery

## 2021-10-29 ENCOUNTER — Ambulatory Visit: Payer: BC Managed Care – PPO | Admitting: Psychiatry

## 2021-10-29 DIAGNOSIS — I671 Cerebral aneurysm, nonruptured: Secondary | ICD-10-CM

## 2021-10-30 ENCOUNTER — Ambulatory Visit: Payer: BC Managed Care – PPO | Admitting: Psychiatry

## 2021-10-30 ENCOUNTER — Other Ambulatory Visit: Payer: Self-pay

## 2021-10-30 ENCOUNTER — Encounter: Payer: Self-pay | Admitting: Psychiatry

## 2021-10-30 VITALS — BP 99/62 | HR 81

## 2021-10-30 DIAGNOSIS — F4010 Social phobia, unspecified: Secondary | ICD-10-CM

## 2021-10-30 DIAGNOSIS — F422 Mixed obsessional thoughts and acts: Secondary | ICD-10-CM | POA: Diagnosis not present

## 2021-10-30 DIAGNOSIS — F330 Major depressive disorder, recurrent, mild: Secondary | ICD-10-CM

## 2021-10-30 DIAGNOSIS — R251 Tremor, unspecified: Secondary | ICD-10-CM

## 2021-10-30 DIAGNOSIS — F900 Attention-deficit hyperactivity disorder, predominantly inattentive type: Secondary | ICD-10-CM

## 2021-10-30 MED ORDER — METHYLPHENIDATE HCL ER 36 MG PO TB24: 36.0000 mg | ORAL_TABLET | Freq: Two times a day (BID) | ORAL | 0 refills | Status: DC

## 2021-10-30 MED ORDER — METHYLPHENIDATE HCL ER (OSM) 36 MG PO TBCR: 72.0000 mg | EXTENDED_RELEASE_TABLET | Freq: Every day | ORAL | 0 refills | Status: DC

## 2021-10-30 NOTE — Progress Notes (Signed)
Ashlee Chavez [id number] 1961/07/07 60 y.o.  Subjective:   Patient ID:  Ashlee Chavez is a 60 y.o. (DOB [birth year]) female.  Chief Complaint:  Chief Complaint  Patient presents with   Follow-up   Depression   Anxiety   ADD    Depression        Associated symptoms include headaches.  Associated symptoms include no decreased concentration, no fatigue, no appetite change and no suicidal ideas. Ashlee Chavez presents today for follow-up of recently worse depression DT relationship loss.    When seen February 10, 2019.  She had received some benefit from Ritalin 20 mg 3 times daily but was having issues with the crash.  We switched to Concerta 72 mg every morning.  September 14, 2019.  She was having persistent bothersome OCD and was interested in further medicine changes to try to help with these symptoms because she has aggressively pursued therapy and still has significant symptoms.  She has been on multiple SSRIs and so we initiated augmentation with risperidone half milligram daily to increase to 1 mg daily. Sig improvement with risperidone.  SE low appetite with it.  Not hungry for dinner. Sleep so much better also.  Improvement is "marked".  Think better bc less obsessing time.  Not constant now.  Noticed benefit within the first week.  Less obsessing lessened depression.  But residual symptoms.  visit October 14, 2019.  She was encouraged to experiment with the dosing of the risperidone up to 2 mg or 3 mg nightly to see if she could get additional reduction in her chronic anxiety and OCD.   seen December 15, 2019.  Settled at 2 mg risperidone bc increased sleep.  Sleep is so much better.  Harder to get up in the morning.   Found out insurance won't cover Concerta anymore.  Not as good on Ritalin TID.  Not as even a response.  Terrible insurance. Therefore the only med change with switching to Focalin to see if it would be covered better.  Appt 4/82/50, switch to Concerta 72 mg is  better with better mood and motivation vs Focalin.  Focus is also better.  Very poor function with Focalin and better with Concerta.   Better overall with more even response to stimulant and better appetite.  No SE.  Duration all day.  Less desperate hopeless feelings on the stimulant.  Stressful times. With increase risperidone a little better mood bump.  SE a little shakiness noticed in jaw.   Not unusual levels of anxiety.  Lost her best friend 1 year ago and still grieving.  Not much family. Alone.  Not close to B and S who live elsewhere.  Talks to God daily.  Gets to work daily.  Function is good there.  Reads and calls people.  OCD no worse with Covid. Did join a Bible study which was a huge step. Plan start lamotrigine   04/04/20 appt with the following noted: Appt moved up.  She thought she was supposed to stop risperidone when started lamotrigine.  Started having SI off the risperidone but wasn't sure if it was SE.  No risperidone 3 mg for 3 weeks.Anxiety shaking sweating crying all started right away.  Also trouble with sleep and can't eat.  Plan:Restart risperidone 1 mg now and another milligram in 2 to 3 hours if not feeling sufficient relief to get the full dose of 3 mg today.  Then resume 3 mg risperidone each evening. Restart lamotrigine 25 mg  daily for 2 weeks and follow the previous instructions on increasing it.  05/11/2020 appointment the following is noted: Marked improvement back on risperidone 3 mg.  SE shakes jaw.  Doesn't affect typing. Started lamotrigine and up to 100 mg for 2 weeks.  Feels it's helped depression. Better mood and able to laugh.  Able to get pizza for Bible study and not cry. Apptetite is not great.  Working on it. Anxiety is better not gone. Plan: no med changes  07/26/20 appt with the following noted; Increased lamotrigine gradually to 200 mg daily over the phone 6 weeks ago.  No improvement really for depression. With it. CC depression.   Risperidone  still helping anxiety. Plan: Increase lamotrigine to 300 mg daily..  09/14/2020 appointment with following noted: Started taper of lamotrigine DT NR. Started latuda 20 mg daily for 10 days.  Tolerated fine.  No effects so far.. Main problem is still depression.  Anxiety is not too bad.  No energy, motivation, sleeeping a lot.  No sig SI. In past started risperidone for anxiety and depression.  Increase Latuda to 40 mg daily. Disc potential DDI with risperidone reducing effectiveness of  Latuda so reduc risperidone to 1.5 mg HS  10/09/2020 phone call from patient: Patient left a voicemail on Friday while we were out of office for the Holiday that she is not any better. She feels the cutting in the 1/2 of Risperidone has really increased her anxiety. She was instructed to call back if not any better but concerned that taking away the Risperidone will really exacerbate her anxiety.  MD response:  At the last appointment we increased Latuda to 40 mg daily and reduced risperidone to half tablet daily.  The goal was to use Latuda and eliminate risperidone.  However since her anxiety has worsened with the reduction in risperidone, I agree with her and she should take the full dose of risperidone as well as continue the Latuda 40 mg daily.  The Ashlee Chavez is intended to help depression.  11/23/2020 appointment with the following noted: Propranolol only once daily. No better with higher Latuda.  No SE either. No change in appetite which is poor.  Still struggling with depression and OCD is a little worse with changes at work. Off lamotrigine. Plan: 5-HTP supplement 200 mg daily  Increase Latuda to 60 mg  for 3 weeks, If no benefit and no SE increase to 80 mg daily with food.  ADD still better with Concerta 72 mg versus Focalin. Continue high dosage paroxetine 80 mg daily.  01/16/2021 appointment with following noted: Increase Latuda to 80 mg without benefit or SE.  Anxiety and depression with poor STM.   Food getting caught in her throat.  No change in eating.  No change in weight.   Anxious thoughts mostly about work even when not there.  Anxious about work needed on the house she can't afford.  Enjoys reading but feels guilty that she should be doing something else.  More general anxiety than OCD. 5-HTP supplement 200 mg daily Wean Latuda over a week DT NR Reduce mirtazapine to 30 mg HS  ADD still better with Concerta 72 mg versus Focalin. Start clomipramine 25 mg capsules 1 at night for 1 week, Then increase clomipramine to 2 capsules at night and reduce paroxetine to 1-1/2 tablets or 60 mg daily. Wait 1 week and then get the blood test   03/15/2021 appt noted: OK not great.  About the same.  Not much change in  the last 8 weeks. SE none Lab 02/20/21 clomipramine 50 + Luvox 100 = clomip 195 + norclomip 111= total 306 Losing weight and no appetite and hard to eat. Weight loss to 107#. A lot of anxiety and depression   Sleep less sound with less mirtazapine.  Plan: Due to the severity of symptoms,Add olanzapine 5 mg tablet 1 at night Stop mirtazapine Reduce risperidone to 1/2 tablet for 1 week then stop it.  Call if SI recurs.  03/30/2021 appointment with the following noted: It makes me a little tired but so far anxiety is improved.  No problems off risperidone.  Appetite is not better but did get protein bars and is eating.   Less panic.  But still occurs.  Things just started this week to be a little better.  Not huge but it's a start.  A little more energy. Sleep 10 hours but that's unchanged with olanzapine. First time I've had hope in a long time. Plan: A little better with olanzapine 5 mg HS vs risperidone 3 mg. Increase olanzapine to 10 mg HS  Call in 2 weeks if you are not seeing progressive improvement  04/12/2021 appointment with the following noted: Better with anxiety.  Sleep better.  Daytime could nap if not busy.  Otherwise OK.  Not much change in appetite.  Up to 110# about  2.5# better and trying to get protein twice daily.   A little change in OCD.   Depression is better some and staying up later and doing more than she was.  Doing some things after dinner which is huge.  Big change with depression.  Not retreating to bed as quickly. Plan: continue meds.  05/18/21 appt noted: Further improvement in anxiety.  I feel better but still not going out but not excessively sleeping.  Acting more normally, eating better.  Eating dinner again.  Sleep reduced to 8 hours. SE dry mouth and a little hungrier. Asks about going up on olanzapine to further reduce anxiety. [name] and describes better from 10/10 to 4/10 for anxiety and depression down to 3/10.Marland Kitchen  Anxiety symptoms include: Excessive Worry, Obsessive Compulsive Symptoms:   Handwashing,,.  Pt reports no sleep issues. Pt reports that appetite is good. Pt reports that energy is good and down slightly. Concentration is no change. Suicidal thoughts:  denied by patient. Plan increase olanzapine to 15 mg HS DT partial benefit  06/22/21 appt noted: Too tired and sleepy with 15 mg olanzapine and reduced the dose to 10 mg daily. No other med changes. Overall about the same.  Don't do anything different.  Happy that it's better than it was stays up to 930 until 6 PM.  Don't go out except Bible study and church once weekly. Goes to grocery early to avoid people. Anxiety lately worse than the depression. Dx pulsatile tinnitus Plan: cut olanzapine back to 10 mg HS Increase clomipramine to 75 mg HS  08/13/2021 appointment with the following noted: Anxiety is "good" after increase clomipramine.  Used to be desperate to get home and not now.  Was thinking of disability and now it's better.  I texted a guy on a dating site first time in 2 years.  It's a big deal.  Started exercising a little.   SE no worse with increase clomipramine. Depression is better too and manageable with brief downturns.    Sleep is pretty good. Plan no med changes  10/30/2021 appt noted: Pretty good and a little  better than last time.  Still getting out a little with a guy she met on dating site and it's going ok.  Still prefers to be home.  Still get anxious going out. Overall best response so far with any med for anxiety.   Depression is manageable. SE is OK Staying up more appropriately late and sleep is OK.   Numerous failed psychiatric medication trials include Lexapro 60 mg a day, Zoloft 250, Paxil with weight gain,  Fluvoxamine 100 Clomipramine 50,  Wellbutrin 300 with side effects,  mirtazapine Rexulti which helped initially,  Vraylar, Abilify between 1 and 2.5 mg and up to 15 mg with no response,  Latuda 80 failed Risperidone Olanzapine 15 sedation. Lamotrigine 300 NR buspirone 30 mg,  Cytomel,  lithium 625,  pramipexole with insomnia,   Lunesta with crying spells,  topiramate, Deplin, propranolol,   Ritalin, Focalin NR, Concerta 72 helped No history of Prozac, Viibryd.  Review of Systems:  Review of Systems  Constitutional:  Negative for appetite change, diaphoresis, fatigue and unexpected weight change.  HENT:  Positive for tinnitus.   Cardiovascular:  Negative for palpitations.  Gastrointestinal:  Negative for abdominal pain.  Neurological:  Positive for tremors and headaches. Negative for dizziness, weakness and light-headedness.       Mouth  Psychiatric/Behavioral:  Negative for agitation, behavioral problems, confusion, decreased concentration, hallucinations, self-injury, sleep disturbance and suicidal ideas. The patient is not hyperactive.    Medications: I have reviewed the patient's current medications.  Current Outpatient Medications  Medication Sig Dispense Refill   ALPRAZolam (XANAX) 0.25 MG tablet Take 1 tablet (0.25 mg total) by mouth daily as needed for anxiety. 30 tablet 0   atorvastatin (LIPITOR) 20 MG tablet TAKE 1 TABLET BY MOUTH EVERY DAY 90 tablet 1    Calcium Carbonate-Vitamin D (OSCAL 500/200 D-3 PO) Take by mouth.     Cholecalciferol (VITAMIN D PO) Take by mouth.     clomiPRAMINE (ANAFRANIL) 25 MG capsule Take 3 capsules (75 mg total) by mouth at bedtime. 270 capsule 0   fluvoxaMINE (LUVOX) 100 MG tablet Take 0.5 tablets (50 mg total) by mouth 2 (two) times daily. 90 tablet 1   levothyroxine (SYNTHROID) 100 MCG tablet TAKE 1 TABLET BY MOUTH EVERY DAY 90 tablet 1   OLANZapine (ZYPREXA) 10 MG tablet TAKE 1 TABLET BY MOUTH EVERYDAY AT BEDTIME 60 tablet 0   propranolol (INDERAL) 20 MG tablet TAKE 1 TO 2 TABLETS TWICE A DAY AS NEEDED FOR TREMOR 100 tablet 3   SUMAtriptan (IMITREX) 100 MG tablet Take 100 mg by mouth daily as needed for migraine.     topiramate (TOPAMAX) 100 MG tablet TAKE 1 TABLET BY MOUTH TWICE A DAY 180 tablet 0   [START ON 12/25/2021] methylphenidate (CONCERTA) 36 MG PO CR tablet Take 2 tablets (72 mg total) by mouth daily. 60 tablet 0   [START ON 11/27/2021] methylphenidate 36 MG PO CR tablet Take 1 tablet (36 mg total) by mouth 2 (two) times daily. 60 tablet 0   methylphenidate 36 MG PO CR tablet Take 1 tablet (36 mg total) by mouth 2 (two) times daily. 60 tablet 0   No current facility-administered medications for this visit.    Medication Side Effects:  Watch jaw tremor  Allergies:  Allergies  Allergen Reactions   Ampicillin Rash    Past Medical History:  Diagnosis Date   Gum lesion    Biopsy done-benign   Hypercholesteremia    Hypothyroid    LGSIL (low grade  squamous intraepithelial dysplasia) 2010   Migraines    OCD (obsessive compulsive disorder)    Osteoporosis 01/2016   T score -2.7    Family History  Problem Relation Age of Onset   Hypertension Mother    Heart disease Mother    Hypertension Father    Breast cancer Paternal Aunt        Age 39   Diabetes Neg Hx     Social History   Socioeconomic History   Marital status: Single    Spouse name: Not on file   Number of children: Not on file    Years of education: Not on file   Highest education level: Not on file  Occupational History   Not on file  Tobacco Use   Smoking status: Never   Smokeless tobacco: Never  Vaping Use   Vaping Use: Never used  Substance and Sexual Activity   Alcohol use: Yes    Alcohol/week: 0.0 standard drinks    Comment: wine occassionally   Drug use: No   Sexual activity: Not Currently    Birth control/protection: Post-menopausal    Comment: 1st intercourse 52 yo-5 partners  Other Topics Concern   Not on file  Social History Narrative   Not on file   Social Determinants of Health   Financial Resource Strain: Not on file  Food Insecurity: Not on file  Transportation Needs: Not on file  Physical Activity: Not on file  Stress: Not on file  Social Connections: Not on file  Intimate Partner Violence: Not on file    Past Medical History, Surgical history, Social history, and Family history were reviewed and updated as appropriate.   Please see review of systems for further details on the patient's review from today.   Objective:   Physical Exam:  BP 99/62    Pulse 81    LMP 11/19/2010   Physical Exam Constitutional:      General: She is not in acute distress.    Appearance: Normal appearance. She is well-developed.  Musculoskeletal:        General: No deformity.  Neurological:     Mental Status: She is alert and oriented to person, place, and time.     Motor: Tremor present.     Coordination: Coordination normal.     Comments: Mild mouth tremor  Psychiatric:        Attention and Perception: She is attentive. She does not perceive auditory hallucinations.        Mood and Affect: Mood is anxious. Mood is not depressed. Affect is not labile, blunt, angry or inappropriate.        Speech: Speech is not delayed.        Behavior: Behavior is not slowed.        Thought Content: Thought content is not paranoid. Thought content does not include homicidal or suicidal ideation. Thought  content does not include suicidal plan.        Cognition and Memory: Cognition normal.        Judgment: Judgment normal.     Comments: Insight intact. No auditory or visual hallucinations. No delusions. Less checking. Severe sx further better with both anxiety and depression.  Clomiprmaine helped.  Less slowed.    Lab Review:     Component Value Date/Time   NA 139 11/14/2020 0804   K 4.0 11/14/2020 0804   CL 104 11/14/2020 0804   CO2 27 11/14/2020 0804   GLUCOSE 96 11/14/2020 0804   BUN 15 11/14/2020  0804   CREATININE 0.82 11/14/2020 0804   CALCIUM 9.8 11/14/2020 0804   PROT 6.9 11/14/2020 0804   ALBUMIN 4.6 11/14/2020 0804   AST 19 11/14/2020 0804   ALT 19 11/14/2020 0804   ALKPHOS 41 11/14/2020 0804   BILITOT 0.4 11/14/2020 0804       Component Value Date/Time   WBC 5.9 01/29/2016 0834   RBC 4.85 01/29/2016 0834   HGB 15.2 (H) 01/29/2016 0834   HCT 46.2 (H) 01/29/2016 0834   PLT 232.0 01/29/2016 0834   MCV 95.3 01/29/2016 0834   MCHC 33.0 01/29/2016 0834   RDW 14.7 01/29/2016 0834   LYMPHSABS 1.3 01/29/2016 0834   MONOABS 0.4 01/29/2016 0834   EOSABS 0.3 01/29/2016 0834   BASOSABS 0.0 01/29/2016 0834    No results found for: POCLITH, LITHIUM   No results found for: PHENYTOIN, PHENOBARB, VALPROATE, CBMZ   .res Assessment: Plan:    Lalisa was seen today for follow-up, depression, anxiety and add.  Diagnoses and all orders for this visit:  Social anxiety disorder  Mixed obsessional thoughts and acts  Mild recurrent major depression (HCC)  Attention deficit hyperactivity disorder (ADHD), predominantly inattentive type -     methylphenidate (CONCERTA) 36 MG PO CR tablet; Take 2 tablets (72 mg total) by mouth daily. -     methylphenidate 36 MG PO CR tablet; Take 1 tablet (36 mg total) by mouth 2 (two) times daily. -     methylphenidate 36 MG PO CR tablet; Take 1 tablet (36 mg total) by mouth 2 (two) times daily.  Tremor of face and hands   We discussed  TRD and anxiety . she has a long list of failed psychiatric meds.  She has not tended to do well with higher dosages of SSRIs.  She is afraid of doing [place].  She knows her fears are exaggerated and irrational. Patient has been markedly depressed and anxious.   Since adding olanzapine 10 mg she has seen a significant improvement in depression and anxiety. Much better with clomipramine 75 HS Also continue olanzapine 10 HS  Disc difference between depression and situational sadness.  Chart review revealed that prior to starting treatment here patient had taken clomipramine in the past with some benefit but also with some side effects.  It is a tricyclic antidepressant distinctly different from SSRIs.  Some patients respond better to them.  She has a history of previous benefit.  It makes sense to retry that medication.  We discussed the pros and cons of combining it with Luvox but just not appropriate while she is on paroxetine.  We discussed the value of doing blood levels with tricyclic antidepressants like clomipramine.  She agrees.  Lab 02/20/21 clomipramine 50 + Luvox 100 = clomip 195 + norclomip 111= total 306 (normal 220-500) Better with Increase clomipramine to 75 mg HS Consider increasing fluvoxamine.  5-HTP supplement 200 mg daily  ADD still better with Concerta 72 mg versus Focalin. . . She is continuing psychotherapy with Dr. Rica Mote  Discussed potential metabolic side effects associated with atypical antipsychotics, as well as potential risk for movement side effects. Advised pt to contact office if movement side effects occur.  She agrees with the plan  Disc dx pulsatile tinnitus and possible treatment of gabapentin.  Getting worked up for it now.  Mouth tremor appears unchanged since the switch from risperidone to olanzapine.  We will continue to monitor.  TMS and Spravato should be considered options.  Follow-up in 3 mos  Lynder Parents MD, DFAPA  Future Appointments  Date  Time Provider [place]  11/15/2021  4:00 PM Blanchie Serve, PhD CP[facility] None  11/20/2021  8:30 AM [facility] 2 [facility]  01/01/2022  8:15 AM [facility] LAB [facility] None  01/03/2022  8:15 AM Elayne Snare, MD [facility] None  02/20/2022  3:30 PM Marny Lowenstein A, NP [facility] None    No orders of the defined types were placed in this encounter.      -------------------------------

## 2021-11-07 ENCOUNTER — Other Ambulatory Visit: Payer: Self-pay | Admitting: Neurosurgery

## 2021-11-09 ENCOUNTER — Other Ambulatory Visit: Payer: Self-pay | Admitting: Psychiatry

## 2021-11-09 DIAGNOSIS — F332 Major depressive disorder, recurrent severe without psychotic features: Secondary | ICD-10-CM

## 2021-11-09 DIAGNOSIS — E638 Other specified nutritional deficiencies: Secondary | ICD-10-CM

## 2021-11-09 DIAGNOSIS — F4010 Social phobia, unspecified: Secondary | ICD-10-CM

## 2021-11-09 DIAGNOSIS — F422 Mixed obsessional thoughts and acts: Secondary | ICD-10-CM

## 2021-11-15 ENCOUNTER — Other Ambulatory Visit: Payer: Self-pay

## 2021-11-15 ENCOUNTER — Ambulatory Visit (INDEPENDENT_AMBULATORY_CARE_PROVIDER_SITE_OTHER): Payer: BC Managed Care – PPO | Admitting: Psychiatry

## 2021-11-15 DIAGNOSIS — F422 Mixed obsessional thoughts and acts: Secondary | ICD-10-CM

## 2021-11-15 DIAGNOSIS — F330 Major depressive disorder, recurrent, mild: Secondary | ICD-10-CM

## 2021-11-15 DIAGNOSIS — F4010 Social phobia, unspecified: Secondary | ICD-10-CM | POA: Diagnosis not present

## 2021-11-15 DIAGNOSIS — F332 Major depressive disorder, recurrent severe without psychotic features: Secondary | ICD-10-CM

## 2021-11-15 MED ORDER — CLOMIPRAMINE HCL 25 MG PO CAPS: 75.0000 mg | ORAL_CAPSULE | Freq: Every day | ORAL | 0 refills | Status: DC

## 2021-11-15 NOTE — Progress Notes (Unsigned)
Psychotherapy Progress Note [facility] [name], PhD LP  Patient ID: Ashlee Chavez)    MRN: [id number] Therapy format: Individual psychotherapy Date: 11/15/2021      Start: 4:18p     Stop: ***:***     Time Spent: *** min Location: {SvcLoc:22530::"In-person"}   Session narrative (presenting needs, interim history, self-report of stressors and symptoms, applications of prior therapy, status changes, and interventions made in session) Slew of medical appointments and specialists lately, racking up OOP expenses on the way to $7000 deductible.  Dealing with the whooshing sound in her ear, ENT and neurosurgeon have combined to articulate the need to check to see if she has a fistula.  Involves a dye test next Tuesday, apprehensive about 11/998 stroke risk.  Educated on the [employer] of passing a catheter through .  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    Has a man she's dating now Dietitian) who has offered to be her ride and her standby postsurgery.  Been going slow and steady getting to know each other.  Impressed how nice he is, and willing to get up 4:30am to take her to hospital.  He is a good conversationalist, in touch a couple times a week, came with her to [place] mass.    Therapeutic modalities: {AM:23362::"Cognitive Behavioral Therapy","Solution-Oriented/Positive Psychology"}  Mental Status/Observations:  Appearance:   {PSY:22683}     Behavior:  {PSY:21022743}  Motor:  {PSY:22302}  Speech/Language:   {PSY:22685}  Affect:  {PSY:22687}  Mood:  {PSY:31886}  Thought process:  {PSY:31888}  Thought content:    {PSY:850-132-4347}  Sensory/Perceptual disturbances:    {PSY:6144868530}  Orientation:  {Psych Orientation:23301::"Fully oriented"}  Attention:  {Good-Fair-Poor ratings:23770::"Good"}    Concentration:  {Good-Fair-Poor ratings:23770::"Good"}  Memory:   {PSY:240-467-9383}  Insight:    {Good-Fair-Poor ratings:23770::"Good"}  Judgment:   {Good-Fair-Poor ratings:23770::"Good"}  Impulse Control:  {Good-Fair-Poor ratings:23770::"Good"}   Risk Assessment: Danger to Self: {Risk:22599::"No"} Self-injurious Behavior: {Risk:22599::"No"} Danger to Others: {Risk:22599::"No"} Physical Aggression / Violence: {Risk:22599::"No"} Duty to Warn: {AMYesNo:22526::"No"} Access to Firearms a concern: {AMYesNo:22526::"No"}  Assessment of progress:  {Progress:22147::"progressing"}  Diagnosis: No diagnosis found. Plan:  *** Other recommendations/advice as may be noted above Continue to utilize previously learned skills ad lib Maintain medication as prescribed and work faithfully with relevant prescriber(s) if any changes are desired or seem indicated Call the clinic on-call service, 988/hotline, 911, or present to [facility] or ER if any life-threatening psychiatric crisis Return for prefer last of the day. Already scheduled visit in this office 01/29/2022.  Robley Fries, PhD [name], PhD LP Clinical Psychologist, [name] [address] [place], [place] [address] [phone]

## 2021-11-20 ENCOUNTER — Ambulatory Visit (HOSPITAL_COMMUNITY)
Admission: RE | Admit: 2021-11-20 | Discharge: 2021-11-20 | Disposition: A | Discharge status: Home / Self Care | Payer: BC Managed Care – PPO | Source: Ambulatory Visit | Attending: Neurosurgery | Admitting: Neurosurgery

## 2021-11-20 ENCOUNTER — Other Ambulatory Visit: Payer: Self-pay | Admitting: Neurosurgery

## 2021-11-20 ENCOUNTER — Other Ambulatory Visit: Payer: Self-pay

## 2021-11-20 DIAGNOSIS — I671 Cerebral aneurysm, nonruptured: Secondary | ICD-10-CM

## 2021-11-20 DIAGNOSIS — H93A2 Pulsatile tinnitus, left ear: Secondary | ICD-10-CM | POA: Diagnosis not present

## 2021-11-20 DIAGNOSIS — Q282 Arteriovenous malformation of cerebral vessels: Secondary | ICD-10-CM | POA: Diagnosis not present

## 2021-11-20 HISTORY — PX: IR ANGIO INTRA EXTRACRAN SEL INTERNAL CAROTID BILAT MOD SED: IMG5363

## 2021-11-20 HISTORY — PX: IR ANGIO VERTEBRAL SEL VERTEBRAL BILAT MOD SED: IMG5369

## 2021-11-20 HISTORY — PX: IR ANGIO VERTEBRAL SEL VERTEBRAL UNI L MOD SED: IMG5367

## 2021-11-20 HISTORY — PX: IR ANGIO EXTERNAL CAROTID SEL EXT CAROTID UNI R MOD SED: IMG5371

## 2021-11-20 LAB — CBC WITH DIFFERENTIAL/PLATELET
Abs Immature Granulocytes: 0.02 10*3/uL (ref 0.00–0.07)
Basophils Absolute: 0 10*3/uL (ref 0.0–0.1)
Basophils Relative: 0 %
Eosinophils Absolute: 0.1 10*3/uL (ref 0.0–0.5)
Eosinophils Relative: 2 %
HCT: 46.3 % — ABNORMAL HIGH (ref 36.0–46.0)
Hemoglobin: 15.4 g/dL — ABNORMAL HIGH (ref 12.0–15.0)
Immature Granulocytes: 0 %
Lymphocytes Relative: 15 %
Lymphs Abs: 1 10*3/uL (ref 0.7–4.0)
MCH: 32.6 pg (ref 26.0–34.0)
MCHC: 33.3 g/dL (ref 30.0–36.0)
MCV: 98.1 fL (ref 80.0–100.0)
Monocytes Absolute: 0.4 10*3/uL (ref 0.1–1.0)
Monocytes Relative: 6 %
Neutro Abs: 5.3 10*3/uL (ref 1.7–7.7)
Neutrophils Relative %: 77 %
Platelets: 156 10*3/uL (ref 150–400)
RBC: 4.72 MIL/uL (ref 3.87–5.11)
RDW: 13.1 % (ref 11.5–15.5)
WBC: 6.9 10*3/uL (ref 4.0–10.5)
nRBC: 0 % (ref 0.0–0.2)

## 2021-11-20 LAB — BASIC METABOLIC PANEL
Anion gap: 9 (ref 5–15)
BUN: 20 mg/dL (ref 8–23)
CO2: 26 mmol/L (ref 22–32)
Calcium: 9.3 mg/dL (ref 8.9–10.3)
Chloride: 103 mmol/L (ref 98–111)
Creatinine, Ser: 0.82 mg/dL (ref 0.44–1.00)
GFR, Estimated: >60 mL/min (ref >60)
Glucose, Bld: 79 mg/dL (ref 70–99)
Potassium: 4.2 mmol/L (ref 3.5–5.1)
Sodium: 138 mmol/L (ref 135–145)

## 2021-11-20 LAB — PROTIME-INR
INR: 1 (ref 0.8–1.2)
Prothrombin Time: 13.1 seconds (ref 11.4–15.2)

## 2021-11-20 LAB — APTT: aPTT: 31 seconds (ref 24–36)

## 2021-11-20 MED ORDER — IOHEXOL 300 MG/ML  SOLN: 100.0000 mL | Freq: Once | INTRAMUSCULAR | Status: DC | PRN

## 2021-11-20 MED ORDER — FENTANYL CITRATE (PF) 100 MCG/2ML IJ SOLN
INTRAMUSCULAR | Status: AC | PRN
  Administered 2021-11-20: 25 ug via INTRAVENOUS

## 2021-11-20 MED ORDER — VANCOMYCIN HCL IN DEXTROSE 1-5 GM/200ML-% IV SOLN: 1000.0000 mg | INTRAVENOUS | Status: DC

## 2021-11-20 MED ORDER — HEPARIN SODIUM (PORCINE) 1000 UNIT/ML IJ SOLN
INTRAMUSCULAR | Status: AC
  Filled 2021-11-20: qty 10

## 2021-11-20 MED ORDER — MIDAZOLAM HCL 2 MG/2ML IJ SOLN
INTRAMUSCULAR | Status: AC | PRN
Start: 2021-11-20 — End: 2021-11-20
  Administered 2021-11-20: 1 mg via INTRAVENOUS

## 2021-11-20 MED ORDER — SODIUM CHLORIDE 0.9 % IV SOLN: INTRAVENOUS | Status: DC

## 2021-11-20 MED ORDER — LIDOCAINE HCL 1 % IJ SOLN
INTRAMUSCULAR | Status: AC
  Administered 2021-11-20: 8 mL via SUBCUTANEOUS
  Filled 2021-11-20: qty 20

## 2021-11-20 MED ORDER — FENTANYL CITRATE (PF) 100 MCG/2ML IJ SOLN
INTRAMUSCULAR | Status: AC
  Filled 2021-11-20: qty 2

## 2021-11-20 MED ORDER — MIDAZOLAM HCL 2 MG/2ML IJ SOLN
INTRAMUSCULAR | Status: AC
  Filled 2021-11-20: qty 2

## 2021-11-20 MED ORDER — HEPARIN SODIUM (PORCINE) 1000 UNIT/ML IJ SOLN
INTRAMUSCULAR | Status: AC | PRN
Start: 2021-11-20 — End: 2021-11-20
  Administered 2021-11-20: 2000 [IU] via INTRAVENOUS

## 2021-11-20 MED ORDER — HYDROCODONE-ACETAMINOPHEN 5-325 MG PO TABS: 1.0000 | ORAL_TABLET | ORAL | Status: DC | PRN

## 2021-11-20 NOTE — Progress Notes (Signed)
Up and walked and tolerated well; right groin stable, no bleeding or hematoma 

## 2021-11-20 NOTE — Brief Op Note (Signed)
°  NEUROSURGERY BRIEF OPERATIVE  NOTE   PREOP DX: Pulsatile tinnitus  POSTOP DX: Same  PROCEDURE: Diagnostic cerebral angiogram  SURGEON: Dr. Lisbeth Renshaw, MD  ANESTHESIA: IV Sedation with Local  EBL: Minimal  SPECIMENS: None  COMPLICATIONS: None  CONDITION: Stable to recovery  FINDINGS (Full report in [facility]): 1. 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

## 2021-11-20 NOTE — H&P (Signed)
Chief Complaint   Pulsatile tinnitus  History of Present Illness  Ashlee Chavez is a 61 y.o. female initially seen in the outpatient neurosurgery clinic complaining of several months of left-sided pulsatile tinnitus.  Symptoms started without any identifiable inciting event, quite abruptly after she bent over 1 day.  She does not complain of associated headache, dizziness, or other cranial neuropathy.  No new numbness tingling or weakness.  Past Medical History   Past Medical History:  Diagnosis Date   Gum lesion    Biopsy done-benign   Hypercholesteremia    Hypothyroid    LGSIL (low grade squamous intraepithelial dysplasia) 2010   Migraines    OCD (obsessive compulsive disorder)    Osteoporosis 01/2016   T score -2.7    Past Surgical History   Past Surgical History:  Procedure Laterality Date   BREAST LUMPECTOMY  2001   right-benign   CATARACT EXTRACTION  2005   Gum Biopsy     Benign-2019    Social History   Social History   Tobacco Use   Smoking status: Never   Smokeless tobacco: Never  Vaping Use   Vaping Use: Never used  Substance Use Topics   Alcohol use: Yes    Alcohol/week: 0.0 standard drinks    Comment: wine occassionally   Drug use: No    Medications   Prior to Admission medications   Medication Sig Start Date End Date Taking? Authorizing Provider  atorvastatin (LIPITOR) 20 MG tablet TAKE 1 TABLET BY MOUTH EVERY DAY 08/09/21  Yes Reather Littler, MD  Calcium Carbonate-Vitamin D (OSCAL 500/200 D-3 PO) Take by mouth.   Yes [provider]  Cholecalciferol (VITAMIN D PO) Take by mouth.   Yes [provider]  clomiPRAMINE (ANAFRANIL) 25 MG capsule Take 3 capsules (75 mg total) by mouth at bedtime. 11/15/21  Yes Cottle, Steva Ready., MD  fluvoxaMINE (LUVOX) 100 MG tablet Take 0.5 tablets (50 mg total) by mouth 2 (two) times daily. 08/13/21  Yes Cottle, Steva Ready., MD  levothyroxine (SYNTHROID) 100 MCG tablet TAKE 1 TABLET BY MOUTH EVERY DAY  09/28/21  Yes Reather Littler, MD  methylphenidate (CONCERTA) 36 MG PO CR tablet Take 2 tablets (72 mg total) by mouth daily. 12/25/21  Yes Cottle, Steva Ready., MD  methylphenidate 36 MG PO CR tablet Take 1 tablet (36 mg total) by mouth 2 (two) times daily. 11/27/21  Yes Cottle, Steva Ready., MD  OLANZapine (ZYPREXA) 10 MG tablet TAKE 1 TABLET BY MOUTH EVERYDAY AT BEDTIME 11/09/21  Yes Cottle, Steva Ready., MD  propranolol (INDERAL) 20 MG tablet TAKE 1 TO 2 TABLETS TWICE A DAY AS NEEDED FOR TREMOR 10/26/20  Yes Cottle, Steva Ready., MD  SUMAtriptan (IMITREX) 100 MG tablet Take 100 mg by mouth daily as needed for migraine.   Yes [provider]  topiramate (TOPAMAX) 100 MG tablet TAKE 1 TABLET BY MOUTH TWICE A DAY 09/10/21  Yes Cottle, Steva Ready., MD  ALPRAZolam Prudy Feeler) 0.25 MG tablet Take 1 tablet (0.25 mg total) by mouth daily as needed for anxiety. 04/04/20   Cottle, Steva Ready., MD  methylphenidate 36 MG PO CR tablet Take 1 tablet (36 mg total) by mouth 2 (two) times daily. 10/30/21   Cottle, Steva Ready., MD    Allergies   Allergies  Allergen Reactions   Ampicillin Rash    Review of Systems  ROS  Neurologic Exam  Awake, alert, oriented Memory and concentration grossly intact  Speech fluent, appropriate CN grossly intact Motor exam: Upper Extremities Deltoid Bicep Tricep Grip  Right 5/5 5/5 5/5 5/5  Left 5/5 5/5 5/5 5/5   Lower Extremities IP Quad PF DF EHL  Right 5/5 5/5 5/5 5/5 5/5  Left 5/5 5/5 5/5 5/5 5/5   Sensation grossly intact to LT  Imaging  CT angiogram was personally reviewed demonstrating a hypertrophic left occipital artery.  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  We have reviewed the associated risks, benefits, and alternatives to the procedure.  All her questions today were answered.  She provided informed consent to proceed.  Lisbeth Renshaw, MD [facility]

## 2021-11-20 NOTE — Sedation Documentation (Signed)
Pt transported to Short stay room 5 via bed accompanied by RN and Transport. Melanie at bedside to receive pt. Handoff completed. Right groin remains level 0. Bilateral lower distal pulses palpable. Pt awake alert and oriented. No s/s of distress at this time.

## 2021-11-26 ENCOUNTER — Encounter (HOSPITAL_COMMUNITY): Payer: Self-pay

## 2021-11-26 ENCOUNTER — Other Ambulatory Visit: Payer: Self-pay | Admitting: Neurosurgery

## 2021-11-26 DIAGNOSIS — C44319 Basal cell carcinoma of skin of other parts of face: Secondary | ICD-10-CM | POA: Diagnosis not present

## 2021-11-26 DIAGNOSIS — L814 Other melanin hyperpigmentation: Secondary | ICD-10-CM | POA: Diagnosis not present

## 2021-11-26 DIAGNOSIS — I671 Cerebral aneurysm, nonruptured: Secondary | ICD-10-CM

## 2021-11-26 DIAGNOSIS — D1801 Hemangioma of skin and subcutaneous tissue: Secondary | ICD-10-CM | POA: Diagnosis not present

## 2021-11-26 DIAGNOSIS — L821 Other seborrheic keratosis: Secondary | ICD-10-CM | POA: Diagnosis not present

## 2021-11-28 DIAGNOSIS — I671 Cerebral aneurysm, nonruptured: Secondary | ICD-10-CM | POA: Diagnosis not present

## 2021-12-04 ENCOUNTER — Other Ambulatory Visit: Payer: Self-pay | Admitting: Psychiatry

## 2021-12-11 DIAGNOSIS — Z481 Encounter for planned postprocedural wound closure: Secondary | ICD-10-CM | POA: Diagnosis not present

## 2021-12-11 DIAGNOSIS — C44311 Basal cell carcinoma of skin of nose: Secondary | ICD-10-CM | POA: Diagnosis not present

## 2021-12-11 DIAGNOSIS — C4491 Basal cell carcinoma of skin, unspecified: Secondary | ICD-10-CM | POA: Diagnosis not present

## 2022-01-01 ENCOUNTER — Other Ambulatory Visit (INDEPENDENT_AMBULATORY_CARE_PROVIDER_SITE_OTHER): Payer: BC Managed Care – PPO

## 2022-01-01 ENCOUNTER — Other Ambulatory Visit: Payer: Self-pay

## 2022-01-01 DIAGNOSIS — M810 Age-related osteoporosis without current pathological fracture: Secondary | ICD-10-CM

## 2022-01-01 DIAGNOSIS — R7301 Impaired fasting glucose: Secondary | ICD-10-CM

## 2022-01-01 DIAGNOSIS — E063 Autoimmune thyroiditis: Secondary | ICD-10-CM | POA: Diagnosis not present

## 2022-01-01 DIAGNOSIS — E7800 Pure hypercholesterolemia, unspecified: Secondary | ICD-10-CM | POA: Diagnosis not present

## 2022-01-01 LAB — LIPID PANEL
Cholesterol: 167 mg/dL (ref 0–200)
HDL: 73.2 mg/dL (ref >39.00)
LDL Cholesterol: 80 mg/dL (ref 0–99)
NonHDL: 94.27
Total CHOL/HDL Ratio: 2
Triglycerides: 70 mg/dL (ref 0.0–149.0)
VLDL: 14 mg/dL (ref 0.0–40.0)

## 2022-01-01 LAB — COMPREHENSIVE METABOLIC PANEL
ALT: 50 U/L — ABNORMAL HIGH (ref 0–35)
AST: 42 U/L — ABNORMAL HIGH (ref 0–37)
Albumin: 4.1 g/dL (ref 3.5–5.2)
Alkaline Phosphatase: 57 U/L (ref 39–117)
BUN: 24 mg/dL — ABNORMAL HIGH (ref 6–23)
CO2: 31 mEq/L (ref 19–32)
Calcium: 9.8 mg/dL (ref 8.4–10.5)
Chloride: 108 mEq/L (ref 96–112)
Creatinine, Ser: 0.85 mg/dL (ref 0.40–1.20)
GFR: 73.54 mL/min (ref >60.00)
Glucose, Bld: 87 mg/dL (ref 70–99)
Potassium: 4.6 mEq/L (ref 3.5–5.1)
Sodium: 143 mEq/L (ref 135–145)
Total Bilirubin: 0.3 mg/dL (ref 0.2–1.2)
Total Protein: 6.3 g/dL (ref 6.0–8.3)

## 2022-01-01 LAB — TSH: TSH: 1.12 u[IU]/mL (ref 0.35–5.50)

## 2022-01-01 LAB — HEMOGLOBIN A1C: Hgb A1c MFr Bld: 6 % (ref 4.6–6.5)

## 2022-01-01 LAB — T4, FREE: Free T4: 3.25 ng/dL — ABNORMAL HIGH (ref 0.60–1.60)

## 2022-01-02 NOTE — Progress Notes (Signed)
Patient ID: Ashlee Chavez, female   DOB: 21-Mar-1961, 61 y.o.   MRN: [id number]           Chief complaint: Endocrinology follow-up  History of Present Illness:    OSTEOPOROSIS:   She had a bone density done in 2017 and this showed the following T-scores: Femoral neck: -2.7, previously -1.8 Spine: -1.9  By comparison she had a decline of 8-10 % in her bone density from 2 years before and 15% result from baseline She initially had a half inch height loss from baseline  She could not afford Evista and did not want to consider Reclast because of the possible high one time cost  In 05/2016 she was started on treatment with risedronate 150 mg for her declining bone density  She has finished her 5-year regimen and stopped it in 8/22  She is taking calcium supplements, regular on Os-Cal. Also regular with taking 5000 units of vitamin D with consistently normal vitamin D levels  BONE density: Repeat study done by gynecologist in April 2021 shows T-score of the femoral neck to be -2.4 compared to -2.7  LABS:  Lab Results  Component Value Date   VD25OH 47.92 11/14/2020   VD25OH 50.65 05/02/2020    HYPOTHYROIDISM:  She had a goiter diagnosed in 1996. She had a positive peroxidase antibody and has been on thyroxine since 1996  Her TSH was high in 2006 and she had symptoms of fatigue, cold intolerance and some hair loss  Since then has been on 75 mcg of Synthroid with occasional adjustments in the dose  Since 01/2014 she had been taking the same dose of levothyroxine, 75 g daily  Her TSH was higher than usual in 11/16 and she was having complaints about depression, insomnia, fatigue and more difficulties with her anxiety  Has had chronic cold sensitivity   Her dose was increased in 7/17 and in 1/19 it was further increased to to 137 mcg because of a high TSH She was not having any unusual fatigue but her TSH was gradually increasing She was initially having some palpitations and  shakiness but no symptoms on the visit when she was on 125 mcg  Recent history:   Since 8/21 has been taking 100 mcg levothyroxine 6-1/2 tablets a week with the half tablet on Sundays TSH is again normal Her weight has gone up slightly Overall she feels fairly good She does have a history of a small goiter  He has been consistent with taking the levothyroxine in the morning before breakfast.  She will take her multivitamin and calcium separately   Wt Readings from Last 3 Encounters:  01/03/22 121 lb (54.9 kg)  11/20/21 115 lb (52.2 kg)  06/26/21 114 lb 6.4 oz (51.9 kg)    Lab Results  Component Value Date   TSH 1.12 01/01/2022   TSH 1.00 06/21/2021   TSH 0.65 11/14/2020   FREET4 3.25 (H) 01/01/2022   FREET4 1.33 06/21/2021   FREET4 0.96 11/14/2020    OTHER active problems discussed and review of systems   Past Medical History:  Diagnosis Date   Gum lesion    Biopsy done-benign   Hypercholesteremia    Hypothyroid    LGSIL (low grade squamous intraepithelial dysplasia) 2010   Migraines    OCD (obsessive compulsive disorder)    Osteoporosis 01/2016   T score -2.7    Past Surgical History:  Procedure Laterality Date   BREAST LUMPECTOMY  2001   right-benign   CATARACT EXTRACTION  2005   Gum Biopsy     Benign-2019   IR ANGIO EXTERNAL CAROTID SEL EXT CAROTID UNI R MOD SED  11/20/2021   IR ANGIO INTRA EXTRACRAN SEL INTERNAL CAROTID BILAT MOD SED  11/20/2021   IR ANGIO VERTEBRAL SEL VERTEBRAL BILAT MOD SED  11/20/2021  `  Family History  Problem Relation Age of Onset   Hypertension Mother    Heart disease Mother    Hypertension Father    Breast cancer Paternal Aunt        Age 16   Diabetes Neg Hx     Social History:  reports that she has never smoked. She has never used smokeless tobacco. She reports current alcohol use. She reports that she does not use drugs.  Allergies:  Allergies  Allergen Reactions   Ampicillin Rash    Allergies as of 01/03/2022        Reactions   Ampicillin Rash        Medication List        Accurate as of January 03, 2022  8:43 AM. If you have any questions, ask your nurse or doctor.          ALPRAZolam 0.25 MG tablet Commonly known as: XANAX Take 1 tablet (0.25 mg total) by mouth daily as needed for anxiety.   atorvastatin 20 MG tablet Commonly known as: LIPITOR TAKE 1 TABLET BY MOUTH EVERY DAY   clomiPRAMINE 25 MG capsule Commonly known as: ANAFRANIL Take 3 capsules (75 mg total) by mouth at bedtime.   fluvoxaMINE 100 MG tablet Commonly known as: LUVOX Take 0.5 tablets (50 mg total) by mouth 2 (two) times daily.   levothyroxine 100 MCG tablet Commonly known as: SYNTHROID TAKE 1 TABLET BY MOUTH EVERY DAY   methylphenidate 36 MG CR tablet Commonly known as: CONCERTA Take 1 tablet (36 mg total) by mouth 2 (two) times daily.   methylphenidate 36 MG CR tablet Commonly known as: CONCERTA Take 1 tablet (36 mg total) by mouth 2 (two) times daily.   methylphenidate 36 MG CR tablet Commonly known as: Concerta Take 2 tablets (72 mg total) by mouth daily.   OLANZapine 10 MG tablet Commonly known as: ZYPREXA TAKE 1 TABLET BY MOUTH EVERYDAY AT BEDTIME   OSCAL 500/200 D-3 PO Take by mouth.   propranolol 20 MG tablet Commonly known as: INDERAL TAKE 1 TO 2 TABLETS TWICE A DAY AS NEEDED FOR TREMOR   SUMAtriptan 100 MG tablet Commonly known as: IMITREX Take 100 mg by mouth daily as needed for migraine.   topiramate 100 MG tablet Commonly known as: TOPAMAX TAKE 1 TABLET BY MOUTH TWICE A DAY   VITAMIN D PO Take by mouth.         Review of Systems    HYPERCHOLESTEROLEMIA: She has been on Lipitor 20 mg for several years  LDL usually at target, now being followed by PCP also She has no other risk factors besides hypercholesterolemia  She tries to follow a low-fat diet  Lab Results  Component Value Date   CHOL 167 01/01/2022   HDL 73.20 01/01/2022   LDLCALC 80 01/01/2022    TRIG 70.0 01/01/2022   CHOLHDL 2 01/01/2022    GLUCOSE: Her fasting glucose has occasionally been high in the past Recently glucose 87  A1c is variable, higher than expected   No family history of diabetes  Lab Results  Component Value Date   HGBA1C 6.0 01/01/2022   HGBA1C 5.5 05/02/2020   HGBA1C 6.1 10/26/2019  Lab Results  Component Value Date   LDLCALC 80 01/01/2022   CREATININE 0.85 01/01/2022   Abnormal liver tests:  She is on new medications for her OCD Also takes aspirin 2 tablets twice a day for headaches regularly  Lab Results  Component Value Date   ALT 50 (H) 01/01/2022      PHYSICAL EXAM:  BP 104/72    Pulse 76    Ht 5\' 4"  (1.626 m)    Wt 121 lb (54.9 kg)    LMP 11/19/2010    SpO2 99%    BMI 20.77 kg/m   She looks well  Right thyroid lobe is  enlarged 1-1/2 times normal, firm and smooth.  Left lobe not palpable   ASSESSMENT:    HYPOTHYROIDISM:   She has longstanding primary hypothyroidism with a small goiter, likely from Hashimoto's thyroiditis  She is on a stable dose of 6-1/2 tablets a week of the 100 mcg levothyroxine Subjectively doing well Has only a small goiter which is unchanged on the right side  Her TSH is consistently in the normal range Free T4 is high possibly from her multivitamins with B complex   Osteoporosis: Bone density was improved as of 4/21 She had taken Actonel for 5 years until 2022 Also on vitamin D supplements  Lipids: Well-controlled  Abnormal liver functions: This is new and not clear of the etiology   PLAN:  She will continue on 100 mcg levothyroxine, 6-1/2 tablets/week She will stop her B vitamins for 2 weeks before her labs  Continue vitamin D3, 5000 units daily and recheck labs on the next visit  She will have bone density ordered by her gynecologist this year  Encouraged her to reduce her aspirin intake but discuss abnormal liver function with her PCP and psychiatrist  No change in  Lipitor  Follow-up in 6 months    Corben Auzenne 01/03/2022, 8:43 AM

## 2022-01-03 ENCOUNTER — Other Ambulatory Visit: Payer: Self-pay

## 2022-01-03 ENCOUNTER — Ambulatory Visit (INDEPENDENT_AMBULATORY_CARE_PROVIDER_SITE_OTHER): Payer: BC Managed Care – PPO | Admitting: Endocrinology

## 2022-01-03 ENCOUNTER — Encounter: Payer: Self-pay | Admitting: Endocrinology

## 2022-01-03 VITALS — BP 104/72 | HR 76 | Ht 64.0 in | Wt 121.0 lb

## 2022-01-03 DIAGNOSIS — E063 Autoimmune thyroiditis: Secondary | ICD-10-CM | POA: Diagnosis not present

## 2022-01-03 DIAGNOSIS — E7800 Pure hypercholesterolemia, unspecified: Secondary | ICD-10-CM

## 2022-01-03 DIAGNOSIS — E559 Vitamin D deficiency, unspecified: Secondary | ICD-10-CM

## 2022-01-03 DIAGNOSIS — R7301 Impaired fasting glucose: Secondary | ICD-10-CM

## 2022-01-09 DIAGNOSIS — Z1231 Encounter for screening mammogram for malignant neoplasm of breast: Secondary | ICD-10-CM | POA: Diagnosis not present

## 2022-01-17 ENCOUNTER — Ambulatory Visit (INDEPENDENT_AMBULATORY_CARE_PROVIDER_SITE_OTHER): Payer: BC Managed Care – PPO | Admitting: Psychiatry

## 2022-01-17 ENCOUNTER — Other Ambulatory Visit: Payer: Self-pay

## 2022-01-17 DIAGNOSIS — F422 Mixed obsessional thoughts and acts: Secondary | ICD-10-CM | POA: Diagnosis not present

## 2022-01-17 DIAGNOSIS — F4010 Social phobia, unspecified: Secondary | ICD-10-CM | POA: Diagnosis not present

## 2022-01-17 DIAGNOSIS — F330 Major depressive disorder, recurrent, mild: Secondary | ICD-10-CM | POA: Diagnosis not present

## 2022-01-17 NOTE — Progress Notes (Unsigned)
Psychotherapy Progress Note [facility] [name], PhD LP  Patient ID: Ashlee Chavez)    MRN: [id number] Therapy format: Individual psychotherapy Date: 01/17/2022      Start: 4:18p     Stop: ***:***     Time Spent: *** min Location: In-person   Session narrative (presenting needs, interim history, self-report of stressors and symptoms, applications of prior therapy, status changes, and interventions made in session) Medical developments of late, notably not obsessing.  Has a suspicious mammogram, will have f/u testing.   Hx of a benign tumor and lumpectomy.  Not unbearable waiting for appt a week, found that she could handle with prayer.  Credits clomipramine and olanzapine.  Appetite strong, getting uncomfortable, but able to control munchies, no terrible carb cravings.  Had the Mohs surgery for skin cancer on her nose, followup clean, still self-conscious about the  scar on her nose.  Plan is q 6 months for 2 years, and freedom to use scar tape now, sanding or laser at 6 months out.  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  At this point, the sound is not intrusive until it's very quiet and late, and then it's really just a sound to fall asleep to.    Tomorrow nervous about the mammogram.  [name] offered to come accompany her, but it's not that provocative or necessary.  Figures to call him instead.  Getting along well.  Disorienting to be flattered so much by him, and trying to figure out whetehr she's obsessing or cares about him.  He's disorientingly patient and caring.  Interpreted her state as much like being post-divorce -- she felt all in for [name], too, then he cut off.   Acknowledges shes been trusting him a lot, e.g., letting him sleep over on Saturday before mass.  Has him sending her his bowling scores now.  Finds herself thinking back on her relationship with  Billey Gosling --   Cat got sick recently, means a deep carpet cleaning.  And [name] will come over early Saturday to help.  Discussed  Gave permission to call a weekend off from working relationship recovery  Therapeutic modalities: {AM:23362::"Cognitive Behavioral Therapy","Solution-Oriented/Positive Psychology"}  Mental Status/Observations:  Appearance:   {PSY:22683}     Behavior:  {PSY:21022743}  Motor:  {PSY:22302}  Speech/Language:   {PSY:22685}  Affect:  {PSY:22687}  Mood:  {PSY:31886}  Thought process:  {PSY:31888}  Thought content:    {PSY:[phone]}  Sensory/Perceptual disturbances:    {[phone]}  Orientation:  {Psych Orientation:23301::"Fully oriented"}  Attention:  {Good-Fair-Poor ratings:23770::"Good"}    Concentration:  {Good-Fair-Poor ratings:23770::"Good"}  Memory:  {PSY:[phone]}  Insight:    {Good-Fair-Poor ratings:23770::"Good"}  Judgment:   {Good-Fair-Poor ratings:23770::"Good"}  Impulse Control:  {Good-Fair-Poor ratings:23770::"Good"}   Risk Assessment: Danger to Self: {Risk:22599::"No"} Self-injurious Behavior: {Risk:22599::"No"} Danger to Others: {Risk:22599::"No"} Physical Aggression / Violence: {Risk:22599::"No"} Duty to Warn: {AMYesNo:22526::"No"} Access to Firearms a concern: {AMYesNo:22526::"No"}  Assessment of progress:  {Progress:22147::"progressing"}  Diagnosis:   ICD-10-CM   1. Social anxiety disorder  F40.10     2. 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 Already scheduled visit in this office 01/29/2022.  Robley Fries, PhD [name], PhD LP Clinical Psychologist, [facility] [address], [address] [place], [place] [address] [phone]

## 2022-01-18 DIAGNOSIS — R922 Inconclusive mammogram: Secondary | ICD-10-CM | POA: Diagnosis not present

## 2022-01-18 DIAGNOSIS — N6489 Other specified disorders of breast: Secondary | ICD-10-CM | POA: Diagnosis not present

## 2022-01-18 DIAGNOSIS — R928 Other abnormal and inconclusive findings on diagnostic imaging of breast: Secondary | ICD-10-CM | POA: Diagnosis not present

## 2022-01-22 ENCOUNTER — Other Ambulatory Visit: Payer: Self-pay

## 2022-01-22 MED ORDER — METHYLPHENIDATE HCL ER 36 MG PO TB24: 72.0000 mg | ORAL_TABLET | Freq: Every day | ORAL | 0 refills | Status: DC

## 2022-01-28 ENCOUNTER — Other Ambulatory Visit: Payer: Self-pay | Admitting: Psychiatry

## 2022-01-29 ENCOUNTER — Other Ambulatory Visit: Payer: Self-pay

## 2022-01-29 ENCOUNTER — Encounter: Payer: Self-pay | Admitting: Psychiatry

## 2022-01-29 ENCOUNTER — Ambulatory Visit: Payer: BC Managed Care – PPO | Admitting: Psychiatry

## 2022-01-29 VITALS — BP 120/74 | HR 77

## 2022-01-29 DIAGNOSIS — F4010 Social phobia, unspecified: Secondary | ICD-10-CM | POA: Diagnosis not present

## 2022-01-29 DIAGNOSIS — F422 Mixed obsessional thoughts and acts: Secondary | ICD-10-CM

## 2022-01-29 DIAGNOSIS — F5102 Adjustment insomnia: Secondary | ICD-10-CM

## 2022-01-29 DIAGNOSIS — D241 Benign neoplasm of right breast: Secondary | ICD-10-CM | POA: Diagnosis not present

## 2022-01-29 DIAGNOSIS — N6314 Unspecified lump in the right breast, lower inner quadrant: Secondary | ICD-10-CM | POA: Diagnosis not present

## 2022-01-29 DIAGNOSIS — F332 Major depressive disorder, recurrent severe without psychotic features: Secondary | ICD-10-CM

## 2022-01-29 DIAGNOSIS — F330 Major depressive disorder, recurrent, mild: Secondary | ICD-10-CM

## 2022-01-29 DIAGNOSIS — F900 Attention-deficit hyperactivity disorder, predominantly inattentive type: Secondary | ICD-10-CM | POA: Diagnosis not present

## 2022-01-29 MED ORDER — METHYLPHENIDATE HCL ER 36 MG PO TB24: 72.0000 mg | ORAL_TABLET | Freq: Every day | ORAL | 0 refills | Status: DC

## 2022-01-29 MED ORDER — METHYLPHENIDATE HCL ER (OSM) 36 MG PO TBCR
36.0000 mg | EXTENDED_RELEASE_TABLET | Freq: Every day | ORAL | 0 refills | Status: DC
Start: 2022-02-26 — End: 2022-02-20

## 2022-01-29 MED ORDER — FLUVOXAMINE MALEATE 100 MG PO TABS: ORAL_TABLET | ORAL | 0 refills | Status: DC

## 2022-01-29 NOTE — Progress Notes (Signed)
Ashlee Chavez ?[id number] ?03-May-1961 ?61 y.o. ? ?Subjective:  ? ?Patient ID:  Ashlee Chavez is a 61 y.o. (DOB [birth year]) female. ? ?Chief Complaint:  ?Chief Complaint  ?Patient presents with  ? Follow-up  ? Anxiety  ? Depression  ? ADD  ? ? ?Depression ?       Associated symptoms include headaches.  Associated symptoms include no decreased concentration, no fatigue, no appetite change and no suicidal ideas.  Past medical history includes anxiety.   ?Anxiety ?Patient reports no confusion, decreased concentration, dizziness, palpitations or suicidal ideas.  ? ? ?Ashlee Chavez presents today for follow-up of recently worse depression DT relationship loss.  ? ? When seen February 10, 2019.  She had received some benefit from Ritalin 20 mg 3 times daily but was having issues with the crash.  We switched to Concerta 72 mg every morning. ? ?September 14, 2019.  She was having persistent bothersome OCD and was interested in further medicine changes to try to help with these symptoms because she has aggressively pursued therapy and still has significant symptoms.  She has been on multiple SSRIs and so we initiated augmentation with risperidone half milligram daily to increase to 1 mg daily. ?Sig improvement with risperidone.  SE low appetite with it.  Not hungry for dinner. Sleep so much better also.  Improvement is "marked".  Think better bc less obsessing time.  Not constant now.  Noticed benefit within the first week.  Less obsessing lessened depression.  But residual symptoms. ? ?visit October 14, 2019.  She was encouraged to experiment with the dosing of the risperidone up to 2 mg or 3 mg nightly to see if she could get additional reduction in her chronic anxiety and OCD.  ? ?seen December 15, 2019.  Settled at 2 mg risperidone bc increased sleep.  Sleep is so much better.  Harder to get up in the morning.   ?Found out insurance won't cover Concerta anymore.  Not as good on Ritalin TID.  Not as even a response.   Terrible insurance. ?Therefore the only med change with switching to Focalin to see if it would be covered better. ? ?Appt 03/09/20, switch to Concerta 72 mg is better with better mood and motivation vs Focalin.  Focus is also better.  Very poor function with Focalin and better with Concerta.   ?Better overall with more even response to stimulant and better appetite.  No SE.  Duration all day.  Less desperate hopeless feelings on the stimulant.  Stressful times. ?With increase risperidone a little better mood bump.  SE a little shakiness noticed in jaw.   ?Not unusual levels of anxiety.  Lost her best friend 1 year ago and still grieving.  Not much family. Alone.  Not close to B and S who live elsewhere.  Talks to God daily.  Gets to work daily.  Function is good there.  Reads and calls people.  OCD no worse with Covid. ?Did join a Bible study which was a huge step. ?Plan start lamotrigine  ? ?04/04/20 appt with the following noted: ?Appt moved up.  She thought she was supposed to stop risperidone when started lamotrigine.  Started having SI off the risperidone but wasn't sure if it was SE.  No risperidone 3 mg for 3 weeks.Anxiety shaking sweating crying all started right away.  Also trouble with sleep and can't eat.  ?Plan:Restart risperidone 1 mg now and another milligram in 2 to 3 hours if not feeling  sufficient relief to get the full dose of 3 mg today.  Then resume 3 mg risperidone each evening. ?Restart lamotrigine 25 mg daily for 2 weeks and follow the previous instructions on increasing it. ? ?05/11/2020 appointment the following is noted: ?Marked improvement back on risperidone 3 mg.  SE shakes jaw.  Doesn't affect typing. ?Started lamotrigine and up to 100 mg for 2 weeks.  Feels it's helped depression. ?Better mood and able to laugh.  Able to get pizza for Bible study and not cry. ?Apptetite is not great.  Working on it. ?Anxiety is better not gone. ?Plan: no med changes ? ?07/26/20 appt with the following  noted; ?Increased lamotrigine gradually to 200 mg daily over the phone 6 weeks ago.  No improvement really for depression. With it. ?CC depression.   ?Risperidone still helping anxiety. ?Plan: Increase lamotrigine to 300 mg daily.. ? ?09/14/2020 appointment with following noted: ?Started taper of lamotrigine DT NR. ?Started latuda 20 mg daily for 10 days.  Tolerated fine.  No effects so far.Marland Kitchen ?Main problem is still depression.  Anxiety is not too bad.  No energy, motivation, sleeeping a lot.  No sig SI. ?In past started risperidone for anxiety and depression.  ?Increase Latuda to 40 mg daily. ?Disc potential DDI with risperidone reducing effectiveness of  Latuda so reduc risperidone to 1.5 mg HS ? ?10/09/2020 phone call from patient: ?Patient left a voicemail on Friday while we were out of office for the Holiday that she is not any better. She feels the cutting in the 1/2 of Risperidone has really increased her anxiety. She was instructed to call back if not any better but concerned that taking away the Risperidone will really exacerbate her anxiety.  ?MD response:  At the last appointment we increased Latuda to 40 mg daily and reduced risperidone to half tablet daily.  The goal was to use Latuda and eliminate risperidone.  However since her anxiety has worsened with the reduction in risperidone, I agree with her and she should take the full dose of risperidone as well as continue the Latuda 40 mg daily.  The [name] is intended to help depression. ? ?11/23/2020 appointment with the following noted: ?Propranolol only once daily. ?No better with higher Latuda.  No SE either. ?No change in appetite which is poor.  Still struggling with depression and OCD is a little worse with changes at work. ?Off lamotrigine. ?Plan: 5-HTP supplement 200 mg daily ? Increase Latuda to 60 mg  for 3 weeks, ?If no benefit and no SE increase to 80 mg daily with food. ? ADD still better with Concerta 72 mg versus Focalin. ?Continue high  dosage paroxetine 80 mg daily. ? ?01/16/2021 appointment with following noted: ?Increase Latuda to 80 mg without benefit or SE.  Anxiety and depression with poor STM.  Food getting caught in her throat.  No change in eating.  No change in weight.   ?Anxious thoughts mostly about work even when not there.  Anxious about work needed on the house she can't afford.  Enjoys reading but feels guilty that she should be doing something else.  More general anxiety than OCD. ?5-HTP supplement 200 mg daily ?Wean Latuda over a week DT NR ?Reduce mirtazapine to 30 mg HS  ?ADD still better with Concerta 72 mg versus Focalin. ?Start clomipramine 25 mg capsules 1 at night for 1 week, ?Then increase clomipramine to 2 capsules at night and reduce paroxetine to 1-1/2 tablets or 60 mg daily. ?Wait 1 week  and then get the blood test ?  ?03/15/2021 appt noted: ?OK not great.  About the same.  Not much change in the last 8 weeks. ?SE none ?Lab 02/20/21 clomipramine 50 + Luvox 100 = clomip 195 + norclomip 111= total 306 ?Losing weight and no appetite and hard to eat. Weight loss to 107#. ?A lot of anxiety and depression   ?Sleep less sound with less mirtazapine. ? Plan: Due to the severity of symptoms,Add olanzapine 5 mg tablet 1 at night ?Stop mirtazapine ?Reduce risperidone to 1/2 tablet for 1 week then stop it.  Call if SI recurs. ? ?03/30/2021 appointment with the following noted: ?It makes me a little tired but so far anxiety is improved.  No problems off risperidone.  Appetite is not better but did get protein bars and is eating.   ?Less panic.  But still occurs.  Things just started this week to be a little better.  Not huge but it's a start.  A little more energy. ?Sleep 10 hours but that's unchanged with olanzapine. ?First time I've had hope in a long time. ?Plan: A little better with olanzapine 5 mg HS vs risperidone 3 mg. ?Increase olanzapine to 10 mg HS  ?Call in 2 weeks if you are not seeing progressive improvement ? ?04/12/2021  appointment with the following noted: ?Better with anxiety.  Sleep better.  Daytime could nap if not busy.  Otherwise OK.  Not much change in appetite.  Up to 110# about 2.5# better and trying to get protein twice daily.

## 2022-02-03 ENCOUNTER — Other Ambulatory Visit: Payer: Self-pay | Admitting: Psychiatry

## 2022-02-03 ENCOUNTER — Other Ambulatory Visit: Payer: Self-pay | Admitting: Endocrinology

## 2022-02-03 DIAGNOSIS — E638 Other specified nutritional deficiencies: Secondary | ICD-10-CM

## 2022-02-03 DIAGNOSIS — F422 Mixed obsessional thoughts and acts: Secondary | ICD-10-CM

## 2022-02-03 DIAGNOSIS — F332 Major depressive disorder, recurrent severe without psychotic features: Secondary | ICD-10-CM

## 2022-02-03 DIAGNOSIS — F4010 Social phobia, unspecified: Secondary | ICD-10-CM

## 2022-02-11 ENCOUNTER — Other Ambulatory Visit: Payer: Self-pay | Admitting: Psychiatry

## 2022-02-11 DIAGNOSIS — F332 Major depressive disorder, recurrent severe without psychotic features: Secondary | ICD-10-CM | POA: Diagnosis not present

## 2022-02-11 DIAGNOSIS — F422 Mixed obsessional thoughts and acts: Secondary | ICD-10-CM

## 2022-02-13 LAB — CLOMIPRAMINE AND METABOLITE, SERUM
Clomipramine Lvl: 183 ng/mL (ref 70–200)
Norclomipramine,S: 89 ng/mL — ABNORMAL LOW (ref 150–300)
Total (Clo+Norclo): 272 ng/mL (ref 220–500)

## 2022-02-18 NOTE — Progress Notes (Signed)
LVM to RC 

## 2022-02-18 NOTE — Progress Notes (Signed)
She is now on fluvoxamine 150 mg daily and clomipramine 75 mg daily.  It is known that fluvoxamine can affect the blood level of clomipramine but that is not a problem in her case.  We increased the fluvoxamine at her last appointment on January 29, 2022 to the current dosage. ? ?Let her know there is room to increase either fluvoxamine to 200 mg daily which can be split between morning and evening if desired or taken all at night if desired.  Or the other alternative if she prefers is to increase clomipramine to 100 mg at night.  Either 1 of those options may help further decrease anxiety and obsessions.  However if she would prefer to keep the dosage the same that is okay as well. ?Meredith Staggers, MD, DFAPA ? ?

## 2022-02-20 ENCOUNTER — Ambulatory Visit (INDEPENDENT_AMBULATORY_CARE_PROVIDER_SITE_OTHER): Payer: BC Managed Care – PPO | Admitting: Nurse Practitioner

## 2022-02-20 ENCOUNTER — Encounter: Payer: Self-pay | Admitting: Nurse Practitioner

## 2022-02-20 VITALS — BP 112/66 | Ht 64.0 in | Wt 124.0 lb

## 2022-02-20 DIAGNOSIS — Z780 Asymptomatic menopausal state: Secondary | ICD-10-CM | POA: Diagnosis not present

## 2022-02-20 DIAGNOSIS — Z01419 Encounter for gynecological examination (general) (routine) without abnormal findings: Secondary | ICD-10-CM

## 2022-02-20 DIAGNOSIS — M810 Age-related osteoporosis without current pathological fracture: Secondary | ICD-10-CM | POA: Diagnosis not present

## 2022-02-20 NOTE — Progress Notes (Signed)
? ?  Ashlee Chavez 07-05-1961 [id number] ? ? ?History:  61 y.o. G0 presents for annual exam. Postmenopausal - no HRT, no bleeding. 2010 LGSIL. 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 Hypothyroidism and osteoporosis managed by endocrinology. OCD, GAD, MDD managed by psychiatry. She had basal cell removed from nose.  ? ?Gynecologic History ?Patient's last menstrual period was 11/19/2010. ?  ?Contraception/Family planning: post menopausal status ?Sexually active: No ? ?Health Maintenance ?Last Pap: 02/04/2020. Results were: Normal, 3-year repeat ?Last mammogram: 12/2021. Results were: Right breast fibroadenoma ?Last colonoscopy: 2012. Results were: Normal ?Last Dexa: 02/15/2020. Results were: T-score -2.4 ? ?Past medical history, past surgical history, family history and social history were all reviewed and documented in the EPIC chart. Single. [facility].  ? ?ROS:  A ROS was performed and pertinent positives and negatives are included. ? ?Exam: ? ?Vitals:  ? 02/20/22 1517  ?BP: 112/66  ?Weight: 124 lb (56.2 kg)  ?Height: 5\' 4"  (1.626 m)  ? ? ?Body mass index is 21.28 kg/m?. ? ?General appearance:  Normal ?Thyroid:  Symmetrical, normal in size, without palpable masses or nodularity. ?Respiratory ? Auscultation:  Clear without wheezing or rhonchi ?Cardiovascular ? Auscultation:  Regular rate, without rubs, murmurs or gallops ? Edema/varicosities:  Not grossly evident ?Abdominal ? Soft,nontender, without masses, guarding or rebound. ? Liver/spleen:  No organomegaly noted ? Hernia:  None appreciated ? Skin ? Inspection:  Grossly normal ?  ?Breasts: Examined lying and sitting.  ? Right: Without masses, retractions, discharge or axillary adenopathy. ? ? Left: Without masses, retractions, discharge or axillary adenopathy. ?Genitourinary  ? Inguinal/mons:  Normal without inguinal adenopathy ? External genitalia:  Normal appearing vulva with no masses, tenderness, or lesions ? BUS/Urethra/Skene's glands:  Normal ? Vagina:   Normal appearing with normal color and discharge, no lesions. Atrophic changes ? Cervix:  Normal appearing without discharge or lesions ? Uterus:  Normal in size, shape and contour.  Midline and mobile, nontender ? Adnexa/parametria:   ?  Rt: Normal in size, without masses or tenderness. ?  Lt: Normal in size, without masses or tenderness. ? Anus and perineum: Normal ? Digital rectal exam: Normal sphincter tone without palpated masses or tenderness ? ?Patient informed chaperone available to be present for breast and pelvic exam. Patient has requested no chaperone to be present. Patient has been advised what will be completed during breast and pelvic exam.  ? ?Assessment/Plan:  61 y.o. G0 for annual exam.  ? ?Well female exam with routine gynecological exam - Education provided on SBEs, importance of preventative screenings, current guidelines, high calcium diet, regular exercise, and multivitamin daily. Preventative labs done with PCP and endocrinology.  ? ?Age-related osteoporosis without current pathological fracture - Managed by endocrinology. 02/2020 T-score -2.4. Was taken off Actonel. Will schedule DXA now. Taking daily Vitamin D supplement. Recommend increasing exercise/resistance training.  ? ?Screening for cervical cancer - 2010 LGSIL. Will repeat at 3-year interval per guidelines. ? 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 Continue annual screenings.  Normal breast exam today. ? ?Screening for colon cancer - 2012 colonoscopy. Consultation scheduled for 4/20. ? ?Return in 1 year for annual.  ? ? ?5/20 DNP, 3:33 PM 02/20/2022 ? ?

## 2022-02-22 ENCOUNTER — Other Ambulatory Visit: Payer: Self-pay

## 2022-02-22 DIAGNOSIS — F900 Attention-deficit hyperactivity disorder, predominantly inattentive type: Secondary | ICD-10-CM

## 2022-02-22 MED ORDER — METHYLPHENIDATE HCL ER 36 MG PO TB24: 72.0000 mg | ORAL_TABLET | Freq: Every day | ORAL | 0 refills | Status: DC

## 2022-02-22 MED ORDER — METHYLPHENIDATE HCL ER (OSM) 36 MG PO TBCR: 72.0000 mg | EXTENDED_RELEASE_TABLET | Freq: Every day | ORAL | 0 refills | Status: DC

## 2022-03-07 ENCOUNTER — Other Ambulatory Visit: Payer: Self-pay

## 2022-03-07 DIAGNOSIS — R251 Tremor, unspecified: Secondary | ICD-10-CM

## 2022-03-07 MED ORDER — PROPRANOLOL HCL 20 MG PO TABS: ORAL_TABLET | ORAL | 3 refills | Status: DC

## 2022-03-13 ENCOUNTER — Ambulatory Visit (INDEPENDENT_AMBULATORY_CARE_PROVIDER_SITE_OTHER): Payer: BC Managed Care – PPO

## 2022-03-13 ENCOUNTER — Other Ambulatory Visit: Payer: Self-pay | Admitting: Nurse Practitioner

## 2022-03-13 DIAGNOSIS — Z780 Asymptomatic menopausal state: Secondary | ICD-10-CM | POA: Diagnosis not present

## 2022-03-13 DIAGNOSIS — M810 Age-related osteoporosis without current pathological fracture: Secondary | ICD-10-CM | POA: Diagnosis not present

## 2022-03-21 ENCOUNTER — Other Ambulatory Visit: Payer: Self-pay | Admitting: Endocrinology

## 2022-03-21 DIAGNOSIS — E063 Autoimmune thyroiditis: Secondary | ICD-10-CM

## 2022-03-26 ENCOUNTER — Ambulatory Visit (INDEPENDENT_AMBULATORY_CARE_PROVIDER_SITE_OTHER): Payer: BC Managed Care – PPO | Admitting: Psychiatry

## 2022-03-26 DIAGNOSIS — T887XXA Unspecified adverse effect of drug or medicament, initial encounter: Secondary | ICD-10-CM

## 2022-03-26 DIAGNOSIS — F422 Mixed obsessional thoughts and acts: Secondary | ICD-10-CM

## 2022-03-26 DIAGNOSIS — F330 Major depressive disorder, recurrent, mild: Secondary | ICD-10-CM | POA: Diagnosis not present

## 2022-03-26 DIAGNOSIS — F4010 Social phobia, unspecified: Secondary | ICD-10-CM | POA: Diagnosis not present

## 2022-03-26 NOTE — Progress Notes (Signed)
Psychotherapy Progress Note [facility] Ashlee Moore, PhD LP  Patient ID: Ashlee Chavez)    MRN: [id number] Therapy format: Individual psychotherapy Date: 03/26/2022      Start: 4:15p     Stop: 5:00p     Time Spent: 45 min Location: In-person   Session narrative (presenting needs, interim history, self-report of stressors and symptoms, applications of prior therapy, status changes, and interventions made in session) Rarely, and uncharacteristically, overslept this morning.  Did try med experiment with Dr. Clovis Chavez increasing fluvoxamine, but too groggy in morning a couple weeks, re-decided to increase clomipramine instead.  Working with timing, since one evening became inconveniently groggy (took before a baseball game).  Ashlee Chavez was a good sport about it.    Relationship going well, though still bewildering to be liked and complimented the way he does.  Now seeing all day Saturday and one evening a week.  Protests that she has cleaning to do, and he offers to help.  Working to let him vacuum, and come along shopping, and just love her.  Does struggle with the vulnerability of being this attractive to someone, and acknowledged twin fears of disappointing him and being disappointed.  Has let him see her feet, which she regards as hideous.  Knows he is thinking marriage eventually.  Has let him come to church, sees him digging in to learn prayers and church rules, even table grace so he can participate.  Even the cat likes him.  Seem miraculous how kind and safe he is, just struggling sometimes about whether she feels the same as him.  Interpreted as mixed feelings in the moment, just the way her anxiety competes with her honest attraction to him and her honest recognition that he is both kind and authentic.  Encouraged in continuing to develop a relationship and take chances letting him do and see things.  Organically, getting good, solid sleep, well rested.  Though probably  still rebuilding from prolonged depression, attention and concentration are good, and intrusive worries about making mistakes are very much quieted, enough to generally just blow off.  Will pray for clarity and accuracy but nothing excessive.  Nutritionally, getting significant fruit, veg, protein, including high protein Boost in the morning.  Taking vitamin D, C + Mg, B complex.  Advise worth adding omega 3 for added antiinflammatory benefit.  Therapeutic modalities: Cognitive Behavioral Therapy and Solution-Oriented/Positive Psychology  Mental Status/Observations:  Appearance:   Casual     Behavior:  Appropriate  Motor:  Normal and mild facial tremor  Speech/Language:   Clear and Coherent  Affect:  Appropriate  Mood:  anxious and continuing freer  Thought process:  normal  Thought content:    Obsessions  Sensory/Perceptual disturbances:    WNL  Orientation:  Fully oriented  Attention:  Good    Concentration:  Good  Memory:  WNL  Insight:    Good  Judgment:   Good  Impulse Control:  Good   Risk Assessment: Danger to Self: No Self-injurious Behavior: No Danger to Others: No Physical Aggression / Violence: No Duty to Warn: No Access to Firearms a concern: No  Assessment of progress:  progressing  Diagnosis:   ICD-10-CM   1. Social anxiety disorder  F40.10     2. Mixed obsessional thoughts and acts  F42.2     3. 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 Medication side effects  T88.7XXA      Plan:  Maintain vitamin  supplements, add omega-3 for anti-inflammatory benefits Work with timing on medication adjustments to maximize rest at night and alertness by day For social anxiety and OCD attached to relationship, experiment further in letting [name] do things at the house, accompany her to things, and repeat and progress in letting him see feet and other aspects of her body which may be ego dystonic to her.  Practice letting it be strange but true that she commends  the attention and interest of a suitable man Although worries about making mistakes on the job are much lower did now, look for opportunities to challenge her worrying mind by coming up with mistakes that she can either imagine or "fake", and in any case look for a way to laugh about her own mind and intrusive worries Other recommendations/advice as may be noted above Continue to utilize previously learned skills ad lib Maintain medication as prescribed and work faithfully with relevant prescriber(s) if any changes are desired or seem indicated Call the clinic on-call service, 988/hotline, 911, or present to [facility] or ER if any life-threatening psychiatric crisis Return in about 6 weeks (around 05/07/2022). Already scheduled visit in this office 05/01/2022.  Ashlee Serve, PhD Ashlee Moore, PhD LP Clinical Psychologist, [facility] [address] [place], [place] [address] [phone]

## 2022-04-15 DIAGNOSIS — G43719 Chronic migraine without aura, intractable, without status migrainosus: Secondary | ICD-10-CM | POA: Diagnosis not present

## 2022-04-26 ENCOUNTER — Other Ambulatory Visit: Payer: Self-pay | Admitting: Psychiatry

## 2022-04-26 DIAGNOSIS — F422 Mixed obsessional thoughts and acts: Secondary | ICD-10-CM

## 2022-04-26 DIAGNOSIS — F332 Major depressive disorder, recurrent severe without psychotic features: Secondary | ICD-10-CM

## 2022-04-27 ENCOUNTER — Other Ambulatory Visit: Payer: Self-pay | Admitting: Psychiatry

## 2022-04-29 ENCOUNTER — Other Ambulatory Visit: Payer: Self-pay | Admitting: Psychiatry

## 2022-04-29 ENCOUNTER — Other Ambulatory Visit: Payer: Self-pay

## 2022-04-29 DIAGNOSIS — F4010 Social phobia, unspecified: Secondary | ICD-10-CM

## 2022-04-29 DIAGNOSIS — E638 Other specified nutritional deficiencies: Secondary | ICD-10-CM

## 2022-04-29 DIAGNOSIS — F332 Major depressive disorder, recurrent severe without psychotic features: Secondary | ICD-10-CM

## 2022-04-29 DIAGNOSIS — F422 Mixed obsessional thoughts and acts: Secondary | ICD-10-CM

## 2022-04-29 DIAGNOSIS — F900 Attention-deficit hyperactivity disorder, predominantly inattentive type: Secondary | ICD-10-CM

## 2022-04-30 MED ORDER — METHYLPHENIDATE HCL ER (OSM) 36 MG PO TBCR: 72.0000 mg | EXTENDED_RELEASE_TABLET | Freq: Every day | ORAL | 0 refills | Status: DC

## 2022-04-30 MED ORDER — METHYLPHENIDATE HCL ER 36 MG PO TB24: 72.0000 mg | ORAL_TABLET | Freq: Every day | ORAL | 0 refills | Status: DC

## 2022-05-01 ENCOUNTER — Ambulatory Visit (INDEPENDENT_AMBULATORY_CARE_PROVIDER_SITE_OTHER): Payer: BC Managed Care – PPO | Admitting: Psychiatry

## 2022-05-01 ENCOUNTER — Encounter: Payer: Self-pay | Admitting: Psychiatry

## 2022-05-01 DIAGNOSIS — E638 Other specified nutritional deficiencies: Secondary | ICD-10-CM

## 2022-05-01 DIAGNOSIS — R251 Tremor, unspecified: Secondary | ICD-10-CM

## 2022-05-01 DIAGNOSIS — F332 Major depressive disorder, recurrent severe without psychotic features: Secondary | ICD-10-CM

## 2022-05-01 DIAGNOSIS — F4010 Social phobia, unspecified: Secondary | ICD-10-CM

## 2022-05-01 DIAGNOSIS — F422 Mixed obsessional thoughts and acts: Secondary | ICD-10-CM

## 2022-05-01 MED ORDER — FLUVOXAMINE MALEATE 50 MG PO TABS: 50.0000 mg | ORAL_TABLET | Freq: Two times a day (BID) | ORAL | 1 refills | Status: DC

## 2022-05-01 MED ORDER — OLANZAPINE 10 MG PO TABS: 10.0000 mg | ORAL_TABLET | Freq: Every day | ORAL | 1 refills | Status: DC

## 2022-05-01 MED ORDER — PROPRANOLOL HCL 20 MG PO TABS: ORAL_TABLET | ORAL | 3 refills | Status: DC

## 2022-05-01 MED ORDER — CLOMIPRAMINE HCL 75 MG PO CAPS: 75.0000 mg | ORAL_CAPSULE | Freq: Every day | ORAL | 1 refills | Status: DC

## 2022-05-01 NOTE — Progress Notes (Signed)
TEDDI BADALAMENTI [id number] Mar 16, 1961 61 y.o.  Subjective:   Patient ID:  Ashlee Chavez is a 61 y.o. (DOB 02-Dec-1960) female.  Chief Complaint:  Chief Complaint  Patient presents with   Follow-up   Depression   Anxiety    Depression        Associated symptoms include headaches.  Associated symptoms include no decreased concentration, no appetite change and no suicidal ideas.  Past medical history includes anxiety.   Anxiety Patient reports no confusion, decreased concentration, dizziness, palpitations or suicidal ideas.     [name] presents today for follow-up of recently worse depression DT relationship loss.    When seen February 10, 2019.  She had received some benefit from Ritalin 20 mg 3 times daily but was having issues with the crash.  We switched to Concerta 72 mg every morning.  September 14, 2019.  She was having persistent bothersome OCD and was interested in further medicine changes to try to help with these symptoms because she has aggressively pursued therapy and still has significant symptoms.  She has been on multiple SSRIs and so we initiated augmentation with risperidone half milligram daily to increase to 1 mg daily. Sig improvement with risperidone.  SE low appetite with it.  Not hungry for dinner. Sleep so much better also.  Improvement is "marked".  Think better bc less obsessing time.  Not constant now.  Noticed benefit within the first week.  Less obsessing lessened depression.  But residual symptoms.  visit October 14, 2019.  She was encouraged to experiment with the dosing of the risperidone up to 2 mg or 3 mg nightly to see if she could get additional reduction in her chronic anxiety and OCD.   seen December 15, 2019.  Settled at 2 mg risperidone bc increased sleep.  Sleep is so much better.  Harder to get up in the morning.   Found out insurance won't cover Concerta anymore.  Not as good on Ritalin TID.  Not as even a response.  Terrible  insurance. Therefore the only med change with switching to Focalin to see if it would be covered better.  Appt 6/43/32, switch to Concerta 72 mg is better with better mood and motivation vs Focalin.  Focus is also better.  Very poor function with Focalin and better with Concerta.   Better overall with more even response to stimulant and better appetite.  No SE.  Duration all day.  Less desperate hopeless feelings on the stimulant.  Stressful times. With increase risperidone a little better mood bump.  SE a little shakiness noticed in jaw.   Not unusual levels of anxiety.  Lost her best friend 1 year ago and still grieving.  Not much family. Alone.  Not close to B and S who live elsewhere.  Talks to God daily.  Gets to work daily.  Function is good there.  Reads and calls people.  OCD no worse with Covid. Did join a Bible study which was a huge step. Plan start lamotrigine   04/04/20 appt with the following noted: Appt moved up.  She thought she was supposed to stop risperidone when started lamotrigine.  Started having SI off the risperidone but wasn't sure if it was SE.  No risperidone 3 mg for 3 weeks.Anxiety shaking sweating crying all started right away.  Also trouble with sleep and can't eat.  Plan:Restart risperidone 1 mg now and another milligram in 2 to 3 hours if not feeling sufficient relief to get  the full dose of 3 mg today.  Then resume 3 mg risperidone each evening. Restart lamotrigine 25 mg daily for 2 weeks and follow the previous instructions on increasing it.  05/11/2020 appointment the following is noted: Marked improvement back on risperidone 3 mg.  SE shakes jaw.  Doesn't affect typing. Started lamotrigine and up to 100 mg for 2 weeks.  Feels it's helped depression. Better mood and able to laugh.  Able to get pizza for Bible study and not cry. Apptetite is not great.  Working on it. Anxiety is better not gone. Plan: no med changes  07/26/20 appt with the following  noted; Increased lamotrigine gradually to 200 mg daily over the phone 6 weeks ago.  No improvement really for depression. With it. CC depression.   Risperidone still helping anxiety. Plan: Increase lamotrigine to 300 mg daily..  09/14/2020 appointment with following noted: Started taper of lamotrigine DT NR. Started latuda 20 mg daily for 10 days.  Tolerated fine.  No effects so far.. Main problem is still depression.  Anxiety is not too bad.  No energy, motivation, sleeeping a lot.  No sig SI. In past started risperidone for anxiety and depression.  Increase Latuda to 40 mg daily. Disc potential DDI with risperidone reducing effectiveness of  Latuda so reduc risperidone to 1.5 mg HS  10/09/2020 phone call from patient: Patient left a voicemail on Friday while we were out of office for the Holiday that she is not any better. She feels the cutting in the 1/2 of Risperidone has really increased her anxiety. She was instructed to call back if not any better but concerned that taking away the Risperidone will really exacerbate her anxiety.  MD response:  At the last appointment we increased Latuda to 40 mg daily and reduced risperidone to half tablet daily.  The goal was to use Latuda and eliminate risperidone.  However since her anxiety has worsened with the reduction in risperidone, I agree with her and she should take the full dose of risperidone as well as continue the Latuda 40 mg daily.  The [name] is intended to help depression.  11/23/2020 appointment with the following noted: Propranolol only once daily. No better with higher Latuda.  No SE either. No change in appetite which is poor.  Still struggling with depression and OCD is a little worse with changes at work. Off lamotrigine. Plan: 5-HTP supplement 200 mg daily  Increase Latuda to 60 mg  for 3 weeks, If no benefit and no SE increase to 80 mg daily with food.  ADD still better with Concerta 72 mg versus Focalin. Continue high  dosage paroxetine 80 mg daily.  01/16/2021 appointment with following noted: Increase Latuda to 80 mg without benefit or SE.  Anxiety and depression with poor STM.  Food getting caught in her throat.  No change in eating.  No change in weight.   Anxious thoughts mostly about work even when not there.  Anxious about work needed on the house she can't afford.  Enjoys reading but feels guilty that she should be doing something else.  More general anxiety than OCD. 5-HTP supplement 200 mg daily Wean Latuda over a week DT NR Reduce mirtazapine to 30 mg HS  ADD still better with Concerta 72 mg versus Focalin. Start clomipramine 25 mg capsules 1 at night for 1 week, Then increase clomipramine to 2 capsules at night and reduce paroxetine to 1-1/2 tablets or 60 mg daily. Wait 1 week and then get the  blood test   03/15/2021 appt noted: OK not great.  About the same.  Not much change in the last 8 weeks. SE none Lab 02/20/21 clomipramine 50 + Luvox 100 = clomip 195 + norclomip 111= total 306 Losing weight and no appetite and hard to eat. Weight loss to 107#. A lot of anxiety and depression   Sleep less sound with less mirtazapine.  Plan: Due to the severity of symptoms,Add olanzapine 5 mg tablet 1 at night Stop mirtazapine Reduce risperidone to 1/2 tablet for 1 week then stop it.  Call if SI recurs.  03/30/2021 appointment with the following noted: It makes me a little tired but so far anxiety is improved.  No problems off risperidone.  Appetite is not better but did get protein bars and is eating.   Less panic.  But still occurs.  Things just started this week to be a little better.  Not huge but it's a start.  A little more energy. Sleep 10 hours but that's unchanged with olanzapine. First time I've had hope in a long time. Plan: A little better with olanzapine 5 mg HS vs risperidone 3 mg. Increase olanzapine to 10 mg HS  Call in 2 weeks if you are not seeing progressive improvement  04/12/2021  appointment with the following noted: Better with anxiety.  Sleep better.  Daytime could nap if not busy.  Otherwise OK.  Not much change in appetite.  Up to 110# about 2.5# better and trying to get protein twice daily.   A little change in OCD.   Depression is better some and staying up later and doing more than she was.  Doing some things after dinner which is huge.  Big change with depression.  Not retreating to bed as quickly. Plan: continue meds.  05/18/21 appt noted: Further improvement in anxiety.  I feel better but still not going out but not excessively sleeping.  Acting more normally, eating better.  Eating dinner again.  Sleep reduced to 8 hours. SE dry mouth and a little hungrier. Asks about going up on olanzapine to further reduce anxiety. [name] and describes better from 10/10 to 4/10 for anxiety and depression down to 3/10.Marland Kitchen  Anxiety symptoms include: Excessive Worry, Obsessive Compulsive Symptoms:   Handwashing,,.  Pt reports no sleep issues. Pt reports that appetite is good. Pt reports that energy is good and down slightly. Concentration is no change. Suicidal thoughts:  denied by patient. Plan increase olanzapine to 15 mg HS DT partial benefit  06/22/21 appt noted: Too tired and sleepy with 15 mg olanzapine and reduced the dose to 10 mg daily. No other med changes. Overall about the same.  Don't do anything different.  Happy that it's better than it was stays up to 930 until 6 PM.  Don't go out except Bible study and church once weekly. Goes to grocery early to avoid people. Anxiety lately worse than the depression. Dx pulsatile tinnitus Plan: cut olanzapine back to 10 mg HS Increase clomipramine to 75 mg HS  08/13/2021 appointment with the following noted: Anxiety is "good" after increase clomipramine.  Used to be desperate to get home and not now.  Was thinking of disability and now it's better.  I texted a guy on a dating site first  time in 2 years.  It's a big deal.  Started exercising a little.   SE no worse with increase clomipramine. Depression is better too and manageable with brief downturns.  Sleep is pretty good. Plan no med changes  10/30/2021 appt noted: Pretty good and a little better than last time.  Still getting out a little with a guy she met on dating site and it's going ok.  Still prefers to be home.  Still get anxious going out. Overall best response so far with any med for anxiety.   Depression is manageable. SE is OK Staying up more appropriately late and sleep is OK. Plan : Much better with clomipramine 75 HS Also continue olanzapine 10 HS & fluvoxamine 50 Benefit Concerta 72 mg a.m. Taking propranolol 20 BID for tremor No med changes  01/29/2022 appointment with the following noted: Pretty good and probably doing better. Handled triggers with less obsessions.  Had to have bx breast today and hasn't been obsessing like she would ususally.  Better than she ever expected. Surprising. Feels the med has hit it's stride at this point. No sig caffeine.  Tremor managed. Depresion managed. Concerta is fine without problems.  Not noticeable when it wears off. Sleep good lately. Yes increase fluvoxamine to 100 mg HS and 50 mg AM Repeat clomipramine level .    02/11/22 lab noted: She is now on fluvoxamine 150 mg daily and clomipramine 75 mg daily.  It is known that fluvoxamine can affect the blood level of clomipramine but that is not a problem in her case.  We increased the fluvoxamine at her last appointment on January 29, 2022 to the current dosage.  Let her know there is room to increase either fluvoxamine to 200 mg daily which can be split between morning and evening if desired or taken all at night if desired.  Or the other alternative if she prefers is to increase clomipramine to 100 mg at night.  Either 1 of those options may help further decrease anxiety and obsessions.  However if she would prefer to  keep the dosage the same that is okay as well. Lynder Parents, MD, DFAPA  04/30/22 appt noted: Tried increasing clomipramine and then fluvoxamine and it made her too sleepy. Depression managed. Still a little OCD and anxiety but pretty much managed unless triggers. Getting out socially and dating which causes anxiety but doing OK. Tolerating meds ok now. Sleep 9-5.  Likes to nap but not usually. No SI in a long time.  Numerous failed psychiatric medication trials include Lexapro 60 mg a day, Zoloft 250, Paxil with weight gain,  Fluvoxamine 100 Clomipramine 50,  Wellbutrin 300 with side effects,  mirtazapine Rexulti which helped initially,  Vraylar, Abilify between 1 and 2.5 mg and up to 15 mg with no response,  Latuda 80 failed Risperidone Olanzapine 15 sedation. Lamotrigine 300 NR buspirone 30 mg,  Cytomel,  lithium 625,  pramipexole with insomnia,   Lunesta with crying spells,  topiramate, Deplin, propranolol,   Ritalin, Focalin NR, Concerta 72 helped No history of Prozac, Viibryd.  Review of Systems:  Review of Systems  Constitutional:  Negative for appetite change, diaphoresis and unexpected weight change.  HENT:  Positive for tinnitus.   Cardiovascular:  Negative for palpitations.  Neurological:  Positive for tremors and headaches. Negative for dizziness, weakness and light-headedness.       Mouth  Psychiatric/Behavioral:  Negative for agitation, behavioral problems, confusion, decreased concentration, hallucinations, self-injury, sleep disturbance and suicidal ideas. The patient is not hyperactive.     Medications: I have reviewed the patient's current medications.  Current Outpatient Medications  Medication Sig Dispense Refill   ALPRAZolam (XANAX) 0.25 MG tablet  Take 1 tablet (0.25 mg total) by mouth daily as needed for anxiety. 30 tablet 0   atorvastatin (LIPITOR) 20 MG tablet TAKE 1 TABLET BY MOUTH EVERY DAY 90 tablet 1   Calcium Carbonate-Vitamin D (OSCAL 500/200  D-3 PO) Take by mouth.     Cholecalciferol (VITAMIN D PO) Take by mouth.     clomiPRAMINE (ANAFRANIL) 75 MG capsule Take 1 capsule (75 mg total) by mouth at bedtime. 90 capsule 1   fluvoxaMINE (LUVOX) 50 MG tablet Take 1 tablet (50 mg total) by mouth 2 (two) times daily. 180 tablet 1   levothyroxine (SYNTHROID) 100 MCG tablet TAKE 1 TABLET BY MOUTH EVERY DAY 90 tablet 1   [START ON 06/27/2022] methylphenidate 36 MG PO CR tablet Take 2 tablets (72 mg total) by mouth daily. 60 tablet 0   [START ON 05/29/2022] methylphenidate 36 MG PO CR tablet Take 2 tablets (72 mg total) by mouth daily. 60 tablet 0   methylphenidate 36 MG PO CR tablet Take 2 tablets (72 mg total) by mouth daily. 60 tablet 0   SUMAtriptan (IMITREX) 100 MG tablet Take 100 mg by mouth daily as needed for migraine.     topiramate (TOPAMAX) 100 MG tablet TAKE 1 TABLET BY MOUTH TWICE A DAY 180 tablet 0   OLANZapine (ZYPREXA) 10 MG tablet Take 1 tablet (10 mg total) by mouth at bedtime. 90 tablet 1   propranolol (INDERAL) 20 MG tablet Take 1-2 tablets by mouth twice a day as needed for tremors 100 tablet 3   No current facility-administered medications for this visit.    Medication Side Effects:  Watch jaw tremor  Allergies:  Allergies  Allergen Reactions   Ampicillin Rash    Past Medical History:  Diagnosis Date   Gum lesion    Biopsy done-benign   Hypercholesteremia    Hypothyroid    LGSIL (low grade squamous intraepithelial dysplasia) 2010   Migraines    OCD (obsessive compulsive disorder)    Osteoporosis 01/2016   T score -2.7    Family History  Problem Relation Age of Onset   Hypertension Mother    Heart disease Mother    Hypertension Father    Breast cancer Paternal Aunt        Age 4   Diabetes Neg Hx     Social History   Socioeconomic History   Marital status: Single    Spouse name: Not on file   Number of children: Not on file   Years of education: Not on file   Highest education level: Not on file   Occupational History   Not on file  Tobacco Use   Smoking status: Never   Smokeless tobacco: Never  Vaping Use   Vaping Use: Never used  Substance and Sexual Activity   Alcohol use: Yes    Alcohol/week: 0.0 standard drinks of alcohol    Comment: wine occassionally   Drug use: No   Sexual activity: Not Currently    Birth control/protection: Post-menopausal    Comment: 1st intercourse 73 yo-5 partners  Other Topics Concern   Not on file  Social History Narrative   Not on file   Social Determinants of Health   Financial Resource Strain: Not on file  Food Insecurity: Not on file  Transportation Needs: Not on file  Physical Activity: Not on file  Stress: Not on file  Social Connections: Not on file  Intimate Partner Violence: Not on file    Past Medical History, Surgical history,  Social history, and Family history were reviewed and updated as appropriate.   Please see review of systems for further details on the patient's review from today.   Objective:   Physical Exam:  LMP 11/19/2010   Physical Exam Constitutional:      General: She is not in acute distress.    Appearance: Normal appearance. She is well-developed.  Musculoskeletal:        General: No deformity.  Neurological:     Mental Status: She is alert and oriented to person, place, and time.     Motor: Tremor present.     Coordination: Coordination normal.     Comments: Mild mouth tremor  Psychiatric:        Attention and Perception: She is attentive. She does not perceive auditory hallucinations.        Mood and Affect: Mood is anxious. Mood is not depressed. Affect is not labile, blunt, angry or inappropriate.        Speech: Speech is not delayed.        Behavior: Behavior is not slowed.        Thought Content: Thought content is not paranoid or delusional. Thought content does not include homicidal or suicidal ideation. Thought content does not include suicidal plan.        Cognition and Memory:  Cognition normal.        Judgment: Judgment normal.     Comments: Insight intact. No auditory or visual hallucinations. No delusions. Less checking. Gradually improving OCD Severe sx fimproved with both anxiety and depression.  Clomiprmaine helped.  Not slowed.     Lab Review:     Component Value Date/Time   NA 143 01/01/2022 0809   K 4.6 01/01/2022 0809   CL 108 01/01/2022 0809   CO2 31 01/01/2022 0809   GLUCOSE 87 01/01/2022 0809   BUN 24 (H) 01/01/2022 0809   CREATININE 0.85 01/01/2022 0809   CALCIUM 9.8 01/01/2022 0809   PROT 6.3 01/01/2022 0809   ALBUMIN 4.1 01/01/2022 0809   AST 42 (H) 01/01/2022 0809   ALT 50 (H) 01/01/2022 0809   ALKPHOS 57 01/01/2022 0809   BILITOT 0.3 01/01/2022 0809   GFRNONAA >60 11/20/2021 0703       Component Value Date/Time   WBC 6.9 11/20/2021 0703   RBC 4.72 11/20/2021 0703   HGB 15.4 (H) 11/20/2021 0703   HCT 46.3 (H) 11/20/2021 0703   PLT 156 11/20/2021 0703   MCV 98.1 11/20/2021 0703   MCH 32.6 11/20/2021 0703   MCHC 33.3 11/20/2021 0703   RDW 13.1 11/20/2021 0703   LYMPHSABS 1.0 11/20/2021 0703   MONOABS 0.4 11/20/2021 0703   EOSABS 0.1 11/20/2021 0703   BASOSABS 0.0 11/20/2021 0703    No results found for: "POCLITH", "LITHIUM"   No results found for: "PHENYTOIN", "PHENOBARB", "VALPROATE", "CBMZ"   .res Assessment: Plan:    Vernida was seen today for follow-up, depression and anxiety.  Diagnoses and all orders for this visit:  Mixed obsessional thoughts and acts -     fluvoxaMINE (LUVOX) 50 MG tablet; Take 1 tablet (50 mg total) by mouth 2 (two) times daily. -     clomiPRAMINE (ANAFRANIL) 75 MG capsule; Take 1 capsule (75 mg total) by mouth at bedtime. -     OLANZapine (ZYPREXA) 10 MG tablet; Take 1 tablet (10 mg total) by mouth at bedtime.  Social anxiety disorder -     fluvoxaMINE (LUVOX) 50 MG tablet; Take 1 tablet (50 mg total)  by mouth 2 (two) times daily. -     clomiPRAMINE (ANAFRANIL) 75 MG capsule; Take 1  capsule (75 mg total) by mouth at bedtime. -     OLANZapine (ZYPREXA) 10 MG tablet; Take 1 tablet (10 mg total) by mouth at bedtime.  Tremor of face and hands -     propranolol (INDERAL) 20 MG tablet; Take 1-2 tablets by mouth twice a day as needed for tremors  Major depressive disorder, recurrent severe without psychotic features ([place]) -     OLANZapine (ZYPREXA) 10 MG tablet; Take 1 tablet (10 mg total) by mouth at bedtime.  Imbalanced nutrition -     OLANZapine (ZYPREXA) 10 MG tablet; Take 1 tablet (10 mg total) by mouth at bedtime.   We discussed TRD and anxiety . she has a long list of failed psychiatric meds.  She has not tended to do well with higher dosages of SSRIs.  Patient has been markedly depressed and anxious but, much better Since adding olanzapine 10 mg she has seen a significant improvement in depression and anxiety. Much better with clomipramine 75 HS  Overall she is satisfied with the current med regimen  Chart review revealed that prior to starting treatment here patient had taken clomipramine in the past with some benefit but also with some side effects.  It is a tricyclic antidepressant distinctly different from SSRIs.  Some patients respond better to them.  She has a history of previous benefit.  It makes sense to retry that medication.  We discussed the value of doing blood levels with tricyclic antidepressants like clomipramine.  She agrees.  Lab 02/20/21 clomipramine 50 + Luvox 100 = clomip 195 + norclomip 111= total 306 (normal 220-500) Better with Increase clomipramine to 75 mg HS 02/11/2022 clomipramine level 183 history people, nor clomipramine level 89, total 272 on clomipramine 75 mg nightly and fluvoxamine 50 mg twice daily.  Disc dental risks and SE risks with meds.  Could not tolerate fluvoxamine 100 or clomipramine 100 DT sleepiness Therefore continue fluvoxamine 50 mg twice daily and clomipramine 75 mg nightly Continue olanzapine 10 mg nightly ADD still  better with Concerta 72 mg versus Focalin. 5-HTP supplement 200 mg daily No med changes today . Marland Kitchen She is continuing psychotherapy with Dr. Rica Mote  Discussed potential metabolic side effects associated with atypical antipsychotics, as well as potential risk for movement side effects. Advised pt to contact office if movement side effects occur.  She agrees with the plan  Disc dx pulsatile tinnitus and possible treatment of gabapentin.    Mouth tremor appears unchanged since the switch from risperidone to olanzapine.  We will continue to monitor.  Follow-up in 3 mos  Lynder Parents MD, DFAPA  Future Appointments  Date Time Provider [place]  05/17/2022  4:00 PM Blanchie Serve, PhD [facility] None  07/02/2022  8:15 AM [facility] None  07/04/2022  8:15 AM Elayne Snare, MD [facility] None  10/01/2022  4:30 PM Cottle, Billey Co., MD [facility] None  02/24/2023  3:00 PM Marny Lowenstein A, NP [facility] None    No orders of the defined types were placed in this encounter.      -------------------------------

## 2022-05-17 ENCOUNTER — Ambulatory Visit (INDEPENDENT_AMBULATORY_CARE_PROVIDER_SITE_OTHER): Payer: BC Managed Care – PPO | Admitting: Psychiatry

## 2022-05-17 DIAGNOSIS — F4010 Social phobia, unspecified: Secondary | ICD-10-CM | POA: Diagnosis not present

## 2022-05-17 DIAGNOSIS — R251 Tremor, unspecified: Secondary | ICD-10-CM

## 2022-05-17 DIAGNOSIS — F422 Mixed obsessional thoughts and acts: Secondary | ICD-10-CM

## 2022-05-17 DIAGNOSIS — F331 Major depressive disorder, recurrent, moderate: Secondary | ICD-10-CM

## 2022-05-17 DIAGNOSIS — E638 Other specified nutritional deficiencies: Secondary | ICD-10-CM

## 2022-05-17 DIAGNOSIS — T887XXA Unspecified adverse effect of drug or medicament, initial encounter: Secondary | ICD-10-CM

## 2022-05-17 NOTE — Progress Notes (Signed)
Psychotherapy Progress Note [facility] [name], PhD LP  Patient ID: REETA KUK)    MRN: [id number] Therapy format: Individual psychotherapy Date: 05/17/2022      Start: 4:20p     Stop: 5:05p     Time Spent: 45 min Location: In-person   Session narrative (presenting needs, interim history, self-report of stressors and symptoms, applications of prior therapy, status changes, and interventions made in session) Doing OK.  Fighting some fatigue lately, probably attributable to meds, sounds ready to reduce dosage, will work with Dr. Jennelle Human.  Nov 21 scheduled, so rec'd leave message for him asking whether it's OK to reduce ahead of time, see sooner, or really should wait.  Reminded it's always OK to inquire, not tell herself she should wait and not be a bother, as has been her pattern for years.  Work stable, still the feast/famine pattern to her duties, with payroll taking up large amounts of time every other week.  Continued to encourage either clarifying ground rules or equipping herself with something of interest to do in slow weeks.  Cat has needed some frequent vet care.  [name] has been very helpful, both with logistics and with covering the bill.  A bit uncomfortable with his generosity, but still does like and miss him.  Wrestling with whether she is suited to him and whether it's OK to see him and like him when she has her insecurities and problems.  Encouraged to recognize that he is in charge of his own likes and dislikes, she is one of his likes, and she has let him know enough so far that his opinion of her is informed, not delusional.  Encourage let it be OK that she's liked again, and cope with the underlying insecurity that she can't control whether his opinion changes but let it be possible that she is that likable, actually.  Therapeutic modalities: Cognitive Behavioral Therapy and Solution-Oriented/Positive Psychology  Mental  Status/Observations:  Appearance:   Casual and Neat     Behavior:  Appropriate  Motor:  Visible lip tremor,   Speech/Language:   Clear and Coherent  Affect:  Appropriate  Mood:  anxious  Thought process:  Somewhat hesitant, slowed  Thought content:    WNL and Obsessions  Sensory/Perceptual disturbances:    WNL  Orientation:  Fully oriented  Attention:  Good    Concentration:  Good  Memory:  WNL  Insight:    Good  Judgment:   Good  Impulse Control:  Good   Risk Assessment: Danger to Self: No Self-injurious Behavior: No Danger to Others: No Physical Aggression / Violence: No Duty to Warn: No Access to Firearms a concern: No  Assessment of progress:  stabilized  Diagnosis:   ICD-10-CM   1. Social anxiety disorder  F40.10     2. Mixed obsessional thoughts and acts  F42.2     3. Major depressive disorder, recurrent episode, moderate (HCC)  F33.1     4. Tremor of face and hands  R25.1     5. Imbalanced nutrition  E63.8     6. Medication side effects  T88.7XXA      Plan:  Address drug-induced sleepiness with Dr. Jennelle Human.  Noted lip tremor and psychomotor retardation, along with reported fatigue.  Could signal nutritional or medication-related issue. Maintain vitamin supplements, add omega-3 for anti-inflammatory benefits Work with timing on medication adjustments to maximize rest at night and alertness by day For social anxiety and OCD attached to relationship, experiment  further in letting [name] do things at the house, accompany her to things, and repeat and progress in letting him see feet and other aspects of her body which may be ego dystonic to her.  Practice letting it be strange but true that she commends the attention and interest of a suitable man Although worries about making mistakes on the job are much lower did now, look for opportunities to challenge her worrying mind by coming up with mistakes that she can either imagine or "fake", and in any case look for a way  to laugh about her own mind and intrusive worries Recommend asking boss -- at interest -- for additional duties in slow weeks to make job more fulfilling and ego-syntonic Other recommendations/advice as may be noted above Continue to utilize previously learned skills ad lib Maintain medication as prescribed and work faithfully with relevant prescriber(s) if any changes are desired or seem indicated Call the clinic on-call service, 988/hotline, 911, or present to [facility] or ER if any life-threatening psychiatric crisis Return for time at discretion. Already scheduled visit in this office 10/01/2022.  Robley Fries, PhD [name], PhD LP Clinical Psychologist, [facility] [address], [address] [place], [place] [address] [phone]

## 2022-06-03 DIAGNOSIS — Z08 Encounter for follow-up examination after completed treatment for malignant neoplasm: Secondary | ICD-10-CM | POA: Diagnosis not present

## 2022-06-03 DIAGNOSIS — L814 Other melanin hyperpigmentation: Secondary | ICD-10-CM | POA: Diagnosis not present

## 2022-06-03 DIAGNOSIS — D1801 Hemangioma of skin and subcutaneous tissue: Secondary | ICD-10-CM | POA: Diagnosis not present

## 2022-06-03 DIAGNOSIS — L821 Other seborrheic keratosis: Secondary | ICD-10-CM | POA: Diagnosis not present

## 2022-06-14 DIAGNOSIS — Z0000 Encounter for general adult medical examination without abnormal findings: Secondary | ICD-10-CM | POA: Diagnosis not present

## 2022-06-14 DIAGNOSIS — E7800 Pure hypercholesterolemia, unspecified: Secondary | ICD-10-CM | POA: Diagnosis not present

## 2022-06-15 ENCOUNTER — Other Ambulatory Visit: Payer: Self-pay | Admitting: Psychiatry

## 2022-06-28 DIAGNOSIS — K648 Other hemorrhoids: Secondary | ICD-10-CM | POA: Diagnosis not present

## 2022-06-28 DIAGNOSIS — Z1211 Encounter for screening for malignant neoplasm of colon: Secondary | ICD-10-CM | POA: Diagnosis not present

## 2022-07-02 ENCOUNTER — Other Ambulatory Visit (INDEPENDENT_AMBULATORY_CARE_PROVIDER_SITE_OTHER): Payer: BC Managed Care – PPO

## 2022-07-02 DIAGNOSIS — E063 Autoimmune thyroiditis: Secondary | ICD-10-CM

## 2022-07-02 DIAGNOSIS — R7301 Impaired fasting glucose: Secondary | ICD-10-CM

## 2022-07-02 DIAGNOSIS — E559 Vitamin D deficiency, unspecified: Secondary | ICD-10-CM | POA: Diagnosis not present

## 2022-07-02 DIAGNOSIS — E7800 Pure hypercholesterolemia, unspecified: Secondary | ICD-10-CM

## 2022-07-02 LAB — COMPREHENSIVE METABOLIC PANEL
ALT: 20 U/L (ref 0–35)
AST: 22 U/L (ref 0–37)
Albumin: 4.2 g/dL (ref 3.5–5.2)
Alkaline Phosphatase: 56 U/L (ref 39–117)
BUN: 22 mg/dL (ref 6–23)
CO2: 24 mEq/L (ref 19–32)
Calcium: 9.7 mg/dL (ref 8.4–10.5)
Chloride: 107 mEq/L (ref 96–112)
Creatinine, Ser: 0.85 mg/dL (ref 0.40–1.20)
GFR: 73.28 mL/min (ref >60.00)
Glucose, Bld: 91 mg/dL (ref 70–99)
Potassium: 3.9 mEq/L (ref 3.5–5.1)
Sodium: 141 mEq/L (ref 135–145)
Total Bilirubin: 0.2 mg/dL (ref 0.2–1.2)
Total Protein: 6.9 g/dL (ref 6.0–8.3)

## 2022-07-02 LAB — TSH: TSH: 5.92 u[IU]/mL — ABNORMAL HIGH (ref 0.35–5.50)

## 2022-07-02 LAB — T4, FREE: Free T4: 0.84 ng/dL (ref 0.60–1.60)

## 2022-07-02 LAB — VITAMIN D 25 HYDROXY (VIT D DEFICIENCY, FRACTURES): VITD: 40.63 ng/mL (ref 30.00–100.00)

## 2022-07-02 LAB — HEMOGLOBIN A1C: Hgb A1c MFr Bld: 6.3 % (ref 4.6–6.5)

## 2022-07-04 ENCOUNTER — Ambulatory Visit: Payer: BC Managed Care – PPO | Admitting: Endocrinology

## 2022-07-04 ENCOUNTER — Encounter: Payer: Self-pay | Admitting: Endocrinology

## 2022-07-04 VITALS — BP 112/70 | HR 79 | Ht 63.0 in | Wt 120.0 lb

## 2022-07-04 DIAGNOSIS — M810 Age-related osteoporosis without current pathological fracture: Secondary | ICD-10-CM | POA: Diagnosis not present

## 2022-07-04 DIAGNOSIS — R7301 Impaired fasting glucose: Secondary | ICD-10-CM

## 2022-07-04 DIAGNOSIS — E063 Autoimmune thyroiditis: Secondary | ICD-10-CM | POA: Diagnosis not present

## 2022-07-04 MED ORDER — LEVOTHYROXINE SODIUM 112 MCG PO TABS: 112.0000 ug | ORAL_TABLET | Freq: Every day | ORAL | 3 refills | Status: DC

## 2022-07-04 NOTE — Patient Instructions (Signed)
Take extra 100ug weekly

## 2022-07-04 NOTE — Progress Notes (Signed)
Patient ID: Ashlee Chavez, female   DOB: [birth year], 61 y.o.   MRN: [id number]           Chief complaint: Endocrinology follow-up  History of Present Illness:    OSTEOPOROSIS:   She had a bone density done in 2017 and this showed the following T-scores: Femoral neck: -2.7, previously -1.8 Spine: -1.9  By comparison she had a decline of 8-10 % in her bone density from 2 years before and 15% result from baseline She initially had a half inch height loss from baseline  She could not afford Evista and did not want to consider Reclast because of the possible high one time cost  In 05/2016 she was started on treatment with risedronate 150 mg for her declining bone density  She has finished her 5-year regimen and stopped it in 8/22  She is taking calcium supplements, regular on Os-Cal. Also regular with taking 5000 units of vitamin D with consistently normal vitamin D levels  BONE density: Previous study done by gynecologist in April 2021 shows T-score of the femoral neck to be -2.4 compared to -2.7 In 03/2022 with T score at the femoral neck is -2.6  LABS:  Lab Results  Component Value Date   VD25OH 40.63 07/02/2022   VD25OH 47.92 11/14/2020    HYPOTHYROIDISM:  She had a goiter diagnosed in 1996. She had a positive peroxidase antibody and has been on thyroxine since 1996  Her TSH was high in 2006 and she had symptoms of fatigue, cold intolerance and some hair loss  Since then has been on 75 mcg of Synthroid with occasional adjustments in the dose  Since 01/2014 she had been taking the same dose of levothyroxine, 75 g daily  Her TSH was higher than usual in 11/16 and she was having complaints about depression, insomnia, fatigue and more difficulties with her anxiety  Has had chronic cold sensitivity   Her dose was increased in 7/17 and in 1/19 it was further increased to to 137 mcg because of a high TSH She was not having any unusual fatigue but her TSH was gradually  increasing She was initially having some palpitations and shakiness but no symptoms on the visit when she was on 125 mcg  Recent history:   Since 8/21 has been taking 100 mcg levothyroxine 6-1/2 tablets a week with the half tablet on Sundays For the last 6 weeks she has felt sluggish and sleepy but no other symptoms Has not missed any doses  She does have a history of a small goiter  He has been consistent with taking the levothyroxine in the morning before breakfast.  She will take her multivitamin and calcium separately  TSH is now high at about 5.9  Wt Readings from Last 3 Encounters:  07/04/22 120 lb (54.4 kg)  02/20/22 124 lb (56.2 kg)  01/03/22 121 lb (54.9 kg)    Lab Results  Component Value Date   TSH 5.92 (H) 07/02/2022   TSH 1.12 01/01/2022   TSH 1.00 06/21/2021   FREET4 0.84 07/02/2022   FREET4 3.25 (H) 01/01/2022   FREET4 1.33 06/21/2021    OTHER active problems discussed and review of systems   Past Medical History:  Diagnosis Date   Gum lesion    Biopsy done-benign   Hypercholesteremia    Hypothyroid    LGSIL (low grade squamous intraepithelial dysplasia) 2010   Migraines    OCD (obsessive compulsive disorder)    Osteoporosis 01/2016   T score -2.7  Past Surgical History:  Procedure Laterality Date   basal cell excised N/A    BREAST BIOPSY Right    Benign   BREAST LUMPECTOMY  2001   right-benign   CATARACT EXTRACTION  2005   Gum Biopsy     Benign-2019   IR ANGIO EXTERNAL CAROTID SEL EXT CAROTID UNI R MOD SED  11/20/2021   IR ANGIO INTRA EXTRACRAN SEL INTERNAL CAROTID BILAT MOD SED  11/20/2021   IR ANGIO VERTEBRAL SEL VERTEBRAL BILAT MOD SED  11/20/2021  `  Family History  Problem Relation Age of Onset   Hypertension Mother    Heart disease Mother    Hypertension Father    Breast cancer Paternal Aunt        Age 63   Diabetes Neg Hx     Social History:  reports that she has never smoked. She has never used smokeless tobacco. She  reports current alcohol use. She reports that she does not use drugs.  Allergies:  Allergies  Allergen Reactions   Ampicillin Rash    Allergies as of 07/04/2022       Reactions   Ampicillin Rash        Medication List        Accurate as of July 04, 2022  8:23 AM. If you have any questions, ask your nurse or doctor.          ALPRAZolam 0.25 MG tablet Commonly known as: XANAX Take 1 tablet (0.25 mg total) by mouth daily as needed for anxiety.   atorvastatin 20 MG tablet Commonly known as: LIPITOR TAKE 1 TABLET BY MOUTH EVERY DAY   clomiPRAMINE 75 MG capsule Commonly known as: ANAFRANIL Take 1 capsule (75 mg total) by mouth at bedtime.   fluvoxaMINE 50 MG tablet Commonly known as: LUVOX Take 1 tablet (50 mg total) by mouth 2 (two) times daily.   levothyroxine 100 MCG tablet Commonly known as: SYNTHROID TAKE 1 TABLET BY MOUTH EVERY DAY   methylphenidate 36 MG CR tablet Commonly known as: CONCERTA Take 2 tablets (72 mg total) by mouth daily. What changed: Another medication with the same name was removed. Continue taking this medication, and follow the directions you see here. Changed by: Reather Littler, MD   methylphenidate 36 MG CR tablet Commonly known as: CONCERTA Take 2 tablets (72 mg total) by mouth daily. What changed: Another medication with the same name was removed. Continue taking this medication, and follow the directions you see here. Changed by: Reather Littler, MD   OLANZapine 10 MG tablet Commonly known as: ZYPREXA Take 1 tablet (10 mg total) by mouth at bedtime.   OSCAL 500/200 D-3 PO Take by mouth.   propranolol 20 MG tablet Commonly known as: INDERAL Take 1-2 tablets by mouth twice a day as needed for tremors   SUMAtriptan 100 MG tablet Commonly known as: IMITREX Take 100 mg by mouth daily as needed for migraine.   topiramate 100 MG tablet Commonly known as: TOPAMAX TAKE 1 TABLET BY MOUTH TWICE A DAY   VITAMIN D PO Take by mouth.          Review of Systems    HYPERCHOLESTEROLEMIA: She has been on Lipitor 20 mg for several years  LDL usually at target, now being followed by PCP also She has no other risk factors besides hypercholesterolemia Usually she is good with her low-fat diet  Lab Results  Component Value Date   CHOL 167 01/01/2022   HDL 73.20 01/01/2022   LDLCALC  80 01/01/2022   TRIG 70.0 01/01/2022   CHOLHDL 2 01/01/2022    GLUCOSE: Her fasting glucose has occasionally been high in the past Recently glucose 91  A1c is variable, higher than expected  No family history of diabetes  Lab Results  Component Value Date   HGBA1C 6.3 07/02/2022   HGBA1C 6.0 01/01/2022   HGBA1C 5.5 05/02/2020   Lab Results  Component Value Date   LDLCALC 80 01/01/2022   CREATININE 0.85 07/02/2022      PHYSICAL EXAM:  BP 112/70   Pulse 79   Ht 5\' 3"  (1.6 m)   Wt 120 lb (54.4 kg)   LMP 11/19/2010   SpO2 97%   BMI 21.26 kg/m   She looks well  Thyroid not palpable  Biceps reflexes on the right shows slow relaxation  No skin changes  ASSESSMENT:    HYPOTHYROIDISM:   She has longstanding primary hypothyroidism with a small goiter, likely from Hashimoto's thyroiditis  She is on a stable dose of 6-1/2 tablets a week of the 100 mcg levothyroxine Subjectively doing well Has only a small goiter which is unchanged on the right side  Her TSH is consistently in the normal range Free T4 is high possibly from her multivitamins with B complex   Osteoporosis: Bone density was slightly worse in 5/23 compared to 2021  She had taken Actonel for 5 years until 2022 Also on vitamin D supplements with therapeutic levels    PLAN:  She will take extra 1 tablet a week of her levothyroxine 100 mcg and when finished switch to 112 mcg To call if symptoms not improved  Continue vitamin D3, 5000 units daily and recheck labs on the next visit  She will go back on bisphosphonates for her osteoporosis but  for convenience and likely better efficacy will try her on intravenous Reclast annually Discussed how this works and will order this, likely should be less expensive than Actonel  Reminded her to reduce her aspirin intake as tolerated  Continue atorvastatin  Follow-up in 3 months    Zeyad Delaguila 07/04/2022, 8:23 AM

## 2022-07-10 ENCOUNTER — Ambulatory Visit (INDEPENDENT_AMBULATORY_CARE_PROVIDER_SITE_OTHER): Payer: BC Managed Care – PPO

## 2022-07-10 VITALS — BP 99/67 | HR 77 | Temp 98.3°F | Resp 18 | Ht 63.0 in | Wt 124.4 lb

## 2022-07-10 DIAGNOSIS — M810 Age-related osteoporosis without current pathological fracture: Secondary | ICD-10-CM

## 2022-07-10 MED ORDER — ZOLEDRONIC ACID 5 MG/100ML IV SOLN
5.0000 mg | Freq: Once | INTRAVENOUS | Status: AC
  Administered 2022-07-10: 5 mg via INTRAVENOUS
  Filled 2022-07-10: qty 100

## 2022-07-10 MED ORDER — SODIUM CHLORIDE 0.9 % IV SOLN: INTRAVENOUS | Status: DC

## 2022-07-10 MED ORDER — ACETAMINOPHEN 325 MG PO TABS
650.0000 mg | ORAL_TABLET | Freq: Once | ORAL | Status: AC
  Administered 2022-07-10: 650 mg via ORAL
  Filled 2022-07-10: qty 2

## 2022-07-10 NOTE — Progress Notes (Signed)
Diagnosis: Osteoporosis  Provider:  Chilton Greathouse MD  Procedure: Infusion  IV Type: Peripheral, IV Location: L Antecubital  Reclast (Zolendronic Acid), Dose: 5 mg  Infusion Start Time: 1521  Infusion Stop Time: 1550  Post Infusion IV Care: Observation period completed and Peripheral IV Discontinued  Discharge: Condition: Good, Destination: Home . AVS provided to patient.   Performed by:  Adriana Mccallum, RN

## 2022-07-10 NOTE — Patient Instructions (Signed)

## 2022-07-16 ENCOUNTER — Ambulatory Visit: Payer: BC Managed Care – PPO | Admitting: Psychiatry

## 2022-07-16 DIAGNOSIS — F422 Mixed obsessional thoughts and acts: Secondary | ICD-10-CM

## 2022-07-16 DIAGNOSIS — R69 Illness, unspecified: Secondary | ICD-10-CM

## 2022-07-16 DIAGNOSIS — F4010 Social phobia, unspecified: Secondary | ICD-10-CM

## 2022-07-16 DIAGNOSIS — Z634 Disappearance and death of family member: Secondary | ICD-10-CM | POA: Diagnosis not present

## 2022-07-16 DIAGNOSIS — F330 Major depressive disorder, recurrent, mild: Secondary | ICD-10-CM

## 2022-07-16 NOTE — Progress Notes (Signed)
Psychotherapy Progress Note [facility] Luan Moore, PhD LP  Patient ID: Ashlee Chavez)    MRN: [id number] Therapy format: Individual psychotherapy Date: 07/16/2022      Start: 8:18a     Stop: 9:04a     Time Spent: 46 min Location: In-person   Session narrative (presenting needs, interim history, self-report of stressors and symptoms, applications of prior therapy, status changes, and interventions made in session) Still seeing Ashlee Chavez.  Went bowling, made a dinner together (unusually), and did home cleaning this weekend.  He wants to get married, while she's getting used to just getting out again, and can't claim to love him like she did Ashlee Chavez ([place]).  Bothered by this.  Discussed state of the relationship, continuing to advocate that she doesn't have to feel the same as she did for Ashlee Chavez -- the chemistry can be different, the state of her emotional processing can be different, and the timing is certainly different, after the prolonged episode of depression/exhaustion she had, and perhaps she is more fundamentally Ashlee Chavez.  Still, she does enjoy him, he is unmistakably kind,   Medically, just had colonoscopy.  Gum surgery coming.  After that, figures she can take on financial planning.  Acknowledged that she has put away significant money maximizing her access to [employer] savings.  Does seem to have loosened her almost draconian savings standards a bit since encountering a string of needful health care costs.  Still wants to keep therapy to q 8 wks.    Since last met, Ashlee Chavez (part of the couple she attends church with) passed away.  Continues to go with Ashlee Chavez, his widow.  Still fulfilling.  Therapeutic modalities: Cognitive Behavioral Therapy, Solution-Oriented/Positive Psychology, and Ego-Supportive  Mental Status/Observations:  Appearance:   Casual     Behavior:  Appropriate  Motor:  Normal  Speech/Language:   Clear and Coherent  Affect:  Appropriate  Mood:   anxious and less  Thought process:  normal  Thought content:    worry , obsessions  Sensory/Perceptual disturbances:    WNL  Orientation:  Fully oriented  Attention:  Good    Concentration:  Good  Memory:  WNL  Insight:    Good  Judgment:   Good  Impulse Control:  Good   Risk Assessment: Danger to Self: No Self-injurious Behavior: No Danger to Others: No Physical Aggression / Violence: No Duty to Warn: No Access to Firearms a concern: No  Assessment of progress:  progressing  Diagnosis:   ICD-10-CM   1. Social anxiety disorder  F40.10     2. Mixed obsessional thoughts and acts  F42.2     3. Mild recurrent major depression ([place])  F33.0     4. Bereavement  Z63.4     5. R/O B12/folate deficiencies, anemia, monoamine synthesis problem  R69      Plan:  For social anxiety and OCD attached to relationship, experiment further in letting Ashlee Reaper do things at the house, accompany her to things, and repeat and progress in letting him see feet and other aspects of her body which may be ego dystonic to her.  Practice letting it be strange but true that she commends the attention and interest of a suitable man.  Practice letting it not have to be the same feeling she had with Ashlee Chavez to be legitimate. Maintain vitamin supplements, add omega-3 for anti-inflammatory benefits Work with timing on medication adjustments to maximize rest at night and alertness by day.  Address drug-induced sleepiness with  Dr. Clovis Pu.  Noted lip tremor and psychomotor retardation, along with reported fatigue.  Could signal nutritional, e.g., B1, B6, B12, D, E, or a medication-related issue. Although worries about making mistakes on the job are much lower did now, look for opportunities to challenge her worrying mind by coming up with mistakes that she can either imagine or "fake", and in any case look for a way to laugh about her own mind and intrusive worries Recommend asking boss -- at interest -- for additional  duties in slow weeks to make job more fulfilling and ego-syntonic Other recommendations/advice as may be noted above Continue to utilize previously learned skills ad lib Maintain medication as prescribed and work faithfully with relevant prescriber(s) if any changes are desired or seem indicated Call the clinic on-call service, 988/hotline, 911, or present to [facility] or ER if any life-threatening psychiatric crisis Return 6-8 wks at discretion. Already scheduled visit in this office 10/01/2022.  Ashlee Chavez Serve, PhD Luan Moore, PhD LP Clinical Psychologist, [facility] [address] [place], [place] [address] [phone]

## 2022-07-30 ENCOUNTER — Other Ambulatory Visit: Payer: Self-pay | Admitting: Psychiatry

## 2022-07-30 DIAGNOSIS — F422 Mixed obsessional thoughts and acts: Secondary | ICD-10-CM

## 2022-07-30 DIAGNOSIS — F332 Major depressive disorder, recurrent severe without psychotic features: Secondary | ICD-10-CM

## 2022-08-03 ENCOUNTER — Other Ambulatory Visit: Payer: Self-pay | Admitting: Psychiatry

## 2022-08-19 ENCOUNTER — Other Ambulatory Visit: Payer: Self-pay

## 2022-08-19 DIAGNOSIS — F900 Attention-deficit hyperactivity disorder, predominantly inattentive type: Secondary | ICD-10-CM

## 2022-08-19 MED ORDER — METHYLPHENIDATE HCL ER 36 MG PO TB24: 72.0000 mg | ORAL_TABLET | Freq: Every day | ORAL | 0 refills | Status: DC

## 2022-09-01 ENCOUNTER — Other Ambulatory Visit: Payer: Self-pay | Admitting: Endocrinology

## 2022-09-01 ENCOUNTER — Other Ambulatory Visit: Payer: Self-pay | Admitting: Psychiatry

## 2022-09-01 DIAGNOSIS — F422 Mixed obsessional thoughts and acts: Secondary | ICD-10-CM

## 2022-09-01 DIAGNOSIS — E063 Autoimmune thyroiditis: Secondary | ICD-10-CM

## 2022-09-01 DIAGNOSIS — F332 Major depressive disorder, recurrent severe without psychotic features: Secondary | ICD-10-CM

## 2022-09-12 ENCOUNTER — Ambulatory Visit (INDEPENDENT_AMBULATORY_CARE_PROVIDER_SITE_OTHER): Payer: BC Managed Care – PPO | Admitting: Psychiatry

## 2022-09-12 DIAGNOSIS — F330 Major depressive disorder, recurrent, mild: Secondary | ICD-10-CM | POA: Diagnosis not present

## 2022-09-12 DIAGNOSIS — F4010 Social phobia, unspecified: Secondary | ICD-10-CM

## 2022-09-12 DIAGNOSIS — F422 Mixed obsessional thoughts and acts: Secondary | ICD-10-CM | POA: Diagnosis not present

## 2022-09-12 NOTE — Progress Notes (Signed)
Psychotherapy Progress Note [facility] [name], PhD LP  Patient ID: Ashlee Chavez)    MRN: [id number] Therapy format: Individual psychotherapy Date: 09/12/2022      Start: 5:25p     Stop: 6:10p     Time Spent: 45 min Location: In-person   Session narrative (presenting needs, interim history, self-report of stressors and symptoms, applications of prior therapy, status changes, and interventions made in session) Coming up on 1 year dating Ashlee Chavez, who is still enjoying her a lot and looking to marry.  Still very anxiety provoking too her, prone to obsess about not feeling "in love" with him, comparing to how she felt with Ashlee Chavez.  Discussed at length, clarified values, elicited how she does respond to him, which is clearly interested, appreciative, and warm, and how she has become much less self-conscious with him.  As a supporter and validator, does seem to have filled in the hole made by loss of her father, and it is very clear he's willing to adjust and even convert spiritually.  Based on a range of stories, he sounds to be enormously patient, steadfast, and helpful, clearly absent the issues that ended the 4 significant relationships she's seen in the time known to Ashlee Chavez.  Interpreted resistance to feeling vulnerable again and possible letdown blinding her to the evidence that Ashlee Chavez is ready, interested, and free to commit.  Based on one account of his gestures, suggested she look at a children's book, Guess How Much I Love You by Eloisa Northern, to help herself see his intent and get past her resistance.  Therapeutic modalities: Cognitive Behavioral Therapy, Solution-Oriented/Positive Psychology, and Ego-Supportive  Mental Status/Observations:  Appearance:   Casual     Behavior:  Appropriate  Motor:  Normal  Speech/Language:   Clear and Coherent  Affect:  Appropriate  Mood:  anxious  Thought process:  normal  Thought content:    Obsessions   Sensory/Perceptual disturbances:    WNL  Orientation:  Fully oriented  Attention:  Good    Concentration:  Good  Memory:  WNL  Insight:    Good  Judgment:   Good  Impulse Control:  Good   Risk Assessment: Danger to Self: No Self-injurious Behavior: No Danger to Others: No Physical Aggression / Violence: No Duty to Warn: No Access to Firearms a concern: No  Assessment of progress:  progressing  Diagnosis:   ICD-10-CM   1. Social anxiety disorder  F40.10     2. Mixed obsessional thoughts and acts  F42.2     3. Mild recurrent major depression (HCC)  F33.0      Plan:  For social anxiety and OCD attached to relationship, experiment further in letting Ashlee Picket do things at the house, accompany her to things, and repeat and progress in letting him see feet and other aspects of her body which may be ego dystonic to her.  Practice letting it be strange but true that she commends the attention and interest of a suitable man.  Practice letting it not have to be the same feeling she had with Ashlee Chavez to be legitimate. Priority on reevaluating [name] actions and messages, option to read children's book for perspective Maintain vitamin supplements, add omega-3 for anti-inflammatory benefits Work with timing on medication adjustments to maximize rest at night and alertness by day.  Address drug-induced sleepiness with Dr. Jennelle Human.  Noted lip tremor and psychomotor retardation, along with reported fatigue.  Could signal nutritional, e.g., B1, B6, B12, D, E, or  a medication-related issue. Although worries about making mistakes on the job are much lower did now, look for opportunities to challenge her worrying mind by coming up with mistakes that she can either imagine or "fake", and in any case look for a way to laugh about her own mind and intrusive worries Recommend asking boss -- at interest -- for additional duties in slow weeks to make job more fulfilling and ego-syntonic Other  recommendations/advice as may be noted above Continue to utilize previously learned skills ad lib Maintain medication as prescribed and work faithfully with relevant prescriber(s) if any changes are desired or seem indicated Call the clinic on-call service, 988/hotline, 911, or present to [name] or ER if any life-threatening psychiatric crisis No follow-ups on file. Already scheduled visit in this office 10/01/2022.  Blanchie Serve, PhD Luan Moore, PhD LP Clinical Psychologist, [facility] [address] [place], [place] [address] [phone]

## 2022-09-13 ENCOUNTER — Ambulatory Visit: Payer: BC Managed Care – PPO | Admitting: Psychiatry

## 2022-09-20 ENCOUNTER — Other Ambulatory Visit: Payer: Self-pay | Admitting: Endocrinology

## 2022-09-20 DIAGNOSIS — E063 Autoimmune thyroiditis: Secondary | ICD-10-CM

## 2022-09-22 ENCOUNTER — Other Ambulatory Visit: Payer: Self-pay | Admitting: Psychiatry

## 2022-09-22 DIAGNOSIS — R251 Tremor, unspecified: Secondary | ICD-10-CM

## 2022-09-23 ENCOUNTER — Other Ambulatory Visit: Payer: Self-pay

## 2022-09-23 DIAGNOSIS — F900 Attention-deficit hyperactivity disorder, predominantly inattentive type: Secondary | ICD-10-CM

## 2022-09-23 MED ORDER — METHYLPHENIDATE HCL ER 36 MG PO TB24: 72.0000 mg | ORAL_TABLET | Freq: Every day | ORAL | 0 refills | Status: DC

## 2022-10-01 ENCOUNTER — Encounter: Payer: Self-pay | Admitting: Psychiatry

## 2022-10-01 ENCOUNTER — Ambulatory Visit: Payer: BC Managed Care – PPO | Admitting: Psychiatry

## 2022-10-01 DIAGNOSIS — F330 Major depressive disorder, recurrent, mild: Secondary | ICD-10-CM

## 2022-10-01 DIAGNOSIS — F4010 Social phobia, unspecified: Secondary | ICD-10-CM | POA: Diagnosis not present

## 2022-10-01 DIAGNOSIS — E638 Other specified nutritional deficiencies: Secondary | ICD-10-CM

## 2022-10-01 DIAGNOSIS — F422 Mixed obsessional thoughts and acts: Secondary | ICD-10-CM | POA: Diagnosis not present

## 2022-10-01 DIAGNOSIS — F900 Attention-deficit hyperactivity disorder, predominantly inattentive type: Secondary | ICD-10-CM | POA: Diagnosis not present

## 2022-10-01 DIAGNOSIS — F332 Major depressive disorder, recurrent severe without psychotic features: Secondary | ICD-10-CM

## 2022-10-01 DIAGNOSIS — G43009 Migraine without aura, not intractable, without status migrainosus: Secondary | ICD-10-CM

## 2022-10-01 MED ORDER — FLUVOXAMINE MALEATE 100 MG PO TABS: 50.0000 mg | ORAL_TABLET | Freq: Two times a day (BID) | ORAL | 1 refills | Status: DC

## 2022-10-01 MED ORDER — OLANZAPINE 10 MG PO TABS: 10.0000 mg | ORAL_TABLET | Freq: Every day | ORAL | 1 refills | Status: DC

## 2022-10-01 MED ORDER — METHYLPHENIDATE HCL ER 36 MG PO TB24: 72.0000 mg | ORAL_TABLET | Freq: Every day | ORAL | 0 refills | Status: DC

## 2022-10-01 MED ORDER — METHYLPHENIDATE HCL ER (OSM) 36 MG PO TBCR: 72.0000 mg | EXTENDED_RELEASE_TABLET | Freq: Every day | ORAL | 0 refills | Status: DC

## 2022-10-01 MED ORDER — TOPIRAMATE 100 MG PO TABS: 100.0000 mg | ORAL_TABLET | Freq: Two times a day (BID) | ORAL | 0 refills | Status: DC

## 2022-10-01 MED ORDER — CLOMIPRAMINE HCL 75 MG PO CAPS: 75.0000 mg | ORAL_CAPSULE | Freq: Every day | ORAL | 1 refills | Status: DC

## 2022-10-01 NOTE — Progress Notes (Signed)
DON GIARRUSSO [id number] 1961-05-14 61 y.o.  Subjective:   Patient ID:  Ashlee Chavez is a 61 y.o. (DOB 1961-10-08) female.  Chief Complaint:  Chief Complaint  Patient presents with   Follow-up    Mixed obsessional thoughts and acts   Anxiety   Depression   ADD    Depression        Associated symptoms include headaches.  Associated symptoms include no decreased concentration, no appetite change and no suicidal ideas.  Past medical history includes anxiety.   Anxiety Patient reports no confusion, decreased concentration, dizziness, palpitations or suicidal ideas.     Ashlee Chavez presents today for follow-up of recently worse depression DT relationship loss.    When seen February 10, 2019.  She had received some benefit from Ritalin 20 mg 3 times daily but was having issues with the crash.  We switched to Concerta 72 mg every morning.  September 14, 2019.  She was having persistent bothersome OCD and was interested in further medicine changes to try to help with these symptoms because she has aggressively pursued therapy and still has significant symptoms.  She has been on multiple SSRIs and so we initiated augmentation with risperidone half milligram daily to increase to 1 mg daily. Sig improvement with risperidone.  SE low appetite with it.  Not hungry for dinner. Sleep so much better also.  Improvement is "marked".  Think better bc less obsessing time.  Not constant now.  Noticed benefit within the first week.  Less obsessing lessened depression.  But residual symptoms.  visit October 14, 2019.  She was encouraged to experiment with the dosing of the risperidone up to 2 mg or 3 mg nightly to see if she could get additional reduction in her chronic anxiety and OCD.   seen December 15, 2019.  Settled at 2 mg risperidone bc increased sleep.  Sleep is so much better.  Harder to get up in the morning.   Found out insurance won't cover Concerta anymore.  Not as good on Ritalin TID.   Not as even a response.  Terrible insurance. Therefore the only med change with switching to Focalin to see if it would be covered better.  Appt 7/61/95, switch to Concerta 72 mg is better with better mood and motivation vs Focalin.  Focus is also better.  Very poor function with Focalin and better with Concerta.   Better overall with more even response to stimulant and better appetite.  No SE.  Duration all day.  Less desperate hopeless feelings on the stimulant.  Stressful times. With increase risperidone a little better mood bump.  SE a little shakiness noticed in jaw.   Not unusual levels of anxiety.  Lost her best friend 1 year ago and still grieving.  Not much family. Alone.  Not close to B and S who live elsewhere.  Talks to God daily.  Gets to work daily.  Function is good there.  Reads and calls people.  OCD no worse with Covid. Did join a Bible study which was a huge step. Plan start lamotrigine   04/04/20 appt with the following noted: Appt moved up.  She thought she was supposed to stop risperidone when started lamotrigine.  Started having SI off the risperidone but wasn't sure if it was SE.  No risperidone 3 mg for 3 weeks.Anxiety shaking sweating crying all started right away.  Also trouble with sleep and can't eat.  Plan:Restart risperidone 1 mg now and another milligram in  2 to 3 hours if not feeling sufficient relief to get the full dose of 3 mg today.  Then resume 3 mg risperidone each evening. Restart lamotrigine 25 mg daily for 2 weeks and follow the previous instructions on increasing it.  05/11/2020 appointment the following is noted: Marked improvement back on risperidone 3 mg.  SE shakes jaw.  Doesn't affect typing. Started lamotrigine and up to 100 mg for 2 weeks.  Feels it's helped depression. Better mood and able to laugh.  Able to get pizza for Bible study and not cry. Apptetite is not great.  Working on it. Anxiety is better not gone. Plan: no med changes  07/26/20  appt with the following noted; Increased lamotrigine gradually to 200 mg daily over the phone 6 weeks ago.  No improvement really for depression. With it. CC depression.   Risperidone still helping anxiety. Plan: Increase lamotrigine to 300 mg daily..  09/14/2020 appointment with following noted: Started taper of lamotrigine DT NR. Started latuda 20 mg daily for 10 days.  Tolerated fine.  No effects so far.. Main problem is still depression.  Anxiety is not too bad.  No energy, motivation, sleeeping a lot.  No sig SI. In past started risperidone for anxiety and depression.  Increase Latuda to 40 mg daily. Disc potential DDI with risperidone reducing effectiveness of  Latuda so reduc risperidone to 1.5 mg HS  10/09/2020 phone call from patient: Patient left a voicemail on Friday while we were out of office for the Holiday that she is not any better. She feels the cutting in the 1/2 of Risperidone has really increased her anxiety. She was instructed to call back if not any better but concerned that taking away the Risperidone will really exacerbate her anxiety.  MD response:  At the last appointment we increased Latuda to 40 mg daily and reduced risperidone to half tablet daily.  The goal was to use Latuda and eliminate risperidone.  However since her anxiety has worsened with the reduction in risperidone, I agree with her and she should take the full dose of risperidone as well as continue the Latuda 40 mg daily.  The [name] is intended to help depression.  11/23/2020 appointment with the following noted: Propranolol only once daily. No better with higher Latuda.  No SE either. No change in appetite which is poor.  Still struggling with depression and OCD is a little worse with changes at work. Off lamotrigine. Plan: 5-HTP supplement 200 mg daily  Increase Latuda to 60 mg  for 3 weeks, If no benefit and no SE increase to 80 mg daily with food.  ADD still better with Concerta 72 mg versus  Focalin. Continue high dosage paroxetine 80 mg daily.  01/16/2021 appointment with following noted: Increase Latuda to 80 mg without benefit or SE.  Anxiety and depression with poor STM.  Food getting caught in her throat.  No change in eating.  No change in weight.   Anxious thoughts mostly about work even when not there.  Anxious about work needed on the house she can't afford.  Enjoys reading but feels guilty that she should be doing something else.  More general anxiety than OCD. 5-HTP supplement 200 mg daily Wean Latuda over a week DT NR Reduce mirtazapine to 30 mg HS  ADD still better with Concerta 72 mg versus Focalin. Start clomipramine 25 mg capsules 1 at night for 1 week, Then increase clomipramine to 2 capsules at night and reduce paroxetine to 1-1/2 tablets  or 60 mg daily. Wait 1 week and then get the blood test   03/15/2021 appt noted: OK not great.  About the same.  Not much change in the last 8 weeks. SE none Lab 02/20/21 clomipramine 50 + Luvox 100 = clomip 195 + norclomip 111= total 306 Losing weight and no appetite and hard to eat. Weight loss to 107#. A lot of anxiety and depression   Sleep less sound with less mirtazapine.  Plan: Due to the severity of symptoms,Add olanzapine 5 mg tablet 1 at night Stop mirtazapine Reduce risperidone to 1/2 tablet for 1 week then stop it.  Call if SI recurs.  03/30/2021 appointment with the following noted: It makes me a little tired but so far anxiety is improved.  No problems off risperidone.  Appetite is not better but did get protein bars and is eating.   Less panic.  But still occurs.  Things just started this week to be a little better.  Not huge but it's a start.  A little more energy. Sleep 10 hours but that's unchanged with olanzapine. First time I've had hope in a long time. Plan: A little better with olanzapine 5 mg HS vs risperidone 3 mg. Increase olanzapine to 10 mg HS  Call in 2 weeks if you are not seeing progressive  improvement  04/12/2021 appointment with the following noted: Better with anxiety.  Sleep better.  Daytime could nap if not busy.  Otherwise OK.  Not much change in appetite.  Up to 110# about 2.5# better and trying to get protein twice daily.   A little change in OCD.   Depression is better some and staying up later and doing more than she was.  Doing some things after dinner which is huge.  Big change with depression.  Not retreating to bed as quickly. Plan: continue meds.  05/18/21 appt noted: Further improvement in anxiety.  I feel better but still not going out but not excessively sleeping.  Acting more normally, eating better.  Eating dinner again.  Sleep reduced to 8 hours. SE dry mouth and a little hungrier. Asks about going up on olanzapine to further reduce anxiety. [name] and describes better from 10/10 to 4/10 for anxiety and depression down to 3/10.Marland Kitchen  Anxiety symptoms include: Excessive Worry, Obsessive Compulsive Symptoms:   Handwashing,,.  Pt reports no sleep issues. Pt reports that appetite is good. Pt reports that energy is good and down slightly. Concentration is no change. Suicidal thoughts:  denied by patient. Plan increase olanzapine to 15 mg HS DT partial benefit  06/22/21 appt noted: Too tired and sleepy with 15 mg olanzapine and reduced the dose to 10 mg daily. No other med changes. Overall about the same.  Don't do anything different.  Happy that it's better than it was stays up to 930 until 6 PM.  Don't go out except Bible study and church once weekly. Goes to grocery early to avoid people. Anxiety lately worse than the depression. Dx pulsatile tinnitus Plan: cut olanzapine back to 10 mg HS Increase clomipramine to 75 mg HS  08/13/2021 appointment with the following noted: Anxiety is "good" after increase clomipramine.  Used to be desperate to get home and not now.  Was thinking of disability and now it's better.  I texted a guy on  a dating site first time in 2 years.  It's a big deal.  Started exercising a little.   SE no worse with  increase clomipramine. Depression is better too and manageable with brief downturns.   Sleep is pretty good. Plan no med changes  10/30/2021 appt noted: Pretty good and a little better than last time.  Still getting out a little with a guy she met on dating site and it's going ok.  Still prefers to be home.  Still get anxious going out. Overall best response so far with any med for anxiety.   Depression is manageable. SE is OK Staying up more appropriately late and sleep is OK. Plan : Much better with clomipramine 75 HS Also continue olanzapine 10 HS & fluvoxamine 50 Benefit Concerta 72 mg a.m. Taking propranolol 20 BID for tremor No med changes  01/29/2022 appointment with the following noted: Pretty good and probably doing better. Handled triggers with less obsessions.  Had to have bx breast today and hasn't been obsessing like she would ususally.  Better than she ever expected. Surprising. Feels the med has hit it's stride at this point. No sig caffeine.  Tremor managed. Depresion managed. Concerta is fine without problems.  Not noticeable when it wears off. Sleep good lately. Yes increase fluvoxamine to 100 mg HS and 50 mg AM Repeat clomipramine level .    02/11/22 lab noted: She is now on fluvoxamine 150 mg daily and clomipramine 75 mg daily.  It is known that fluvoxamine can affect the blood level of clomipramine but that is not a problem in her case.  We increased the fluvoxamine at her last appointment on January 29, 2022 to the current dosage.  Let her know there is room to increase either fluvoxamine to 200 mg daily which can be split between morning and evening if desired or taken all at night if desired.  Or the other alternative if she prefers is to increase clomipramine to 100 mg at night.  Either 1 of those options may help further decrease anxiety and obsessions.  However if  she would prefer to keep the dosage the same that is okay as well. Lynder Parents, MD, DFAPA  04/30/22 appt noted: Tried increasing clomipramine and then fluvoxamine and it made her too sleepy. Depression managed. Still a little OCD and anxiety but pretty much managed unless triggers. Getting out socially and dating which causes anxiety but doing OK. Tolerating meds ok now. Sleep 9-5.  Likes to nap but not usually. No SI in a long time. Plan: Could not tolerate fluvoxamine 100 or clomipramine 100 DT sleepiness Therefore continue fluvoxamine 50 mg twice daily and clomipramine 75 mg nightly Continue olanzapine 10 mg nightly ADD still better with Concerta 72 mg versus Focalin. 5-HTP supplement 200 mg daily No med changes today . . 10/01/2022 appointment noted: Continues same med.  Insurance won't pay for more than 1 tablet daily fluvoxamine. SE some tiredness but sleeps well. Manageable depression .  Anxiety better with meds but not gone.   Concerta still helping.   Numerous failed psychiatric medication trials include Lexapro 60 mg a day, Zoloft 250, Paxil with weight gain,  Fluvoxamine 100 Clomipramine 50,  (Could not tolerate fluvoxamine 100 or clomipramine 100 DT sleepiness Therefore continue fluvoxamine 50 mg twice daily and clomipramine 75 mg nightly)  Wellbutrin 300 with side effects,  mirtazapine Rexulti which helped initially,  Vraylar, Abilify between 1 and 2.5 mg and up to 15 mg with no response,  Latuda 80 failed Risperidone Olanzapine 15 sedation. Lamotrigine 300 NR buspirone 30 mg,  Cytomel,  lithium 625,  pramipexole with insomnia,   Lunesta with  crying spells,  topiramate, Deplin, propranolol,   Ritalin, Focalin NR, Concerta 72 helped No history of Prozac, Viibryd.  Review of Systems:  Review of Systems  Constitutional:  Negative for appetite change, diaphoresis and unexpected weight change.  HENT:  Positive for tinnitus.   Cardiovascular:  Negative for  palpitations.  Neurological:  Positive for tremors and headaches. Negative for dizziness and light-headedness.       Mouth  Psychiatric/Behavioral:  Negative for agitation, behavioral problems, confusion, decreased concentration, hallucinations, self-injury, sleep disturbance and suicidal ideas. The patient is not hyperactive.     Medications: I have reviewed the patient's current medications.  Current Outpatient Medications  Medication Sig Dispense Refill   ALPRAZolam (XANAX) 0.25 MG tablet Take 1 tablet (0.25 mg total) by mouth daily as needed for anxiety. 30 tablet 0   atorvastatin (LIPITOR) 20 MG tablet TAKE 1 TABLET BY MOUTH EVERY DAY 90 tablet 1   Calcium Carbonate-Vitamin D (OSCAL 500/200 D-3 PO) Take by mouth.     Cholecalciferol (VITAMIN D PO) Take by mouth.     levothyroxine (SYNTHROID) 112 MCG tablet Take 1 tablet (112 mcg total) by mouth daily. 90 tablet 3   propranolol (INDERAL) 20 MG tablet TAKE 1-2 TABLETS BY MOUTH TWICE A DAY AS NEEDED FOR TREMORS 100 tablet 3   SUMAtriptan (IMITREX) 100 MG tablet Take 100 mg by mouth daily as needed for migraine.     clomiPRAMINE (ANAFRANIL) 75 MG capsule Take 1 capsule (75 mg total) by mouth at bedtime. 90 capsule 1   fluvoxaMINE (LUVOX) 100 MG tablet Take 0.5 tablets (50 mg total) by mouth 2 (two) times daily. 90 tablet 1   [START ON 10/15/2022] methylphenidate 36 MG PO CR tablet Take 2 tablets (72 mg total) by mouth daily. 60 tablet 0   methylphenidate 36 MG PO CR tablet Take 2 tablets (72 mg total) by mouth daily. 60 tablet 0   OLANZapine (ZYPREXA) 10 MG tablet Take 1 tablet (10 mg total) by mouth at bedtime. 90 tablet 1   topiramate (TOPAMAX) 100 MG tablet Take 1 tablet (100 mg total) by mouth 2 (two) times daily. 180 tablet 0   No current facility-administered medications for this visit.    Medication Side Effects:  Watch jaw tremor  Allergies:  Allergies  Allergen Reactions   Ampicillin Rash    Past Medical History:   Diagnosis Date   Gum lesion    Biopsy done-benign   Hypercholesteremia    Hypothyroid    LGSIL (low grade squamous intraepithelial dysplasia) 2010   Migraines    OCD (obsessive compulsive disorder)    Osteoporosis 01/2016   T score -2.7    Family History  Problem Relation Age of Onset   Hypertension Mother    Heart disease Mother    Hypertension Father    Breast cancer Paternal Aunt        Age 5   Diabetes Neg Hx     Social History   Socioeconomic History   Marital status: Single    Spouse name: Not on file   Number of children: Not on file   Years of education: Not on file   Highest education level: Not on file  Occupational History   Not on file  Tobacco Use   Smoking status: Never   Smokeless tobacco: Never  Vaping Use   Vaping Use: Never used  Substance and Sexual Activity   Alcohol use: Yes    Alcohol/week: 0.0 standard drinks of alcohol  Comment: wine occassionally   Drug use: No   Sexual activity: Not Currently    Birth control/protection: Post-menopausal    Comment: 1st intercourse 37 yo-5 partners  Other Topics Concern   Not on file  Social History Narrative   Not on file   Social Determinants of Health   Financial Resource Strain: Not on file  Food Insecurity: Not on file  Transportation Needs: Not on file  Physical Activity: Not on file  Stress: Not on file  Social Connections: Not on file  Intimate Partner Violence: Not on file    Past Medical History, Surgical history, Social history, and Family history were reviewed and updated as appropriate.   Please see review of systems for further details on the patient's review from today.   Objective:   Physical Exam:  LMP 11/19/2010   Physical Exam Constitutional:      General: She is not in acute distress.    Appearance: Normal appearance. She is well-developed.  Musculoskeletal:        General: No deformity.  Neurological:     Mental Status: She is alert and oriented to person,  place, and time.     Motor: Tremor present.     Coordination: Coordination normal.     Comments: Mild mouth tremor  Psychiatric:        Attention and Perception: She is attentive. She does not perceive auditory hallucinations.        Mood and Affect: Mood is anxious. Mood is not depressed. Affect is not labile, blunt, angry or inappropriate.        Speech: Speech is not delayed.        Behavior: Behavior is not slowed.        Thought Content: Thought content is not paranoid or delusional. Thought content does not include homicidal or suicidal ideation. Thought content does not include suicidal plan.        Cognition and Memory: Cognition normal.        Judgment: Judgment normal.     Comments: Insight intact. No auditory or visual hallucinations. No delusions. Less checking. Gradually improving OCD Severe sx fimproved with both anxiety and depression.  Clomiprmaine helped.       Lab Review:     Component Value Date/Time   NA 141 07/02/2022 0806   K 3.9 07/02/2022 0806   CL 107 07/02/2022 0806   CO2 24 07/02/2022 0806   GLUCOSE 91 07/02/2022 0806   BUN 22 07/02/2022 0806   CREATININE 0.85 07/02/2022 0806   CALCIUM 9.7 07/02/2022 0806   PROT 6.9 07/02/2022 0806   ALBUMIN 4.2 07/02/2022 0806   AST 22 07/02/2022 0806   ALT 20 07/02/2022 0806   ALKPHOS 56 07/02/2022 0806   BILITOT 0.2 07/02/2022 0806   GFRNONAA >60 11/20/2021 0703       Component Value Date/Time   WBC 6.9 11/20/2021 0703   RBC 4.72 11/20/2021 0703   HGB 15.4 (H) 11/20/2021 0703   HCT 46.3 (H) 11/20/2021 0703   PLT 156 11/20/2021 0703   MCV 98.1 11/20/2021 0703   MCH 32.6 11/20/2021 0703   MCHC 33.3 11/20/2021 0703   RDW 13.1 11/20/2021 0703   LYMPHSABS 1.0 11/20/2021 0703   MONOABS 0.4 11/20/2021 0703   EOSABS 0.1 11/20/2021 0703   BASOSABS 0.0 11/20/2021 0703    No results found for: "POCLITH", "LITHIUM"   No results found for: "PHENYTOIN", "PHENOBARB", "VALPROATE", "CBMZ"   .res Assessment:  Plan:    Ashlin was seen  today for follow-up, anxiety, depression and add.  Diagnoses and all orders for this visit:  Mild recurrent major depression ([place])  Social anxiety disorder -     fluvoxaMINE (LUVOX) 100 MG tablet; Take 0.5 tablets (50 mg total) by mouth 2 (two) times daily. -     clomiPRAMINE (ANAFRANIL) 75 MG capsule; Take 1 capsule (75 mg total) by mouth at bedtime. -     OLANZapine (ZYPREXA) 10 MG tablet; Take 1 tablet (10 mg total) by mouth at bedtime.  Mixed obsessional thoughts and acts -     fluvoxaMINE (LUVOX) 100 MG tablet; Take 0.5 tablets (50 mg total) by mouth 2 (two) times daily. -     clomiPRAMINE (ANAFRANIL) 75 MG capsule; Take 1 capsule (75 mg total) by mouth at bedtime. -     OLANZapine (ZYPREXA) 10 MG tablet; Take 1 tablet (10 mg total) by mouth at bedtime.  Attention deficit hyperactivity disorder (ADHD), predominantly inattentive type -     methylphenidate 36 MG PO CR tablet; Take 2 tablets (72 mg total) by mouth daily.  Major depressive disorder, recurrent severe without psychotic features ([place]) -     OLANZapine (ZYPREXA) 10 MG tablet; Take 1 tablet (10 mg total) by mouth at bedtime.  Imbalanced nutrition -     OLANZapine (ZYPREXA) 10 MG tablet; Take 1 tablet (10 mg total) by mouth at bedtime.  Migraine without aura and without status migrainosus, not intractable -     topiramate (TOPAMAX) 100 MG tablet; Take 1 tablet (100 mg total) by mouth 2 (two) times daily.  Other orders -     methylphenidate 36 MG PO CR tablet; Take 2 tablets (72 mg total) by mouth daily.   We discussed TRD and anxiety . she has a long list of failed psychiatric meds.  She has not tended to do well with higher dosages of SSRIs.  Patient has been markedly depressed and anxious but, much better Since adding olanzapine 10 mg she has seen a significant improvement in depression and anxiety. Much better with clomipramine 75 HS  Overall she is satisfied with the current med  regimen  Chart review revealed that prior to starting treatment here patient had taken clomipramine in the past with some benefit but also with some side effects.  It is a tricyclic antidepressant distinctly different from SSRIs.  Some patients respond better to them.  She has a history of previous benefit.  It makes sense to retry that medication.  We discussed the value of doing blood levels with tricyclic antidepressants like clomipramine.  She agrees.  Lab 02/20/21 clomipramine 50 + Luvox 100 = clomip 195 + norclomip 111= total 306 (normal 220-500) Better with Increase clomipramine to 75 mg HS 02/11/2022 clomipramine level 183 history people, nor clomipramine level 89, total 272 on clomipramine 75 mg nightly and fluvoxamine 50 mg twice daily.  Disc dental risks and SE risks with meds.  Could not tolerate fluvoxamine 100 or clomipramine 100 DT sleepiness Therefore continue fluvoxamine 50 mg twice daily and clomipramine 75 mg nightly Continue olanzapine 10 mg nightly ADD still better with Concerta 72 mg versus Focalin. 5-HTP supplement 200 mg daily No med changes today . Marland Kitchen She is continuing psychotherapy with Dr. Rica Mote  Discussed potential metabolic side effects associated with atypical antipsychotics, as well as potential risk for movement side effects. Advised pt to contact office if movement side effects occur.  She agrees with the plan  Disc dx pulsatile tinnitus and possible treatment of gabapentin.  Mouth tremor appears unchanged since the switch from risperidone to olanzapine.  We will continue to monitor.  Follow-up in 3 mos  Lynder Parents MD, DFAPA  Future Appointments  Date Time Provider [place]  11/13/2022  8:30 AM [facility] None  11/19/2022  2:30 PM Elayne Snare, MD [facility] None  02/24/2023  3:00 PM Marny Lowenstein A, NP [facility] None    No orders of the defined types were placed in this encounter.      -------------------------------

## 2022-10-04 ENCOUNTER — Other Ambulatory Visit: Payer: Self-pay | Admitting: Psychiatry

## 2022-10-04 DIAGNOSIS — F4010 Social phobia, unspecified: Secondary | ICD-10-CM

## 2022-10-04 DIAGNOSIS — F422 Mixed obsessional thoughts and acts: Secondary | ICD-10-CM

## 2022-10-23 DIAGNOSIS — G43719 Chronic migraine without aura, intractable, without status migrainosus: Secondary | ICD-10-CM | POA: Diagnosis not present

## 2022-11-01 ENCOUNTER — Other Ambulatory Visit: Payer: BC Managed Care – PPO

## 2022-11-06 ENCOUNTER — Ambulatory Visit: Payer: BC Managed Care – PPO | Admitting: Endocrinology

## 2022-11-13 ENCOUNTER — Other Ambulatory Visit (INDEPENDENT_AMBULATORY_CARE_PROVIDER_SITE_OTHER): Payer: BC Managed Care – PPO

## 2022-11-13 DIAGNOSIS — E063 Autoimmune thyroiditis: Secondary | ICD-10-CM | POA: Diagnosis not present

## 2022-11-13 LAB — TSH: TSH: 1.23 u[IU]/mL (ref 0.35–5.50)

## 2022-11-19 ENCOUNTER — Encounter: Payer: Self-pay | Admitting: Endocrinology

## 2022-11-19 ENCOUNTER — Ambulatory Visit: Payer: BC Managed Care – PPO | Admitting: Endocrinology

## 2022-11-19 VITALS — BP 94/60 | HR 71 | Ht 63.0 in | Wt 123.4 lb

## 2022-11-19 DIAGNOSIS — E7800 Pure hypercholesterolemia, unspecified: Secondary | ICD-10-CM

## 2022-11-19 DIAGNOSIS — R7301 Impaired fasting glucose: Secondary | ICD-10-CM

## 2022-11-19 DIAGNOSIS — E063 Autoimmune thyroiditis: Secondary | ICD-10-CM

## 2022-11-19 DIAGNOSIS — E559 Vitamin D deficiency, unspecified: Secondary | ICD-10-CM

## 2022-11-19 MED ORDER — LEVOTHYROXINE SODIUM 100 MCG PO TABS: 100.0000 ug | ORAL_TABLET | Freq: Every day | ORAL | 3 refills | Status: DC

## 2022-11-19 NOTE — Progress Notes (Signed)
Patient ID: Ashlee Chavez, female   DOB: October 06, 1961, 62 y.o.   MRN: [id number]           Chief complaint: Endocrinology follow-up  History of Present Illness:    OSTEOPOROSIS:   She had a bone density done in 2017 and this showed the following T-scores: Femoral neck: -2.7, previously -1.8 Spine: -1.9  By comparison she had a decline of 8-10 % in her bone density from 2 years before and 15% result from baseline She initially had a half inch height loss from baseline  She could not afford Evista and did not want to consider Reclast because of the possible high one time cost  In 05/2016 she was started on treatment with risedronate 150 mg for her declining bone density  She finished her 5-year regimen and stopped it in 8/22  She has been started on Reclast: First infusion 07/10/22 She had no side effects with this  She is taking calcium supplements, regular on Os-Cal. Also regular with taking 5000 units of vitamin D with consistently normal vitamin D levels  BONE density: Previous study done by gynecologist in April 2021 shows T-score of the femoral neck to be -2.4 compared to -2.7 In 03/2022 with T score at the femoral neck is -2.6  LABS:  Lab Results  Component Value Date   VD25OH 40.63 07/02/2022   VD25OH 47.92 11/14/2020    HYPOTHYROIDISM:  She had a goiter diagnosed in 1996. She had a positive peroxidase antibody and has been on thyroxine since 1996  Her TSH was high in 2006 and she had symptoms of fatigue, cold intolerance and some hair loss  Since then has been on 75 mcg of Synthroid with occasional adjustments in the dose  Since 01/2014 she had been taking the same dose of levothyroxine, 75 g daily  Her TSH was higher than usual in 11/16 and she was having complaints about depression, insomnia, fatigue and more difficulties with her anxiety  Has had chronic cold sensitivity   Her dose was increased in 7/17 and in 1/19 it was further increased to to 137 mcg  because of a high TSH She was not having any unusual fatigue but her TSH was gradually increasing She was initially having some palpitations and shakiness but no symptoms on the visit when she was on 125 mcg  Recent history:  Since 8/23 she was on a dose of 112 mcg levothyroxine However she says she could not get the refill processed in November and went back to taking 100 mcg from her previous prescription She still complains of daytime sleepiness, also has chronic cold intolerance which is not any different No weight change Has not missed any doses  She does have a history of a small goiter previously  He has been consistent with taking the levothyroxine in the morning before breakfast.  She will take her multivitamin and calcium separately  TSH is now back to normal at 1.2  Wt Readings from Last 3 Encounters:  11/19/22 123 lb 6.4 oz (56 kg)  07/10/22 124 lb 6.4 oz (56.4 kg)  07/04/22 120 lb (54.4 kg)    Lab Results  Component Value Date   TSH 1.23 11/13/2022   TSH 5.92 (H) 07/02/2022   TSH 1.12 01/01/2022   FREET4 0.84 07/02/2022   FREET4 3.25 (H) 01/01/2022   FREET4 1.33 06/21/2021    OTHER active problems discussed and review of systems   Past Medical History:  Diagnosis Date   Gum lesion  Biopsy done-benign   Hypercholesteremia    Hypothyroid    LGSIL (low grade squamous intraepithelial dysplasia) 2010   Migraines    OCD (obsessive compulsive disorder)    Osteoporosis 01/2016   T score -2.7    Past Surgical History:  Procedure Laterality Date   basal cell excised N/A    BREAST BIOPSY Right    Benign   BREAST LUMPECTOMY  2001   right-benign   CATARACT EXTRACTION  2005   Gum Biopsy     Benign-2019   IR ANGIO EXTERNAL CAROTID SEL EXT CAROTID UNI R MOD SED  11/20/2021   IR ANGIO INTRA EXTRACRAN SEL INTERNAL CAROTID BILAT MOD SED  11/20/2021   IR ANGIO VERTEBRAL SEL VERTEBRAL BILAT MOD SED  11/20/2021  `  Family History  Problem Relation Age of  Onset   Hypertension Mother    Heart disease Mother    Hypertension Father    Breast cancer Paternal Aunt        Age 5   Diabetes Neg Hx     Social History:  reports that she has never smoked. She has never used smokeless tobacco. She reports current alcohol use. She reports that she does not use drugs.  Allergies:  Allergies  Allergen Reactions   Ampicillin Rash    Allergies as of 11/19/2022       Reactions   Ampicillin Rash        Medication List        Accurate as of November 19, 2022 12:35 PM. If you have any questions, ask your nurse or doctor.          ALPRAZolam 0.25 MG tablet Commonly known as: XANAX Take 1 tablet (0.25 mg total) by mouth daily as needed for anxiety.   atorvastatin 20 MG tablet Commonly known as: LIPITOR TAKE 1 TABLET BY MOUTH EVERY DAY   clomiPRAMINE 75 MG capsule Commonly known as: ANAFRANIL Take 1 capsule (75 mg total) by mouth at bedtime.   fluvoxaMINE 100 MG tablet Commonly known as: LUVOX Take 0.5 tablets (50 mg total) by mouth 2 (two) times daily.   levothyroxine 112 MCG tablet Commonly known as: SYNTHROID Take 1 tablet (112 mcg total) by mouth daily.   methylphenidate 36 MG CR tablet Commonly known as: CONCERTA Take 2 tablets (72 mg total) by mouth daily.   methylphenidate 36 MG CR tablet Commonly known as: CONCERTA Take 2 tablets (72 mg total) by mouth daily.   OLANZapine 10 MG tablet Commonly known as: ZYPREXA Take 1 tablet (10 mg total) by mouth at bedtime.   OSCAL 500/200 D-3 PO Take by mouth.   propranolol 20 MG tablet Commonly known as: INDERAL TAKE 1-2 TABLETS BY MOUTH TWICE A DAY AS NEEDED FOR TREMORS   SUMAtriptan 100 MG tablet Commonly known as: IMITREX Take 100 mg by mouth daily as needed for migraine.   topiramate 100 MG tablet Commonly known as: TOPAMAX Take 1 tablet (100 mg total) by mouth 2 (two) times daily.   VITAMIN D PO Take by mouth.         Review of  Systems   HYPERCHOLESTEROLEMIA: She has been on Lipitor 20 mg for several years  LDL usually at target, now being followed by PCP also She has no other risk factors besides hypercholesterolemia Follows low-fat diet  Lab Results  Component Value Date   CHOL 167 01/01/2022   HDL 73.20 01/01/2022   LDLCALC 80 01/01/2022   TRIG 70.0 01/01/2022   CHOLHDL 2  01/01/2022    GLUCOSE: Her fasting glucose has occasionally been high in the past Last glucose 91  A1c is variable, higher than expected  No family history of diabetes  Lab Results  Component Value Date   HGBA1C 6.3 07/02/2022   HGBA1C 6.0 01/01/2022   HGBA1C 5.5 05/02/2020   Lab Results  Component Value Date   LDLCALC 80 01/01/2022   CREATININE 0.85 07/02/2022    BP Readings from Last 3 Encounters:  11/19/22 94/60  07/10/22 99/67  07/04/22 112/70     PHYSICAL EXAM:  BP 94/60   Pulse 71   Ht 5\' 3"  (1.6 m)   Wt 123 lb 6.4 oz (56 kg)   LMP 11/19/2010   SpO2 99%   BMI 21.86 kg/m   Thyroid not palpable   ASSESSMENT:    HYPOTHYROIDISM:   She has longstanding primary hypothyroidism with a small goiter, likely from Hashimoto's thyroiditis  She is having some somnolence which is likely unrelated to her hypothyroidism TSH is normal This is despite her going back on 0.1 mg Synthroid instead of continuing the 112 mcg prescribed on the last visit Has been regular with this as before   Osteoporosis: Has tolerated Reclast as of 8/23   PLAN:  She will continue 0.1 mg of Synthroid as before  Follow-up in 36-month  Will reschedule Reclast in August again   September 11/19/2022, 12:35 PM

## 2022-11-25 ENCOUNTER — Ambulatory Visit (INDEPENDENT_AMBULATORY_CARE_PROVIDER_SITE_OTHER): Payer: BC Managed Care – PPO | Admitting: Psychiatry

## 2022-11-25 DIAGNOSIS — F4010 Social phobia, unspecified: Secondary | ICD-10-CM

## 2022-11-25 DIAGNOSIS — Z634 Disappearance and death of family member: Secondary | ICD-10-CM | POA: Diagnosis not present

## 2022-11-25 DIAGNOSIS — F422 Mixed obsessional thoughts and acts: Secondary | ICD-10-CM | POA: Diagnosis not present

## 2022-11-25 DIAGNOSIS — F330 Major depressive disorder, recurrent, mild: Secondary | ICD-10-CM | POA: Diagnosis not present

## 2022-11-25 NOTE — Progress Notes (Signed)
Psychotherapy Progress Note [facility] [name], PhD LP  Patient ID: OLIE SCAFFIDI)    MRN: [id number] Therapy format: Individual psychotherapy Date: 11/25/2022      Start: 2:19p     Stop: 3:06p     Time Spent: 47 min Location: In-person   Session narrative (presenting needs, interim history, self-report of stressors and symptoms, applications of prior therapy, status changes, and interventions made in session) Unusual midday appointment, but says she is able to make it and not have to get back to work today.  Focal issue is quandary over her relationship.  Says [name] is determinedly moving ahead, now looking into getting an annulment so he can be marriageable in the [employer], so she would not have to face the dilemma of being barred from the sacraments marrying a divorcee.  Obviously he is quite dedicated to the prospect of a life with her.  Still in a quandary whether she "loves" him, but as before, she does enjoy his company, has no complaints about behavior or personality, does miss him when unable to see him, and feels entirely well-regarded and safe with him.  Plagued with doubt that, if she feels like she could live without him, it means she doesn't love him enough.    Reframed that she has had much practice living on her own in 40 years as an adult, so she should by now feel capable of living on her own; that much makes her both competent as a single and reliable as a partner.  The issue now is whether to accommodate to living together as partners.  Reviewed interpretation that she also has several factors lined up to suppress feeling head over heels (medication, neurological changes from her prolonged severe depression, emotional dampening from for feeling traumatized by [facility] breaking up with her), and that quite likely it is a well-learned partial repression that is protecting her from the potential to feel heartbreak.  Further interpreted that she  is probably gauging her feelings unfairly, faulting herself for not having adolescent angst about a romantic relationship when she is not an adolescent, and feeling you can't live without someone really isn't the measure of whether you can live with them; to buy into that might very well be immature, actually, and more prone to failure.  Meanwhile, being sexually abstinent creates the great doubt whether she will be sexually competent after they do allow themselves to be intimate, but assured she will not have lost the ability to enjoy as she once did, she'll just be a bit older, so there may be aspects of intimacy that require some more help.  But no question the man she describes in [place] will be unfailingly kind, patient, willing to help her through any awkwardness, and is already devoted to her without the prospect of sexual satisfaction.  Adding sex to it can only be richer.  Since last met, has had to put down Baxter, her 15yo cat.  [name] went with her, full support.     Therapeutic modalities: Cognitive Behavioral Therapy, Solution-Oriented/Positive Psychology, and Ego-Supportive  Mental Status/Observations:  Appearance:   Casual     Behavior:  Appropriate  Motor:  Normal  Speech/Language:   Clear and Coherent  Affect:  Appropriate  Mood:  anxious  Thought process:  normal  Thought content:    Obsessions  Sensory/Perceptual disturbances:    WNL  Orientation:  Fully oriented  Attention:  Good    Concentration:  Good  Memory:  WNL  Insight:    Good  Judgment:   Good  Impulse Control:  Good   Risk Assessment: Danger to Self: No Self-injurious Behavior: No Danger to Others: No Physical Aggression / Violence: No Duty to Warn: No Access to Firearms a concern: No  Assessment of progress:  progressing  Diagnosis:   ICD-10-CM   1. Social anxiety disorder  F40.10     2. Mixed obsessional thoughts and acts  F42.2     3. 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  Bereavement  Z63.4      Plan:  For social anxiety and OCD attached to relationship -- experiment further in letting [name] do things at the house, accompany her to things, and repeat and progress in letting him see feet and other aspects of her body which may be ego dystonic to her.  Practice letting it be strange but true that she commends the attention and interest of a suitable man.  Practice letting it not have to be the same feeling she had with [name] to be legitimate.  Self-affirm that being capable of living by herself is an asset, not cause for doubt, and that strong, adolescent feelings of being in love are both more difficult at her age and suppressed by a range of things, while  her relationship with [name] has all the behavioral  hallmarks of being very strong and good.  The rest is about accommodating a partnership being so used to living alone. Work -- Although worries about making mistakes on the job are much lower did now, look for opportunities to challenge her worrying mind by coming up with mistakes that she can either imagine or "fake", and in any case look for a way to laugh about her own mind and intrusive worries.  For any feeling underutilized, recommend asking boss -- at interest -- for additional duties in slow weeks to make job more fulfilling and ego-syntonic. Physiological self-care -- Maintain vitamin supplements, include omega-3 for anti-inflammatory benefits and brain health.  Work with timing on medication adjustments to maximize rest at night and alertness by day.  Lip tremor and psychomotor retardation, along with reported fatigue, could signal nutritional deficiencies, e.g., B1, B6, B12, D, E, or a medication-related issue -- work with psychiatry as needed. Pet loss -- [place] as see fit, option to contact veterinary bereavement warm lines Other recommendations/advice as may be noted above Continue to utilize previously learned skills ad lib Maintain medication as  prescribed and work faithfully with relevant prescriber(s) if any changes are desired or seem indicated Call the clinic on-call service, 988/hotline, 911, or present to [facility] or ER if any life-threatening psychiatric crisis Return for as already scheduled. Already scheduled visit in this office 04/02/2023.  Robley Fries, PhD [name], PhD LP Clinical Psychologist, [facility] [address], [address] [place], [place] [address] [phone]

## 2022-11-26 ENCOUNTER — Other Ambulatory Visit: Payer: Self-pay

## 2022-11-26 MED ORDER — METHYLPHENIDATE HCL ER (OSM) 36 MG PO TBCR: 72.0000 mg | EXTENDED_RELEASE_TABLET | Freq: Every day | ORAL | 0 refills | Status: DC

## 2022-12-04 DIAGNOSIS — L814 Other melanin hyperpigmentation: Secondary | ICD-10-CM | POA: Diagnosis not present

## 2022-12-04 DIAGNOSIS — Z08 Encounter for follow-up examination after completed treatment for malignant neoplasm: Secondary | ICD-10-CM | POA: Diagnosis not present

## 2022-12-04 DIAGNOSIS — L821 Other seborrheic keratosis: Secondary | ICD-10-CM | POA: Diagnosis not present

## 2022-12-04 DIAGNOSIS — L820 Inflamed seborrheic keratosis: Secondary | ICD-10-CM | POA: Diagnosis not present

## 2022-12-04 DIAGNOSIS — Z789 Other specified health status: Secondary | ICD-10-CM | POA: Diagnosis not present

## 2022-12-04 DIAGNOSIS — D1801 Hemangioma of skin and subcutaneous tissue: Secondary | ICD-10-CM | POA: Diagnosis not present

## 2022-12-04 DIAGNOSIS — L538 Other specified erythematous conditions: Secondary | ICD-10-CM | POA: Diagnosis not present

## 2022-12-04 DIAGNOSIS — L298 Other pruritus: Secondary | ICD-10-CM | POA: Diagnosis not present

## 2022-12-30 ENCOUNTER — Other Ambulatory Visit: Payer: Self-pay | Admitting: Psychiatry

## 2022-12-30 ENCOUNTER — Other Ambulatory Visit: Payer: Self-pay

## 2022-12-30 MED ORDER — METHYLPHENIDATE HCL ER (OSM) 36 MG PO TBCR: 72.0000 mg | EXTENDED_RELEASE_TABLET | Freq: Every day | ORAL | 0 refills | Status: DC

## 2023-01-10 IMAGING — CT CT ANGIO HEAD
2 of 4 series · 6 of 30 positions shown · IV contrast (iopamidol)
Comparison: Head MRI 07/13/2021

CLINICAL DATA: Pulsatile tinnitus of left ear.

Creatinine was obtained on site at [HOSPITAL] at [HOSPITAL].
Results: Creatinine 0.8 mg/dL.
EXAM:
CT ANGIOGRAPHY HEAD
TECHNIQUE: Multidetector CT imaging of the head was performed using the
standard protocol during bolus administration of intravenous
contrast. Multiplanar CT image reconstructions and MIPs were
obtained to evaluate the vascular anatomy.
CONTRAST:  75mL 6UVCLI-A7A IOPAMIDOL (6UVCLI-A7A) INJECTION 76%

[Series 3: head w/(date) · axial · 0.42mm/px · z∈[-145,-90]mm · 2 of 33 slices shown]
[im 11/33  brain]
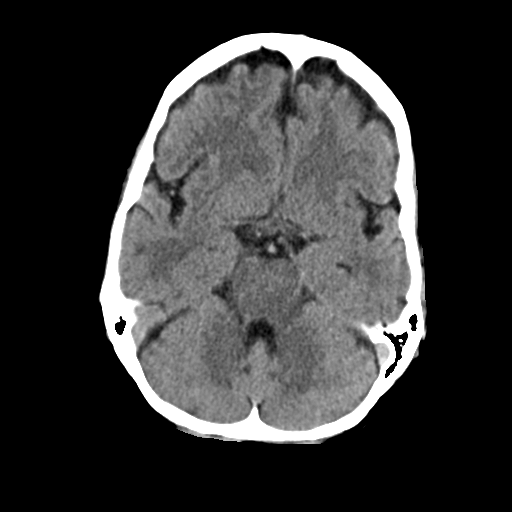
[im 22/33  brain]
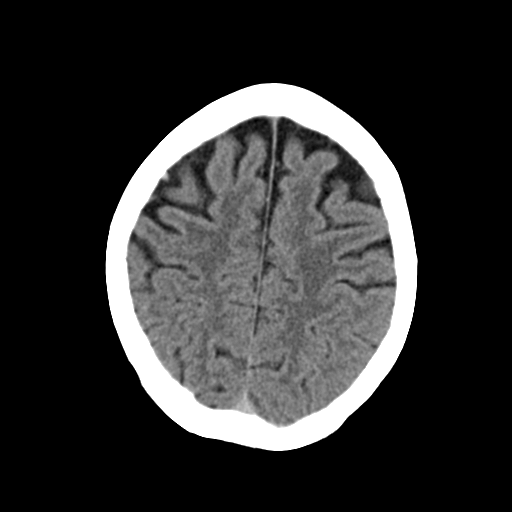

[Series 6: head angio · axial · 0.42mm/px · z∈[-164,-65]mm · 4 of 55 slices shown]
[im 11/55  brain]
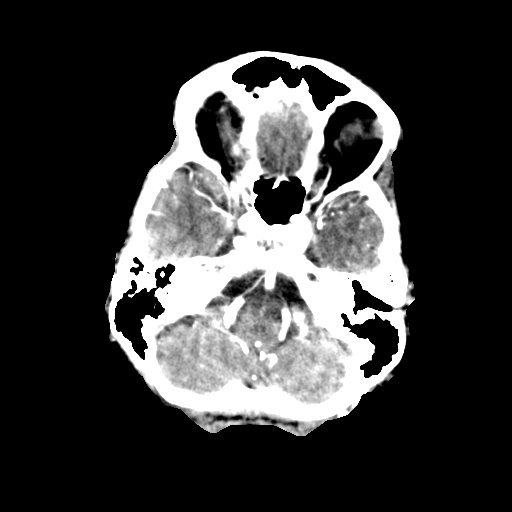
[im 22/55  bone]
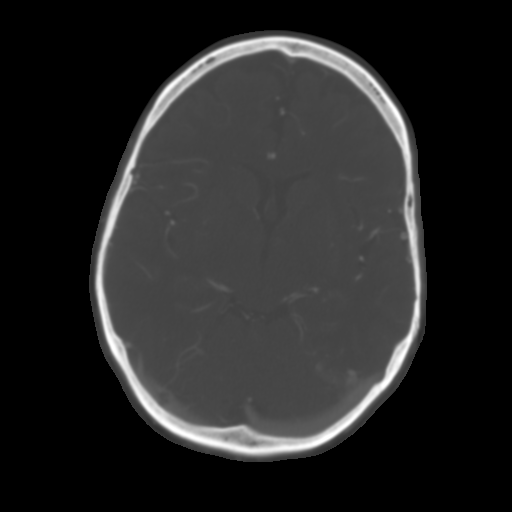
[im 33/55  brain]
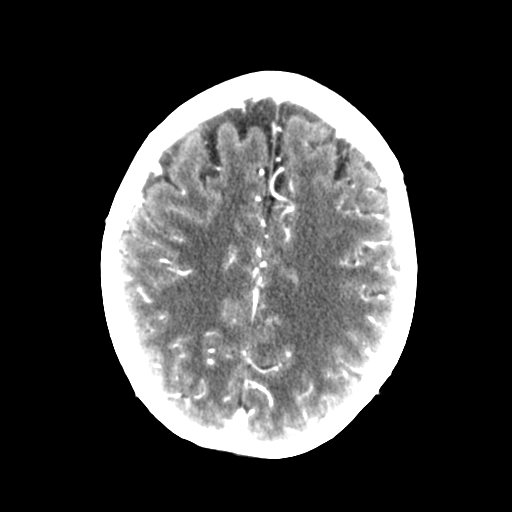
[im 44/55  bone]
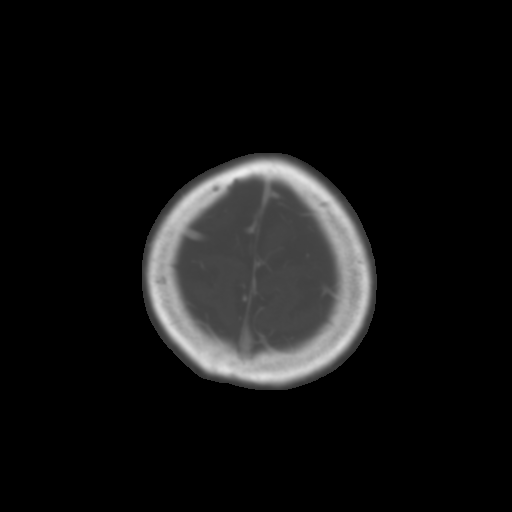

[6 of 30 positions shown; findings below may reference images not displayed]

FINDINGS: CT HEAD

Brain: There is no evidence of an acute infarct, intracranial
hemorrhage, mass, midline shift, or extra-axial fluid collection.
The ventricles are normal in size. Mild prominence of the
extra-axial CSF spaces over the frontal convexities is compatible
with mild cerebral atrophy.

Vascular: No hyperdense vessel.

Skull: No fracture or suspicious osseous lesion.

Sinuses: Visualized paranasal sinuses and mastoid air cells are
clear.

Other: Bilateral cataract extraction.

CTA HEAD

Anterior circulation: The internal carotid arteries are widely
patent from skull base to carotid termini. ACAs and MCAs are patent
without evidence of a proximal branch occlusion or significant
proximal stenosis. No aneurysm is identified.

Posterior circulation: There is a caliber change of the distal left
vertebral artery in the neck with the distal V3 segment being mild
to moderately narrowed diffusely (series 12, image 10), and there
are numerous prominent veins surrounding the vertebral artery and
more posteriorly at the skull base which demonstrate arterial
enhancement which is asymmetric compared to the contralateral side.
The intracranial vertebral arteries are widely patent to the basilar
with the right being dominant. Patent PICA and SCA origins are seen
bilaterally. The basilar artery is widely patent. There are left
larger than right posterior communicating arteries. Both PCAs are
patent without evidence of a significant proximal stenosis. No
aneurysm is identified.

Venous sinuses: As permitted by contrast timing, patent.

Anatomic variants: None.

Review of the MIP images confirms the above findings.
IMPRESSION: 1. Diffuse narrowing of the distal V3 segment of the left vertebral
artery with arterial enhancement of multiple adjacent veins
concerning for an AV fistula. A cerebral angiogram is recommended
for further evaluation.
2. Widely patent intracranial anterior and posterior circulation
without a significant stenosis or aneurysm.
3. No evidence of acute intracranial abnormality.

## 2023-01-14 ENCOUNTER — Ambulatory Visit: Payer: BC Managed Care – PPO | Admitting: Psychiatry

## 2023-01-15 DIAGNOSIS — Z1231 Encounter for screening mammogram for malignant neoplasm of breast: Secondary | ICD-10-CM | POA: Diagnosis not present

## 2023-01-17 ENCOUNTER — Ambulatory Visit (INDEPENDENT_AMBULATORY_CARE_PROVIDER_SITE_OTHER): Payer: BC Managed Care – PPO | Admitting: Psychiatry

## 2023-01-17 DIAGNOSIS — Z634 Disappearance and death of family member: Secondary | ICD-10-CM

## 2023-01-17 DIAGNOSIS — F4010 Social phobia, unspecified: Secondary | ICD-10-CM | POA: Diagnosis not present

## 2023-01-17 DIAGNOSIS — F422 Mixed obsessional thoughts and acts: Secondary | ICD-10-CM | POA: Diagnosis not present

## 2023-01-17 DIAGNOSIS — F330 Major depressive disorder, recurrent, mild: Secondary | ICD-10-CM

## 2023-01-17 NOTE — Progress Notes (Signed)
Psychotherapy Progress Note [facility] [name], PhD LP  Patient ID: Ashlee Chavez)    MRN: [id number] Therapy format: Individual psychotherapy Date: 01/17/2023      Start: 8:12a     Stop: 9:00a     Time Spent: 48 min Location: In-person   Session narrative (presenting needs, interim history, self-report of stressors and symptoms, applications of prior therapy, status changes, and interventions made in session) Day off today.  [name] visiting, working on getting an annulment so [employer] can marry them and she can continue to receive sacraments.  Not formally engaged, as annulment is likely a 2-year process.  More reconciled to having a future together.  Finding now she can get afraid of living with someone married, after all these years alone.  Affirmed and encouraged.  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  Finding serendipitous exposure therapy in encountering technical glitches failing to save her work, then a balance not reconciling, and staying uncommonly calm through the whole thing.  Proud of herself.  Working through a [employer] with a former client covered by confusing [facility] process.  Got one element figured out despite not understanding [employer].  Helps with morale.  Boss unfailingly kind, and learning to trust that over [employer]", her inner critic.  Refreshed the idea of "[name]" as a misguided friend, not an enemy, and the task being to acknowledge and hear from other "voices".  Still misses Baxter hard.  Friends say to get a new cat, but concerned she would get an aloof cat instead of a warm one like Baxter.  It's also too soon, too forward to do that.  [name] brings his cats, one warmer, the other more aloof, and the warm one is some comfort to her.  Affirmed and encouraged.  In  other exposure, she's been getting pedicures, which remain part anxious exposure for her, but then she reassures herself.  Another idea today to poke out of her office and inquire about it when she hears coworkers laughing, as a way of joining more socially.  Therapeutic modalities: Cognitive Behavioral Therapy, Solution-Oriented/Positive Psychology, and Ego-Supportive  Mental Status/Observations:  Appearance:   Casual     Behavior:  Appropriate  Motor:  Normal  Speech/Language:   Clear and Coherent  Affect:  Appropriate  Mood:  anxious  Thought process:  normal  Thought content:    WNL, obsessions  Sensory/Perceptual disturbances:    WNL  Orientation:  Fully oriented  Attention:  Good    Concentration:  Good  Memory:  WNL  Insight:    Good  Judgment:   Good  Impulse Control:  Good   Risk Assessment: Danger to Self: No Self-injurious Behavior: No Danger to Others: No Physical Aggression / Violence: No Duty to Warn: No Access to Firearms a concern: No  Assessment of progress:  progressing  Diagnosis:   ICD-10-CM   1. Mixed obsessional thoughts and acts  F42.2     2. Social anxiety disorder  F40.10     3. 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 Bereavement  Z63.4      Plan:  For social anxiety and OCD attached to relationship -- Experiment further in letting [name] do things at the house, accompany her to things, and repeat and progress in letting him see feet and  other aspects of her body which may be ego dystonic to her.  Practice letting it be strange but true that she commends the attention and interest of a suitable man.  Practice letting it not have to be the same feeling she had with [name] to be legitimate.  Self-affirm that being capable of living by herself is an asset, not cause for doubt, and that strong, adolescent feelings of being in love are both more difficult at her age and suppressed by a range of things, while  her relationship with [name] has  all the behavioral  hallmarks of being very strong and good.  The rest is about accommodating a partnership being so used to living alone. Work OCD -- Although worries about making mistakes on the job are much lower now, look for opportunities to challenge her worrying mind by coming up with mistakes that she can either imagine or "fake", and in any case look for a way to laugh about her own mind and intrusive worries.  For any feeling underutilized, recommend asking boss -- at interest -- for additional duties in slow weeks to make job more fulfilling and ego-syntonic. CBT/SFT tactics for worry/anxiety/OCD -- Conceive of worrying voice as "[name]", her inner critic who is less of an enemy than an overworried friend.  Continue to seek serendipitous exposure opportunities to engage or let herself show and notice how it works out. Physiological self-care -- Maintain vitamin supplements, include omega-3 for anti-inflammatory benefits and brain health.  Work with timing on medication adjustments to maximize rest at night and alertness by day.  Lip tremor and psychomotor retardation, along with reported fatigue, could signal nutritional deficiencies, e.g., B1, B6, B12, D, E, or a medication-related issue -- work with psychiatry as needed. Pet loss -- [place] as see fit, option to contact veterinary bereavement warm lines Other recommendations/advice as may be noted above Continue to utilize previously learned skills ad lib Maintain medication as prescribed and work faithfully with relevant prescriber(s) if any changes are desired or seem indicated Call the clinic on-call service, 988/hotline, 911, or present to [facility] or ER if any life-threatening psychiatric crisis Return in about 6 weeks (around 02/28/2023). Already scheduled visit in this office 04/02/2023.  Robley Fries, PhD [name], PhD LP Clinical Psychologist, [facility] [address]  [address] [place], [place] [address] [phone]

## 2023-01-25 ENCOUNTER — Other Ambulatory Visit: Payer: Self-pay | Admitting: Psychiatry

## 2023-01-25 DIAGNOSIS — R251 Tremor, unspecified: Secondary | ICD-10-CM

## 2023-01-27 ENCOUNTER — Other Ambulatory Visit: Payer: Self-pay | Admitting: Psychiatry

## 2023-01-27 DIAGNOSIS — G43009 Migraine without aura, not intractable, without status migrainosus: Secondary | ICD-10-CM

## 2023-02-22 ENCOUNTER — Other Ambulatory Visit: Payer: Self-pay | Admitting: Psychiatry

## 2023-02-22 DIAGNOSIS — R251 Tremor, unspecified: Secondary | ICD-10-CM

## 2023-02-24 ENCOUNTER — Ambulatory Visit (INDEPENDENT_AMBULATORY_CARE_PROVIDER_SITE_OTHER): Payer: BC Managed Care – PPO | Admitting: Nurse Practitioner

## 2023-02-24 ENCOUNTER — Encounter: Payer: Self-pay | Admitting: Nurse Practitioner

## 2023-02-24 ENCOUNTER — Other Ambulatory Visit (HOSPITAL_COMMUNITY)
Admission: RE | Admit: 2023-02-24 | Discharge: 2023-02-24 | Disposition: A | Discharge status: Home / Self Care | Payer: BC Managed Care – PPO | Source: Ambulatory Visit | Attending: Nurse Practitioner | Admitting: Nurse Practitioner

## 2023-02-24 VITALS — BP 90/56 | HR 82 | Resp 18 | Ht 62.4 in | Wt 120.6 lb

## 2023-02-24 DIAGNOSIS — Z124 Encounter for screening for malignant neoplasm of cervix: Secondary | ICD-10-CM | POA: Diagnosis not present

## 2023-02-24 DIAGNOSIS — M810 Age-related osteoporosis without current pathological fracture: Secondary | ICD-10-CM

## 2023-02-24 DIAGNOSIS — Z01419 Encounter for gynecological examination (general) (routine) without abnormal findings: Secondary | ICD-10-CM

## 2023-02-24 DIAGNOSIS — Z780 Asymptomatic menopausal state: Secondary | ICD-10-CM

## 2023-02-24 NOTE — Progress Notes (Signed)
   Ashlee Chavez Apr 21, 1961 [id number]   History:  62 y.o. G0 presents for annual exam. Postmenopausal - no HRT, no bleeding. 2010 LGSIL. Hypothyroidism and osteoporosis managed by endocrinology. Stopped Actonel 10/2021, started infusion a year ago. OCD, GAD, MDD managed by psychiatry.   Gynecologic History Patient's last menstrual period was 11/19/2010.   Contraception/Family planning: post menopausal status Sexually active: No  Health Maintenance Last Pap: 02/04/2020. Results were: Normal, 3-year repeat Last mammogram: 12/2022. Results were: Normal per patient Last colonoscopy: 11/2022. Results were: Normal per patient Last Dexa: 03/13/2022. Results were: T-score -2.6  Past medical history, past surgical history, family history and social history were all reviewed and documented in the EPIC chart. Boyfriend of 1.5 years. Manager at [employer].   ROS:  A ROS was performed and pertinent positives and negatives are included.  Exam:  Vitals:   02/24/23 1452  BP: (!) 90/56  Pulse: 82  Resp: 18  SpO2: 96%  Weight: 120 lb 9.6 oz (54.7 kg)  Height: 5' 2.4" (1.585 m)     Body mass index is 21.77 kg/m.  General appearance:  Normal Thyroid:  Symmetrical, normal in size, without palpable masses or nodularity. Respiratory  Auscultation:  Clear without wheezing or rhonchi Cardiovascular  Auscultation:  Regular rate, without rubs, murmurs or gallops  Edema/varicosities:  Not grossly evident Abdominal  Soft,nontender, without masses, guarding or rebound.  Liver/spleen:  No organomegaly noted  Hernia:  None appreciated  Skin  Inspection:  Grossly normal   Breasts: Examined lying and sitting.   Right: Without masses, retractions, discharge or axillary adenopathy.   Left: Without masses, retractions, discharge or axillary adenopathy. Genitourinary   Inguinal/mons:  Normal without inguinal adenopathy  External genitalia:  Normal appearing vulva with no masses, tenderness, or  lesions  BUS/Urethra/Skene's glands:  Normal  Vagina:  Normal appearing with normal color and discharge, no lesions. Atrophic changes  Cervix:  Normal appearing without discharge or lesions  Uterus:  Normal in size, shape and contour.  Midline and mobile, nontender  Adnexa/parametria:     Rt: Normal in size, without masses or tenderness.   Lt: Normal in size, without masses or tenderness.  Anus and perineum: Normal  Digital rectal exam: Deferred  Patient informed chaperone available to be present for breast and pelvic exam. Patient has requested no chaperone to be present. Patient has been advised what will be completed during breast and pelvic exam.   Assessment/Plan:  62 y.o. G0 for annual exam.   Well female exam with routine gynecological exam - Education provided on SBEs, importance of preventative screenings, current guidelines, high calcium diet, regular exercise, and multivitamin daily. Labs with PCP and endocrinology.   Screening for cervical cancer - Plan: Cytology - PAP( ). 2010 LGSIL.   Postmenopausal - no HRT, no bleeding  Age-related osteoporosis without current pathological fracture - Managed by endocrinology. 03/2022 T-score -2.6. Stopped Actonel 10/2021, started infusion a year ago.   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 Continue annual screenings.  Normal breast exam today.  Screening for colon cancer - 11/2022 colonoscopy. Will repeat at 10-year interval per GI recommendation.   Return in 1 year for annual.    Olivia Mackie DNP, 3:00 PM 02/24/2023

## 2023-02-26 LAB — CYTOLOGY - PAP: Diagnosis: NEGATIVE

## 2023-03-04 ENCOUNTER — Ambulatory Visit (INDEPENDENT_AMBULATORY_CARE_PROVIDER_SITE_OTHER): Payer: BC Managed Care – PPO | Admitting: Psychiatry

## 2023-03-04 DIAGNOSIS — F422 Mixed obsessional thoughts and acts: Secondary | ICD-10-CM

## 2023-03-04 DIAGNOSIS — F330 Major depressive disorder, recurrent, mild: Secondary | ICD-10-CM

## 2023-03-04 DIAGNOSIS — R69 Illness, unspecified: Secondary | ICD-10-CM

## 2023-03-04 DIAGNOSIS — F4010 Social phobia, unspecified: Secondary | ICD-10-CM

## 2023-03-04 NOTE — Progress Notes (Signed)
Psychotherapy Progress Note [facility] Ashlee Czar, PhD LP  Patient ID: Ashlee Chavez)    MRN: [id number] Therapy format: Individual psychotherapy Date: 03/04/2023      Start: 8:15a     Stop: 8:55a     Time Spent: 40 min Location: In-person   Session narrative (presenting needs, interim history, self-report of stressors and symptoms, applications of prior therapy, status changes, and interventions made in session) Working on [employer], latest development being a change of jurisdiction, since he and his ex both live in [place] area.  Both remaining patient.  Ashlee Chavez goes back and forth between sanguine and shocky about talking marriage.  Probed her experience of parents' marriage and compared (favorably) with her experience with Ashlee Chavez so far, confirming that he has seen him be unfailingly kind and adaptable when she has wanted something other than what he seemed to.  Still difficult to envision living together, and nagging doubt about whether she'll be able to cope with having full-time partner after getting so used to living alone.  Identifies as a discomfort zone watching TV together and him talking to her, admits he has not asked him to change that, she just puts up with it silently.  Identified two areas for growth, then -- for her to speak up about a strong preference (e.g., able to focus during a show) and for her to solicit more from him what sorts of things would be his "dealbreakers", or at least strong preferences.    Also noted continuing difficulty saying "I love you", reframed as a learned aversion.  She told Ashlee Chavez often, and the way it turned out was heartbreaking; interpreted both the "loved and lost" aspect of that and the tendency for OCD ("Ashlee Chavez") to read it as "magic words" which, before that were supposed to work to guarantee happiness, and after that might seem to be "curse words" still with the magic power to make her unhappy.  Encouraged to  recognize they are neither, that expressions of love come in many forms besides those words, and that many other couples who have learned each other and become comfortable don't say them and do just fine.  Work continues to get anxious about hypothetical mistakes and high-value tasks, but boss is unfailingly kind about them.  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  Affirmed and encouraged continuing natural exposure and accepting that she has learned her work and the boss is kind.  Amusing story of boss giving her a large but puzzling check for safekeeping then discovering it 2 months later.  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  Intrusive thought that she is gold-digging because of his offer, but clearly false, as she has socked away more than enough in retirement to support herself through a normal life expectancy and was just a few years ago looking at retiring, solo and early, at [employer].  Therapeutic modalities: Cognitive Behavioral Therapy, Solution-Oriented/Positive Psychology, and Assertiveness/Communication  Mental Status/Observations:  Appearance:   Casual and Neat     Behavior:  Appropriate  Motor:  Normal  Speech/Language:   Clear and Coherent  Affect:  Appropriate  Mood:  anxious  Thought process:  normal  Thought content:    Obsessions  Sensory/Perceptual disturbances:    WNL  Orientation:  Fully oriented  Attention:  Good    Concentration:  Good  Memory:  WNL  Insight:    Good  Judgment:   Good  Impulse Control:  Good   Risk Assessment: Danger to Self: No Self-injurious Behavior: No Danger to Others: No Physical Aggression / Violence: No Duty to Warn: No Access to Firearms a concern: No  Assessment of progress:  progressing  well  Diagnosis:   ICD-10-CM   1. Social anxiety disorder  F40.10     2. Mixed obsessional thoughts and acts  F42.2     3. 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 R/O B12/folate deficiencies, anemia, monoamine synthesis problem  R69      Plan:  For social anxiety and OCD attached to relationship -- Experiment further in letting Ashlee Picket do things at the house, accompany her to things, and repeat and progress in letting him see feet and other aspects of her body which may be ego dystonic to her.  Practice letting it be strange but true that she commends the attention and interest of a kind and suitable man.  Practice letting it not have to be the same feeling she had with Ashlee Chavez to be legitimate.  Self-affirm that being capable of living by herself is an asset, not cause for doubt, and that strong, adolescent feelings of being in love are both more difficult at her age and suppressed by a range of things, while  her relationship with Ashlee Chavez has all the behavioral  hallmarks of being very strong and good.  The rest is about accommodating a partnership being so used to living alone.  To break down "too good to be true" feelings, road-test conflict -- broach the subject of "dealbreakers" and strong preferences, solicit what Ashlee Chavez's would be, and work up assertive requests where needed (e.g., less talking during TV/movie). Work OCD -- Although worries about making mistakes on the job are much lower now, look for opportunities to challenge her worrying mind by coming up with mistakes that she can either imagine or "fake", and in any case look for a way to laugh about her own mind and intrusive worries.  For any feeling underutilized, recommend asking boss -- at interest -- for additional duties in slow weeks to make job more fulfilling and ego-syntonic. CBT/SFT tactics for worry/anxiety/OCD -- Conceive of worrying voice as "Ashlee Chavez", her inner critic who is less of an enemy than an overworried friend.   Continue to seek serendipitous exposure opportunities to engage or let herself show and notice how it works out. Physiological self-care -- Maintain vitamin supplements, include omega-3 for anti-inflammatory benefits and brain health.  Work with timing on medication adjustments to maximize rest at night and alertness by day.  Lip tremor and psychomotor retardation, along with reported fatigue, could signal nutritional deficiencies, e.g., B1, B6, B12, D, E, or a medication-related issue -- work with psychiatry as needed. Longterm bereavement -- seek to notice and accept all grief feelings when they come, no judgment Other recommendations/advice as may be noted above Continue to utilize previously learned skills ad lib Maintain medication as prescribed and work faithfully with relevant prescriber(s) if any changes are desired or seem indicated Call the clinic on-call service, 988/hotline, 911, or present to [facility] or ER if any life-threatening psychiatric crisis Return for time at discretion. Already scheduled visit in this office 04/02/2023 (med check)  Robley Fries, PhD Ashlee Czar, PhD LP Clinical  Psychologist, [facility] [address] [place], [place] [address] [phone]

## 2023-03-07 ENCOUNTER — Other Ambulatory Visit: Payer: Self-pay

## 2023-03-07 MED ORDER — METHYLPHENIDATE HCL ER (OSM) 36 MG PO TBCR: 72.0000 mg | EXTENDED_RELEASE_TABLET | Freq: Every day | ORAL | 0 refills | Status: DC

## 2023-03-21 ENCOUNTER — Other Ambulatory Visit: Payer: Self-pay | Admitting: Psychiatry

## 2023-03-21 DIAGNOSIS — R251 Tremor, unspecified: Secondary | ICD-10-CM

## 2023-04-02 ENCOUNTER — Ambulatory Visit: Payer: BC Managed Care – PPO | Admitting: Psychiatry

## 2023-04-02 ENCOUNTER — Encounter: Payer: Self-pay | Admitting: Psychiatry

## 2023-04-02 DIAGNOSIS — E638 Other specified nutritional deficiencies: Secondary | ICD-10-CM

## 2023-04-02 DIAGNOSIS — F332 Major depressive disorder, recurrent severe without psychotic features: Secondary | ICD-10-CM

## 2023-04-02 DIAGNOSIS — G43009 Migraine without aura, not intractable, without status migrainosus: Secondary | ICD-10-CM

## 2023-04-02 DIAGNOSIS — F900 Attention-deficit hyperactivity disorder, predominantly inattentive type: Secondary | ICD-10-CM

## 2023-04-02 DIAGNOSIS — F5102 Adjustment insomnia: Secondary | ICD-10-CM

## 2023-04-02 DIAGNOSIS — F4010 Social phobia, unspecified: Secondary | ICD-10-CM | POA: Diagnosis not present

## 2023-04-02 DIAGNOSIS — F422 Mixed obsessional thoughts and acts: Secondary | ICD-10-CM

## 2023-04-02 DIAGNOSIS — R251 Tremor, unspecified: Secondary | ICD-10-CM

## 2023-04-02 MED ORDER — PROPRANOLOL HCL 20 MG PO TABS
ORAL_TABLET | ORAL | 2 refills | Status: DC
Start: 2023-04-02 — End: 2023-07-15

## 2023-04-02 MED ORDER — FLUVOXAMINE MALEATE 100 MG PO TABS: 50.0000 mg | ORAL_TABLET | Freq: Two times a day (BID) | ORAL | 1 refills | Status: DC

## 2023-04-02 MED ORDER — OLANZAPINE 10 MG PO TABS
10.0000 mg | ORAL_TABLET | Freq: Every day | ORAL | 1 refills | Status: DC
Start: 2023-04-02 — End: 2023-09-28

## 2023-04-02 MED ORDER — CLOMIPRAMINE HCL 75 MG PO CAPS
75.0000 mg | ORAL_CAPSULE | Freq: Every day | ORAL | 1 refills | Status: DC
Start: 2023-04-02 — End: 2023-10-31

## 2023-04-02 MED ORDER — TOPIRAMATE 100 MG PO TABS: 100.0000 mg | ORAL_TABLET | Freq: Two times a day (BID) | ORAL | 0 refills | Status: DC

## 2023-04-02 MED ORDER — METHYLPHENIDATE HCL ER 36 MG PO TB24
72.0000 mg | ORAL_TABLET | Freq: Every day | ORAL | 0 refills | Status: DC
Start: 2023-04-02 — End: 2023-06-16

## 2023-04-02 MED ORDER — ALPRAZOLAM 0.25 MG PO TABS
0.2500 mg | ORAL_TABLET | Freq: Every day | ORAL | 0 refills | Status: AC | PRN
Start: 2023-04-02 — End: ?

## 2023-04-02 MED ORDER — METHYLPHENIDATE HCL ER (OSM) 36 MG PO TBCR
72.0000 mg | EXTENDED_RELEASE_TABLET | Freq: Every day | ORAL | 0 refills | Status: DC
Start: 2023-04-30 — End: 2023-06-16

## 2023-04-02 NOTE — Progress Notes (Signed)
Ashlee Chavez [id number] 1961-10-01 62 y.o.  Subjective:   Patient ID:  Ashlee Chavez is a 62 y.o. (DOB 09/02/1961) female.  Chief Complaint:  Chief Complaint  Patient presents with   Follow-up   Depression   Anxiety   ADD    Depression        Associated symptoms include headaches.  Associated symptoms include no decreased concentration, no appetite change and no suicidal ideas.  Past medical history includes anxiety.   Anxiety Symptoms include nervous/anxious behavior. Patient reports no confusion, decreased concentration, dizziness, palpitations or suicidal ideas.     Ashlee Chavez presents today for follow-up of recently worse depression DT relationship loss.    When seen February 10, 2019.  She had received some benefit from Ritalin 20 mg 3 times daily but was having issues with the crash.  We switched to Concerta 72 mg every morning.  September 14, 2019.  She was having persistent bothersome OCD and was interested in further medicine changes to try to help with these symptoms because she has aggressively pursued therapy and still has significant symptoms.  She has been on multiple SSRIs and so we initiated augmentation with risperidone half milligram daily to increase to 1 mg daily. Sig improvement with risperidone.  SE low appetite with it.  Not hungry for dinner. Sleep so much better also.  Improvement is "marked".  Think better bc less obsessing time.  Not constant now.  Noticed benefit within the first week.  Less obsessing lessened depression.  But residual symptoms.  visit October 14, 2019.  She was encouraged to experiment with the dosing of the risperidone up to 2 mg or 3 mg nightly to see if she could get additional reduction in her chronic anxiety and OCD.   seen December 15, 2019.  Settled at 2 mg risperidone bc increased sleep.  Sleep is so much better.  Harder to get up in the morning.   Found out insurance won't cover Concerta anymore.  Not as good on Ritalin TID.   Not as even a response.  Terrible insurance. Therefore the only med change with switching to Focalin to see if it would be covered better.  Appt 03/09/20, switch to Concerta 72 mg is better with better mood and motivation vs Focalin.  Focus is also better.  Very poor function with Focalin and better with Concerta.   Better overall with more even response to stimulant and better appetite.  No SE.  Duration all day.  Less desperate hopeless feelings on the stimulant.  Stressful times. With increase risperidone a little better mood bump.  SE a little shakiness noticed in jaw.   Not unusual levels of anxiety.  Lost her best friend 1 year ago and still grieving.  Not much family. Alone.  Not close to B and S who live elsewhere.  Talks to God daily.  Gets to work daily.  Function is good there.  Reads and calls people.  OCD no worse with Covid. Did join a Bible study which was a huge step. Plan start lamotrigine   04/04/20 appt with the following noted: Appt moved up.  She thought she was supposed to stop risperidone when started lamotrigine.  Started having SI off the risperidone but wasn't sure if it was SE.  No risperidone 3 mg for 3 weeks.Anxiety shaking sweating crying all started right away.  Also trouble with sleep and can't eat.  Plan:Restart risperidone 1 mg now and another milligram in 2 to 3 hours  if not feeling sufficient relief to get the full dose of 3 mg today.  Then resume 3 mg risperidone each evening. Restart lamotrigine 25 mg daily for 2 weeks and follow the previous instructions on increasing it.  05/11/2020 appointment the following is noted: Marked improvement back on risperidone 3 mg.  SE shakes jaw.  Doesn't affect typing. Started lamotrigine and up to 100 mg for 2 weeks.  Feels it's helped depression. Better mood and able to laugh.  Able to get pizza for Bible study and not cry. Apptetite is not great.  Working on it. Anxiety is better not gone. Plan: no med changes  07/26/20  appt with the following noted; Increased lamotrigine gradually to 200 mg daily over the phone 6 weeks ago.  No improvement really for depression. With it. CC depression.   Risperidone still helping anxiety. Plan: Increase lamotrigine to 300 mg daily..  09/14/2020 appointment with following noted: Started taper of lamotrigine DT NR. Started latuda 20 mg daily for 10 days.  Tolerated fine.  No effects so far.. Main problem is still depression.  Anxiety is not too bad.  No energy, motivation, sleeeping a lot.  No sig SI. In past started risperidone for anxiety and depression.  Increase Latuda to 40 mg daily. Disc potential DDI with risperidone reducing effectiveness of  Latuda so reduc risperidone to 1.5 mg HS  10/09/2020 phone call from patient: Patient left a voicemail on Friday while we were out of office for the Holiday that she is not any better. She feels the cutting in the 1/2 of Risperidone has really increased her anxiety. She was instructed to call back if not any better but concerned that taking away the Risperidone will really exacerbate her anxiety.  MD response:  At the last appointment we increased Latuda to 40 mg daily and reduced risperidone to half tablet daily.  The goal was to use Latuda and eliminate risperidone.  However since her anxiety has worsened with the reduction in risperidone, I agree with her and she should take the full dose of risperidone as well as continue the Latuda 40 mg daily.  The [name] is intended to help depression.  11/23/2020 appointment with the following noted: Propranolol only once daily. No better with higher Latuda.  No SE either. No change in appetite which is poor.  Still struggling with depression and OCD is a little worse with changes at work. Off lamotrigine. Plan: 5-HTP supplement 200 mg daily  Increase Latuda to 60 mg  for 3 weeks, If no benefit and no SE increase to 80 mg daily with food.  ADD still better with Concerta 72 mg versus  Focalin. Continue high dosage paroxetine 80 mg daily.  01/16/2021 appointment with following noted: Increase Latuda to 80 mg without benefit or SE.  Anxiety and depression with poor STM.  Food getting caught in her throat.  No change in eating.  No change in weight.   Anxious thoughts mostly about work even when not there.  Anxious about work needed on the house she can't afford.  Enjoys reading but feels guilty that she should be doing something else.  More general anxiety than OCD. 5-HTP supplement 200 mg daily Wean Latuda over a week DT NR Reduce mirtazapine to 30 mg HS  ADD still better with Concerta 72 mg versus Focalin. Start clomipramine 25 mg capsules 1 at night for 1 week, Then increase clomipramine to 2 capsules at night and reduce paroxetine to 1-1/2 tablets or 60 mg daily.  Wait 1 week and then get the blood test   03/15/2021 appt noted: OK not great.  About the same.  Not much change in the last 8 weeks. SE none Lab 02/20/21 clomipramine 50 + Luvox 100 = clomip 195 + norclomip 111= total 306 Losing weight and no appetite and hard to eat. Weight loss to 107#. A lot of anxiety and depression   Sleep less sound with less mirtazapine.  Plan: Due to the severity of symptoms,Add olanzapine 5 mg tablet 1 at night Stop mirtazapine Reduce risperidone to 1/2 tablet for 1 week then stop it.  Call if SI recurs.  03/30/2021 appointment with the following noted: It makes me a little tired but so far anxiety is improved.  No problems off risperidone.  Appetite is not better but did get protein bars and is eating.   Less panic.  But still occurs.  Things just started this week to be a little better.  Not huge but it's a start.  A little more energy. Sleep 10 hours but that's unchanged with olanzapine. First time I've had hope in a long time. Plan: A little better with olanzapine 5 mg HS vs risperidone 3 mg. Increase olanzapine to 10 mg HS  Call in 2 weeks if you are not seeing progressive  improvement  04/12/2021 appointment with the following noted: Better with anxiety.  Sleep better.  Daytime could nap if not busy.  Otherwise OK.  Not much change in appetite.  Up to 110# about 2.5# better and trying to get protein twice daily.   A little change in OCD.   Depression is better some and staying up later and doing more than she was.  Doing some things after dinner which is huge.  Big change with depression.  Not retreating to bed as quickly. Plan: continue meds.  05/18/21 appt noted: Further improvement in anxiety.  I feel better but still not going out but not excessively sleeping.  Acting more normally, eating better.  Eating dinner again.  Sleep reduced to 8 hours. SE dry mouth and a little hungrier. Asks about going up on olanzapine to further reduce anxiety. [name] and describes better from 10/10 to 4/10 for anxiety and depression down to 3/10.Marland Kitchen  Anxiety symptoms include: Excessive Worry, Obsessive Compulsive Symptoms:   Handwashing,,.  Pt reports no sleep issues. Pt reports that appetite is good. Pt reports that energy is good and down slightly. Concentration is no change. Suicidal thoughts:  denied by patient. Plan increase olanzapine to 15 mg HS DT partial benefit  06/22/21 appt noted: Too tired and sleepy with 15 mg olanzapine and reduced the dose to 10 mg daily. No other med changes. Overall about the same.  Don't do anything different.  Happy that it's better than it was stays up to 930 until 6 PM.  Don't go out except Bible study and church once weekly. Goes to grocery early to avoid people. Anxiety lately worse than the depression. Dx pulsatile tinnitus Plan: cut olanzapine back to 10 mg HS Increase clomipramine to 75 mg HS  08/13/2021 appointment with the following noted: Anxiety is "good" after increase clomipramine.  Used to be desperate to get home and not now.  Was thinking of disability and now it's better.  I texted a guy on  a dating site first time in 2 years.  It's a big deal.  Started exercising a little.   SE no worse with increase clomipramine. Depression is  better too and manageable with brief downturns.   Sleep is pretty good. Plan no med changes  10/30/2021 appt noted: Pretty good and a little better than last time.  Still getting out a little with a guy she met on dating site and it's going ok.  Still prefers to be home.  Still get anxious going out. Overall best response so far with any med for anxiety.   Depression is manageable. SE is OK Staying up more appropriately late and sleep is OK. Plan : Much better with clomipramine 75 HS Also continue olanzapine 10 HS & fluvoxamine 50 Benefit Concerta 72 mg a.m. Taking propranolol 20 BID for tremor No med changes  01/29/2022 appointment with the following noted: Pretty good and probably doing better. Handled triggers with less obsessions.  Had to have bx breast today and hasn't been obsessing like she would ususally.  Better than she ever expected. Surprising. Feels the med has hit it's stride at this point. No sig caffeine.  Tremor managed. Depresion managed. Concerta is fine without problems.  Not noticeable when it wears off. Sleep good lately. Yes increase fluvoxamine to 100 mg HS and 50 mg AM Repeat clomipramine level .    02/11/22 lab noted: She is now on fluvoxamine 150 mg daily and clomipramine 75 mg daily.  It is known that fluvoxamine can affect the blood level of clomipramine but that is not a problem in her case.  We increased the fluvoxamine at her last appointment on January 29, 2022 to the current dosage.  Let her know there is room to increase either fluvoxamine to 200 mg daily which can be split between morning and evening if desired or taken all at night if desired.  Or the other alternative if she prefers is to increase clomipramine to 100 mg at night.  Either 1 of those options may help further decrease anxiety and obsessions.  However if  she would prefer to keep the dosage the same that is okay as well. Meredith Staggers, MD, DFAPA  04/30/22 appt noted: Tried increasing clomipramine and then fluvoxamine and it made her too sleepy. Depression managed. Still a little OCD and anxiety but pretty much managed unless triggers. Getting out socially and dating which causes anxiety but doing OK. Tolerating meds ok now. Sleep 9-5.  Likes to nap but not usually. No SI in a long time. Plan: Could not tolerate fluvoxamine 100 or clomipramine 100 DT sleepiness Therefore continue fluvoxamine 50 mg twice daily and clomipramine 75 mg nightly Continue olanzapine 10 mg nightly ADD still better with Concerta 72 mg versus Focalin. 5-HTP supplement 200 mg daily No med changes today . . 10/01/2022 appointment noted: Continues same med.  Insurance won't pay for more than 1 tablet daily fluvoxamine. SE some tiredness but sleeps well. Manageable depression .  Anxiety better with meds but not gone.   Concerta still helping. Plan: Therefore continue fluvoxamine 50 mg twice daily and clomipramine 75 mg nightly Continue olanzapine 10 mg nightly ADD still better with Concerta 72 mg versus Focalin. Propranolol 40 AM Topomax 100 BID 5-HTP supplement 200 mg daily No med changes today  04/02/23 appt noted: Meds as above, takes Xanax 0.25 mg on Sun afternoons bc anxious about work the next day.  Helps. Still happiest at home on couch but forces herself to get out and it usually works out fine.   Dep is usually ok and not like it was before at all. Concerta ok for attention. SE tremor and nothing else.  Numerous failed psychiatric medication trials include Lexapro 60 mg a day, Zoloft 250, Paxil with weight gain,  Fluvoxamine 100 Clomipramine 50,  (Could not tolerate fluvoxamine 100 or clomipramine 100 DT sleepiness Therefore continue fluvoxamine 50 mg twice daily and clomipramine 75 mg nightly)  Wellbutrin 300 with side effects,   mirtazapine Rexulti which helped initially,  Vraylar, Abilify between 1 and 2.5 mg and up to 15 mg with no response,  Latuda 80 failed Risperidone Olanzapine 15 sedation. Lamotrigine 300 NR buspirone 30 mg,  Cytomel,  lithium 625,  pramipexole with insomnia,   Lunesta with crying spells,  topiramate, Deplin, propranolol,   Ritalin, Focalin NR, Concerta 72 helped No history of Prozac, Viibryd.  Review of Systems:  Review of Systems  Constitutional:  Negative for appetite change, diaphoresis and unexpected weight change.  HENT:  Negative for tinnitus.   Cardiovascular:  Negative for palpitations.  Neurological:  Positive for tremors and headaches. Negative for dizziness and light-headedness.       Mouth  Psychiatric/Behavioral:  Negative for agitation, behavioral problems, confusion, decreased concentration, hallucinations, self-injury, sleep disturbance and suicidal ideas. The patient is nervous/anxious. The patient is not hyperactive.     Medications: I have reviewed the patient's current medications.  Current Outpatient Medications  Medication Sig Dispense Refill   atorvastatin (LIPITOR) 20 MG tablet TAKE 1 TABLET BY MOUTH EVERY DAY 90 tablet 1   Calcium Carbonate-Vitamin D (OSCAL 500/200 D-3 PO) Take by mouth.     Cholecalciferol (VITAMIN D PO) Take by mouth.     levothyroxine (SYNTHROID) 100 MCG tablet Take 1 tablet (100 mcg total) by mouth daily. 90 tablet 3   methylphenidate 36 MG PO CR tablet Take 2 tablets (72 mg total) by mouth daily. 60 tablet 0   SUMAtriptan (IMITREX) 100 MG tablet Take 100 mg by mouth daily as needed for migraine.     ALPRAZolam (XANAX) 0.25 MG tablet Take 1 tablet (0.25 mg total) by mouth daily as needed for anxiety. 30 tablet 0   clomiPRAMINE (ANAFRANIL) 75 MG capsule Take 1 capsule (75 mg total) by mouth at bedtime. 90 capsule 1   fluvoxaMINE (LUVOX) 100 MG tablet Take 0.5 tablets (50 mg total) by mouth 2 (two) times daily. 90 tablet 1   [START  ON 04/30/2023] methylphenidate 36 MG PO CR tablet Take 2 tablets (72 mg total) by mouth daily. 60 tablet 0   OLANZapine (ZYPREXA) 10 MG tablet Take 1 tablet (10 mg total) by mouth at bedtime. 90 tablet 1   propranolol (INDERAL) 20 MG tablet TAKE 1-2 TABLETS BY MOUTH TWICE A DAY AS NEEDED FOR TREMORS 120 tablet 2   topiramate (TOPAMAX) 100 MG tablet Take 1 tablet (100 mg total) by mouth 2 (two) times daily. 180 tablet 0   No current facility-administered medications for this visit.    Medication Side Effects:  Watch jaw tremor  Allergies:  Allergies  Allergen Reactions   Ampicillin Rash    Past Medical History:  Diagnosis Date   Gum lesion    Biopsy done-benign   Hypercholesteremia    Hypothyroid    LGSIL (low grade squamous intraepithelial dysplasia) 2010   Migraines    OCD (obsessive compulsive disorder)    Osteoporosis 01/2016   T score -2.7    Family History  Problem Relation Age of Onset   Hypertension Mother    Heart disease Mother    Hypertension Father    Breast cancer Paternal Aunt        Age  70   Diabetes Neg Hx     Social History   Socioeconomic History   Marital status: Single    Spouse name: Not on file   Number of children: Not on file   Years of education: Not on file   Highest education level: Not on file  Occupational History   Not on file  Tobacco Use   Smoking status: Never   Smokeless tobacco: Never  Vaping Use   Vaping Use: Never used  Substance and Sexual Activity   Alcohol use: Yes    Alcohol/week: 0.0 standard drinks of alcohol    Comment: wine occassionally   Drug use: No   Sexual activity: Not Currently    Birth control/protection: Post-menopausal    Comment: 1st intercourse 89 yo-5 partners  Other Topics Concern   Not on file  Social History Narrative   Not on file   Social Determinants of Health   Financial Resource Strain: Not on file  Food Insecurity: Not on file  Transportation Needs: Not on file  Physical Activity:  Not on file  Stress: Not on file  Social Connections: Not on file  Intimate Partner Violence: Not on file    Past Medical History, Surgical history, Social history, and Family history were reviewed and updated as appropriate.   Please see review of systems for further details on the patient's review from today.   Objective:   Physical Exam:  LMP 11/19/2010   Physical Exam Constitutional:      General: She is not in acute distress.    Appearance: Normal appearance. She is well-developed.  Musculoskeletal:        General: No deformity.  Neurological:     Mental Status: She is alert and oriented to person, place, and time.     Motor: Tremor present.     Coordination: Coordination normal.     Comments: Mild mouth tremor  Psychiatric:        Attention and Perception: She is attentive. She does not perceive auditory hallucinations.        Mood and Affect: Mood is anxious. Mood is not depressed. Affect is not labile, blunt, angry or inappropriate.        Speech: Speech is not delayed.        Behavior: Behavior is not slowed.        Thought Content: Thought content is not paranoid or delusional. Thought content does not include homicidal or suicidal ideation. Thought content does not include suicidal plan.        Cognition and Memory: Cognition normal.        Judgment: Judgment normal.     Comments: Insight intact. No auditory or visual hallucinations. No delusions. Less checking. Gradually improving OCD Severe sx fimproved with both anxiety and depression.       Lab Review:     Component Value Date/Time   NA 141 07/02/2022 0806   K 3.9 07/02/2022 0806   CL 107 07/02/2022 0806   CO2 24 07/02/2022 0806   GLUCOSE 91 07/02/2022 0806   BUN 22 07/02/2022 0806   CREATININE 0.85 07/02/2022 0806   CALCIUM 9.7 07/02/2022 0806   PROT 6.9 07/02/2022 0806   ALBUMIN 4.2 07/02/2022 0806   AST 22 07/02/2022 0806   ALT 20 07/02/2022 0806   ALKPHOS 56 07/02/2022 0806   BILITOT 0.2  07/02/2022 0806   GFRNONAA >60 11/20/2021 0703       Component Value Date/Time   WBC 6.9 11/20/2021 0703  RBC 4.72 11/20/2021 0703   HGB 15.4 (H) 11/20/2021 0703   HCT 46.3 (H) 11/20/2021 0703   PLT 156 11/20/2021 0703   MCV 98.1 11/20/2021 0703   MCH 32.6 11/20/2021 0703   MCHC 33.3 11/20/2021 0703   RDW 13.1 11/20/2021 0703   LYMPHSABS 1.0 11/20/2021 0703   MONOABS 0.4 11/20/2021 0703   EOSABS 0.1 11/20/2021 0703   BASOSABS 0.0 11/20/2021 0703    No results found for: "POCLITH", "LITHIUM"   No results found for: "PHENYTOIN", "PHENOBARB", "VALPROATE", "CBMZ"   .res Assessment: Plan:    Amaal was seen today for follow-up, depression, anxiety and add.  Diagnoses and all orders for this visit:  Major depressive disorder, recurrent severe without psychotic features (HCC) -     OLANZapine (ZYPREXA) 10 MG tablet; Take 1 tablet (10 mg total) by mouth at bedtime.  Mixed obsessional thoughts and acts -     clomiPRAMINE (ANAFRANIL) 75 MG capsule; Take 1 capsule (75 mg total) by mouth at bedtime. -     OLANZapine (ZYPREXA) 10 MG tablet; Take 1 tablet (10 mg total) by mouth at bedtime.  Social anxiety disorder -     ALPRAZolam (XANAX) 0.25 MG tablet; Take 1 tablet (0.25 mg total) by mouth daily as needed for anxiety. -     clomiPRAMINE (ANAFRANIL) 75 MG capsule; Take 1 capsule (75 mg total) by mouth at bedtime. -     OLANZapine (ZYPREXA) 10 MG tablet; Take 1 tablet (10 mg total) by mouth at bedtime.  Attention deficit hyperactivity disorder (ADHD), predominantly inattentive type -     methylphenidate 36 MG PO CR tablet; Take 2 tablets (72 mg total) by mouth daily. -     methylphenidate 36 MG PO CR tablet; Take 2 tablets (72 mg total) by mouth daily.  Insomnia due to psychological stress  Tremor of face and hands -     propranolol (INDERAL) 20 MG tablet; TAKE 1-2 TABLETS BY MOUTH TWICE A DAY AS NEEDED FOR TREMORS  Imbalanced nutrition -     OLANZapine (ZYPREXA) 10 MG  tablet; Take 1 tablet (10 mg total) by mouth at bedtime.  Migraine without aura and without status migrainosus, not intractable -     topiramate (TOPAMAX) 100 MG tablet; Take 1 tablet (100 mg total) by mouth 2 (two) times daily.  Other orders -     fluvoxaMINE (LUVOX) 100 MG tablet; Take 0.5 tablets (50 mg total) by mouth 2 (two) times daily.   We discussed TRD and anxiety . she has a long list of failed psychiatric meds.  She has not tended to do well with higher dosages of SSRIs.  OCD handwashing & checking and dep under pretty good control. Overall she is satisfied with the current med regimen  Patient has been markedly depressed and anxious but, much better Since adding olanzapine 10 mg she has seen a significant improvement in depression and anxiety. Much better with clomipramine 75 HS  Chart review revealed that prior to starting treatment here patient had taken clomipramine in the past with some benefit but also with some side effects.  It is a tricyclic antidepressant distinctly different from SSRIs.  Some patients respond better to them.  She has a history of previous benefit.   We discussed the value of doing blood levels with tricyclic antidepressants like clomipramine.  She agrees.  Lab 02/20/21 clomipramine 50 + Luvox 100 = clomip 195 + norclomip 111= total 306 (normal 220-500) Better with Increase clomipramine to 75 mg  HS 02/11/2022 clomipramine level 183 history people, nor clomipramine level 89, total 272 on clomipramine 75 mg nightly and fluvoxamine 50 mg twice daily.  Disc dental risks and SE risks with meds.  Could not tolerate fluvoxamine 100 BID or clomipramine 100 DT sleepiness Therefore continue fluvoxamine 50 mg twice daily and clomipramine 75 mg nightly Continue olanzapine 10 mg nightly ADD still better with Concerta 72 mg versus Focalin. Continue propranolol 40 AM Ok prn Xanax on Sunday afternoon 5-HTP supplement 200 mg daily No med changes today and she  agrees. . . She is continuing psychotherapy with Dr. Farrel Demark  Discussed potential metabolic side effects associated with atypical antipsychotics, as well as potential risk for movement side effects. Advised pt to contact office if movement side effects occur.  AIMS 3  She agrees with the plan  Mouth tremor appears unchanged since the switch from risperidone to olanzapine and no worse and maybe better.  We will continue to monitor.  Follow-up in 6 mos  Meredith Staggers MD, DFAPA  Future Appointments  Date Time Provider Department Center  04/30/2023  9:00 AM Robley Fries, PhD [facility] None  05/16/2023  8:00 AM [facility] None  05/20/2023  8:15 AM Reather Littler, MD [facility] None    No orders of the defined types were placed in this encounter.      -------------------------------

## 2023-04-15 DIAGNOSIS — G43719 Chronic migraine without aura, intractable, without status migrainosus: Secondary | ICD-10-CM | POA: Diagnosis not present

## 2023-04-30 ENCOUNTER — Ambulatory Visit: Payer: BC Managed Care – PPO | Admitting: Psychiatry

## 2023-04-30 DIAGNOSIS — F4010 Social phobia, unspecified: Secondary | ICD-10-CM | POA: Diagnosis not present

## 2023-04-30 DIAGNOSIS — F330 Major depressive disorder, recurrent, mild: Secondary | ICD-10-CM

## 2023-04-30 DIAGNOSIS — F422 Mixed obsessional thoughts and acts: Secondary | ICD-10-CM

## 2023-04-30 NOTE — Progress Notes (Signed)
Psychotherapy Progress Note [facility] [name], PhD LP  Patient ID: Ashlee Chavez)    MRN: [id number] Therapy format: Individual psychotherapy Date: 04/30/2023      Start: 9:14a     Stop: 9:59a     Time Spent: 45 min Location: In-person   Session narrative (presenting needs, interim history, self-report of stressors and symptoms, applications of prior therapy, status changes, and interventions made in session) "OK", status quo.  Ashlee Chavez continues the long grind to get an annulment so that they can marry under [employer] sales [employer].  Still a long process, but he seems to have great patience.  Does not feel she's had particular practice asserting herself with him, but can recognize how there may be need to eventually.  Re prior concerns she may not love him sufficiently, it's getting easier to find she does, just the nagging doubts over having never had a married, fully cohabitating relationship and background self-doubt about herself.  Affirmed and encouraged, refreshed idea of calling her inner doubt voice "Ashlee Chavez".  Eventually reveals a new woman at work who is presenting a lot of urgency about various things, tends to dump miscellaneous problems on her, which are part of her job, but it was a lot, with excess urgency.   Also got given a set of collection calls to make, and unclaimed checks to research, both uncomfortable to her.  Discussed ways of responding and negotiating less pressure vs. tolerating it with less anxiety.  Overall doing reliably better.  Still wants to maintain sparing therapy schedule to save money, as she continues to save aggressively to maintain ACA eligibility.  Allowed discretion.  Therapeutic modalities: Cognitive Behavioral Therapy, Solution-Oriented/Positive Psychology, and Ego-Supportive  Mental Status/Observations:  Appearance:   Casual     Behavior:  Appropriate  Motor:  Normal  Speech/Language:   Clear and Coherent  Affect:   Appropriate  Mood:  anxious and less  Thought process:  normal  Thought content:    WNL and worry  Sensory/Perceptual disturbances:    WNL  Orientation:  Fully oriented  Attention:  Good    Concentration:  Good  Memory:  WNL  Insight:    Good  Judgment:   Good  Impulse Control:  Good   Risk Assessment: Danger to Self: No Self-injurious Behavior: No Danger to Others: No Physical Aggression / Violence: No Duty to Warn: No Access to Firearms a concern: No  Assessment of progress:  progressing  Diagnosis:   ICD-10-CM   1. 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 Mixed obsessional thoughts and acts  F42.2     3. Social anxiety disorder  F40.10      Plan:  For social anxiety and OCD attached to relationship -- Experiment further in letting Ashlee Picket do things at the house, accompany her to things, and repeat and progress in letting him see feet and other aspects of her body which may be ego dystonic to her.  Practice letting it be strange but true that she commends the attention and interest of a kind and suitable man.  Practice letting it not have to be the same feeling she had with Ashlee Chavez to be legitimate.  Self-affirm that being capable of living by herself is an asset, not cause for doubt, and that strong, adolescent feelings of being in love are both more difficult at her age and suppressed by a range of things, while  her relationship with Ashlee Chavez has all the behavioral  hallmarks of being very strong and good.  The rest is about accommodating a partnership being so used to living alone.  To break down "too good to be true" feelings, road-test conflict -- broach the subject of "dealbreakers" and strong preferences, solicit what Ashlee Chavez would be, and work up assertive requests where needed (e.g., less talking during TV/movie). Work OCD -- Although worries about making mistakes on the job are much lower now, look for opportunities to challenge her worrying mind by coming up with  mistakes that she can either imagine or "fake", and in any case look for a way to laugh about her own mind and intrusive worries.  For any feeling underutilized, recommend asking boss -- at interest -- for additional duties in slow weeks to make job more fulfilling and ego-syntonic. CBT/SFT tactics for worry/anxiety/OCD -- Conceive of worrying voice as "Ashlee Chavez", her inner critic who is less of an enemy than an overworried friend.  Continue to seek serendipitous exposure opportunities to engage or let herself show and notice how it works out. Physiological self-care -- Maintain vitamin supplements, include omega-3 for anti-inflammatory benefits and brain health.  Work with timing on medication adjustments to maximize rest at night and alertness by day.  Lip tremor and psychomotor retardation, along with reported fatigue, could signal nutritional deficiencies, e.g., B1, B6, B12, D, E, or a medication-related issue -- work with psychiatry as needed. Longterm bereavement -- seek to notice and accept all grief feelings when they come, no judgment Other recommendations/advice -- As may be noted above.  Continue to utilize previously learned skills ad lib. Medication compliance -- Maintain medication as prescribed and work faithfully with relevant prescriber(s) if any changes are desired or seem indicated. Crisis service -- Aware of call list and work-in appts.  Call the clinic on-call service, 988/hotline, 911, or present to [facility] or ER if any life-threatening psychiatric crisis. Followup -- Return in about 6 weeks (around 06/11/2023) for time at discretion.  Next scheduled visit with me Visit date not found.  Next scheduled in this office 10/08/2023.  Robley Fries, PhD [name], PhD LP Clinical Psychologist, [facility] [address] [place], [place] [address] [phone]

## 2023-05-16 ENCOUNTER — Other Ambulatory Visit (INDEPENDENT_AMBULATORY_CARE_PROVIDER_SITE_OTHER): Payer: BC Managed Care – PPO

## 2023-05-16 DIAGNOSIS — E063 Autoimmune thyroiditis: Secondary | ICD-10-CM | POA: Diagnosis not present

## 2023-05-16 DIAGNOSIS — R7301 Impaired fasting glucose: Secondary | ICD-10-CM

## 2023-05-16 DIAGNOSIS — E7800 Pure hypercholesterolemia, unspecified: Secondary | ICD-10-CM

## 2023-05-16 DIAGNOSIS — E559 Vitamin D deficiency, unspecified: Secondary | ICD-10-CM | POA: Diagnosis not present

## 2023-05-16 LAB — COMPREHENSIVE METABOLIC PANEL
ALT: 19 U/L (ref 0–35)
AST: 18 U/L (ref 0–37)
Albumin: 4.4 g/dL (ref 3.5–5.2)
Alkaline Phosphatase: 51 U/L (ref 39–117)
BUN: 22 mg/dL (ref 6–23)
CO2: 27 mEq/L (ref 19–32)
Calcium: 9.5 mg/dL (ref 8.4–10.5)
Chloride: 107 mEq/L (ref 96–112)
Creatinine, Ser: 0.93 mg/dL (ref 0.40–1.20)
GFR: 65.38 mL/min (ref >60.00)
Glucose, Bld: 73 mg/dL (ref 70–99)
Potassium: 4.1 mEq/L (ref 3.5–5.1)
Sodium: 141 mEq/L (ref 135–145)
Total Bilirubin: 0.3 mg/dL (ref 0.2–1.2)
Total Protein: 7 g/dL (ref 6.0–8.3)

## 2023-05-16 LAB — HEMOGLOBIN A1C: Hgb A1c MFr Bld: 5.7 % (ref 4.6–6.5)

## 2023-05-16 LAB — VITAMIN D 25 HYDROXY (VIT D DEFICIENCY, FRACTURES): VITD: 45.06 ng/mL (ref 30.00–100.00)

## 2023-05-16 LAB — LIPID PANEL
Cholesterol: 176 mg/dL (ref 0–200)
HDL: 73 mg/dL (ref >39.00)
LDL Cholesterol: 86 mg/dL (ref 0–99)
NonHDL: 102.85
Total CHOL/HDL Ratio: 2
Triglycerides: 82 mg/dL (ref 0.0–149.0)
VLDL: 16.4 mg/dL (ref 0.0–40.0)

## 2023-05-16 LAB — TSH: TSH: 0.06 u[IU]/mL — ABNORMAL LOW (ref 0.35–5.50)

## 2023-05-16 LAB — T4, FREE: Free T4: 0.91 ng/dL (ref 0.60–1.60)

## 2023-05-20 ENCOUNTER — Ambulatory Visit: Payer: BC Managed Care – PPO | Admitting: Endocrinology

## 2023-05-20 ENCOUNTER — Encounter: Payer: Self-pay | Admitting: Endocrinology

## 2023-05-20 VITALS — BP 90/60 | HR 71 | Ht 64.0 in | Wt 124.0 lb

## 2023-05-20 DIAGNOSIS — E7800 Pure hypercholesterolemia, unspecified: Secondary | ICD-10-CM | POA: Diagnosis not present

## 2023-05-20 DIAGNOSIS — E063 Autoimmune thyroiditis: Secondary | ICD-10-CM

## 2023-05-20 DIAGNOSIS — M810 Age-related osteoporosis without current pathological fracture: Secondary | ICD-10-CM

## 2023-05-20 MED ORDER — LEVOTHYROXINE SODIUM 88 MCG PO TABS: 88.0000 ug | ORAL_TABLET | Freq: Every day | ORAL | 3 refills | Status: DC

## 2023-05-20 NOTE — Progress Notes (Signed)
Patient ID: Ashlee Chavez, female   DOB: 1961/04/09, 62 y.o.   MRN: [id number]           Chief complaint: Endocrinology follow-up  History of Present Illness:    OSTEOPOROSIS:   She had a bone density done in 2017 and this showed the following T-scores: Femoral neck: -2.7, previously -1.8 Spine: -1.9  By comparison she had a decline of 8-10 % in her bone density from 2 years before and 15% result from baseline She initially had a half inch height loss from baseline  She could not afford Evista and did not want to consider Reclast previously because of the possible high one time cost  In 05/2016 she was started on treatment with risedronate 150 mg for her declining bone density  She finished her 5-year regimen and stopped it in 8/22  She has been started on Reclast after her T-score at the femoral neck was -2.6 She had her first infusion on 07/10/22 She had no side effects with this  She is taking calcium supplements, regular on Os-Cal.  Still taking 5000 units of vitamin D with consistently normal vitamin D levels  BONE density: Previous study done by gynecologist in April 2021 shows T-score of the femoral neck to be -2.4 compared to -2.7 In 03/2022 with T score at the femoral neck is -2.6  LABS:  Lab Results  Component Value Date   VD25OH 45.06 05/16/2023   VD25OH 40.63 07/02/2022    HYPOTHYROIDISM:  She had a goiter diagnosed in 1996. She had a positive peroxidase antibody and has been on thyroxine since 1996  Her TSH was high in 2006 and she had symptoms of fatigue, cold intolerance and some hair loss  Since then has been on 75 mcg of Synthroid with occasional adjustments in the dose  Since 01/2014 she had been taking the same dose of levothyroxine, 75 g daily  Her TSH was higher than usual in 11/16 and she was having complaints about depression, insomnia, fatigue and more difficulties with her anxiety  Has had chronic cold sensitivity   Her dose was increased  in 7/17 and in 1/19 it was further increased to to 137 mcg because of a high TSH She was not having any unusual fatigue but her TSH was gradually increasing She was initially having some palpitations and shakiness but no symptoms on the visit when she was on 125 mcg  Recent history:  From 8/23 she was on a dose of 112 mcg levothyroxine However she says she could not get the refill processed in November and went back to taking 100 mcg from her previous prescription However with this her TSH was normal in 11/2022  No complaints of unusual fatigue, no anxiety, shakiness or palpitations Eating better and weight is slightly higher  She does have a history of a small goiter previously  He has been consistent with taking the levothyroxine in the morning before breakfast.  She will take her multivitamin and calcium separately  TSH is now lower than expected at 0.06 with minimal increase in the T4 compared to before  Previous TSH 1.2 Wt Readings from Last 3 Encounters:  05/20/23 124 lb (56.2 kg)  02/24/23 120 lb 9.6 oz (54.7 kg)  11/19/22 123 lb 6.4 oz (56 kg)    Lab Results  Component Value Date   TSH 0.06 (L) 05/16/2023   TSH 1.23 11/13/2022   TSH 5.92 (H) 07/02/2022   FREET4 0.91 05/16/2023   FREET4 0.84 07/02/2022  FREET4 3.25 (H) 01/01/2022    OTHER active problems discussed and review of systems   Past Medical History:  Diagnosis Date   Gum lesion    Biopsy done-benign   Hypercholesteremia    Hypothyroid    LGSIL (low grade squamous intraepithelial dysplasia) 2010   Migraines    OCD (obsessive compulsive disorder)    Osteoporosis 01/2016   T score -2.7    Past Surgical History:  Procedure Laterality Date   basal cell excised N/A    BREAST BIOPSY Right    Benign   BREAST LUMPECTOMY  2001   right-benign   CATARACT EXTRACTION  2005   Gum Biopsy     Benign-2019   IR ANGIO EXTERNAL CAROTID SEL EXT CAROTID UNI R MOD SED  11/20/2021   IR ANGIO INTRA EXTRACRAN SEL  INTERNAL CAROTID BILAT MOD SED  11/20/2021   IR ANGIO VERTEBRAL SEL VERTEBRAL BILAT MOD SED  11/20/2021  `  Family History  Problem Relation Age of Onset   Hypertension Mother    Heart disease Mother    Hypertension Father    Breast cancer Paternal Aunt        Age 64   Diabetes Neg Hx     Social History:  reports that she has never smoked. She has never used smokeless tobacco. She reports current alcohol use. She reports that she does not use drugs.  Allergies:  Allergies  Allergen Reactions   Ampicillin Rash    Allergies as of 05/20/2023       Reactions   Ampicillin Rash        Medication List        Accurate as of May 20, 2023  8:21 AM. If you have any questions, ask your nurse or doctor.          ALPRAZolam 0.25 MG tablet Commonly known as: XANAX Take 1 tablet (0.25 mg total) by mouth daily as needed for anxiety.   atorvastatin 20 MG tablet Commonly known as: LIPITOR TAKE 1 TABLET BY MOUTH EVERY DAY   clomiPRAMINE 75 MG capsule Commonly known as: ANAFRANIL Take 1 capsule (75 mg total) by mouth at bedtime.   fluvoxaMINE 100 MG tablet Commonly known as: LUVOX Take 0.5 tablets (50 mg total) by mouth 2 (two) times daily.   levothyroxine 100 MCG tablet Commonly known as: SYNTHROID Take 1 tablet (100 mcg total) by mouth daily.   methylphenidate 36 MG CR tablet Commonly known as: CONCERTA Take 2 tablets (72 mg total) by mouth daily.   methylphenidate 36 MG CR tablet Commonly known as: CONCERTA Take 2 tablets (72 mg total) by mouth daily.   metoCLOPramide 10 MG tablet Commonly known as: REGLAN SMARTSIG:1 Tablet(s) By Mouth 1-2 Times Daily   OLANZapine 10 MG tablet Commonly known as: ZYPREXA Take 1 tablet (10 mg total) by mouth at bedtime.   OSCAL 500/200 D-3 PO Take by mouth.   propranolol 20 MG tablet Commonly known as: INDERAL TAKE 1-2 TABLETS BY MOUTH TWICE A DAY AS NEEDED FOR TREMORS   SUMAtriptan 100 MG tablet Commonly known as:  IMITREX Take 100 mg by mouth daily as needed for migraine.   topiramate 100 MG tablet Commonly known as: TOPAMAX Take 1 tablet (100 mg total) by mouth 2 (two) times daily.   VITAMIN D PO Take by mouth.         Review of Systems   HYPERCHOLESTEROLEMIA: She has been on Lipitor 20 mg for several years  LDL usually at target,  now being followed by PCP also She has no other risk factors besides hypercholesterolemia Follows low-fat diet  Lab Results  Component Value Date   CHOL 176 05/16/2023   HDL 73.00 05/16/2023   LDLCALC 86 05/16/2023   TRIG 82.0 05/16/2023   CHOLHDL 2 05/16/2023    GLUCOSE: Her fasting glucose has occasionally been above 100 previously but not in the diabetic range Last glucose 73  A1c is variable, now down to 5.7 compared to 6.3  No family history of diabetes  Lab Results  Component Value Date   HGBA1C 5.7 05/16/2023   HGBA1C 6.3 07/02/2022   HGBA1C 6.0 01/01/2022   Lab Results  Component Value Date   LDLCALC 86 05/16/2023   CREATININE 0.93 05/16/2023    BP Readings from Last 3 Encounters:  05/20/23 90/60  02/24/23 (!) 90/56  11/19/22 94/60     PHYSICAL EXAM:  BP 90/60 (BP Location: Left Arm, Patient Position: Sitting)   Pulse 71   Ht 5\' 4"  (1.626 m)   Wt 124 lb (56.2 kg)   LMP 11/19/2010   SpO2 97%   BMI 21.28 kg/m   Thyroid not palpable No tremor Reflexes show normal relaxation  ASSESSMENT:    HYPOTHYROIDISM:   She has longstanding primary hypothyroidism with a small goiter, likely from Hashimoto's thyroiditis  She is having variable TSH levels and more recently appears to be requiring lower doses of supplement With her TSH normal in January now with the same dose her TSH is suppressed but asymptomatic She change the way she takes her levothyroxine  Osteoporosis: Has tolerated Reclast as of 8/23 and is due for another infusion next month   PLAN:  She will continue vitamin D and calcium  Insert of 0.1 mg of  Synthroid she will go down to 88 mcg and needs short-term follow-up to assess her thyroid  Will reschedule Reclast in August at the infusion center  Consider bone density end of the year again  Reather Littler 05/20/2023, 8:22 AM

## 2023-05-21 ENCOUNTER — Telehealth: Payer: Self-pay | Admitting: Pharmacy Technician

## 2023-05-21 NOTE — Telephone Encounter (Signed)
Dr. Lucianne Muss, Lorain Childes note:  Patient will be scheduled as soon as possible.  Auth Submission: NO AUTH NEEDED Site of care: Site of care: [facility] Payer: [employer] Medication & CPT/J Code(s) submitted: Reclast (Zolendronic acid) [id number] Route of submission (phone, fax, portal):  Phone # Fax # Auth type: Buy/Bill Units/visits requested: 1 Reference number:  Approval from: 05/21/23 to 11/11/23

## 2023-06-16 ENCOUNTER — Other Ambulatory Visit: Payer: Self-pay

## 2023-06-16 DIAGNOSIS — F900 Attention-deficit hyperactivity disorder, predominantly inattentive type: Secondary | ICD-10-CM

## 2023-06-16 MED ORDER — METHYLPHENIDATE HCL ER (OSM) 36 MG PO TBCR
72.0000 mg | EXTENDED_RELEASE_TABLET | Freq: Every day | ORAL | 0 refills | Status: DC
Start: 2023-07-14 — End: 2023-09-07

## 2023-06-16 MED ORDER — METHYLPHENIDATE HCL ER 36 MG PO TB24
72.0000 mg | ORAL_TABLET | Freq: Every day | ORAL | 0 refills | Status: AC
Start: 2023-06-16 — End: ?

## 2023-06-27 ENCOUNTER — Ambulatory Visit: Payer: BC Managed Care – PPO | Admitting: Psychiatry

## 2023-06-30 DIAGNOSIS — L298 Other pruritus: Secondary | ICD-10-CM | POA: Diagnosis not present

## 2023-06-30 DIAGNOSIS — Z789 Other specified health status: Secondary | ICD-10-CM | POA: Diagnosis not present

## 2023-06-30 DIAGNOSIS — L821 Other seborrheic keratosis: Secondary | ICD-10-CM | POA: Diagnosis not present

## 2023-06-30 DIAGNOSIS — L820 Inflamed seborrheic keratosis: Secondary | ICD-10-CM | POA: Diagnosis not present

## 2023-06-30 DIAGNOSIS — R208 Other disturbances of skin sensation: Secondary | ICD-10-CM | POA: Diagnosis not present

## 2023-06-30 DIAGNOSIS — D225 Melanocytic nevi of trunk: Secondary | ICD-10-CM | POA: Diagnosis not present

## 2023-06-30 DIAGNOSIS — Z08 Encounter for follow-up examination after completed treatment for malignant neoplasm: Secondary | ICD-10-CM | POA: Diagnosis not present

## 2023-06-30 DIAGNOSIS — L814 Other melanin hyperpigmentation: Secondary | ICD-10-CM | POA: Diagnosis not present

## 2023-07-11 ENCOUNTER — Ambulatory Visit (INDEPENDENT_AMBULATORY_CARE_PROVIDER_SITE_OTHER): Payer: BC Managed Care – PPO

## 2023-07-11 VITALS — BP 93/61 | HR 61 | Temp 98.1°F | Resp 16 | Ht 63.0 in | Wt 118.8 lb

## 2023-07-11 DIAGNOSIS — M810 Age-related osteoporosis without current pathological fracture: Secondary | ICD-10-CM | POA: Diagnosis not present

## 2023-07-11 MED ORDER — ZOLEDRONIC ACID 5 MG/100ML IV SOLN
5.0000 mg | Freq: Once | INTRAVENOUS | Status: AC
  Administered 2023-07-11: 5 mg via INTRAVENOUS
  Filled 2023-07-11: qty 100

## 2023-07-11 MED ORDER — ACETAMINOPHEN 325 MG PO TABS: 650.0000 mg | ORAL_TABLET | Freq: Once | ORAL | Status: DC

## 2023-07-11 MED ORDER — SODIUM CHLORIDE 0.9 % IV SOLN: INTRAVENOUS | Status: DC

## 2023-07-11 NOTE — Progress Notes (Signed)
Diagnosis: Osteoporosis  Provider:  Chilton Greathouse MD  Procedure: IV Infusion  IV Type: Peripheral, IV Location: L Antecubital  Reclast (Zolendronic Acid), Dose: 5 mg  Infusion Start Time: 0930  Infusion Stop Time: 0958  Post Infusion IV Care: Peripheral IV Discontinued  Discharge: Condition: Good, Destination: Home . AVS Declined  Performed by:  Nat Math, RN

## 2023-07-14 ENCOUNTER — Other Ambulatory Visit: Payer: Self-pay | Admitting: Psychiatry

## 2023-07-14 DIAGNOSIS — R251 Tremor, unspecified: Secondary | ICD-10-CM

## 2023-07-29 ENCOUNTER — Other Ambulatory Visit: Payer: Self-pay | Admitting: Psychiatry

## 2023-07-29 ENCOUNTER — Ambulatory Visit: Payer: BC Managed Care – PPO | Admitting: Psychiatry

## 2023-07-29 DIAGNOSIS — F4010 Social phobia, unspecified: Secondary | ICD-10-CM | POA: Diagnosis not present

## 2023-07-29 DIAGNOSIS — R69 Illness, unspecified: Secondary | ICD-10-CM | POA: Diagnosis not present

## 2023-07-29 DIAGNOSIS — F330 Major depressive disorder, recurrent, mild: Secondary | ICD-10-CM

## 2023-07-29 DIAGNOSIS — F422 Mixed obsessional thoughts and acts: Secondary | ICD-10-CM | POA: Diagnosis not present

## 2023-07-29 DIAGNOSIS — G43009 Migraine without aura, not intractable, without status migrainosus: Secondary | ICD-10-CM

## 2023-07-29 NOTE — Progress Notes (Signed)
Psychotherapy Progress Note [facility] [name], PhD LP  Patient ID: Ashlee Chavez)    MRN: [id number] Therapy format: Individual psychotherapy Date: 07/29/2023      Start: 8:14a     Stop: 8:54a     Time Spent: 40 min Location: In-person   Session narrative (presenting needs, interim history, self-report of stressors and symptoms, applications of prior therapy, status changes, and interventions made in session) C/o fatigue, takes caffeine pills, never after noon, and no c/o withdrawal or HA.  B complex in the morning, and lots of light and good water.  Honestly tiring of work, and if [name] had his way, she'dd retire December, her way, age 62, possible compromise, then hold off collecting Social Security until 13 (January 2031), relying on current incomes.  Work itself is status quo, not all that much to make her sweat, but did just receive a new responsibility that has her anxious.  Affirmed it's "[name]" worrying, [name] maintains clear confidence in her and is willing to work through any errors and learning curve.  Admittedly frightened by the prospect of cohabitating.  Framed challenges to (a) let herself dress down some while he's here (e.g., pajamas -- 8/10 SUDS), and (b) carve time to do something alone (e.g., an hour of reading, also 8/10 SUDS).  On a subtler level, just using the words "life together" is also intimidating, so challenge (c) to think, speak, hear, share those words.  Reviewing opportunities so far, [name] typically stays the weekend 2 nights, 1 4-nighter per month, but the one time she tried to read, he asked doesn't she want to spend time with him, unknowingly reinforcing the compulsion to be "on".  Honestly enjoys his company and misses it when gone, and acknowledged [name] is the genuine article, and he's passed character tests learned with the three boyfriends she's known during our time working together (not to mention the fiance she lost  to a mysterious breakup younger).  And clear [name] would be open to knowing about her struggling with the compulsion to be "on" for him and any project to warm up to married life.  Asked about opportunities so far to work out their steps domestically, she has been able to let him pay for some things, make food, do dishes (typically drying them with him), and let him clean (no struggle about doing it correctly, just about conceding the duty).  Probed for other sources of premarital anxiety, but it pretty much comes down to resistance changing decades-old habits living single with OCD and learning to hand off.  Generally encouraged in having that conversation with him to frame the practice opportunity, look for growth in trust (hers that she is not derelicting duty and is allowed to relax in plain sight, his that she is not learning to un-love him if she takes some alone time), and otherwise work up available experience sharing space, time, and responsibilities.  After session review of HR notes very low TSH reading in July.  Appt upcoming 9/26 with endocrinology.  Therapeutic modalities: Cognitive Behavioral Therapy, Solution-Oriented/Positive Psychology, and Ego-Supportive  Mental Status/Observations:  Appearance:   Casual and Neat     Behavior:  Appropriate  Motor:  Normal  Speech/Language:   Clear and Coherent  Affect:  Appropriate  Mood:  anxious  Thought process:  normal  Thought content:    WNL and Obsessions  Sensory/Perceptual disturbances:    WNL  Orientation:  Fully oriented  Attention:  Good  Concentration:  Good  Memory:  WNL  Insight:    Fair  Judgment:   Good  Impulse Control:  Good   Risk Assessment: Danger to Self: No Self-injurious Behavior: No Danger to Others: No Physical Aggression / Violence: No Duty to Warn: No Access to Firearms a concern: No  Assessment of progress:  progressing  Diagnosis:   ICD-10-CM   1. Mild recurrent major depression (HCC)  F33.0      2. Social anxiety disorder  F40.10     3. Mixed obsessional thoughts and acts  F42.2     4. R/O B12/folate deficiencies, protein deficiency, monoamine synthesis problem  R69      Plan:  For social anxiety and OCD attached to relationship -- Experiment further in letting [name] do things at the house, accompany her to things, and repeat and progress in letting him see her feet and other aspects of her body which may be ego dystonic to her.  Practice letting it be strange but true that she commends the attention and interest of a kind and suitable man.  Practice letting it not have to be the same feeling she had with [name] to be legitimate.  Self-affirm that being capable of living by herself is an asset, not cause for doubt, and that strong, adolescent feelings of being in love are both more difficult at her age and suppressed by a range of things, while her relationship with [name] has all the behavioral  hallmarks of being very strong and good.  To break down "too good to be true" feelings, road-test conflict -- broach the subject of "dealbreakers" and strong preferences, solicit what [name] would be, and work up assertive requests where needed (e.g., less talking during TV/movie).  The rest is about accommodating a partnership after being so used to living alone and longsuffering -- challenge to allow herself to dress down, and to take some amount of alone time while together, and to talk about it together, as practice for healthy transition to married life. Work OCD -- Although worries about making mistakes on the job are much lower now, look for opportunities to challenge her worrying mind by coming up with mistakes that she can either imagine or "fake", and in any case look for a way to laugh about her own mind and intrusive worries.  For any feeling underutilized, recommend asking boss -- at interest -- for additional duties in slow weeks to make job more fulfilling and ego-syntonic. CBT/SFT  tactics for worry/anxiety/OCD -- Conceive of worrying voice as "[name]", her inner critic who is less of an enemy than an overworried friend.  Continue to seek serendipitous exposure opportunities to engage or let herself show and notice how it works out. Physiological self-care -- Maintain vitamin supplements, include omega-3 for anti-inflammatory benefits and brain health.  Work with timing on medication adjustments to maximize rest at night and alertness by day.  Lip tremor and psychomotor retardation, along with reported fatigue, could signal nutritional deficiencies, e.g., B1, B6, B12, D, E, or a medication-related issue -- work with psychiatry as needed. Longterm bereavement -- seek to notice and accept all grief feelings when they come, no judgment Other recommendations/advice -- As may be noted above.  Continue to utilize previously learned skills ad lib. Medication compliance -- Maintain medication as prescribed and work faithfully with relevant prescriber(s) if any changes are desired or seem indicated. Crisis service -- Aware of call list and work-in appts.  Call the clinic on-call service, 988/hotline, 911,  or present to [facility] or ER if any life-threatening psychiatric crisis. Followup -- Return in about 6 weeks (around 09/09/2023) for avail earlier @ PT's need.  Next scheduled visit with me Visit date not found.  Next scheduled in this office 10/08/2023.  Robley Fries, PhD [name], PhD LP Clinical Psychologist, [facility] [address] [place], [place] [address] [phone]

## 2023-07-30 ENCOUNTER — Other Ambulatory Visit: Payer: BC Managed Care – PPO

## 2023-08-06 ENCOUNTER — Ambulatory Visit: Payer: BC Managed Care – PPO | Admitting: Endocrinology

## 2023-08-07 ENCOUNTER — Other Ambulatory Visit (INDEPENDENT_AMBULATORY_CARE_PROVIDER_SITE_OTHER): Payer: BC Managed Care – PPO

## 2023-08-07 DIAGNOSIS — M810 Age-related osteoporosis without current pathological fracture: Secondary | ICD-10-CM

## 2023-08-07 DIAGNOSIS — E063 Autoimmune thyroiditis: Secondary | ICD-10-CM

## 2023-08-07 LAB — T4, FREE: Free T4: 1.04 ng/dL (ref 0.60–1.60)

## 2023-08-07 LAB — CREATININE, SERUM: Creatinine, Ser: 0.97 mg/dL (ref 0.40–1.20)

## 2023-08-07 LAB — TSH: TSH: 0.38 u[IU]/mL (ref 0.35–5.50)

## 2023-08-14 ENCOUNTER — Ambulatory Visit: Payer: BC Managed Care – PPO | Admitting: Endocrinology

## 2023-08-14 ENCOUNTER — Encounter: Payer: Self-pay | Admitting: Endocrinology

## 2023-08-14 VITALS — BP 90/70 | HR 67 | Ht 63.0 in | Wt 119.4 lb

## 2023-08-14 DIAGNOSIS — M810 Age-related osteoporosis without current pathological fracture: Secondary | ICD-10-CM | POA: Diagnosis not present

## 2023-08-14 DIAGNOSIS — E063 Autoimmune thyroiditis: Secondary | ICD-10-CM | POA: Diagnosis not present

## 2023-08-14 MED ORDER — LEVOTHYROXINE SODIUM 88 MCG PO TABS: 88.0000 ug | ORAL_TABLET | Freq: Every day | ORAL | 3 refills | Status: DC

## 2023-08-14 NOTE — Progress Notes (Signed)
Outpatient Endocrinology Note Ashlee Kyerra Vargo, MD  08/14/23  Patient's Name: Ashlee Chavez    DOB: [birth year]    MRN: [id number]  REASON OF VISIT: Follow-up for hypothyroidism /osteoporosis  PCP: Lorenda Ishihara, MD  HISTORY OF PRESENT ILLNESS:   Ashlee Chavez is a 62 y.o. old female with past medical history as listed below is presented for a follow up of hypothyroidism /osteoporosis.   Pertinent Thyroid History: - Patient was diagnosed with hypothyroidism  in 1996.  She has positive thyroid peroxidase antibody consistent with Hashimoto's thyroiditis.  She has been on thyroxine since 1996.  Levothyroxine dose has been adjusted multiple times over time.  She does have a history of a small goiter previously.   -In July 2024 levothyroxine dose was decreased from 100 mcg to 88 mcg daily when TSH was low 0.06.  # Osteoporosis: -She had bone mineral density in 2017 showing T-score at femoral neck -2.7, previously -1.8 and a spine -1.9, by complications she had a decline of 8 to 10% in her bone density from 2 years before and 15% resolved from baseline.  She could not 8 4 every*and did not want to consider Reclast previously because of cost.  In July 2017 she was started on treatment with risedronate 150 mg for her declining bone density.  She completed 5 years regimen and is stopped in August 2022.  She was started on Reclast after her T-score at the femoral neck was -2.6 on DEXA scan in May 2023.   BONE density: Previous study done by gynecologist in April 2021 shows T-score of the femoral neck to be -2.4 compared to -2.7 In 03/2022 with T score at the femoral neck is -2.6  She has been on calcium supplement regular on Os-Cal.  Is been taking vitamin D3 5000 international unit daily with normal vitamin D level consistently.  First dose of Reclast infusion was in August 2023, second dose of Reclast in July 11, 2023.  Interval history 08/14/23 Patient had received Reclast on  July 11, 2023 tolerated well.  She has been taking calcium and vitamin D supplement.  No fall and fracture. She has been taking levothyroxine 88 mcg daily and taking in the morning before breakfast and reports compliance.  Dose was decreased in July.  Recent thyroid function test normal.  No hypo and hyperthyroid symptoms.  No other complaints today.   Latest Reference Range & Units 08/07/23 08:15  TSH 0.35 - 5.50 uIU/mL 0.38  T4,Free(Direct) 0.60 - 1.60 ng/dL 8.41    REVIEW OF SYSTEMS:  As per history of present illness.   PAST MEDICAL HISTORY: Past Medical History:  Diagnosis Date   Gum lesion    Biopsy done-benign   Hypercholesteremia    Hypothyroid    LGSIL (low grade squamous intraepithelial dysplasia) 2010   Migraines    OCD (obsessive compulsive disorder)    Osteoporosis 01/2016   T score -2.7    PAST SURGICAL HISTORY: Past Surgical History:  Procedure Laterality Date   basal cell excised N/A    BREAST BIOPSY Right    Benign   BREAST LUMPECTOMY  2001   right-benign   CATARACT EXTRACTION  2005   Gum Biopsy     Benign-2019   IR ANGIO EXTERNAL CAROTID SEL EXT CAROTID UNI R MOD SED  11/20/2021   IR ANGIO INTRA EXTRACRAN SEL INTERNAL CAROTID BILAT MOD SED  11/20/2021   IR ANGIO VERTEBRAL SEL VERTEBRAL BILAT MOD SED  11/20/2021    ALLERGIES: Allergies  Allergen Reactions   Ampicillin Rash    FAMILY HISTORY:  Family History  Problem Relation Age of Onset   Hypertension Mother    Heart disease Mother    Hypertension Father    Breast cancer Paternal Aunt        Age 56   Diabetes Neg Hx     SOCIAL HISTORY: Social History   Socioeconomic History   Marital status: Single    Spouse name: Not on file   Number of children: Not on file   Years of education: Not on file   Highest education level: Not on file  Occupational History   Not on file  Tobacco Use   Smoking status: Never   Smokeless tobacco: Never  Vaping Use   Vaping status: Never Used   Substance and Sexual Activity   Alcohol use: Yes    Alcohol/week: 0.0 standard drinks of alcohol    Comment: wine occassionally   Drug use: No   Sexual activity: Not Currently    Birth control/protection: Post-menopausal    Comment: 1st intercourse 17 yo-5 partners  Other Topics Concern   Not on file  Social History Narrative   Not on file   Social Determinants of Health   Financial Resource Strain: Not on file  Food Insecurity: Not on file  Transportation Needs: Not on file  Physical Activity: Not on file  Stress: Not on file  Social Connections: Not on file    MEDICATIONS:  Current Outpatient Medications  Medication Sig Dispense Refill   ALPRAZolam (XANAX) 0.25 MG tablet Take 1 tablet (0.25 mg total) by mouth daily as needed for anxiety. 30 tablet 0   atorvastatin (LIPITOR) 20 MG tablet TAKE 1 TABLET BY MOUTH EVERY DAY 90 tablet 1   Calcium Carbonate-Vitamin D (OSCAL 500/200 D-3 PO) Take by mouth.     Cholecalciferol (VITAMIN D PO) Take by mouth.     clomiPRAMINE (ANAFRANIL) 75 MG capsule Take 1 capsule (75 mg total) by mouth at bedtime. 90 capsule 1   fluvoxaMINE (LUVOX) 100 MG tablet Take 0.5 tablets (50 mg total) by mouth 2 (two) times daily. 90 tablet 1   methylphenidate 36 MG PO CR tablet Take 2 tablets (72 mg total) by mouth daily. 60 tablet 0   metoCLOPramide (REGLAN) 10 MG tablet SMARTSIG:1 Tablet(s) By Mouth 1-2 Times Daily     OLANZapine (ZYPREXA) 10 MG tablet Take 1 tablet (10 mg total) by mouth at bedtime. 90 tablet 1   propranolol (INDERAL) 20 MG tablet TAKE 1-2 TABLETS BY MOUTH TWICE A DAY AS NEEDED FOR TREMORS 360 tablet 0   SUMAtriptan (IMITREX) 100 MG tablet Take 100 mg by mouth daily as needed for migraine.     topiramate (TOPAMAX) 100 MG tablet TAKE 1 TABLET BY MOUTH TWICE A DAY 180 tablet 0   levothyroxine (SYNTHROID) 88 MCG tablet Take 1 tablet (88 mcg total) by mouth daily. 90 tablet 3   No current facility-administered medications for this visit.     PHYSICAL EXAM: Vitals:   08/14/23 0816  BP: 90/70  Pulse: 67  SpO2: 97%  Weight: 119 lb 6.4 oz (54.2 kg)  Height: 5\' 3"  (1.6 m)   Body mass index is 21.15 kg/m.  Wt Readings from Last 3 Encounters:  08/14/23 119 lb 6.4 oz (54.2 kg)  07/11/23 118 lb 12.8 oz (53.9 kg)  05/20/23 124 lb (56.2 kg)     General: Well developed, well nourished female in no apparent distress.  HEENT: AT/[place], no  external lesions. Hearing intact to the spoken word Eyes:  Conjunctiva clear and no icterus. Neck: Trachea midline, neck supple without appreciable thyromegaly or lymphadenopathy and no palpable thyroid nodules Lungs: Clear to auscultation, no wheeze. Respirations not labored Heart: S1S2, Regular in rate and rhythm. No loud murmurs Abdomen: Soft, non tender Neurologic: Alert, oriented, normal speech, deep tendon biceps reflexes normal Extremities: No pedal pitting edema, no tremors of outstretched hands Skin: Warm, color good.  Psychiatric: Does not appear depressed or anxious  PERTINENT HISTORIC LABORATORY AND IMAGING STUDIES:  All pertinent laboratory results were reviewed. Please see HPI also for further details.   TSH  Date Value Ref Range Status  08/07/2023 0.38 0.35 - 5.50 uIU/mL Final  05/16/2023 0.06 (L) 0.35 - 5.50 uIU/mL Final  11/13/2022 1.23 0.35 - 5.50 uIU/mL Final     ASSESSMENT / PLAN  1. Acquired autoimmune hypothyroidism   2. Age-related osteoporosis without current pathological fracture    -Hypothyroidism: She has longstanding history of hypothyroidism on thyroid hormone replacement.  Hypothyroidism due to Hashimoto's thyroiditis. -She is currently taking levothyroxine 88 mcg daily.  Recent thyroid lab normal.  Will continue the same. -Thyroid function test in 6 months prior to follow-up visit.  # Osteoporosis -She was treated with risedronate for 5 years from 2017-2022.  Reclast was started , first dose was in August 2023 and second dose was in August  2024. -Last DEXA scan was in May 2023, T-score of -2.6 at femoral neck.  Plan: -Continue Reclast 5 mg IV annually. -Patient will talk with her gynecology for DEXA scan in May 2025.  She has follow-up with gynecology in February. -Provided bone mineral density is stable, will consider drug holiday after Reclast infusion next year. -Continue current dose of vitamin D and calcium supplement. -Discussed about fall precaution and weightbearing exercise as tolerated.   Diagnoses and all orders for this visit:  Acquired autoimmune hypothyroidism -     T4, free; Future -     TSH; Future  Age-related osteoporosis without current pathological fracture -     Renal function panel; Future  Other orders -     levothyroxine (SYNTHROID) 88 MCG tablet; Take 1 tablet (88 mcg total) by mouth daily.    DISPOSITION Follow up in clinic in 6 months suggested.  All questions answered and patient verbalized understanding of the plan.  Ashlee Pierrette Scheu, MD [facility] [address], [address] [place], [place] [address] Phone # [phone]  At least part of this note was generated using voice recognition software. Inadvertent word errors may have occurred, which were not recognized during the proofreading process.

## 2023-08-26 DIAGNOSIS — H35372 Puckering of macula, left eye: Secondary | ICD-10-CM | POA: Diagnosis not present

## 2023-09-07 ENCOUNTER — Other Ambulatory Visit: Payer: Self-pay

## 2023-09-07 DIAGNOSIS — F900 Attention-deficit hyperactivity disorder, predominantly inattentive type: Secondary | ICD-10-CM

## 2023-09-08 MED ORDER — METHYLPHENIDATE HCL ER (OSM) 36 MG PO TBCR: 72.0000 mg | EXTENDED_RELEASE_TABLET | Freq: Every day | ORAL | 0 refills | Status: DC

## 2023-09-27 ENCOUNTER — Other Ambulatory Visit: Payer: Self-pay | Admitting: Psychiatry

## 2023-09-27 DIAGNOSIS — F422 Mixed obsessional thoughts and acts: Secondary | ICD-10-CM

## 2023-09-27 DIAGNOSIS — E638 Other specified nutritional deficiencies: Secondary | ICD-10-CM

## 2023-09-27 DIAGNOSIS — F332 Major depressive disorder, recurrent severe without psychotic features: Secondary | ICD-10-CM

## 2023-09-27 DIAGNOSIS — F4010 Social phobia, unspecified: Secondary | ICD-10-CM

## 2023-10-08 ENCOUNTER — Ambulatory Visit: Payer: BC Managed Care – PPO | Admitting: Psychiatry

## 2023-10-08 DIAGNOSIS — F422 Mixed obsessional thoughts and acts: Secondary | ICD-10-CM | POA: Diagnosis not present

## 2023-10-08 DIAGNOSIS — F4010 Social phobia, unspecified: Secondary | ICD-10-CM | POA: Diagnosis not present

## 2023-10-08 DIAGNOSIS — F330 Major depressive disorder, recurrent, mild: Secondary | ICD-10-CM | POA: Diagnosis not present

## 2023-10-08 DIAGNOSIS — R69 Illness, unspecified: Secondary | ICD-10-CM | POA: Diagnosis not present

## 2023-10-08 NOTE — Progress Notes (Signed)
Psychotherapy Progress Note [facility] [name], PhD LP  Patient ID: Ashlee Chavez)    MRN: [id number] Therapy format: Individual psychotherapy Date: 10/08/2023      Start: 8:20a     Stop 9:00a    Time Spent: 40 min Location: In-person   Session narrative (presenting needs, interim history, self-report of stressors and symptoms, applications of prior therapy, status changes, and interventions made in session) Has realized durable gains in comfort and confidence -- or at least "it doesn't give me high anxiety like it did" -- to do billing and payroll.  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  Acclimated to other things that are new and challenging in her work, which are certainly fewer and further between.  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  Affirmed and encouraged in continuing to embrace challenges and trust her abilities to learn, do, and assert if needed for room to master them.  Personally, still apprehensive about the time to come when she and [name] live together on an enduring basis and he gets to see her much more as she naturally is, all the time.  Discussed self-consciousness, and the value of moments they already have to notice herself accepted just as she is.  Agrees they have worked out communication pretty fairly, more on her to let down sometimes stringent efforts to put herself together when she sees him, in including sleeping over.  Both for work life doubts and and social life doubts, introduced the idea that she can subject her intrusive thoughts to an interesting test by asking herself  how much money she'd bet that they are true.  Seemed to be enlightening, offering a new avenue to put up with intrusive feelings and trust herself past them.  Therapeutic modalities: Cognitive Behavioral Therapy, Solution-Oriented/Positive Psychology, and Ego-Supportive  Mental Status/Observations:  Appearance:   Casual and Neat     Behavior:  Appropriate  Motor:  Normal  Speech/Language:   Clear and Coherent  Affect:  Appropriate  Mood:  anxious and less  Thought process:  normal  Thought content:    WNL and Obsessions  Sensory/Perceptual disturbances:    WNL  Orientation:  Fully oriented  Attention:  Good    Concentration:  Good  Memory:  WNL  Insight:    Good  Judgment:   Good  Impulse Control:  Good   Risk Assessment: Danger to Self: No Self-injurious Behavior: No Danger to Others: No Physical Aggression / Violence: No Duty to Warn: No Access to Firearms a concern: No  Assessment of progress:  progressing  Diagnosis:   ICD-10-CM   1. 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 Social anxiety disorder  F40.10     3. Mixed obsessional thoughts and acts  F42.2     4. R/O B12/folate deficiencies, protein deficiency, monoamine synthesis problem  R69      Plan:  For social anxiety and OCD attached to relationship -- Experiment further in letting [name] do things at the house, accompany her to things, and repeat and progress in letting him see her feet and other aspects of her body which may be ego  dystonic to her.  Practice letting it be strange but true that she commends the attention and interest of a kind and suitable man.  Practice letting it not have to be the same feeling she had with [name] to be legitimate.  Self-affirm that being capable of living by herself is an asset, not cause for doubt, and that strong, adolescent feelings of being in love are both more difficult at her age and suppressed by a range of things, while her relationship with [name] has all  the behavioral  hallmarks of being very strong and good.  To break down "too good to be true" feelings, road-test conflict -- broach the subject of "dealbreakers" and strong preferences, solicit what [name] would be, and work up assertive requests where needed (e.g., less talking during TV/movie).  The rest is about accommodating a partnership after being so used to living alone and longsuffering -- challenge to allow herself to dress down, and to take some amount of alone time while together, and to talk about it together, as practice for healthy transition to married life.  Try noticing intrusive thoughts and asking herself how much she would actually bet they are true. Work OCD -- Although worries about making mistakes on the job are much lower now, look for opportunities to challenge her worrying mind by coming up with mistakes that she can either imagine or "fake", and in any case look for a way to laugh about her own mind and intrusive worries.  For any feeling underutilized, recommend asking boss -- at interest -- for additional duties in slow weeks to make job more fulfilling and ego-syntonic.   CBT/SFT tactics for worry/anxiety/OCD -- Conceive of worrying voice as "[name]", her inner critic who is less of an enemy than an overworried friend.  Continue to seek serendipitous exposure opportunities to engage or let herself show and notice how it works out. Physiological self-care -- Maintain vitamin supplements, include omega-3 for anti-inflammatory benefits and brain health.  Work with timing on medication adjustments to maximize rest at night and alertness by day.  Lip tremor and psychomotor retardation, along with reported fatigue, could signal nutritional deficiencies, e.g., B1, B6, B12, D, E, or a medication-related issue -- work with psychiatry as needed. Longterm bereavement -- seek to notice and accept all grief feelings when they come, no judgment Other recommendations/advice -- As may be noted  above.  Continue to utilize previously learned skills ad lib. Medication compliance -- Maintain medication as prescribed and work faithfully with relevant prescriber(s) if any changes are desired or seem indicated. Crisis service -- Aware of call list and work-in appts.  Call the clinic on-call service, 988/hotline, 911, or present to [facility] or ER if any life-threatening psychiatric crisis. Followup -- Return in about 6 weeks (around 11/19/2023) for time as available.  Next scheduled visit with me Visit date not found.  Next scheduled in this office 12/15/2023.  Robley Fries, PhD [name], PhD LP Clinical Psychologist, [facility] [address], [address] [place], [place] [address] [phone]

## 2023-10-10 ENCOUNTER — Other Ambulatory Visit: Payer: Self-pay | Admitting: Psychiatry

## 2023-10-10 DIAGNOSIS — R251 Tremor, unspecified: Secondary | ICD-10-CM

## 2023-10-13 ENCOUNTER — Other Ambulatory Visit: Payer: Self-pay

## 2023-10-13 DIAGNOSIS — F900 Attention-deficit hyperactivity disorder, predominantly inattentive type: Secondary | ICD-10-CM

## 2023-10-13 MED ORDER — METHYLPHENIDATE HCL ER (OSM) 36 MG PO TBCR: 72.0000 mg | EXTENDED_RELEASE_TABLET | Freq: Every day | ORAL | 0 refills | Status: DC

## 2023-10-13 MED ORDER — METHYLPHENIDATE HCL ER (OSM) 36 MG PO TBCR: 36.0000 mg | EXTENDED_RELEASE_TABLET | Freq: Every day | ORAL | 0 refills | Status: DC

## 2023-10-29 ENCOUNTER — Other Ambulatory Visit: Payer: Self-pay | Admitting: Psychiatry

## 2023-10-29 DIAGNOSIS — G43009 Migraine without aura, not intractable, without status migrainosus: Secondary | ICD-10-CM

## 2023-10-31 ENCOUNTER — Other Ambulatory Visit: Payer: Self-pay | Admitting: Psychiatry

## 2023-10-31 DIAGNOSIS — F4010 Social phobia, unspecified: Secondary | ICD-10-CM

## 2023-10-31 DIAGNOSIS — F422 Mixed obsessional thoughts and acts: Secondary | ICD-10-CM

## 2023-11-26 ENCOUNTER — Ambulatory Visit: Payer: BC Managed Care – PPO | Admitting: Psychiatry

## 2023-12-15 ENCOUNTER — Ambulatory Visit: Payer: BC Managed Care – PPO | Admitting: Psychiatry

## 2023-12-15 ENCOUNTER — Encounter: Payer: Self-pay | Admitting: Psychiatry

## 2023-12-15 DIAGNOSIS — R251 Tremor, unspecified: Secondary | ICD-10-CM

## 2023-12-15 DIAGNOSIS — G43009 Migraine without aura, not intractable, without status migrainosus: Secondary | ICD-10-CM

## 2023-12-15 DIAGNOSIS — F4010 Social phobia, unspecified: Secondary | ICD-10-CM | POA: Diagnosis not present

## 2023-12-15 DIAGNOSIS — F5102 Adjustment insomnia: Secondary | ICD-10-CM

## 2023-12-15 DIAGNOSIS — F422 Mixed obsessional thoughts and acts: Secondary | ICD-10-CM

## 2023-12-15 DIAGNOSIS — F900 Attention-deficit hyperactivity disorder, predominantly inattentive type: Secondary | ICD-10-CM

## 2023-12-15 DIAGNOSIS — F331 Major depressive disorder, recurrent, moderate: Secondary | ICD-10-CM

## 2023-12-15 MED ORDER — OLANZAPINE 7.5 MG PO TABS: 7.5000 mg | ORAL_TABLET | Freq: Every day | ORAL | 0 refills | Status: DC

## 2023-12-15 MED ORDER — FLUVOXAMINE MALEATE 50 MG PO TABS: 50.0000 mg | ORAL_TABLET | Freq: Two times a day (BID) | ORAL | 1 refills | Status: DC

## 2023-12-15 MED ORDER — CLOMIPRAMINE HCL 75 MG PO CAPS: 75.0000 mg | ORAL_CAPSULE | Freq: Every day | ORAL | 0 refills | Status: DC

## 2023-12-15 MED ORDER — METHYLPHENIDATE HCL ER (OSM) 36 MG PO TBCR: 72.0000 mg | EXTENDED_RELEASE_TABLET | Freq: Every day | ORAL | 0 refills | Status: DC

## 2023-12-15 MED ORDER — METHYLPHENIDATE HCL ER (OSM) 36 MG PO TBCR: 36.0000 mg | EXTENDED_RELEASE_TABLET | Freq: Every day | ORAL | 0 refills | Status: DC

## 2023-12-15 NOTE — Progress Notes (Signed)
Ashlee Chavez [id number] November 14, 1960 63 y.o.  Subjective:   Patient ID:  Ashlee Chavez is a 63 y.o. (DOB [birth year]) female.  Chief Complaint:  Chief Complaint  Patient presents with   Follow-up   Depression   Anxiety    Depression        Associated symptoms include headaches.  Associated symptoms include no decreased concentration, no appetite change and no suicidal ideas.  Past medical history includes anxiety.   Anxiety Symptoms include nervous/anxious behavior. Patient reports no confusion, decreased concentration, dizziness, palpitations or suicidal ideas.     Ashlee Chavez presents today for follow-up of recently worse depression DT relationship loss.    When seen February 10, 2019.  She had received some benefit from Ritalin 20 mg 3 times daily but was having issues with the crash.  We switched to Concerta 72 mg every morning.  September 14, 2019.  She was having persistent bothersome OCD and was interested in further medicine changes to try to help with these symptoms because she has aggressively pursued therapy and still has significant symptoms.  She has been on multiple SSRIs and so we initiated augmentation with risperidone half milligram daily to increase to 1 mg daily. Sig improvement with risperidone.  SE low appetite with it.  Not hungry for dinner. Sleep so much better also.  Improvement is "marked".  Think better bc less obsessing time.  Not constant now.  Noticed benefit within the first week.  Less obsessing lessened depression.  But residual symptoms.  visit October 14, 2019.  She was encouraged to experiment with the dosing of the risperidone up to 2 mg or 3 mg nightly to see if she could get additional reduction in her chronic anxiety and OCD.   seen December 15, 2019.  Settled at 2 mg risperidone bc increased sleep.  Sleep is so much better.  Harder to get up in the morning.   Found out insurance won't cover Concerta anymore.  Not as good on Ritalin TID.  Not  as even a response.  Terrible insurance. Therefore the only med change with switching to Focalin to see if it would be covered better.  Appt 03/09/20, switch to Concerta 72 mg is better with better mood and motivation vs Focalin.  Focus is also better.  Very poor function with Focalin and better with Concerta.   Better overall with more even response to stimulant and better appetite.  No SE.  Duration all day.  Less desperate hopeless feelings on the stimulant.  Stressful times. With increase risperidone a little better mood bump.  SE a little shakiness noticed in jaw.   Not unusual levels of anxiety.  Lost her best friend 1 year ago and still grieving.  Not much family. Alone.  Not close to B and S who live elsewhere.  Talks to God daily.  Gets to work daily.  Function is good there.  Reads and calls people.  OCD no worse with Covid. Did join a Bible study which was a huge step. Plan start lamotrigine   04/04/20 appt with the following noted: Appt moved up.  She thought she was supposed to stop risperidone when started lamotrigine.  Started having SI off the risperidone but wasn't sure if it was SE.  No risperidone 3 mg for 3 weeks.Anxiety shaking sweating crying all started right away.  Also trouble with sleep and can't eat.  Plan:Restart risperidone 1 mg now and another milligram in 2 to 3 hours if not feeling  sufficient relief to get the full dose of 3 mg today.  Then resume 3 mg risperidone each evening. Restart lamotrigine 25 mg daily for 2 weeks and follow the previous instructions on increasing it.  05/11/2020 appointment the following is noted: Marked improvement back on risperidone 3 mg.  SE shakes jaw.  Doesn't affect typing. Started lamotrigine and up to 100 mg for 2 weeks.  Feels it's helped depression. Better mood and able to laugh.  Able to get pizza for Bible study and not cry. Apptetite is not great.  Working on it. Anxiety is better not gone. Plan: no med changes  07/26/20 appt  with the following noted; Increased lamotrigine gradually to 200 mg daily over the phone 6 weeks ago.  No improvement really for depression. With it. CC depression.   Risperidone still helping anxiety. Plan: Increase lamotrigine to 300 mg daily..  09/14/2020 appointment with following noted: Started taper of lamotrigine DT NR. Started latuda 20 mg daily for 10 days.  Tolerated fine.  No effects so far.. Main problem is still depression.  Anxiety is not too bad.  No energy, motivation, sleeeping a lot.  No sig SI. In past started risperidone for anxiety and depression.  Increase Latuda to 40 mg daily. Disc potential DDI with risperidone reducing effectiveness of  Latuda so reduc risperidone to 1.5 mg HS  10/09/2020 phone call from patient: Patient left a voicemail on Friday while we were out of office for the Holiday that she is not any better. She feels the cutting in the 1/2 of Risperidone has really increased her anxiety. She was instructed to call back if not any better but concerned that taking away the Risperidone will really exacerbate her anxiety.  MD response:  At the last appointment we increased Latuda to 40 mg daily and reduced risperidone to half tablet daily.  The goal was to use Latuda and eliminate risperidone.  However since her anxiety has worsened with the reduction in risperidone, I agree with her and she should take the full dose of risperidone as well as continue the Latuda 40 mg daily.  The Ashlee Chavez is intended to help depression.  11/23/2020 appointment with the following noted: Propranolol only once daily. No better with higher Latuda.  No SE either. No change in appetite which is poor.  Still struggling with depression and OCD is a little worse with changes at work. Off lamotrigine. Plan: 5-HTP supplement 200 mg daily  Increase Latuda to 60 mg  for 3 weeks, If no benefit and no SE increase to 80 mg daily with food.  ADD still better with Concerta 72 mg versus  Focalin. Continue high dosage paroxetine 80 mg daily.  01/16/2021 appointment with following noted: Increase Latuda to 80 mg without benefit or SE.  Anxiety and depression with poor STM.  Food getting caught in her throat.  No change in eating.  No change in weight.   Anxious thoughts mostly about work even when not there.  Anxious about work needed on the house she can't afford.  Enjoys reading but feels guilty that she should be doing something else.  More general anxiety than OCD. 5-HTP supplement 200 mg daily Wean Latuda over a week DT NR Reduce mirtazapine to 30 mg HS  ADD still better with Concerta 72 mg versus Focalin. Start clomipramine 25 mg capsules 1 at night for 1 week, Then increase clomipramine to 2 capsules at night and reduce paroxetine to 1-1/2 tablets or 60 mg daily. Wait 1 week  and then get the blood test   03/15/2021 appt noted: OK not great.  About the same.  Not much change in the last 8 weeks. SE none Lab 02/20/21 clomipramine 50 + Luvox 100 = clomip 195 + norclomip 111= total 306 Losing weight and no appetite and hard to eat. Weight loss to 107#. A lot of anxiety and depression   Sleep less sound with less mirtazapine.  Plan: Due to the severity of symptoms,Add olanzapine 5 mg tablet 1 at night Stop mirtazapine Reduce risperidone to 1/2 tablet for 1 week then stop it.  Call if SI recurs.  03/30/2021 appointment with the following noted: It makes me a little tired but so far anxiety is improved.  No problems off risperidone.  Appetite is not better but did get protein bars and is eating.   Less panic.  But still occurs.  Things just started this week to be a little better.  Not huge but it's a start.  A little more energy. Sleep 10 hours but that's unchanged with olanzapine. First time I've had hope in a long time. Plan: A little better with olanzapine 5 mg HS vs risperidone 3 mg. Increase olanzapine to 10 mg HS  Call in 2 weeks if you are not seeing progressive  improvement  04/12/2021 appointment with the following noted: Better with anxiety.  Sleep better.  Daytime could nap if not busy.  Otherwise OK.  Not much change in appetite.  Up to 110# about 2.5# better and trying to get protein twice daily.   A little change in OCD.   Depression is better some and staying up later and doing more than she was.  Doing some things after dinner which is huge.  Big change with depression.  Not retreating to bed as quickly. Plan: continue meds.  05/18/21 appt noted: Further improvement in anxiety.  I feel better but still not going out but not excessively sleeping.  Acting more normally, eating better.  Eating dinner again.  Sleep reduced to 8 hours. SE dry mouth and a little hungrier. Asks about going up on olanzapine to further reduce anxiety. [name] and describes better from 10/10 to 4/10 for anxiety and depression down to 3/10.Marland Kitchen  Anxiety symptoms include: Excessive Worry, Obsessive Compulsive Symptoms:   Handwashing,,.  Pt reports no sleep issues. Pt reports that appetite is good. Pt reports that energy is good and down slightly. Concentration is no change. Suicidal thoughts:  denied by patient. Plan increase olanzapine to 15 mg HS DT partial benefit  06/22/21 appt noted: Too tired and sleepy with 15 mg olanzapine and reduced the dose to 10 mg daily. No other med changes. Overall about the same.  Don't do anything different.  Happy that it's better than it was stays up to 930 until 6 PM.  Don't go out except Bible study and church once weekly. Goes to grocery early to avoid people. Anxiety lately worse than the depression. Dx pulsatile tinnitus Plan: cut olanzapine back to 10 mg HS Increase clomipramine to 75 mg HS  08/13/2021 appointment with the following noted: Anxiety is "good" after increase clomipramine.  Used to be desperate to get home and not now.  Was thinking of disability and now it's better.  I texted a guy on  a dating site first time in 2 years.  It's a big deal.  Started exercising a little.   SE no worse with increase clomipramine. Depression is better too and  manageable with brief downturns.   Sleep is pretty good. Plan no med changes  10/30/2021 appt noted: Pretty good and a little better than last time.  Still getting out a little with a guy she met on dating site and it's going ok.  Still prefers to be home.  Still get anxious going out. Overall best response so far with any med for anxiety.   Depression is manageable. SE is OK Staying up more appropriately late and sleep is OK. Plan : Much better with clomipramine 75 HS Also continue olanzapine 10 HS & fluvoxamine 50 Benefit Concerta 72 mg a.m. Taking propranolol 20 BID for tremor No med changes  01/29/2022 appointment with the following noted: Pretty good and probably doing better. Handled triggers with less obsessions.  Had to have bx breast today and hasn't been obsessing like she would ususally.  Better than she ever expected. Surprising. Feels the med has hit it's stride at this point. No sig caffeine.  Tremor managed. Depresion managed. Concerta is fine without problems.  Not noticeable when it wears off. Sleep good lately. Yes increase fluvoxamine to 100 mg HS and 50 mg AM Repeat clomipramine level .    02/11/22 lab noted: She is now on fluvoxamine 150 mg daily and clomipramine 75 mg daily.  It is known that fluvoxamine can affect the blood level of clomipramine but that is not a problem in her case.  We increased the fluvoxamine at her last appointment on January 29, 2022 to the current dosage.  Let her know there is room to increase either fluvoxamine to 200 mg daily which can be split between morning and evening if desired or taken all at night if desired.  Or the other alternative if she prefers is to increase clomipramine to 100 mg at night.  Either 1 of those options may help further decrease anxiety and obsessions.  However if  she would prefer to keep the dosage the same that is okay as well. Meredith Staggers, MD, DFAPA  04/30/22 appt noted: Tried increasing clomipramine and then fluvoxamine and it made her too sleepy. Depression managed. Still a little OCD and anxiety but pretty much managed unless triggers. Getting out socially and dating which causes anxiety but doing OK. Tolerating meds ok now. Sleep 9-5.  Likes to nap but not usually. No SI in a long time. Plan: Could not tolerate fluvoxamine 100 or clomipramine 100 DT sleepiness Therefore continue fluvoxamine 50 mg twice daily and clomipramine 75 mg nightly Continue olanzapine 10 mg nightly ADD still better with Concerta 72 mg versus Focalin. 5-HTP supplement 200 mg daily No med changes today . . 10/01/2022 appointment noted: Continues same med.  Insurance won't pay for more than 1 tablet daily fluvoxamine. SE some tiredness but sleeps well. Manageable depression .  Anxiety better with meds but not gone.   Concerta still helping. Plan: Therefore continue fluvoxamine 50 mg twice daily and clomipramine 75 mg nightly Continue olanzapine 10 mg nightly ADD still better with Concerta 72 mg versus Focalin. Propranolol 40 AM Topomax 100 BID 5-HTP supplement 200 mg daily No med changes today  04/02/23 appt noted: Meds as above, takes Xanax 0.25 mg on Sun afternoons bc anxious about work the next day.  Helps. Still happiest at home on couch but forces herself to get out and it usually works out fine.   Dep is usually ok and not like it was before at all. Concerta ok for attention. SE tremor and nothing else.  Plan no  changes  12/15/23 appt noted: Psych meds: Alprazolam 0.25 mg daily as needed, clomipramine 75 nightly, fluvoxamine 100 mg tablet, one half twice daily; Concerta 72 AM, olanzapine 10 nightly, propranolol 20 mg tablets 1-2 twice daily as needed tremor, topiramate 100 twice daily for headache, Imitrex as needed migraine 100 mg. Ok.  Very even keel.   Don't cry even when might feel like it.  Still worry about the same.  Not worse.  Fear of mistakes at work.  New owners of business.   Dep about 5/10 is about the same.  Not as interested in enjoyable activity as normal.   Might retire in June.   HA under control.  Numerous failed psychiatric medication trials include Lexapro 60 mg a day, Zoloft 250, Paxil with weight gain,  Fluvoxamine 100 Clomipramine 50,  (Could not tolerate fluvoxamine 100 or clomipramine 100 DT sleepiness Therefore continue fluvoxamine 50 mg twice daily and clomipramine 75 mg nightly)  Wellbutrin 300 with side effects,  mirtazapine Rexulti which helped initially,  Vraylar, Abilify between 1 and 2.5 mg and up to 15 mg with no response,  Latuda 80 failed Risperidone Olanzapine 15 sedation. Lamotrigine 300 NR buspirone 30 mg,  Cytomel,  lithium 625,  pramipexole with insomnia,   Lunesta with crying spells,  topiramate, Deplin, propranolol,   Ritalin, Focalin NR, Concerta 72 helped No history of Prozac, Viibryd.  Review of Systems:  Review of Systems  Constitutional:  Negative for appetite change, diaphoresis and unexpected weight change.  HENT:  Negative for tinnitus.   Cardiovascular:  Negative for palpitations.  Neurological:  Positive for tremors and headaches. Negative for dizziness and light-headedness.       Mouth  Psychiatric/Behavioral:  Negative for agitation, behavioral problems, confusion, decreased concentration, hallucinations, self-injury, sleep disturbance and suicidal ideas. The patient is nervous/anxious. The patient is not hyperactive.     Medications: I have reviewed the patient's current medications.  Current Outpatient Medications  Medication Sig Dispense Refill   ALPRAZolam (XANAX) 0.25 MG tablet Take 1 tablet (0.25 mg total) by mouth daily as needed for anxiety. 30 tablet 0   atorvastatin (LIPITOR) 20 MG tablet TAKE 1 TABLET BY MOUTH EVERY DAY 90 tablet 1   Calcium  Carbonate-Vitamin D (OSCAL 500/200 D-3 PO) Take by mouth.     Cholecalciferol (VITAMIN D PO) Take by mouth.     levothyroxine (SYNTHROID) 88 MCG tablet Take 1 tablet (88 mcg total) by mouth daily. 90 tablet 3   metoCLOPramide (REGLAN) 10 MG tablet SMARTSIG:1 Tablet(s) By Mouth 1-2 Times Daily     propranolol (INDERAL) 20 MG tablet TAKE 1-2 TABLETS BY MOUTH TWICE A DAY AS NEEDED FOR TREMORS 360 tablet 0   SUMAtriptan (IMITREX) 100 MG tablet Take 100 mg by mouth daily as needed for migraine.     topiramate (TOPAMAX) 100 MG tablet TAKE 1 TABLET BY MOUTH TWICE A DAY 180 tablet 0   clomiPRAMINE (ANAFRANIL) 75 MG capsule Take 1 capsule (75 mg total) by mouth at bedtime. 90 capsule 0   fluvoxaMINE (LUVOX) 50 MG tablet Take 1 tablet (50 mg total) by mouth 2 (two) times daily. 180 tablet 1   [START ON 01/12/2024] methylphenidate 36 MG PO CR tablet Take 1 tablet (36 mg total) by mouth daily. 60 tablet 0   methylphenidate 36 MG PO CR tablet Take 2 tablets (72 mg total) by mouth daily. 60 tablet 0   OLANZapine (ZYPREXA) 7.5 MG tablet Take 1 tablet (7.5 mg total) by mouth at bedtime. 90  tablet 0   No current facility-administered medications for this visit.    Medication Side Effects:  Watch jaw tremor  Allergies:  Allergies  Allergen Reactions   Ampicillin Rash    Past Medical History:  Diagnosis Date   Gum lesion    Biopsy done-benign   Hypercholesteremia    Hypothyroid    LGSIL (low grade squamous intraepithelial dysplasia) 2010   Migraines    OCD (obsessive compulsive disorder)    Osteoporosis 01/2016   T score -2.7    Family History  Problem Relation Age of Onset   Hypertension Mother    Heart disease Mother    Hypertension Father    Breast cancer Paternal Aunt        Age 32   Diabetes Neg Hx     Social History   Socioeconomic History   Marital status: Single    Spouse name: Not on file   Number of children: Not on file   Years of education: Not on file   Highest education  level: Not on file  Occupational History   Not on file  Tobacco Use   Smoking status: Never   Smokeless tobacco: Never  Vaping Use   Vaping status: Never Used  Substance and Sexual Activity   Alcohol use: Yes    Alcohol/week: 0.0 standard drinks of alcohol    Comment: wine occassionally   Drug use: No   Sexual activity: Not Currently    Birth control/protection: Post-menopausal    Comment: 1st intercourse 61 yo-5 partners  Other Topics Concern   Not on file  Social History Narrative   Not on file   Social Drivers of Health   Financial Resource Strain: Not on file  Food Insecurity: Not on file  Transportation Needs: Not on file  Physical Activity: Not on file  Stress: Not on file  Social Connections: Not on file  Intimate Partner Violence: Not on file    Past Medical History, Surgical history, Social history, and Family history were reviewed and updated as appropriate.   Please see review of systems for further details on the patient's review from today.   Objective:   Physical Exam:  LMP 11/19/2010   Physical Exam Constitutional:      General: She is not in acute distress.    Appearance: Normal appearance. She is well-developed.  Musculoskeletal:        General: No deformity.  Neurological:     Mental Status: She is alert and oriented to person, place, and time.     Motor: Tremor present.     Coordination: Coordination normal.     Comments: Mild mouth tremor and diminished blink rate may be more prominent.  Psychiatric:        Attention and Perception: She is attentive. She does not perceive auditory hallucinations.        Mood and Affect: Mood is anxious and depressed. Affect is not labile, blunt, angry or inappropriate.        Speech: Speech is not delayed.        Behavior: Behavior is not slowed.        Thought Content: Thought content is not paranoid or delusional. Thought content does not include homicidal or suicidal ideation. Thought content does not  include suicidal plan.        Cognition and Memory: Cognition normal.        Judgment: Judgment normal.     Comments: Insight intact. No auditory or visual hallucinations. No delusions. Less  checking. Gradually improving OCD Severe sx fimproved with both anxiety and depression.  But still dep 5/10.      Lab Review:     Component Value Date/Time   NA 141 05/16/2023 0750   K 4.1 05/16/2023 0750   CL 107 05/16/2023 0750   CO2 27 05/16/2023 0750   GLUCOSE 73 05/16/2023 0750   BUN 22 05/16/2023 0750   CREATININE 0.97 08/07/2023 0815   CALCIUM 9.5 05/16/2023 0750   PROT 7.0 05/16/2023 0750   ALBUMIN 4.4 05/16/2023 0750   AST 18 05/16/2023 0750   ALT 19 05/16/2023 0750   ALKPHOS 51 05/16/2023 0750   BILITOT 0.3 05/16/2023 0750   GFRNONAA >60 11/20/2021 0703       Component Value Date/Time   WBC 6.9 11/20/2021 0703   RBC 4.72 11/20/2021 0703   HGB 15.4 (H) 11/20/2021 0703   HCT 46.3 (H) 11/20/2021 0703   PLT 156 11/20/2021 0703   MCV 98.1 11/20/2021 0703   MCH 32.6 11/20/2021 0703   MCHC 33.3 11/20/2021 0703   RDW 13.1 11/20/2021 0703   LYMPHSABS 1.0 11/20/2021 0703   MONOABS 0.4 11/20/2021 0703   EOSABS 0.1 11/20/2021 0703   BASOSABS 0.0 11/20/2021 0703    No results found for: "POCLITH", "LITHIUM"   No results found for: "PHENYTOIN", "PHENOBARB", "VALPROATE", "CBMZ"   .res Assessment: Plan:    Nicci was seen today for follow-up, depression and anxiety.  Diagnoses and all orders for this visit:  Major depressive disorder, recurrent episode, moderate (HCC) -     OLANZapine (ZYPREXA) 7.5 MG tablet; Take 1 tablet (7.5 mg total) by mouth at bedtime.  Social anxiety disorder -     OLANZapine (ZYPREXA) 7.5 MG tablet; Take 1 tablet (7.5 mg total) by mouth at bedtime. -     clomiPRAMINE (ANAFRANIL) 75 MG capsule; Take 1 capsule (75 mg total) by mouth at bedtime.  Mixed obsessional thoughts and acts -     OLANZapine (ZYPREXA) 7.5 MG tablet; Take 1 tablet (7.5 mg  total) by mouth at bedtime. -     clomiPRAMINE (ANAFRANIL) 75 MG capsule; Take 1 capsule (75 mg total) by mouth at bedtime.  Attention deficit hyperactivity disorder (ADHD), predominantly inattentive type -     methylphenidate 36 MG PO CR tablet; Take 1 tablet (36 mg total) by mouth daily. -     methylphenidate 36 MG PO CR tablet; Take 2 tablets (72 mg total) by mouth daily.  Insomnia due to psychological stress  Tremor of face and hands  Migraine without aura and without status migrainosus, not intractable  Other orders -     fluvoxaMINE (LUVOX) 50 MG tablet; Take 1 tablet (50 mg total) by mouth 2 (two) times daily.    We discussed TRD and anxiety . she has a long list of failed psychiatric meds.  She has not tended to do well with higher dosages of SSRIs.  OCD handwashing & checking and dep under pretty good control. Overall she is satisfied with the current med regimen  Patient has been markedly depressed and anxious but, much better Since adding olanzapine 10 mg she has seen a significant improvement in depression and anxiety. Much better with clomipramine 75 HS  Chart review revealed that prior to starting treatment here patient had taken clomipramine in the past with some benefit but also with some side effects.  It is a tricyclic antidepressant distinctly different from SSRIs.  Some patients respond better to them.  She has a history  of previous benefit.   We discussed the value of doing blood levels with tricyclic antidepressants like clomipramine.  She agrees.  Lab 02/20/21 clomipramine 50 + Luvox 100 = clomip 195 + norclomip 111= total 306 (normal 220-500) Better with Increase clomipramine to 75 mg HS 02/11/2022 clomipramine level 183 history people, nor clomipramine level 89, total 272 on clomipramine 75 mg nightly and fluvoxamine 50 mg twice daily.  Disc dental risks and SE risks with meds.  HA managed  Could not tolerate fluvoxamine 100 BID or clomipramine 100 DT  sleepiness Therefore continue fluvoxamine 50 mg twice daily and clomipramine 75 mg nightly ADD still better with Concerta 72 mg versus Focalin. Continue propranolol 40 AM Ok prn Xanax on Sunday afternoon 5-HTP supplement 200 mg daily  Consider reduction in olanzapine bc apparent EPS though it was markedly helpful . Marland Kitchen She is continuing psychotherapy with Dr. Farrel Demark  Discussed potential metabolic side effects associated with atypical antipsychotics, as well as potential risk for movement side effects. Advised pt to contact office if movement side effects occur.   Mouth tremor appears unchanged since the switch from risperidone to olanzapine and no worse and maybe better.  We will continue to monitor.  Consider Auvelity for residual depression  Follow-up in  mos  Meredith Staggers MD, DFAPA  Future Appointments  Date Time Provider Department Center  12/23/2023  8:00 AM Robley Fries, PhD [facility] None  02/05/2024  8:00 AM [facility] None  02/12/2024  8:00 AM [place], [place], MD [facility] None    No orders of the defined types were placed in this encounter.      -------------------------------

## 2023-12-16 ENCOUNTER — Other Ambulatory Visit: Payer: Self-pay | Admitting: Psychiatry

## 2023-12-16 DIAGNOSIS — G43019 Migraine without aura, intractable, without status migrainosus: Secondary | ICD-10-CM | POA: Diagnosis not present

## 2023-12-23 ENCOUNTER — Ambulatory Visit: Payer: BC Managed Care – PPO | Admitting: Psychiatry

## 2023-12-23 DIAGNOSIS — F422 Mixed obsessional thoughts and acts: Secondary | ICD-10-CM

## 2023-12-23 DIAGNOSIS — F4010 Social phobia, unspecified: Secondary | ICD-10-CM | POA: Diagnosis not present

## 2023-12-23 DIAGNOSIS — F330 Major depressive disorder, recurrent, mild: Secondary | ICD-10-CM | POA: Diagnosis not present

## 2023-12-23 NOTE — Progress Notes (Unsigned)
 Psychotherapy Progress Note [facility] [name], PhD LP  Patient ID: Ashlee Chavez)    MRN: [id number] Therapy format: Individual psychotherapy Date: 12/23/2023      Start: 8:19a     Stop: 9:00a     Time Spent: 41 min Location: In-person   Session narrative (presenting needs, interim history, self-report of stressors and symptoms, applications of prior therapy, status changes, and interventions made in session) Engaged to [place] now, and not panicked but feeling it as if it's not real.  Does keep coming back to trust that "the rest of our lives" is actually OK, and of course self-consciousness about when she has a permanent roommate and no guarantee of solitude.  His annulment was granted, after having to deal with change of venue from [place] to [place], approved rather readily, without red tape.  They have done the Prepare-Enrich assessment, with which I am familiar, and have been through church-sponsored premarital counseling with a deacon, who will also be their officiant in a small ceremony.  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  Otherwise the profile indicated strong agreement and compatibility.  Affirmed and encouraged.  At work, the big news is that [employer] the business.  Taking on a new boss after all this time relatively comfortable and familiar, and having built a "normal" for so long, raises apprehensions, of course.  Might move up her time frame to retire from December to soon.  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  They have removed her access to some software as well, which seems to signal that she is at least considered probationary, possibly slated to let go in a  wholesale change meant to serve loyalty and their version of profitability.  Affirmed the long run she's had, and how she went from panicked refugee from 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    Therapeutic modalities: Cognitive Behavioral Therapy, Solution-Oriented/Positive Psychology, and Ego-Supportive  Mental Status/Observations:  Appearance:   Casual     Behavior:  Appropriate  Motor:  Normal  Speech/Language:   Clear and Coherent  Affect:  Appropriate  Mood:  anxious and more comfortable  Thought process:  normal  Thought content:    Worry, less  Sensory/Perceptual disturbances:    WNL  Orientation:  Fully oriented  Attention:  Good    Concentration:  Good  Memory:  WNL  Insight:    Good  Judgment:   Good  Impulse Control:  Good   Risk Assessment: Danger to Self: No Self-injurious Behavior: No Danger to Others: No Physical Aggression / Violence: No Duty to Warn: No Access to Firearms a concern: No  Assessment of progress:  progressing well  Diagnosis:   ICD-10-CM   1. 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 Social anxiety disorder  F40.10     3. Mixed obsessional thoughts and acts  F42.2      Plan:  For social anxiety and OCD attached to relationship -- Experiment further in letting [name] do things at the house, accompany her to things,  and repeat and progress in letting him see her feet and other aspects of her body which may be ego dystonic to her.  Practice letting it be strange but true that she commends the attention and interest of a kind and suitable man.  Practice letting it not have to be the same feeling she had with [name] to be legitimate love.  Self-affirm that being capable of living by herself is an asset, not cause for doubt, and that  strong, adolescent feelings of being in love are both more difficult at her age and suppressed by a range of things; her relationship with [name] has all the behavioral  hallmarks of being very strong and good, and the premarital inventory supports that.  To break down "too good to be true" feelings, road-test conflict -- broach the subject of "dealbreakers" and strong preferences, solicit what [name] would be, and work up assertive requests where needed (e.g., less talking during TV/movie).  The rest is about accommodating shared space and control after being so used to living alone and longsuffering; challenge to allow herself to dress down, and to take some amount of alone time while together, and to talk about it together, as practice for healthy transition to married life.  Practice noticing intrusive thoughts and asking herself how much she'd actually bet they're true if she put money on it. Work OCD -- Although worries about making mistakes on the job are much lower now, look for opportunities to challenge her worrying mind by coming up with mistakes that she can either imagine or "fake", and in any case look for a way to laugh about her own mind and intrusive worries.  For any feeling underutilized, recommend asking boss -- at interest -- for additional duties in slow weeks to make job more fulfilling and ego-syntonic.   CBT/SFT tactics for worry/anxiety/OCD -- Conceive of worrying voice as "[name]", her inner critic who is less of an enemy than an overworried friend.  Continue to seek serendipitous exposure opportunities to engage or let herself show and notice how it works out. Physiological self-care -- Maintain vitamin supplements, include omega-3 for anti-inflammatory benefits and brain health.  Work with timing on medication adjustments to maximize rest at night and alertness by day.  Lip tremor and psychomotor retardation, along with reported fatigue, could signal nutritional deficiencies, e.g.,  B1, B6, B12, D, E, or a medication-related issue -- work with psychiatry as needed. Longterm bereavement -- seek to notice and accept all grief feelings when they come, no judgment Other recommendations/advice -- As may be noted above.  Continue to utilize previously learned skills ad lib. Medication compliance -- Maintain medication as prescribed and work faithfully with relevant prescriber(s) if any changes are desired or seem indicated. Crisis service -- Aware of call list and work-in appts.  Call the clinic on-call service, 988/hotline, 911, or present to [facility] or ER if any life-threatening psychiatric crisis. Followup -- Return for time as available.  Next scheduled visit with me Visit date not found.  Next scheduled in this office 02/17/2024.  Robley Fries, PhD [name], PhD LP Clinical Psychologist, [facility] [address] [place], [place] [address] [phone]

## 2023-12-24 ENCOUNTER — Other Ambulatory Visit: Payer: Self-pay | Admitting: Psychiatry

## 2023-12-24 DIAGNOSIS — E638 Other specified nutritional deficiencies: Secondary | ICD-10-CM

## 2023-12-24 DIAGNOSIS — F332 Major depressive disorder, recurrent severe without psychotic features: Secondary | ICD-10-CM

## 2023-12-24 DIAGNOSIS — F422 Mixed obsessional thoughts and acts: Secondary | ICD-10-CM

## 2023-12-24 DIAGNOSIS — F4010 Social phobia, unspecified: Secondary | ICD-10-CM

## 2024-01-09 ENCOUNTER — Other Ambulatory Visit: Payer: Self-pay | Admitting: Psychiatry

## 2024-01-09 DIAGNOSIS — R251 Tremor, unspecified: Secondary | ICD-10-CM

## 2024-01-13 NOTE — Telephone Encounter (Signed)
 Prior authorization pending for quantity with [employer]

## 2024-01-15 ENCOUNTER — Other Ambulatory Visit: Payer: Self-pay

## 2024-01-15 MED ORDER — FLUVOXAMINE MALEATE 100 MG PO TABS: 50.0000 mg | ORAL_TABLET | Freq: Two times a day (BID) | ORAL | 0 refills | Status: DC

## 2024-01-15 NOTE — Telephone Encounter (Signed)
 Let her know insurance dictates change to 173m g tab 1/2 BID

## 2024-01-15 NOTE — Telephone Encounter (Signed)
 Submitted a PA but they denied it stating they cover a 100 mg tablet daily but not 50 mg twice daily with [employer]

## 2024-01-19 DIAGNOSIS — Z1231 Encounter for screening mammogram for malignant neoplasm of breast: Secondary | ICD-10-CM | POA: Diagnosis not present

## 2024-01-23 ENCOUNTER — Other Ambulatory Visit: Payer: Self-pay

## 2024-01-23 NOTE — Telephone Encounter (Signed)
 Patient request Methylphenidate ER 36 mg, but after reviewing Rx's and contacting pharmacy she had 1 Rx on file already and they are going to fill it now. Her last refill was 02/08  Pt was notified.

## 2024-01-27 ENCOUNTER — Other Ambulatory Visit: Payer: Self-pay | Admitting: Psychiatry

## 2024-01-27 DIAGNOSIS — G43009 Migraine without aura, not intractable, without status migrainosus: Secondary | ICD-10-CM

## 2024-01-29 ENCOUNTER — Other Ambulatory Visit: Payer: Self-pay

## 2024-01-29 DIAGNOSIS — E063 Autoimmune thyroiditis: Secondary | ICD-10-CM

## 2024-01-29 DIAGNOSIS — M810 Age-related osteoporosis without current pathological fracture: Secondary | ICD-10-CM

## 2024-01-30 ENCOUNTER — Other Ambulatory Visit: Payer: Self-pay

## 2024-01-30 DIAGNOSIS — F900 Attention-deficit hyperactivity disorder, predominantly inattentive type: Secondary | ICD-10-CM

## 2024-01-30 MED ORDER — METHYLPHENIDATE HCL ER (OSM) 36 MG PO TBCR: 72.0000 mg | EXTENDED_RELEASE_TABLET | Freq: Every day | ORAL | 0 refills | Status: DC

## 2024-02-05 ENCOUNTER — Other Ambulatory Visit: Payer: BC Managed Care – PPO

## 2024-02-05 DIAGNOSIS — E063 Autoimmune thyroiditis: Secondary | ICD-10-CM | POA: Diagnosis not present

## 2024-02-05 DIAGNOSIS — M810 Age-related osteoporosis without current pathological fracture: Secondary | ICD-10-CM | POA: Diagnosis not present

## 2024-02-06 ENCOUNTER — Encounter: Payer: Self-pay | Admitting: Endocrinology

## 2024-02-06 ENCOUNTER — Ambulatory Visit: Payer: BC Managed Care – PPO | Admitting: Psychiatry

## 2024-02-06 LAB — RENAL FUNCTION PANEL
Albumin: 4.6 g/dL (ref 3.6–5.1)
BUN: 16 mg/dL (ref 7–25)
CO2: 26 mmol/L (ref 20–32)
Calcium: 9.6 mg/dL (ref 8.6–10.4)
Chloride: 110 mmol/L (ref 98–110)
Creat: 0.87 mg/dL (ref 0.50–1.05)
Glucose, Bld: 96 mg/dL (ref 65–99)
Phosphorus: 3.8 mg/dL (ref 2.5–4.5)
Potassium: 4.5 mmol/L (ref 3.5–5.3)
Sodium: 143 mmol/L (ref 135–146)

## 2024-02-06 LAB — TSH: TSH: 0.21 m[IU]/L — ABNORMAL LOW (ref 0.40–4.50)

## 2024-02-06 LAB — T4, FREE: Free T4: 1.5 ng/dL (ref 0.8–1.8)

## 2024-02-06 NOTE — Progress Notes (Deleted)
 Psychotherapy Progress Note [facility] [name], PhD LP  Patient ID: VANIAH CHAMBERS)    MRN: [id number] Therapy format: {Therapy Types:21967::"Individual psychotherapy"} Date: 02/06/2024      Start: ***:***     Stop: ***:***     Time Spent: *** min Location: {SvcLoc:22530::"In-person"}   Session narrative (presenting needs, interim history, self-report of stressors and symptoms, applications of prior therapy, status changes, and interventions made in session) ***  Therapeutic modalities: {AM:23362::"Cognitive Behavioral Therapy","Solution-Oriented/Positive Psychology"}  Mental Status/Observations:  Appearance:   {PSY:22683}     Behavior:  {PSY:21022743}  Motor:  {PSY:22302}  Speech/Language:   {PSY:22685}  Affect:  {PSY:22687}  Mood:  {PSY:31886}  Thought process:  {PSY:31888}  Thought content:    {PSY:[phone]}  Sensory/Perceptual disturbances:    {PSY:2404375704}  Orientation:  {Psych Orientation:23301::"Fully oriented"}  Attention:  {Good-Fair-Poor ratings:23770::"Good"}    Concentration:  {Good-Fair-Poor ratings:23770::"Good"}  Memory:  {[phone]}  Insight:    {Good-Fair-Poor ratings:23770::"Good"}  Judgment:   {Good-Fair-Poor ratings:23770::"Good"}  Impulse Control:  {Good-Fair-Poor ratings:23770::"Good"}   Risk Assessment: Danger to Self: {Risk:22599::"No"} Self-injurious Behavior: {Risk:22599::"No"} Danger to Others: {Risk:22599::"No"} Physical Aggression / Violence: {Risk:22599::"No"} Duty to Warn: {AMYesNo:22526::"No"} Access to Firearms a concern: {AMYesNo:22526::"No"}  Assessment of progress:  {Progress:22147::"progressing"}  Diagnosis: No diagnosis found. Plan:  *** Other recommendations/advice -- As may be noted above.  Continue to utilize previously learned skills ad lib. Medication compliance -- Maintain medication as prescribed and work faithfully with relevant prescriber(s) if any changes are desired or seem  indicated. Crisis service -- Aware of call list and work-in appts.  Call the clinic on-call service, 988/hotline, 911, or present to [facility] or ER if any life-threatening psychiatric crisis. Followup -- No follow-ups on file.  Next scheduled visit with me 02/16/2024.  Next scheduled in this office 02/16/2024.  Robley Fries, PhD [name], PhD LP Clinical Psychologist, [facility] [address] [place], [place] [address] [phone]

## 2024-02-06 NOTE — Progress Notes (Signed)
 No-show/Short-notice cancellation note [name], PhD, [facility]  Patient ID: LARRAINE ARGO     MRN: [id number]     Date: 02/06/2024     Appt time: 11am  Failed to show for 11a appointment, very uncharacteristically.  Courtesy call from staff, says he had it down for next week by mistake.  Waive fee professional courtesy due to extensive track record of error-free attendance, RS to 4/7 acc. to staff.  Robley Fries, PhD [name], PhD LP Clinical Psychologist, [facility] [address] [place], [place] [address] [phone]

## 2024-02-12 ENCOUNTER — Encounter: Payer: Self-pay | Admitting: Endocrinology

## 2024-02-12 ENCOUNTER — Ambulatory Visit: Payer: BC Managed Care – PPO | Admitting: Endocrinology

## 2024-02-12 VITALS — BP 118/70 | HR 72 | Resp 16 | Ht 63.0 in | Wt 121.6 lb

## 2024-02-12 DIAGNOSIS — E7800 Pure hypercholesterolemia, unspecified: Secondary | ICD-10-CM | POA: Diagnosis not present

## 2024-02-12 DIAGNOSIS — M810 Age-related osteoporosis without current pathological fracture: Secondary | ICD-10-CM | POA: Diagnosis not present

## 2024-02-12 DIAGNOSIS — E063 Autoimmune thyroiditis: Secondary | ICD-10-CM | POA: Diagnosis not present

## 2024-02-12 MED ORDER — ATORVASTATIN CALCIUM 20 MG PO TABS: 20.0000 mg | ORAL_TABLET | Freq: Every day | ORAL | 3 refills | Status: AC

## 2024-02-12 MED ORDER — LEVOTHYROXINE SODIUM 75 MCG PO TABS: 75.0000 ug | ORAL_TABLET | Freq: Every day | ORAL | 3 refills | Status: AC

## 2024-02-12 NOTE — Progress Notes (Signed)
 Outpatient Endocrinology Note Ashlee Vernita Tague, MD  02/12/24  Patient's Name: Ashlee Chavez    DOB: [birth year]    MRN: [id number]  REASON OF VISIT: Follow-up for hypothyroidism /osteoporosis  PCP: Lorenda Ishihara, MD  HISTORY OF PRESENT ILLNESS:   Ashlee Chavez is a 63 y.o. old female with past medical history as listed below is presented for a follow up of hypothyroidism /osteoporosis.   Pertinent Thyroid History: - Patient was diagnosed with hypothyroidism  in 1996.  She has positive thyroid peroxidase antibody consistent with Hashimoto's thyroiditis.  She has been on thyroxine since 1996.  Levothyroxine dose has been adjusted multiple times over time.  She does have a history of a small goiter previously.   -In July 2024 levothyroxine dose was decreased from 100 mcg to 88 mcg daily when TSH was low 0.06.  # Osteoporosis: -She had bone mineral density in 2017 showing T-score at femoral neck -2.7, previously -1.8 and a spine -1.9, by complications she had a decline of 8 to 10% in her bone density from 2 years before and 15% resolved from baseline.  She could not 8 4 every*and did not want to consider Reclast previously because of cost.  In July 2017 she was started on treatment with risedronate 150 mg for her declining bone density.  She completed 5 years regimen and is stopped in August 2022.  She was started on Reclast after her T-score at the femoral neck was -2.6 on DEXA scan in May 2023.   BONE density: Previous study done by gynecologist in April 2021 shows T-score of the femoral neck to be -2.4 compared to -2.7 In 03/2022 with T score at the femoral neck is -2.6  She has been on calcium supplement regular on Os-Cal.  Is been taking vitamin D3 5000 international unit daily with normal vitamin D level consistently.  First dose of Reclast infusion was in August 2023, second dose of Reclast in July 11, 2023.  Interval history  Patient has been taking levothyroxine 88 mcg  daily.  Denies palpitation or heat intolerance.  Recent laboratory result with low TSH and normal free T4.  She has been taking calcium and vitamin D supplement.  She has no fall and fracture.  No other complaints today.  Latest Reference Range & Units 02/05/24 08:05  TSH 0.40 - 4.50 mIU/L 0.21 (L)  T4,Free(Direct) 0.8 - 1.8 ng/dL 1.5  (L): Data is abnormally low  REVIEW OF SYSTEMS:  As per history of present illness.   PAST MEDICAL HISTORY: Past Medical History:  Diagnosis Date   Gum lesion    Biopsy done-benign   Hypercholesteremia    Hypothyroid    LGSIL (low grade squamous intraepithelial dysplasia) 2010   Migraines    OCD (obsessive compulsive disorder)    Osteoporosis 01/2016   T score -2.7    PAST SURGICAL HISTORY: Past Surgical History:  Procedure Laterality Date   basal cell excised N/A    BREAST BIOPSY Right    Benign   BREAST LUMPECTOMY  2001   right-benign   CATARACT EXTRACTION  2005   Gum Biopsy     Benign-2019   IR ANGIO EXTERNAL CAROTID SEL EXT CAROTID UNI R MOD SED  11/20/2021   IR ANGIO INTRA EXTRACRAN SEL INTERNAL CAROTID BILAT MOD SED  11/20/2021   IR ANGIO VERTEBRAL SEL VERTEBRAL BILAT MOD SED  11/20/2021    ALLERGIES: Allergies  Allergen Reactions   Ampicillin Rash    FAMILY HISTORY:  Family History  Problem Relation Age of Onset   Hypertension Mother    Heart disease Mother    Hypertension Father    Breast cancer Paternal Aunt        Age 49   Diabetes Neg Hx     SOCIAL HISTORY: Social History   Socioeconomic History   Marital status: Single    Spouse name: Not on file   Number of children: Not on file   Years of education: Not on file   Highest education level: Not on file  Occupational History   Not on file  Tobacco Use   Smoking status: Never   Smokeless tobacco: Never  Vaping Use   Vaping status: Never Used  Substance and Sexual Activity   Alcohol use: Yes    Alcohol/week: 0.0 standard drinks of alcohol    Comment:  wine occassionally   Drug use: No   Sexual activity: Not Currently    Birth control/protection: Post-menopausal    Comment: 1st intercourse 60 yo-5 partners  Other Topics Concern   Not on file  Social History Narrative   Not on file   Social Drivers of Health   Financial Resource Strain: Not on file  Food Insecurity: Not on file  Transportation Needs: Not on file  Physical Activity: Not on file  Stress: Not on file  Social Connections: Not on file    MEDICATIONS:  Current Outpatient Medications  Medication Sig Dispense Refill   ALPRAZolam (XANAX) 0.25 MG tablet Take 1 tablet (0.25 mg total) by mouth daily as needed for anxiety. 30 tablet 0   Calcium Carbonate-Vitamin D (OSCAL 500/200 D-3 PO) Take by mouth.     Cholecalciferol (VITAMIN D PO) Take by mouth.     clomiPRAMINE (ANAFRANIL) 75 MG capsule Take 1 capsule (75 mg total) by mouth at bedtime. 90 capsule 0   fluvoxaMINE (LUVOX) 100 MG tablet Take 0.5 tablets (50 mg total) by mouth 2 (two) times daily. 90 tablet 0   methylphenidate 36 MG PO CR tablet Take 2 tablets (72 mg total) by mouth daily. 60 tablet 0   methylphenidate 36 MG PO CR tablet Take 2 tablets (72 mg total) by mouth daily. 60 tablet 0   metoCLOPramide (REGLAN) 10 MG tablet as needed.     OLANZapine (ZYPREXA) 7.5 MG tablet Take 1 tablet (7.5 mg total) by mouth at bedtime. 90 tablet 0   propranolol (INDERAL) 20 MG tablet TAKE 1-2 TABLETS BY MOUTH TWICE A DAY AS NEEDED FOR TREMORS 360 tablet 0   SUMAtriptan (IMITREX) 100 MG tablet Take 100 mg by mouth daily as needed for migraine.     topiramate (TOPAMAX) 100 MG tablet TAKE 1 TABLET BY MOUTH TWICE A DAY 180 tablet 0   atorvastatin (LIPITOR) 20 MG tablet Take 1 tablet (20 mg total) by mouth daily. 90 tablet 3   levothyroxine (SYNTHROID) 75 MCG tablet Take 1 tablet (75 mcg total) by mouth daily. 90 tablet 3   No current facility-administered medications for this visit.    PHYSICAL EXAM: Vitals:   02/12/24 0807   BP: 118/70  Pulse: 72  Resp: 16  SpO2: 96%  Weight: 121 lb 9.6 oz (55.2 kg)  Height: 5\' 3"  (1.6 m)    Body mass index is 21.54 kg/m.  Wt Readings from Last 3 Encounters:  02/12/24 121 lb 9.6 oz (55.2 kg)  08/14/23 119 lb 6.4 oz (54.2 kg)  07/11/23 118 lb 12.8 oz (53.9 kg)     General: Well developed, well nourished female  in no apparent distress.  HEENT: AT/[place], no external lesions. Hearing intact to the spoken word Eyes:  Conjunctiva clear and no icterus. Neck: Trachea midline, neck supple without appreciable thyromegaly or lymphadenopathy and no palpable thyroid nodules Lungs: Clear to auscultation, no wheeze. Respirations not labored Heart: S1S2, Regular in rate and rhythm. No loud murmurs Abdomen: Soft, non tender Neurologic: Alert, oriented, normal speech, deep tendon biceps reflexes normal Extremities: No pedal pitting edema, no tremors of outstretched hands Skin: Warm, color good.  Psychiatric: Does not appear depressed or anxious  PERTINENT HISTORIC LABORATORY AND IMAGING STUDIES:  All pertinent laboratory results were reviewed. Please see HPI also for further details.   TSH  Date Value Ref Range Status  02/05/2024 0.21 (L) 0.40 - 4.50 mIU/L Final  08/07/2023 0.38 0.35 - 5.50 uIU/mL Final  05/16/2023 0.06 (L) 0.35 - 5.50 uIU/mL Final     ASSESSMENT / PLAN  1. Acquired autoimmune hypothyroidism   2. Pure hypercholesterolemia   3. Age-related osteoporosis without current pathological fracture     -Hypothyroidism: She has longstanding history of hypothyroidism on thyroid hormone replacement.  Hypothyroidism due to Hashimoto's thyroiditis. -She is currently taking levothyroxine 88 mcg daily.  Recent thyroid low TSH. -Decrease levothyroxine to 75 mcg daily. -Check thyroid function test TSH, free T4 in 2 months.  # Osteoporosis -She was treated with risedronate for 5 years from 2017-2022.  Reclast was started , first dose was in August 2023 and second dose was in  August 2024. -Last DEXA scan was in May 2023, T-score of -2.6 at femoral neck.  Plan: -Continue Reclast 5 mg IV annually. -Plan for DEXA scan in May. -Provided bone mineral density is stable, will consider drug holiday after Reclast infusion next year. -Continue current dose of vitamin D and calcium supplement. -Discussed about fall precaution and weightbearing exercise as tolerated.   Diagnoses and all orders for this visit:  Acquired autoimmune hypothyroidism -     levothyroxine (SYNTHROID) 75 MCG tablet; Take 1 tablet (75 mcg total) by mouth daily. -     T4, free -     TSH  Pure hypercholesterolemia -     atorvastatin (LIPITOR) 20 MG tablet; Take 1 tablet (20 mg total) by mouth daily.  Age-related osteoporosis without current pathological fracture     DISPOSITION Follow up in clinic in 6 months suggested.  Lab visit in 2 months.  Will plan for additional lab prior to follow-up visit in 6 months after 86-month lab.  All questions answered and patient verbalized understanding of the plan.  Ashlee Leafy Motsinger, MD [facility] Endocrinology [facility] [address] [place], [address] [place], [place] [address] Phone # [phone]  At least part of this note was generated using voice recognition software. Inadvertent word errors may have occurred, which were not recognized during the proofreading process.

## 2024-02-16 ENCOUNTER — Ambulatory Visit: Admitting: Psychiatry

## 2024-02-16 DIAGNOSIS — F422 Mixed obsessional thoughts and acts: Secondary | ICD-10-CM | POA: Diagnosis not present

## 2024-02-16 DIAGNOSIS — F330 Major depressive disorder, recurrent, mild: Secondary | ICD-10-CM | POA: Diagnosis not present

## 2024-02-16 DIAGNOSIS — F4010 Social phobia, unspecified: Secondary | ICD-10-CM | POA: Diagnosis not present

## 2024-02-16 NOTE — Progress Notes (Signed)
 Psychotherapy Progress Note [facility] [name], PhD LP  Patient ID: Ashlee Chavez)    MRN: [id number] Therapy format: Individual psychotherapy Date: 02/16/2024      Start: 10:05a     Stop: 10:54a     Time Spent: 49 min Location: In-person   Session narrative (presenting needs, interim history, self-report of stressors and symptoms, applications of prior therapy, status changes, and interventions made in session) Anxiety associated with work, retirement, wedding, and approaching prospect of living together first time in life.  Having stomach upset, probably for the nerves of it all, though she has been taking Mylanta and "sick" foods like Coke and crackers.  Probed whether her GI sxs are all about anxiety or does she believe she has a stomach lining issue at this point.  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   Billing being lifted from her soon, certain computer systems already locked out of, so all signs point to termination pretty soon.  Feels a little scary, but really, figures more relief than that -- from boredom, and from worries about doing things correctly --  though the absence of those worries may itself be unsettling, as she has become as used to having them as she is used to this workplace.  On the likelihood her prediction is right, probed what she fears and what she would like to have ready for day 1 of retirement.  Knows she wants to rejoin the gym, get physically more active again.  Has a stack of books literally set aside to read and knows she can read for hours without boredom.  Can take a walk, too, or clean out closet, or get a dog.  Encouraged to make herself a "cheat sheet" to look at in case of terrifying boredom and  forgetting what she has already made available for herself, and share it with [name] both to provide herself further help knowing her own plan and as "helpmate practice" for him.  Wedding not yet set but still plan a small civil service.  [name] has asserted they will have fully commingled finances, which feels like a monumental change after over 4 decades accounting only to herself.  Encouraged to begin experiments in sharing finances.  Says she tapped out on her last paycheck and had $100 in prescriptions to pick up; price surprised [name] (high deductible plan) and he offered to cover her, but she declined.  Allowed that she can continue 100% self-sufficient finances if she likes, but it could be good practice letting the other one help.  Offers no resistance to the idea of conjoined banking, just says times come when she is frightened of what she's getting into and then times come when she knows she wouldn't want to live without him.  Affirmed that they seem to be very well-matched, he very understanding of her sensitivities, and full confidence that they will work out a new normal together.  [name] is recovering from his back surgery, staying with her on a prolonged basis.  It has been in its own way hard to do caregiving in the evening. But he has recovered enough not to need much, and it felt good to be useful that way.  Encouraged leaning into all such interdependencies, knowing it will basically feel unsettling  until it doesn't, and then it might "tickle".  Therapeutic modalities: Cognitive Behavioral Therapy, Solution-Oriented/Positive Psychology, Ego-Supportive, and Faith-sensitive  Mental Status/Observations:  Appearance:   Casual     Behavior:  Appropriate  Motor:  Normal exc jaw tremor  Speech/Language:   Clear and Coherent and slowed responses  Affect:  Appropriate  Mood:  anxious  Thought process:  normal  Thought content:    Obsessions  Sensory/Perceptual disturbances:    WNL   Orientation:  Fully oriented  Attention:  Good    Concentration:  Good  Memory:  WNL  Insight:    Variable  Judgment:   Good  Impulse Control:  Good   Risk Assessment: Danger to Self: No Self-injurious Behavior: No Danger to Others: No Physical Aggression / Violence: No Duty to Warn: No Access to Firearms a concern: No  Assessment of progress:  progressing  Diagnosis:   ICD-10-CM   1. 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 Social anxiety disorder  F40.10     3. Mixed obsessional thoughts and acts  F42.2      Plan:  Adjustment pressures -- Note and value the relief inherent in leaving a changed workplace.  Embrace retirement living with plan or short "menu" of things to do with suddenly unstructured time as well as things worth getting out of the house for when a job is not one of them.  Endorse plans to rejoin gym, read copiously, and walk.  For marriage upcoming, embrace the change as enlarging, not diminishing her life and autonomy. For social anxiety and OCD attached to relationship -- Experiment further in letting [name] do things at the house, accompany her to things, and repeat and progress in letting him see her feet and other aspects of her body which may be ego dystonic to her.  Practice letting it be strange but true that she commends the attention and interest of a kind and suitable man.  Practice letting it not have to be the same feeling she had with [name] to be legitimate love.  Self-affirm that being capable of living by herself is an asset, not cause for doubt, and that strong, adolescent feelings of being in love are both more difficult at her age and suppressed by a range of things; her relationship with [name] has all the behavioral  hallmarks of being very strong and good, and the premarital inventory supports that.  To break down "too good to be true" feelings, road-test conflict -- broach the subject of "dealbreakers" and strong preferences, solicit  what [name] would be, and work up assertive requests where needed (e.g., less talking during TV/movie).  The rest is about accommodating shared space and control after being so used to living alone and longsuffering; challenge to allow herself to dress down, and to take some amount of alone time while together, and to talk about it together, as practice for healthy transition to married life.  Practice noticing intrusive thoughts and asking herself how much she'd actually bet they're true if she put money on it. Work OCD -- Although worries about making mistakes on the job are much lower now, look for opportunities to challenge her worrying mind by coming up with mistakes that she can either imagine or "fake", and in any case look for a way to laugh about her own mind and intrusive worries.  For any feeling underutilized, recommend asking boss -- at interest -- for additional duties in slow weeks to make job more  fulfilling and ego-syntonic.   CBT/SFT tactics for worry/anxiety/OCD -- Conceive of worrying voice as "[name]", her inner critic who is less of an enemy than an overworried friend.  Continue to seek serendipitous exposure opportunities to engage or let herself show and notice how it works out. Physiological self-care -- Maintain vitamin supplements, include omega-3 for anti-inflammatory benefits and brain health.  Work with timing on medication adjustments to maximize rest at night and alertness by day.  Lip tremor and psychomotor retardation, along with reported fatigue, could signal nutritional deficiencies, e.g., B1, B6, B12, D, E, or a medication-related issue -- work with psychiatry as needed. Longterm bereavement -- seek to notice and accept all grief feelings when they come, no judgment Other recommendations/advice -- As may be noted above.  Continue to utilize previously learned skills ad lib. Medication compliance -- Maintain medication as prescribed and work faithfully with relevant  prescriber(s) if any changes are desired or seem indicated. Crisis service -- Aware of call list and work-in appts.  Call the clinic on-call service, 988/hotline, 911, or present to [facility] or ER if any life-threatening psychiatric crisis. Followup -- Return for time as already scheduled.  Next scheduled visit with me Visit date not found.  Next scheduled in this office 02/17/2024.  Robley Fries, PhD [name], PhD LP Clinical Psychologist, [facility] [address], [address] [place], [place] [address] [phone]

## 2024-02-17 ENCOUNTER — Encounter: Payer: Self-pay | Admitting: Psychiatry

## 2024-02-17 ENCOUNTER — Ambulatory Visit: Payer: BC Managed Care – PPO | Admitting: Psychiatry

## 2024-02-17 DIAGNOSIS — F422 Mixed obsessional thoughts and acts: Secondary | ICD-10-CM

## 2024-02-17 DIAGNOSIS — F4010 Social phobia, unspecified: Secondary | ICD-10-CM | POA: Diagnosis not present

## 2024-02-17 DIAGNOSIS — F900 Attention-deficit hyperactivity disorder, predominantly inattentive type: Secondary | ICD-10-CM

## 2024-02-17 DIAGNOSIS — F331 Major depressive disorder, recurrent, moderate: Secondary | ICD-10-CM | POA: Diagnosis not present

## 2024-02-17 MED ORDER — METHYLPHENIDATE HCL ER (OSM) 36 MG PO TBCR: 72.0000 mg | EXTENDED_RELEASE_TABLET | Freq: Every day | ORAL | 0 refills | Status: DC

## 2024-02-17 MED ORDER — OLANZAPINE 5 MG PO TABS: 5.0000 mg | ORAL_TABLET | Freq: Every day | ORAL | 0 refills | Status: DC

## 2024-02-17 NOTE — Progress Notes (Signed)
 Ashlee Chavez [id number] Jan 30, 1961 63 y.o.  Subjective:   Patient ID:  Ashlee Chavez is a 63 y.o. (DOB 1961-01-07) female.  Chief Complaint:  Chief Complaint  Patient presents with   Follow-up   Depression   Anxiety   Medication Problem    Ashlee Chavez presents today for follow-up of recently worse depression DT relationship loss.    When seen February 10, 2019.  She had received some benefit from Ritalin 20 mg 3 times daily but was having issues with the crash.  We switched to Concerta 72 mg every morning.  September 14, 2019.  She was having persistent bothersome OCD and was interested in further medicine changes to try to help with these symptoms because she has aggressively pursued therapy and still has significant symptoms.  She has been on multiple SSRIs and so we initiated augmentation with risperidone half milligram daily to increase to 1 mg daily. Sig improvement with risperidone.  SE low appetite with it.  Not hungry for dinner. Sleep so much better also.  Improvement is "marked".  Think better bc less obsessing time.  Not constant now.  Noticed benefit within the first week.  Less obsessing lessened depression.  But residual symptoms.  visit October 14, 2019.  She was encouraged to experiment with the dosing of the risperidone up to 2 mg or 3 mg nightly to see if she could get additional reduction in her chronic anxiety and OCD.   seen December 15, 2019.  Settled at 2 mg risperidone bc increased sleep.  Sleep is so much better.  Harder to get up in the morning.   Found out insurance won't cover Concerta anymore.  Not as good on Ritalin TID.  Not as even a response.  Terrible insurance. Therefore the only med change with switching to Focalin to see if it would be covered better.  Appt 03/09/20, switch to Concerta 72 mg is better with better mood and motivation vs Focalin.  Focus is also better.  Very poor function with Focalin and better with Concerta.   Better overall with  more even response to stimulant and better appetite.  No SE.  Duration all day.  Less desperate hopeless feelings on the stimulant.  Stressful times. With increase risperidone a little better mood bump.  SE a little shakiness noticed in jaw.   Not unusual levels of anxiety.  Lost her best friend 1 year ago and still grieving.  Not much family. Alone.  Not close to B and S who live elsewhere.  Talks to God daily.  Gets to work daily.  Function is good there.  Reads and calls people.  OCD no worse with Covid. Did join a Bible study which was a huge step. Plan start lamotrigine   04/04/20 appt with the following noted: Appt moved up.  She thought she was supposed to stop risperidone when started lamotrigine.  Started having SI off the risperidone but wasn't sure if it was SE.  No risperidone 3 mg for 3 weeks.Anxiety shaking sweating crying all started right away.  Also trouble with sleep and can't eat.  Plan:Restart risperidone 1 mg now and another milligram in 2 to 3 hours if not feeling sufficient relief to get the full dose of 3 mg today.  Then resume 3 mg risperidone each evening. Restart lamotrigine 25 mg daily for 2 weeks and follow the previous instructions on increasing it.  05/11/2020 appointment the following is noted: Marked improvement back on risperidone 3 mg.  SE shakes jaw.  Doesn't affect typing. Started lamotrigine and up to 100 mg for 2 weeks.  Feels it's helped depression. Better mood and able to laugh.  Able to get pizza for Bible study and not cry. Apptetite is not great.  Working on it. Anxiety is better not gone. Plan: no med changes  07/26/20 appt with the following noted; Increased lamotrigine gradually to 200 mg daily over the phone 6 weeks ago.  No improvement really for depression. With it. CC depression.   Risperidone still helping anxiety. Plan: Increase lamotrigine to 300 mg daily..  09/14/2020 appointment with following noted: Started taper of lamotrigine DT  NR. Started latuda 20 mg daily for 10 days.  Tolerated fine.  No effects so far.. Main problem is still depression.  Anxiety is not too bad.  No energy, motivation, sleeeping a lot.  No sig SI. In past started risperidone for anxiety and depression.  Increase Latuda to 40 mg daily. Disc potential DDI with risperidone reducing effectiveness of  Latuda so reduc risperidone to 1.5 mg HS  10/09/2020 phone call from patient: Patient left a voicemail on Friday while we were out of office for the Holiday that she is not any better. She feels the cutting in the 1/2 of Risperidone has really increased her anxiety. She was instructed to call back if not any better but concerned that taking away the Risperidone will really exacerbate her anxiety.  MD response:  At the last appointment we increased Latuda to 40 mg daily and reduced risperidone to half tablet daily.  The goal was to use Latuda and eliminate risperidone.  However since her anxiety has worsened with the reduction in risperidone, I agree with her and she should take the full dose of risperidone as well as continue the Latuda 40 mg daily.  The [name] is intended to help depression.  11/23/2020 appointment with the following noted: Propranolol only once daily. No better with higher Latuda.  No SE either. No change in appetite which is poor.  Still struggling with depression and OCD is a little worse with changes at work. Off lamotrigine. Plan: 5-HTP supplement 200 mg daily  Increase Latuda to 60 mg  for 3 weeks, If no benefit and no SE increase to 80 mg daily with food.  ADD still better with Concerta 72 mg versus Focalin. Continue high dosage paroxetine 80 mg daily.  01/16/2021 appointment with following noted: Increase Latuda to 80 mg without benefit or SE.  Anxiety and depression with poor STM.  Food getting caught in her throat.  No change in eating.  No change in weight.   Anxious thoughts mostly about work even when not there.  Anxious  about work needed on the house she can't afford.  Enjoys reading but feels guilty that she should be doing something else.  More general anxiety than OCD. 5-HTP supplement 200 mg daily Wean Latuda over a week DT NR Reduce mirtazapine to 30 mg HS  ADD still better with Concerta 72 mg versus Focalin. Start clomipramine 25 mg capsules 1 at night for 1 week, Then increase clomipramine to 2 capsules at night and reduce paroxetine to 1-1/2 tablets or 60 mg daily. Wait 1 week and then get the blood test   03/15/2021 appt noted: OK not great.  About the same.  Not much change in the last 8 weeks. SE none Lab 02/20/21 clomipramine 50 + Luvox 100 = clomip 195 + norclomip 111= total 306 Losing weight and no appetite and  hard to eat. Weight loss to 107#. A lot of anxiety and depression   Sleep less sound with less mirtazapine.  Plan: Due to the severity of symptoms,Add olanzapine 5 mg tablet 1 at night Stop mirtazapine Reduce risperidone to 1/2 tablet for 1 week then stop it.  Call if SI recurs.  03/30/2021 appointment with the following noted: It makes me a little tired but so far anxiety is improved.  No problems off risperidone.  Appetite is not better but did get protein bars and is eating.   Less panic.  But still occurs.  Things just started this week to be a little better.  Not huge but it's a start.  A little more energy. Sleep 10 hours but that's unchanged with olanzapine. First time I've had hope in a long time. Plan: A little better with olanzapine 5 mg HS vs risperidone 3 mg. Increase olanzapine to 10 mg HS  Call in 2 weeks if you are not seeing progressive improvement  04/12/2021 appointment with the following noted: Better with anxiety.  Sleep better.  Daytime could nap if not busy.  Otherwise OK.  Not much change in appetite.  Up to 110# about 2.5# better and trying to get protein twice daily.   A little change in OCD.   Depression is better some and staying up later and doing more than  she was.  Doing some things after dinner which is huge.  Big change with depression.  Not retreating to bed as quickly. Plan: continue meds.  05/18/21 appt noted: Further improvement in anxiety.  I feel better but still not going out but not excessively sleeping.  Acting more normally, eating better.  Eating dinner again.  Sleep reduced to 8 hours. SE dry mouth and a little hungrier. Asks about going up on olanzapine to further reduce anxiety. [name] and describes better from 10/10 to 4/10 for anxiety and depression down to 3/10.Marland Kitchen  Anxiety symptoms include: Excessive Worry, Obsessive Compulsive Symptoms:   Handwashing,,.  Pt reports no sleep issues. Pt reports that appetite is good. Pt reports that energy is good and down slightly. Concentration is no change. Suicidal thoughts:  denied by patient. Plan increase olanzapine to 15 mg HS DT partial benefit  06/22/21 appt noted: Too tired and sleepy with 15 mg olanzapine and reduced the dose to 10 mg daily. No other med changes. Overall about the same.  Don't do anything different.  Happy that it's better than it was stays up to 930 until 6 PM.  Don't go out except Bible study and church once weekly. Goes to grocery early to avoid people. Anxiety lately worse than the depression. Dx pulsatile tinnitus Plan: cut olanzapine back to 10 mg HS Increase clomipramine to 75 mg HS  08/13/2021 appointment with the following noted: Anxiety is "good" after increase clomipramine.  Used to be desperate to get home and not now.  Was thinking of disability and now it's better.  I texted a guy on a dating site first time in 2 years.  It's a big deal.  Started exercising a little.   SE no worse with increase clomipramine. Depression is better too and manageable with brief downturns.   Sleep is pretty good. Plan no med changes  10/30/2021 appt noted: Pretty good and a little better than last time.  Still getting out a little  with a guy she met on dating site and it's going ok.  Still prefers to be  home.  Still get anxious going out. Overall best response so far with any med for anxiety.   Depression is manageable. SE is OK Staying up more appropriately late and sleep is OK. Plan : Much better with clomipramine 75 HS Also continue olanzapine 10 HS & fluvoxamine 50 Benefit Concerta 72 mg a.m. Taking propranolol 20 BID for tremor No med changes  01/29/2022 appointment with the following noted: Pretty good and probably doing better. Handled triggers with less obsessions.  Had to have bx breast today and hasn't been obsessing like she would ususally.  Better than she ever expected. Surprising. Feels the med has hit it's stride at this point. No sig caffeine.  Tremor managed. Depresion managed. Concerta is fine without problems.  Not noticeable when it wears off. Sleep good lately. Yes increase fluvoxamine to 100 mg HS and 50 mg AM Repeat clomipramine level .    02/11/22 lab noted: She is now on fluvoxamine 150 mg daily and clomipramine 75 mg daily.  It is known that fluvoxamine can affect the blood level of clomipramine but that is not a problem in her case.  We increased the fluvoxamine at her last appointment on January 29, 2022 to the current dosage.  Let her know there is room to increase either fluvoxamine to 200 mg daily which can be split between morning and evening if desired or taken all at night if desired.  Or the other alternative if she prefers is to increase clomipramine to 100 mg at night.  Either 1 of those options may help further decrease anxiety and obsessions.  However if she would prefer to keep the dosage the same that is okay as well. Meredith Staggers, MD, DFAPA  04/30/22 appt noted: Tried increasing clomipramine and then fluvoxamine and it made her too sleepy. Depression managed. Still a little OCD and anxiety but pretty much managed unless triggers. Getting out socially and dating which causes  anxiety but doing OK. Tolerating meds ok now. Sleep 9-5.  Likes to nap but not usually. No SI in a long time. Plan: Could not tolerate fluvoxamine 100 or clomipramine 100 DT sleepiness Therefore continue fluvoxamine 50 mg twice daily and clomipramine 75 mg nightly Continue olanzapine 10 mg nightly ADD still better with Concerta 72 mg versus Focalin. 5-HTP supplement 200 mg daily No med changes today . . 10/01/2022 appointment noted: Continues same med.  Insurance won't pay for more than 1 tablet daily fluvoxamine. SE some tiredness but sleeps well. Manageable depression .  Anxiety better with meds but not gone.   Concerta still helping. Plan: Therefore continue fluvoxamine 50 mg twice daily and clomipramine 75 mg nightly Continue olanzapine 10 mg nightly ADD still better with Concerta 72 mg versus Focalin. Propranolol 40 AM Topomax 100 BID 5-HTP supplement 200 mg daily No med changes today  04/02/23 appt noted: Meds as above, takes Xanax 0.25 mg on Sun afternoons bc anxious about work the next day.  Helps. Still happiest at home on couch but forces herself to get out and it usually works out fine.   Dep is usually ok and not like it was before at all. Concerta ok for attention. SE tremor and nothing else.  Plan no changes  12/15/23 appt noted: Psych meds: Alprazolam 0.25 mg daily as needed, clomipramine 75 nightly, fluvoxamine 100 mg tablet, one half twice daily; Concerta 72 AM, olanzapine 10 nightly, propranolol 20 mg tablets 1-2 twice daily as needed tremor, topiramate 100 twice daily for headache, Imitrex as needed migraine  100 mg. Ok.  Very even keel.  Don't cry even when might feel like it.  Still worry about the same.  Not worse.  Fear of mistakes at work.  New owners of business.   Dep about 5/10 is about the same.  Not as interested in enjoyable activity as normal.   Might retire in June.   HA under control. Plan: reduc olanzapine to 7.5 mg HS.  02/17/24 appt noted: Med:  reduced olanzapine 5 mg HS. Others same. Notices a little more emotional variability but still blunted more than she would like.  Nothing got worse. Will find out in May if they are going to retire her or not.   Anxiety is not sig worse.     Numerous failed psychiatric medication trials include Lexapro 60 mg a day, Zoloft 250, Paxil with weight gain,  Fluvoxamine 100 Clomipramine 50,  (Could not tolerate fluvoxamine 100 or clomipramine 100 DT sleepiness Therefore continue fluvoxamine 50 mg twice daily and clomipramine 75 mg nightly)  Wellbutrin 300 with side effects,  mirtazapine Rexulti which helped initially,  Vraylar, Abilify between 1 and 2.5 mg and up to 15 mg with no response,  Latuda 80 failed Risperidone Olanzapine 15 sedation. Lamotrigine 300 NR buspirone 30 mg,  Cytomel,  lithium 625,  pramipexole with insomnia,   Lunesta with crying spells,  topiramate, Deplin, propranolol,   Ritalin, Focalin NR, Concerta 72 helped No history of Prozac, Viibryd.  Review of Systems:  Review of Systems  Constitutional:  Negative for appetite change, diaphoresis and unexpected weight change.  HENT:  Negative for tinnitus.   Cardiovascular:  Negative for palpitations.  Neurological:  Positive for tremors and headaches. Negative for dizziness and light-headedness.       Mouth  Psychiatric/Behavioral:  Positive for depression. Negative for agitation, behavioral problems, confusion, decreased concentration, hallucinations, self-injury, sleep disturbance and suicidal ideas. The patient is nervous/anxious. The patient is not hyperactive.     Medications: I have reviewed the patient's current medications.  Current Outpatient Medications  Medication Sig Dispense Refill   ALPRAZolam (XANAX) 0.25 MG tablet Take 1 tablet (0.25 mg total) by mouth daily as needed for anxiety. 30 tablet 0   atorvastatin (LIPITOR) 20 MG tablet Take 1 tablet (20 mg total) by mouth daily. 90 tablet 3   Calcium  Carbonate-Vitamin D (OSCAL 500/200 D-3 PO) Take by mouth.     Cholecalciferol (VITAMIN D PO) Take by mouth.     clomiPRAMINE (ANAFRANIL) 75 MG capsule Take 1 capsule (75 mg total) by mouth at bedtime. 90 capsule 0   fluvoxaMINE (LUVOX) 100 MG tablet Take 0.5 tablets (50 mg total) by mouth 2 (two) times daily. 90 tablet 0   levothyroxine (SYNTHROID) 75 MCG tablet Take 1 tablet (75 mcg total) by mouth daily. 90 tablet 3   metoCLOPramide (REGLAN) 10 MG tablet as needed.     propranolol (INDERAL) 20 MG tablet TAKE 1-2 TABLETS BY MOUTH TWICE A DAY AS NEEDED FOR TREMORS 360 tablet 0   SUMAtriptan (IMITREX) 100 MG tablet Take 100 mg by mouth daily as needed for migraine.     topiramate (TOPAMAX) 100 MG tablet TAKE 1 TABLET BY MOUTH TWICE A DAY 180 tablet 0   [START ON 04/13/2024] methylphenidate 36 MG PO CR tablet Take 2 tablets (72 mg total) by mouth daily. 60 tablet 0   methylphenidate 36 MG PO CR tablet Take 2 tablets (72 mg total) by mouth daily. 60 tablet 0   OLANZapine (ZYPREXA) 5 MG tablet  Take 1 tablet (5 mg total) by mouth at bedtime. 90 tablet 0   No current facility-administered medications for this visit.    Medication Side Effects:  Watch jaw tremor  Allergies:  Allergies  Allergen Reactions   Ampicillin Rash    Past Medical History:  Diagnosis Date   Gum lesion    Biopsy done-benign   Hypercholesteremia    Hypothyroid    LGSIL (low grade squamous intraepithelial dysplasia) 2010   Migraines    OCD (obsessive compulsive disorder)    Osteoporosis 01/2016   T score -2.7    Family History  Problem Relation Age of Onset   Hypertension Mother    Heart disease Mother    Hypertension Father    Breast cancer Paternal Aunt        Age 20   Diabetes Neg Hx     Social History   Socioeconomic History   Marital status: Single    Spouse name: Not on file   Number of children: Not on file   Years of education: Not on file   Highest education level: Not on file  Occupational  History   Not on file  Tobacco Use   Smoking status: Never   Smokeless tobacco: Never  Vaping Use   Vaping status: Never Used  Substance and Sexual Activity   Alcohol use: Yes    Alcohol/week: 0.0 standard drinks of alcohol    Comment: wine occassionally   Drug use: No   Sexual activity: Not Currently    Birth control/protection: Post-menopausal    Comment: 1st intercourse 47 yo-5 partners  Other Topics Concern   Not on file  Social History Narrative   Not on file   Social Drivers of Health   Financial Resource Strain: Not on file  Food Insecurity: Not on file  Transportation Needs: Not on file  Physical Activity: Not on file  Stress: Not on file  Social Connections: Not on file  Intimate Partner Violence: Not on file    Past Medical History, Surgical history, Social history, and Family history were reviewed and updated as appropriate.   Please see review of systems for further details on the patient's review from today.   Objective:   Physical Exam:  LMP 11/19/2010   Physical Exam Constitutional:      General: She is not in acute distress.    Appearance: Normal appearance. She is well-developed.  Musculoskeletal:        General: No deformity.  Neurological:     Mental Status: She is alert and oriented to person, place, and time.     Motor: Tremor present.     Coordination: Coordination normal.     Comments: Mild mouth tremor and diminished blink rate may be more prominent.  Psychiatric:        Attention and Perception: She is attentive. She does not perceive auditory hallucinations.        Mood and Affect: Mood is anxious and depressed. Affect is blunt. Affect is not labile, angry or inappropriate.        Speech: Speech is not delayed.        Behavior: Behavior is not slowed.        Thought Content: Thought content is not paranoid or delusional. Thought content does not include homicidal or suicidal ideation. Thought content does not include suicidal plan.         Cognition and Memory: Cognition normal.        Judgment: Judgment normal.  Comments: Insight intact. No auditory or visual hallucinations. No delusions. Less checking. Gradually improving OCD Severe sx fimproved with both anxiety and depression.  But still dep 5/10.  More blunted than she would like     Lab Review:     Component Value Date/Time   NA 143 02/05/2024 0805   K 4.5 02/05/2024 0805   CL 110 02/05/2024 0805   CO2 26 02/05/2024 0805   GLUCOSE 96 02/05/2024 0805   BUN 16 02/05/2024 0805   CREATININE 0.87 02/05/2024 0805   CALCIUM 9.6 02/05/2024 0805   PROT 7.0 05/16/2023 0750   ALBUMIN 4.4 05/16/2023 0750   AST 18 05/16/2023 0750   ALT 19 05/16/2023 0750   ALKPHOS 51 05/16/2023 0750   BILITOT 0.3 05/16/2023 0750   GFRNONAA >60 11/20/2021 0703       Component Value Date/Time   WBC 6.9 11/20/2021 0703   RBC 4.72 11/20/2021 0703   HGB 15.4 (H) 11/20/2021 0703   HCT 46.3 (H) 11/20/2021 0703   PLT 156 11/20/2021 0703   MCV 98.1 11/20/2021 0703   MCH 32.6 11/20/2021 0703   MCHC 33.3 11/20/2021 0703   RDW 13.1 11/20/2021 0703   LYMPHSABS 1.0 11/20/2021 0703   MONOABS 0.4 11/20/2021 0703   EOSABS 0.1 11/20/2021 0703   BASOSABS 0.0 11/20/2021 0703    No results found for: "POCLITH", "LITHIUM"   No results found for: "PHENYTOIN", "PHENOBARB", "VALPROATE", "CBMZ"   .res Assessment: Plan:    Diem was seen today for follow-up, depression, anxiety and medication problem.  Diagnoses and all orders for this visit:  Major depressive disorder, recurrent episode, moderate (HCC) -     OLANZapine (ZYPREXA) 5 MG tablet; Take 1 tablet (5 mg total) by mouth at bedtime.  Social anxiety disorder -     OLANZapine (ZYPREXA) 5 MG tablet; Take 1 tablet (5 mg total) by mouth at bedtime.  Mixed obsessional thoughts and acts -     OLANZapine (ZYPREXA) 5 MG tablet; Take 1 tablet (5 mg total) by mouth at bedtime.  Attention deficit hyperactivity disorder (ADHD),  predominantly inattentive type -     methylphenidate 36 MG PO CR tablet; Take 2 tablets (72 mg total) by mouth daily. -     methylphenidate 36 MG PO CR tablet; Take 2 tablets (72 mg total) by mouth daily.   We discussed TRD and anxiety . she has a long list of failed psychiatric meds.  She has not tended to do well with higher dosages of SSRIs.  OCD handwashing & checking and dep under pretty good control. Overall she is satisfied with the current med regimen  Patient has been markedly depressed and anxious but, much better Since adding olanzapine 10 mg she has seen a significant improvement in depression and anxiety. Much better with clomipramine 75 HS  Chart review revealed that prior to starting treatment here patient had taken clomipramine in the past with some benefit but also with some side effects.  It is a tricyclic antidepressant distinctly different from SSRIs.  Some patients respond better to them.  She has a history of previous benefit.   We discussed the value of doing blood levels with tricyclic antidepressants like clomipramine.  She agrees.  Lab 02/20/21 clomipramine 50 + Luvox 100 = clomip 195 + norclomip 111= total 306 (normal 220-500) Better with Increase clomipramine to 75 mg HS 02/11/2022 clomipramine level 183 history people, nor clomipramine level 89, total 272 on clomipramine 75 mg nightly and fluvoxamine 50 mg twice  daily.  Disc dental risks and SE risks with meds.  HA managed  Could not tolerate fluvoxamine 100 BID or clomipramine 100 DT sleepiness Therefore continue fluvoxamine 50 mg twice daily and clomipramine 75 mg nightly ADD still better with Concerta 72 mg versus Focalin. Continue propranolol 40 AM Ok prn Xanax on Sunday afternoon 5-HTP supplement 200 mg daily  Consider reduction in olanzapine bc apparent EPS though it was markedly helpful. Reduce from 7.5 mg to 5 mg daily to try to get more range of expression and emotion.  Feels still too blunted but  better. . . She is continuing psychotherapy with Dr. Farrel Demark  Discussed potential metabolic side effects associated with atypical antipsychotics, as well as potential risk for movement side effects. Advised pt to contact office if movement side effects occur.   Mouth tremor appears unchanged since the switch from risperidone to olanzapine and no worse and maybe better.  We will continue to monitor.  Consider Auvelity for residual depression  When she retires plans to read and not work bc obsesses about the job.  Follow-up in 2  mos  Meredith Staggers MD, DFAPA  Future Appointments  Date Time Provider Department Center  02/25/2024  4:00 PM Olivia Mackie, NP [facility] None  04/09/2024 10:00 AM Robley Fries, PhD [facility] None  04/13/2024  8:00 AM [facility] None  08/13/2024  8:20 AM Th[place], [place], MD [facility] None    No orders of the defined types were placed in this encounter.      -------------------------------

## 2024-02-25 ENCOUNTER — Encounter: Payer: Self-pay | Admitting: Nurse Practitioner

## 2024-02-25 ENCOUNTER — Ambulatory Visit (INDEPENDENT_AMBULATORY_CARE_PROVIDER_SITE_OTHER): Payer: BC Managed Care – PPO | Admitting: Nurse Practitioner

## 2024-02-25 VITALS — BP 110/68 | HR 72 | Ht 63.5 in | Wt 121.0 lb

## 2024-02-25 DIAGNOSIS — Z780 Asymptomatic menopausal state: Secondary | ICD-10-CM | POA: Diagnosis not present

## 2024-02-25 DIAGNOSIS — Z1331 Encounter for screening for depression: Secondary | ICD-10-CM | POA: Diagnosis not present

## 2024-02-25 DIAGNOSIS — M810 Age-related osteoporosis without current pathological fracture: Secondary | ICD-10-CM | POA: Diagnosis not present

## 2024-02-25 DIAGNOSIS — R1030 Lower abdominal pain, unspecified: Secondary | ICD-10-CM | POA: Diagnosis not present

## 2024-02-25 DIAGNOSIS — Z01419 Encounter for gynecological examination (general) (routine) without abnormal findings: Secondary | ICD-10-CM

## 2024-02-25 NOTE — Progress Notes (Signed)
 Ashlee Chavez 1960-11-14 [id number]   History:  63 y.o. G0 presents for annual exam. Complains of lower abdominal pain that started a week ago. Describes as random, sharp, nothing make sit worse or better. No urinary symptoms, vaginal symptoms or GI symptoms. Has not had pain the last couple of days. Postmenopausal - no HRT, no bleeding. 2010 LGSIL. Hypothyroidism and osteoporosis managed by endocrinology. Stopped Actonel 10/2021, doing Reclast. OCD, GAD, MDD managed by psychiatry.   Gynecologic History Patient's last menstrual period was 11/19/2010.   Contraception/Family planning: post menopausal status Sexually active: No  Health Maintenance Last Pap: 02/24/2023. Results were: Normal Last mammogram: 2025. Results were: Normal per patient Last colonoscopy: 11/2022. Results were: Normal per patient Last Dexa: 03/13/2022. Results were: T-score -2.6     02/25/2024    3:50 PM  Depression screen PHQ 2/9  Decreased Interest 0  Down, Depressed, Hopeless 0  PHQ - 2 Score 0     Past medical history, past surgical history, family history and social history were all reviewed and documented in the EPIC chart. Boyfriend of 2 years. Manager at [employer].   ROS:  A ROS was performed and pertinent positives and negatives are included.  Exam:  Vitals:   02/25/24 1545  BP: 110/68  Pulse: 72  SpO2: 97%  Weight: 121 lb (54.9 kg)  Height: 5' 3.5" (1.613 m)      Body mass index is 21.1 kg/m.  General appearance:  Normal Thyroid:  Symmetrical, normal in size, without palpable masses or nodularity. Respiratory  Auscultation:  Clear without wheezing or rhonchi Cardiovascular  Auscultation:  Regular rate, without rubs, murmurs or gallops  Edema/varicosities:  Not grossly evident Abdominal  Soft,nontender, without masses, guarding or rebound.  Liver/spleen:  No organomegaly noted  Hernia:  None appreciated  Skin  Inspection:  Grossly normal   Breasts: Examined lying and  sitting.   Right: Without masses, retractions, discharge or axillary adenopathy.   Left: Without masses, retractions, discharge or axillary adenopathy. Pelvic: External genitalia:  no lesions              Urethra:  normal appearing urethra with no masses, tenderness or lesions              Bartholins and Skenes: normal                 Vagina: normal appearing vagina with normal color and discharge, no lesions. Atrophic changes              Cervix: no lesions Bimanual Exam:  Uterus:  no masses or tenderness              Adnexa: no mass, fullness, tenderness              Rectovaginal: Deferred              Anus:  normal, no lesions  Patient informed chaperone available to be present for breast and pelvic exam. Patient has requested no chaperone to be present. Patient has been advised what will be completed during breast and pelvic exam.   Assessment/Plan:  63 y.o. G0 for annual exam.   Well female exam with routine gynecological exam - Education provided on SBEs, importance of preventative screenings, current guidelines, high calcium diet, regular exercise, and multivitamin daily. Labs with PCP and endocrinology.   Postmenopausal - no HRT, no bleeding  Age-related osteoporosis without current pathological fracture - Plan: DG Bone Density. Managed by endocrinology. 03/2022 T-score -2.6. Stopped  Actonel 10/2021, doing Reclast.   Lower abdominal pain - no pain the last couple of days. Normal exam. Will monitor. If pain returns and does not appear to be GI-related we will schedule pelvic ultrasound.   Screening for cervical cancer - 2010 LGSIL. Will repeat at 3-year interval per guidelines.   Screening for breast cancer - UTD. Continue annual screenings.  Normal breast exam today.  Screening for colon cancer - 11/2022 colonoscopy. Will repeat at 10-year interval per GI recommendation.   Return in about 1 year (around 02/24/2025) for Annual.    Andee Bamberger DNP, 4:08 PM 02/25/2024

## 2024-03-14 ENCOUNTER — Other Ambulatory Visit: Payer: Self-pay | Admitting: Psychiatry

## 2024-03-14 DIAGNOSIS — F331 Major depressive disorder, recurrent, moderate: Secondary | ICD-10-CM

## 2024-03-14 DIAGNOSIS — F422 Mixed obsessional thoughts and acts: Secondary | ICD-10-CM

## 2024-03-14 DIAGNOSIS — F4010 Social phobia, unspecified: Secondary | ICD-10-CM

## 2024-03-23 ENCOUNTER — Other Ambulatory Visit: Payer: Self-pay

## 2024-03-23 DIAGNOSIS — F900 Attention-deficit hyperactivity disorder, predominantly inattentive type: Secondary | ICD-10-CM

## 2024-03-23 MED ORDER — METHYLPHENIDATE HCL ER (OSM) 36 MG PO TBCR: 72.0000 mg | EXTENDED_RELEASE_TABLET | Freq: Every day | ORAL | 0 refills | Status: DC

## 2024-04-09 ENCOUNTER — Ambulatory Visit: Admitting: Psychiatry

## 2024-04-13 ENCOUNTER — Other Ambulatory Visit: Payer: Self-pay | Admitting: Psychiatry

## 2024-04-13 ENCOUNTER — Other Ambulatory Visit

## 2024-04-13 DIAGNOSIS — E063 Autoimmune thyroiditis: Secondary | ICD-10-CM | POA: Diagnosis not present

## 2024-04-13 DIAGNOSIS — R251 Tremor, unspecified: Secondary | ICD-10-CM

## 2024-04-13 LAB — TSH: TSH: 1.06 m[IU]/L (ref 0.40–4.50)

## 2024-04-13 LAB — T4, FREE: Free T4: 1.2 ng/dL (ref 0.8–1.8)

## 2024-04-14 ENCOUNTER — Ambulatory Visit: Payer: Self-pay | Admitting: Endocrinology

## 2024-04-21 ENCOUNTER — Ambulatory Visit (HOSPITAL_BASED_OUTPATIENT_CLINIC_OR_DEPARTMENT_OTHER)
Admission: RE | Admit: 2024-04-21 | Discharge: 2024-04-21 | Disposition: A | Discharge status: Home / Self Care | Payer: Self-pay | Source: Ambulatory Visit | Attending: Nurse Practitioner | Admitting: Nurse Practitioner

## 2024-04-21 DIAGNOSIS — Z780 Asymptomatic menopausal state: Secondary | ICD-10-CM | POA: Diagnosis not present

## 2024-04-21 DIAGNOSIS — M810 Age-related osteoporosis without current pathological fracture: Secondary | ICD-10-CM | POA: Diagnosis not present

## 2024-04-27 ENCOUNTER — Other Ambulatory Visit: Payer: Self-pay | Admitting: Psychiatry

## 2024-04-27 DIAGNOSIS — F422 Mixed obsessional thoughts and acts: Secondary | ICD-10-CM

## 2024-04-27 DIAGNOSIS — F4010 Social phobia, unspecified: Secondary | ICD-10-CM

## 2024-04-28 ENCOUNTER — Ambulatory Visit: Payer: Self-pay | Admitting: Nurse Practitioner

## 2024-04-29 ENCOUNTER — Ambulatory Visit (INDEPENDENT_AMBULATORY_CARE_PROVIDER_SITE_OTHER): Payer: Self-pay | Admitting: Psychiatry

## 2024-04-29 ENCOUNTER — Ambulatory Visit: Admitting: Psychiatry

## 2024-04-29 DIAGNOSIS — F330 Major depressive disorder, recurrent, mild: Secondary | ICD-10-CM

## 2024-04-29 DIAGNOSIS — F4010 Social phobia, unspecified: Secondary | ICD-10-CM

## 2024-04-29 DIAGNOSIS — F422 Mixed obsessional thoughts and acts: Secondary | ICD-10-CM

## 2024-04-29 NOTE — Progress Notes (Signed)
 Psychotherapy Progress Note [facility] [name], PhD LP  Patient ID: Ashlee Chavez)    MRN: [id number] Therapy format: Individual psychotherapy Date: 04/29/2024      Start: 9:12a     Stop: 10:00a     Time Spent: 48 min Location: In-person   Session narrative (presenting needs, interim history, self-report of stressors and symptoms, applications of prior therapy, status changes, and interventions made in session) Day off for Ashlee Chavez, but also decided to retire July 31.  Reassured by financial advisors she has enough and how it will work.  Got married to Ashlee Chavez, now living here full time, finding they have a truly flexible togetherness, and she is able to have her privacy when desired.  Plan is to sell both homes and resettle together anew.    Re work, has been bored most of the time since new ownership arrived and removed duties, though she has been offered new things to come soon.  Clear that it's amicable, no mistrust involved, and full respect.    Amazed to not be more anxious about all this, but reassured it's normal to feel uneasy with normal happening when you're not used to it.  Social anxiety still pushy -- could not abide the spontaneous idea of meeting Ashlee Chavez's neighbors, for one.  (I'm hideous.)  Did attend a church group dinner thrown for the newlyweds, and she is ready to attend another gathering of 2 friends of his soon.  Encouraged in social exposure and finding the enjoyment in it.  Discussed food -- feels guilty for not cooking, particularly.  Confirmed she would like to eat better in a way that helps her brain stay healthier, provided MIND Diet reference.  Re FOO, good to graduate from sharing a phone plan with Ashlee Chavez and having him do her taxes.  Keeping in touch with Chavez and Ashlee Chavez, all seems relatively normal now compared to distress years ago.  In August plans to visit her extended family and Ashlee Chavez's in [place] and [place].  Affirmed and  encouraged.  Forecast her growing edges -- continue to allow some vulnerability and being seen by Ashlee Chavez, get accustomed to the good she had now, try to allow for social occasions without indulging negative self-views.  Therapeutic modalities: Cognitive Behavioral Therapy, Solution-Oriented/Positive Psychology, and Ego-Supportive  Mental Status/Observations:  Appearance:   Casual     Behavior:  Appropriate  Motor:  Normal  Speech/Language:   Clear and Coherent  Affect:  Appropriate  Mood:  anxious  Thought process:  normal  Thought content:    Obsessions and at times  Sensory/Perceptual disturbances:    WNL  Orientation:  Fully oriented  Attention:  Good    Concentration:  Good  Memory:  WNL  Insight:    Variable  Judgment:   Good  Impulse Control:  Good   Risk Assessment: Danger to Self: No Self-injurious Behavior: No Danger to Others: No Physical Aggression / Violence: No Duty to Warn: No Access to Firearms a concern: No  Assessment of progress:  progressing well  Diagnosis:   ICD-10-CM   1. 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 Social anxiety disorder  F40.10     3. Mixed obsessional thoughts and acts  F42.2      Plan:  Adjustment pressures -- Note and value the relief inherent in leaving a changed workplace.  Embrace retirement living with plan or short menu of things to do with suddenly unstructured time as  well as things worth getting out of the house for when a job is not one of them.  Endorse plans to rejoin gym, read copiously, and walk.  For new marriage, embrace the change as enlarging, not diminishing her life and autonomy, and continue to practice open communication. For social anxiety and OCD attached to relationship -- Experiment further in letting [name] do things at the house, accompany her to things, and repeat and progress in letting him see her feet and other aspects of her body which may be ego dystonic to her.  Practice letting it be  strange but true that she commends the attention and interest of a kind and suitable man.  Practice letting it not have to be the same feeling she had with Ashlee Chavez to be legitimate love.  Self-affirm that being capable of living by herself is an asset, not cause for doubt, and that strong, adolescent feelings of being in love are both more difficult at her age and suppressed by a range of things; her relationship with Ashlee Chavez has all the behavioral  hallmarks of being very strong and good, and the premarital inventory supports that.  To break down too good to be true feelings, road-test conflict -- broach the subject of dealbreakers and strong preferences, solicit what Ashlee Chavez's would be, and work up assertive requests where needed (e.g., less talking during TV/movie).  The rest is about accommodating shared space and control after being so used to living alone and longsuffering; challenge to allow herself to dress down, and to take some amount of alone time while together, and to talk about it together, as practice for healthy transition to married life.  Practice noticing intrusive thoughts and asking herself how much she'd actually bet they're true if she put money on it. Work OCD -- Although worries about making mistakes on the job are much lower now, look for opportunities to challenge her worrying mind by coming up with mistakes that she can either imagine or fake, and in any case look for a way to laugh about her own mind and intrusive worries.  For any feeling underutilized, recommend asking boss -- at interest -- for additional duties in slow weeks to make job more fulfilling and ego-syntonic.   CBT/SFT tactics for worry/anxiety/OCD -- Conceive of worrying voice as Ashlee Chavez, her inner critic who is less of an enemy than an overworried friend.  Continue to seek serendipitous exposure opportunities to engage or let herself show and notice how it works out. Physiological self-care -- Maintain vitamin  supplements, include omega-3 for anti-inflammatory benefits and brain health.  Work with timing on medication adjustments to maximize rest at night and alertness by day.  Lip tremor and psychomotor retardation, along with reported fatigue, could signal nutritional deficiencies, e.g., B1, B6, B12, D, E, or a medication-related issue -- work with psychiatry as needed. Longterm bereavement -- seek to notice and accept all grief feelings when they come, no judgment Other recommendations/advice -- As may be noted above.  Continue to utilize previously learned skills ad lib. Medication compliance -- Maintain medication as prescribed and work faithfully with relevant prescriber(s) if any changes are desired or seem indicated. Crisis service -- Aware of call list and work-in appts.  Call the clinic on-call service, 988/hotline, 911, or present to [facility] or ER if any life-threatening psychiatric crisis. Followup -- Return for time at discretion.  Next scheduled visit with me 05/11/2024.  Next scheduled in this office 05/11/2024.  Lamar Kendall, PhD [name], PhD  LP Clinical Psychologist, [facility] [address], [address] [place] [address], [place] [address] [phone]

## 2024-05-11 ENCOUNTER — Ambulatory Visit: Admitting: Psychiatry

## 2024-05-13 ENCOUNTER — Ambulatory Visit (INDEPENDENT_AMBULATORY_CARE_PROVIDER_SITE_OTHER): Payer: Self-pay | Admitting: Psychiatry

## 2024-05-13 ENCOUNTER — Encounter: Payer: Self-pay | Admitting: Psychiatry

## 2024-05-13 DIAGNOSIS — G43009 Migraine without aura, not intractable, without status migrainosus: Secondary | ICD-10-CM

## 2024-05-13 DIAGNOSIS — F331 Major depressive disorder, recurrent, moderate: Secondary | ICD-10-CM

## 2024-05-13 DIAGNOSIS — F900 Attention-deficit hyperactivity disorder, predominantly inattentive type: Secondary | ICD-10-CM | POA: Diagnosis not present

## 2024-05-13 DIAGNOSIS — F4010 Social phobia, unspecified: Secondary | ICD-10-CM | POA: Diagnosis not present

## 2024-05-13 DIAGNOSIS — F422 Mixed obsessional thoughts and acts: Secondary | ICD-10-CM | POA: Diagnosis not present

## 2024-05-13 DIAGNOSIS — F5102 Adjustment insomnia: Secondary | ICD-10-CM

## 2024-05-13 DIAGNOSIS — R251 Tremor, unspecified: Secondary | ICD-10-CM

## 2024-05-13 MED ORDER — TOPIRAMATE 100 MG PO TABS: 100.0000 mg | ORAL_TABLET | Freq: Two times a day (BID) | ORAL | 1 refills | Status: DC

## 2024-05-13 MED ORDER — OLANZAPINE 5 MG PO TABS: 2.5000 mg | ORAL_TABLET | Freq: Every day | ORAL | Status: DC

## 2024-05-13 NOTE — Progress Notes (Signed)
 Ashlee Chavez [id number] 1961/03/18 63 y.o.  Subjective:   Patient ID:  Ashlee Chavez is a 63 y.o. (DOB Mar 10, 1961) female.  Chief Complaint:  Chief Complaint  Patient presents with   Follow-up   Depression   Anxiety    Ashlee Chavez presents today for follow-up of recently worse depression DT relationship loss.    When seen February 10, 2019.  She had received some benefit from Ritalin  20 mg 3 times daily but was having issues with the crash.  We switched to Concerta  72 mg every morning.  September 14, 2019.  She was having persistent bothersome OCD and was interested in further medicine changes to try to help with these symptoms because she has aggressively pursued therapy and still has significant symptoms.  She has been on multiple SSRIs and so we initiated augmentation with risperidone  half milligram daily to increase to 1 mg daily. Sig improvement with risperidone .  SE low appetite with it.  Not hungry for dinner. Sleep so much better also.  Improvement is marked.  Think better bc less obsessing time.  Not constant now.  Noticed benefit within the first week.  Less obsessing lessened depression.  But residual symptoms.  visit October 14, 2019.  She was encouraged to experiment with the dosing of the risperidone  up to 2 mg or 3 mg nightly to see if she could get additional reduction in her chronic anxiety and OCD.   seen December 15, 2019.  Settled at 2 mg risperidone  bc increased sleep.  Sleep is so much better.  Harder to get up in the morning.   Found out insurance won't cover Concerta  anymore.  Not as good on Ritalin  TID.  Not as even a response.  Terrible insurance. Therefore the only med change with switching to Focalin  to see if it would be covered better.  Appt 03/09/20, switch to Concerta  72 mg is better with better mood and motivation vs Focalin .  Focus is also better.  Very poor function with Focalin  and better with Concerta .   Better overall with more even response to  stimulant and better appetite.  No SE.  Duration all day.  Less desperate hopeless feelings on the stimulant.  Stressful times. With increase risperidone  a little better mood bump.  SE a little shakiness noticed in jaw.   Not unusual levels of anxiety.  Lost her best friend 1 year ago and still grieving.  Not much family. Alone.  Not close to B and S who live elsewhere.  Talks to God daily.  Gets to work daily.  Function is good there.  Reads and calls people.  OCD no worse with Covid. Did join a Bible study which was a huge step. Plan start lamotrigine    04/04/20 appt with the following noted: Appt moved up.  She thought she was supposed to stop risperidone  when started lamotrigine .  Started having SI off the risperidone  but wasn't sure if it was SE.  No risperidone  3 mg for 3 weeks.Anxiety shaking sweating crying all started right away.  Also trouble with sleep and can't eat.  Plan:Restart risperidone  1 mg now and another milligram in 2 to 3 hours if not feeling sufficient relief to get the full dose of 3 mg today.  Then resume 3 mg risperidone  each evening. Restart lamotrigine  25 mg daily for 2 weeks and follow the previous instructions on increasing it.  05/11/2020 appointment the following is noted: Marked improvement back on risperidone  3 mg.  SE shakes jaw.  Doesn't affect typing. Started lamotrigine  and up to 100 mg for 2 weeks.  Feels it's helped depression. Better mood and able to laugh.  Able to get pizza for Bible study and not cry. Apptetite is not great.  Working on it. Anxiety is better not gone. Plan: no med changes  07/26/20 appt with the following noted; Increased lamotrigine  gradually to 200 mg daily over the phone 6 weeks ago.  No improvement really for depression. With it. CC depression.   Risperidone  still helping anxiety. Plan: Increase lamotrigine  to 300 mg daily..  09/14/2020 appointment with following noted: Started taper of lamotrigine  DT NR. Started latuda  20 mg  daily for 10 days.  Tolerated fine.  No effects so far.. Main problem is still depression.  Anxiety is not too bad.  No energy, motivation, sleeeping a lot.  No sig SI. In past started risperidone  for anxiety and depression.  Increase Latuda  to 40 mg daily. Disc potential DDI with risperidone  reducing effectiveness of  Latuda  so reduc risperidone  to 1.5 mg HS  10/09/2020 phone call from patient: Patient left a voicemail on Friday while we were out of office for the Holiday that she is not any better. She feels the cutting in the 1/2 of Risperidone  has really increased her anxiety. She was instructed to call back if not any better but concerned that taking away the Risperidone  will really exacerbate her anxiety.  MD response:  At the last appointment we increased Latuda  to 40 mg daily and reduced risperidone  to half tablet daily.  The goal was to use Latuda  and eliminate risperidone .  However since her anxiety has worsened with the reduction in risperidone , I agree with her and she should take the full dose of risperidone  as well as continue the Latuda  40 mg daily.  The Latuda  is intended to help depression.  11/23/2020 appointment with the following noted: Propranolol  only once daily. No better with higher Latuda .  No SE either. No change in appetite which is poor.  Still struggling with depression and OCD is a little worse with changes at work. Off lamotrigine . Plan: 5-HTP supplement 200 mg daily  Increase Latuda  to 60 mg  for 3 weeks, If no benefit and no SE increase to 80 mg daily with food.  ADD still better with Concerta  72 mg versus Focalin . Continue high dosage paroxetine  80 mg daily.  01/16/2021 appointment with following noted: Increase Latuda  to 80 mg without benefit or SE.  Anxiety and depression with poor STM.  Food getting caught in her throat.  No change in eating.  No change in weight.   Anxious thoughts mostly about work even when not there.  Anxious about work needed on the house  she can't afford.  Enjoys reading but feels guilty that she should be doing something else.  More general anxiety than OCD. 5-HTP supplement 200 mg daily Wean Latuda  over a week DT NR Reduce mirtazapine  to 30 mg HS  ADD still better with Concerta  72 mg versus Focalin . Start clomipramine  25 mg capsules 1 at night for 1 week, Then increase clomipramine  to 2 capsules at night and reduce paroxetine  to 1-1/2 tablets or 60 mg daily. Wait 1 week and then get the blood test   03/15/2021 appt noted: OK not great.  About the same.  Not much change in the last 8 weeks. SE none Lab 02/20/21 clomipramine  50 + Luvox  100 = clomip 195 + norclomip 111= total 306 Losing weight and no appetite and hard to eat. Weight  loss to 107#. A lot of anxiety and depression   Sleep less sound with less mirtazapine .  Plan: Due to the severity of symptoms,Add olanzapine  5 mg tablet 1 at night Stop mirtazapine  Reduce risperidone  to 1/2 tablet for 1 week then stop it.  Call if SI recurs.  03/30/2021 appointment with the following noted: It makes me a little tired but so far anxiety is improved.  No problems off risperidone .  Appetite is not better but did get protein bars and is eating.   Less panic.  But still occurs.  Things just started this week to be a little better.  Not huge but it's a start.  A little more energy. Sleep 10 hours but that's unchanged with olanzapine . First time I've had hope in a long time. Plan: A little better with olanzapine  5 mg HS vs risperidone  3 mg. Increase olanzapine  to 10 mg HS  Call in 2 weeks if you are not seeing progressive improvement  04/12/2021 appointment with the following noted: Better with anxiety.  Sleep better.  Daytime could nap if not busy.  Otherwise OK.  Not much change in appetite.  Up to 110# about 2.5# better and trying to get protein twice daily.   A little change in OCD.   Depression is better some and staying up later and doing more than she was.  Doing some things  after dinner which is huge.  Big change with depression.  Not retreating to bed as quickly. Plan: continue meds.  05/18/21 appt noted: Further improvement in anxiety.  I feel better but still not going out but not excessively sleeping.  Acting more normally, eating better.  Eating dinner again.  Sleep reduced to 8 hours. SE dry mouth and a little hungrier. Asks about going up on olanzapine  to further reduce anxiety. [name] and describes better from 10/10 to 4/10 for anxiety and depression down to 3/10.SABRA  Anxiety symptoms include: Excessive Worry, Obsessive Compulsive Symptoms:   Handwashing,,.  Pt reports no sleep issues. Pt reports that appetite is good. Pt reports that energy is good and down slightly. Concentration is no change. Suicidal thoughts:  denied by patient. Plan increase olanzapine  to 15 mg HS DT partial benefit  06/22/21 appt noted: Too tired and sleepy with 15 mg olanzapine  and reduced the dose to 10 mg daily. No other med changes. Overall about the same.  Don't do anything different.  Happy that it's better than it was stays up to 930 until 6 PM.  Don't go out except Bible study and church once weekly. Goes to grocery early to avoid people. Anxiety lately worse than the depression. Dx pulsatile tinnitus Plan: cut olanzapine  back to 10 mg HS Increase clomipramine  to 75 mg HS  08/13/2021 appointment with the following noted: Anxiety is good after increase clomipramine .  Used to be desperate to get home and not now.  Was thinking of disability and now it's better.  I texted a guy on a dating site first time in 2 years.  It's a big deal.  Started exercising a little.   SE no worse with increase clomipramine . Depression is better too and manageable with brief downturns.   Sleep is pretty good. Plan no med changes  10/30/2021 appt noted: Pretty good and a little better than last time.  Still getting out a little with a guy she met on dating  site and it's going ok.  Still prefers to be home.  Still get  anxious going out. Overall best response so far with any med for anxiety.   Depression is manageable. SE is OK Staying up more appropriately late and sleep is OK. Plan : Much better with clomipramine  75 HS Also continue olanzapine  10 HS & fluvoxamine  50 Benefit Concerta  72 mg a.m. Taking propranolol  20 BID for tremor No med changes  01/29/2022 appointment with the following noted: Pretty good and probably doing better. Handled triggers with less obsessions.  Had to have bx breast today and hasn't been obsessing like she would ususally.  Better than she ever expected. Surprising. Feels the med has hit it's stride at this point. No sig caffeine.  Tremor managed. Depresion managed. Concerta  is fine without problems.  Not noticeable when it wears off. Sleep good lately. Yes increase fluvoxamine  to 100 mg HS and 50 mg AM Repeat clomipramine  level .    02/11/22 lab noted: She is now on fluvoxamine  150 mg daily and clomipramine  75 mg daily.  It is known that fluvoxamine  can affect the blood level of clomipramine  but that is not a problem in her case.  We increased the fluvoxamine  at her last appointment on January 29, 2022 to the current dosage.  Let her know there is room to increase either fluvoxamine  to 200 mg daily which can be split between morning and evening if desired or taken all at night if desired.  Or the other alternative if she prefers is to increase clomipramine  to 100 mg at night.  Either 1 of those options may help further decrease anxiety and obsessions.  However if she would prefer to keep the dosage the same that is okay as well. Lorene Macintosh, MD, DFAPA  04/30/22 appt noted: Tried increasing clomipramine  and then fluvoxamine  and it made her too sleepy. Depression managed. Still a little OCD and anxiety but pretty much managed unless triggers. Getting out socially and dating which causes anxiety but doing OK. Tolerating  meds ok now. Sleep 9-5.  Likes to nap but not usually. No SI in a long time. Plan: Could not tolerate fluvoxamine  100 or clomipramine  100 DT sleepiness Therefore continue fluvoxamine  50 mg twice daily and clomipramine  75 mg nightly Continue olanzapine  10 mg nightly ADD still better with Concerta  72 mg versus Focalin . 5-HTP supplement 200 mg daily No med changes today . . 10/01/2022 appointment noted: Continues same med.  Insurance won't pay for more than 1 tablet daily fluvoxamine . SE some tiredness but sleeps well. Manageable depression .  Anxiety better with meds but not gone.   Concerta  still helping. Plan: Therefore continue fluvoxamine  50 mg twice daily and clomipramine  75 mg nightly Continue olanzapine  10 mg nightly ADD still better with Concerta  72 mg versus Focalin . Propranolol  40 AM Topomax 100 BID 5-HTP supplement 200 mg daily No med changes today  04/02/23 appt noted: Meds as above, takes Xanax  0.25 mg on Sun afternoons bc anxious about work the next day.  Helps. Still happiest at home on couch but forces herself to get out and it usually works out fine.   Dep is usually ok and not like it was before at all. Concerta  ok for attention. SE tremor and nothing else.  Plan no changes  12/15/23 appt noted: Psych meds: Alprazolam  0.25 mg daily as needed, clomipramine  75 nightly, fluvoxamine  100 mg tablet, one half twice daily; Concerta  72 AM, olanzapine  10 nightly, propranolol  20 mg tablets 1-2 twice daily as needed tremor, topiramate  100 twice daily for headache, Imitrex as needed migraine 100 mg. Ok.  Very even keel.  Don't cry even when might feel like it.  Still worry about the same.  Not worse.  Fear of mistakes at work.  New owners of business.   Dep about 5/10 is about the same.  Not as interested in enjoyable activity as normal.   Might retire in June.   HA under control. Plan: reduc olanzapine  to 7.5 mg HS.  02/17/24 appt noted: Med: reduced olanzapine  5 mg HS. Others  same. Notices a little more emotional variability but still blunted more than she would like.  Nothing got worse. Will find out in May if they are going to retire her or not.   Anxiety is not sig worse.    05/13/24 appt noted:  Med: reduced olanzapine  5 mg HS. Others same. Pretty much the same.  Emo blunted.  No excitement.  Still depressed.  It's not awful.  Used to have a great sense of humor and I miss that.  She thinks it is on of the med blunting her.   Fatigue.  Sleep a lot.  No SI. Anxiety has been ok and OCD is ok.   rETIRES END OF THIS MONTH.  She has plans to stay busy.  Just got married and will get a new house.  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  Will get in Bible Study and book club and gym.  Numerous failed psychiatric medication trials include Lexapro 60 mg a day, Zoloft 250, Paxil  with weight gain,  Fluvoxamine  100 Clomipramine  50,  (Could not tolerate fluvoxamine  100 or clomipramine  100 DT sleepiness Therefore continue fluvoxamine  50 mg twice daily and clomipramine  75 mg nightly)  Wellbutrin 300 with side effects,  mirtazapine  Rexulti which helped initially,  Vraylar, Abilify between 1 and 2.5 mg and up to 15 mg with no response,  Latuda  80 failed Risperidone  Olanzapine  15 sedation. Lamotrigine  300 NR buspirone 30 mg,  Cytomel,  lithium 625,  pramipexole with insomnia,   Lunesta with crying spells,  topiramate , Deplin, propranolol ,   Ritalin , Focalin  NR, Concerta  72 helped No history of Prozac, Viibryd.  Review of Systems:  Review of Systems  Constitutional:  Negative for appetite change, diaphoresis and unexpected weight change.  HENT:  Negative for tinnitus.   Cardiovascular:  Negative for palpitations.  Neurological:  Positive for tremors and headaches. Negative for dizziness and light-headedness.       Mouth  Psychiatric/Behavioral:  Negative for agitation, behavioral problems, confusion, decreased concentration, hallucinations, self-injury, sleep  disturbance and suicidal ideas. The patient is nervous/anxious. The patient is not hyperactive.     Medications: I have reviewed the patient's current medications.  Current Outpatient Medications  Medication Sig Dispense Refill   ALPRAZolam  (XANAX ) 0.25 MG tablet Take 1 tablet (0.25 mg total) by mouth daily as needed for anxiety. 30 tablet 0   Ascorbic Acid (VITAMIN C) 500 MG CHEW 1 tablet Orally Once a day     atorvastatin  (LIPITOR) 20 MG tablet Take 1 tablet (20 mg total) by mouth daily. 90 tablet 3   Cholecalciferol (VITAMIN D3) 25 MCG (1000 UT) CAPS 1 capsule Orally Once a day for 30 day(s)     clomiPRAMINE  (ANAFRANIL ) 75 MG capsule TAKE 1 CAPSULE BY MOUTH AT BEDTIME. 90 capsule 0   levothyroxine  (SYNTHROID ) 75 MCG tablet Take 1 tablet (75 mcg total) by mouth daily. 90 tablet 3   methylphenidate  36 MG PO CR tablet Take 2 tablets (72 mg total) by mouth daily. 60 tablet 0   propranolol  (INDERAL ) 20  MG tablet TAKE 1-2 TABLETS BY MOUTH TWICE A DAY AS NEEDED FOR TREMORS 360 tablet 0   SUMAtriptan (IMITREX) 100 MG tablet Take 100 mg by mouth daily as needed for migraine.     benzoyl peroxide 5 % gel 1 application Externally Once a day for 14 days     calcium  carbonate (SUPER CALCIUM ) 1500 (600 Ca) MG TABS tablet 1 tablet with meals Orally Twice a day for 30 day(s)     Calcium  Carbonate-Vitamin D  (OSCAL 500/200 D-3 PO) Take by mouth.     Cholecalciferol (VITAMIN D  PO) Take by mouth.     fluvoxaMINE  (LUVOX ) 100 MG tablet TAKE 0.5 TABLETS BY MOUTH 2 TIMES DAILY. (Patient not taking: Reported on 05/13/2024) 90 tablet 0   folic acid (FOLVITE) 1 MG tablet 1 tablet Orally Once a day for 30 day(s)     methylphenidate  36 MG PO CR tablet Take 2 tablets (72 mg total) by mouth daily. 60 tablet 0   OLANZapine  (ZYPREXA ) 5 MG tablet Take 0.5 tablets (2.5 mg total) by mouth at bedtime.     topiramate  (TOPAMAX ) 100 MG tablet Take 1 tablet (100 mg total) by mouth 2 (two) times daily. 180 tablet 1   No current  facility-administered medications for this visit.    Medication Side Effects:  Watch jaw tremor  Allergies:  Allergies  Allergen Reactions   Ampicillin Rash    Past Medical History:  Diagnosis Date   Gum lesion    Biopsy done-benign   Hypercholesteremia    Hypothyroid    LGSIL (low grade squamous intraepithelial dysplasia) 2010   Migraines    OCD (obsessive compulsive disorder)    Osteoporosis 01/2016   T score -2.7    Family History  Problem Relation Age of Onset   Hypertension Mother    Heart disease Mother    Hypertension Father    Breast cancer Paternal Aunt        Age 85   Diabetes Neg Hx     Social History   Socioeconomic History   Marital status: Single    Spouse name: Not on file   Number of children: Not on file   Years of education: Not on file   Highest education level: Not on file  Occupational History   Not on file  Tobacco Use   Smoking status: Never   Smokeless tobacco: Never  Vaping Use   Vaping status: Never Used  Substance and Sexual Activity   Alcohol use: Yes    Alcohol/week: 0.0 standard drinks of alcohol    Comment: wine occassionally   Drug use: No   Sexual activity: Not Currently    Birth control/protection: Post-menopausal    Comment: 1st intercourse 24 yo-5 partners  Other Topics Concern   Not on file  Social History Narrative   Not on file   Social Drivers of Health   Financial Resource Strain: Not on file  Food Insecurity: Not on file  Transportation Needs: Not on file  Physical Activity: Not on file  Stress: Not on file  Social Connections: Not on file  Intimate Partner Violence: Not on file    Past Medical History, Surgical history, Social history, and Family history were reviewed and updated as appropriate.   Please see review of systems for further details on the patient's review from today.   Objective:   Physical Exam:  LMP 11/19/2010   Physical Exam Constitutional:      General: She is not in acute  distress.  Appearance: Normal appearance. She is well-developed.  Musculoskeletal:        General: No deformity.  Neurological:     Mental Status: She is alert and oriented to person, place, and time.     Motor: Tremor present.     Coordination: Coordination normal.     Comments: Mild mouth tremor and diminished blink rate may be more prominent.  Psychiatric:        Attention and Perception: She is attentive. She does not perceive auditory hallucinations.        Mood and Affect: Mood is anxious and depressed. Affect is blunt. Affect is not labile, angry or inappropriate.        Speech: Speech is not delayed.        Behavior: Behavior is not slowed.        Thought Content: Thought content is not paranoid or delusional. Thought content does not include homicidal or suicidal ideation. Thought content does not include suicidal plan.        Cognition and Memory: Cognition normal.        Judgment: Judgment normal.     Comments: Insight intact. No auditory or visual hallucinations. No delusions. Less checking. Gradually improving OCD Severe sx fimproved with both anxiety and depression.  But still dep 5/10.  More blunted than she would like     Lab Review:     Component Value Date/Time   NA 143 02/05/2024 0805   K 4.5 02/05/2024 0805   CL 110 02/05/2024 0805   CO2 26 02/05/2024 0805   GLUCOSE 96 02/05/2024 0805   BUN 16 02/05/2024 0805   CREATININE 0.87 02/05/2024 0805   CALCIUM  9.6 02/05/2024 0805   PROT 7.0 05/16/2023 0750   ALBUMIN 4.4 05/16/2023 0750   AST 18 05/16/2023 0750   ALT 19 05/16/2023 0750   ALKPHOS 51 05/16/2023 0750   BILITOT 0.3 05/16/2023 0750   GFRNONAA >60 11/20/2021 0703       Component Value Date/Time   WBC 6.9 11/20/2021 0703   RBC 4.72 11/20/2021 0703   HGB 15.4 (H) 11/20/2021 0703   HCT 46.3 (H) 11/20/2021 0703   PLT 156 11/20/2021 0703   MCV 98.1 11/20/2021 0703   MCH 32.6 11/20/2021 0703   MCHC 33.3 11/20/2021 0703   RDW 13.1 11/20/2021 0703    LYMPHSABS 1.0 11/20/2021 0703   MONOABS 0.4 11/20/2021 0703   EOSABS 0.1 11/20/2021 0703   BASOSABS 0.0 11/20/2021 0703    No results found for: POCLITH, LITHIUM   No results found for: PHENYTOIN, PHENOBARB, VALPROATE, CBMZ   .res Assessment: Plan:    Edna was seen today for follow-up, depression and anxiety.  Diagnoses and all orders for this visit:  Major depressive disorder, recurrent episode, moderate (HCC) -     OLANZapine  (ZYPREXA ) 5 MG tablet; Take 0.5 tablets (2.5 mg total) by mouth at bedtime.  Mixed obsessional thoughts and acts -     OLANZapine  (ZYPREXA ) 5 MG tablet; Take 0.5 tablets (2.5 mg total) by mouth at bedtime.  Social anxiety disorder -     OLANZapine  (ZYPREXA ) 5 MG tablet; Take 0.5 tablets (2.5 mg total) by mouth at bedtime.  Attention deficit hyperactivity disorder (ADHD), predominantly inattentive type  Insomnia due to psychological stress  Migraine without aura and without status migrainosus, not intractable -     topiramate  (TOPAMAX ) 100 MG tablet; Take 1 tablet (100 mg total) by mouth 2 (two) times daily.  Tremor of face and hands  We discussed TRD and anxiety . she has a long list of failed psychiatric meds.  She has not tended to do well with higher dosages of SSRIs.  OCD handwashing & checking and dep under pretty good control. Overall she is satisfied with the current med regimen  Patient has been markedly depressed and anxious but, much better Since adding olanzapine  10 mg she has seen a significant improvement in depression and anxiety. Much better with clomipramine  75 HS  Chart review revealed that prior to starting treatment here patient had taken clomipramine  in the past with some benefit but also with some side effects.  It is a tricyclic antidepressant distinctly different from SSRIs.  Some patients respond better to them.  She has a history of previous benefit.   We discussed the value of doing blood levels with  tricyclic antidepressants like clomipramine .  She agrees.  Lab 02/20/21 clomipramine  50 + Luvox  100 = clomip 195 + norclomip 111= total 306 (normal 220-500) Better with Increase clomipramine  to 75 mg HS 02/11/2022 clomipramine  level 183 history people, nor clomipramine  level 89, total 272 on clomipramine  75 mg nightly and fluvoxamine  50 mg twice daily.  Disc dental risks and SE risks with meds.  HA managed  Could not tolerate fluvoxamine  100 BID or clomipramine  100 DT sleepiness Therefore continue fluvoxamine  50 mg twice daily and clomipramine  75 mg nightly ADD still better with Concerta  72 mg versus Focalin . Continue propranolol  40 AM Ok prn Xanax  on Sunday afternoon 5-HTP supplement 200 mg daily  Plan reduction in olanzapine  to 2.5 mg daily for 4 weeks, then sto pit  to try to get more range of expression and emotion and better energy.  Feels still too blunted and dep but hard to be sure which is predominant. . . She is continuing psychotherapy with Dr. Marijean  Discussed potential metabolic side effects associated with atypical antipsychotics, as well as potential risk for movement side effects. Advised pt to contact office if movement side effects occur.   Consider Auvelity for residual depression  When she retires plans to read and not work bc obsesses about the job.  Follow-up in 2  mos  Lorene Macintosh MD, DFAPA  Future Appointments  Date Time Provider Department Center  06/11/2024  9:00 AM Marijean [place], PhD [facility] None  07/05/2024 10:00 AM Cottle, Lorene KANDICE Raddle., MD [facility] None  08/13/2024  8:20 AM Thapa, [place], MD [facility] None  02/28/2025  4:00 PM Prentiss Riggs A, NP [facility] None    No orders of the defined types were placed in this encounter.      -------------------------------

## 2024-05-15 ENCOUNTER — Other Ambulatory Visit: Payer: Self-pay | Admitting: Psychiatry

## 2024-05-15 DIAGNOSIS — F4010 Social phobia, unspecified: Secondary | ICD-10-CM

## 2024-05-15 DIAGNOSIS — F422 Mixed obsessional thoughts and acts: Secondary | ICD-10-CM

## 2024-05-15 DIAGNOSIS — F331 Major depressive disorder, recurrent, moderate: Secondary | ICD-10-CM

## 2024-06-11 ENCOUNTER — Ambulatory Visit (INDEPENDENT_AMBULATORY_CARE_PROVIDER_SITE_OTHER): Admitting: Psychiatry

## 2024-06-11 DIAGNOSIS — F3340 Major depressive disorder, recurrent, in remission, unspecified: Secondary | ICD-10-CM | POA: Diagnosis not present

## 2024-06-11 DIAGNOSIS — F422 Mixed obsessional thoughts and acts: Secondary | ICD-10-CM | POA: Diagnosis not present

## 2024-06-11 DIAGNOSIS — F4010 Social phobia, unspecified: Secondary | ICD-10-CM | POA: Diagnosis not present

## 2024-06-11 NOTE — Progress Notes (Signed)
 Psychotherapy Progress Note [facility] Ashlee Kendall, PhD LP  Patient ID: Ashlee Chavez)    MRN: [id number] Therapy format: Individual psychotherapy Date: 06/11/2024      Start: 9:05a     Stop: 9:55a     Time Spent: 50 min Location: In-person   Session narrative (presenting needs, interim history, self-report of stressors and symptoms, applications of prior therapy, status changes, and interventions made in session) Freshly retired, today 1st day of freedom.  Had a lovely farewell lunch at work, not too much or too little.  Knows there will be details that come up, things to ask her as the knowledgeable veteran, and clear from her vantage point that new management is behind the action.  Just dealt at 3pm yesterday with a late-breaking request to hand off information. Actually, been bored for a month for being prevented from doing meaningful things, got kind of aversive to be on her phone entertaining herself, her lack of duty at odds with her strict sense of duty, plus the office mostly vacated of people she has known so long.  So, on many fronts, very welcome to be out and into the next phase of her life.  Plans today to go to book store and load up.  Had worried she won't know herself not working, but quickly embracing both being married and being retired.  Discussed foreseeable stress culling out possessions as she begins preparing to ready her own house for sale, in preparation of finding a new place she and Ashlee Chavez can call their fresh start together.  Ashlee Chavez's house is cleared and on the market now.  Next project is to begin winnowing at her house.  Brainstormed ideas for where she can take things she would hope could have another life after hers.  Re family, is nervous about meeting more of Ashlee Chavez's people.  Acknowledged she trusts him to carry conversation when she can't.  Encouraged that she will be able to answer questions, make conversation, and take breaks when  needed to cool social anxiety, plus Ashlee Chavez will be her active and apt escort among his people.    Bible study weekly, difficult for wanting to say something but having nothing   Therapeutic modalities: Cognitive Behavioral Therapy, Solution-Oriented/Positive Psychology, and Ego-Supportive  Mental Status/Observations:  Appearance:   Casual     Behavior:  Appropriate  Motor:  Normal  Speech/Language:   Clear and Coherent  Affect:  Appropriate  Mood:  euthymic  Thought process:  normal  Thought content:    WNL  Sensory/Perceptual disturbances:    WNL  Orientation:  Fully oriented  Attention:  Good    Concentration:  Good  Memory:  WNL  Insight:    Good  Judgment:   Good  Impulse Control:  Good   Risk Assessment: Danger to Self: No Self-injurious Behavior: No Danger to Others: No Physical Aggression / Violence: No Duty to Warn: No Access to Firearms a concern: No  Assessment of progress:  progressing well  Diagnosis:   ICD-10-CM   1. Major depressive disorder, recurrent, in remission (HCC)  F33.40     2. Social anxiety disorder  F40.10     3. Mixed obsessional thoughts and acts  F42.2      Plan:  Adjustment pressures -- Note and value the relief inherent in leaving a changed workplace.  Embrace retirement living with plan or short menu of things to do with suddenly unstructured time as well as things worth getting out of  the house for when a job is not one of them.  Endorse plans to rejoin gym, read copiously, and walk.  For new marriage, embrace the change as enlarging, not diminishing her life and autonomy, and continue to practice open communication.  Endorse culling possessions and beginning in a home new to both of them. For social anxiety and OCD attached to relationship -- Experiment further in letting [name] do things at the house, accompany her to things, and repeat and progress in letting him see her feet and other aspects of her body which may be ego dystonic to  her.  Practice letting it be strange but true that she commends the attention and interest of a kind and suitable man.  Practice letting it not have to be the same feeling she had with Ashlee Chavez to be legitimate love.  Self-affirm that being capable of living by herself is an asset, not cause for doubt, and that strong, adolescent feelings of being in love are both more difficult at her age and suppressed by a range of things; her relationship with Ashlee Chavez has all the behavioral  hallmarks of being very strong and good, and the premarital inventory supports that.  To break down too good to be true feelings, road-test conflict -- broach the subject of dealbreakers and strong preferences, solicit what Ashlee Chavez's would be, and work up assertive requests where needed (e.g., less talking during TV/movie).  The rest is about accommodating shared space and control after being so used to living alone and longsuffering; challenge to allow herself to dress down, and to take some amount of alone time while together, and to talk about it together, as practice for healthy transition to married life.  Practice noticing intrusive thoughts and asking herself how much she'd actually bet they're true if she put money on it. CBT/SFT tactics for worry/anxiety/OCD -- Conceive of worrying voice as Ashlee Chavez, her inner critic who is less of an enemy than an overworried friend.  Continue to seek serendipitous exposure opportunities to engage or let herself show and notice how it works out.  PRN look for opportunities to challenge her worrying mind by coming up with mistakes that she can either imagine or fake, and in any case look for a way to laugh about her own mind and intrusive worries.   Physiological self-care -- Maintain vitamin supplements, include omega-3 for anti-inflammatory benefits and brain health.  Work with timing on medication adjustments to maximize rest at night and alertness by day.  Lip tremor and psychomotor  retardation, along with reported fatigue, could signal nutritional deficiencies, e.g., B1, B6, B12, D, E, or a medication-related issue -- work with psychiatry as needed or take B complex prophylactically. Longterm bereavement -- Seek to notice and accept all grief feelings when they come, no judgment Other recommendations/advice -- As may be noted above.  Continue to utilize previously learned skills ad lib. Medication compliance -- Maintain medication as prescribed and work faithfully with relevant prescriber(s) if any changes are desired or seem indicated. Crisis service -- Aware of call list and work-in appts.  Call the clinic on-call service, 988/hotline, 911, or present to [facility] or ER if any life-threatening psychiatric crisis. Followup -- Return in about 6 weeks (around 07/23/2024) for time at discretion.  Next scheduled visit with me Visit date not found.  Next scheduled in this office 07/05/2024.  Lamar Kendall, PhD Ashlee Kendall, PhD LP Clinical Psychologist, [facility] [address], [id number]  [place], [place] [address] [phone]

## 2024-06-15 DIAGNOSIS — G43719 Chronic migraine without aura, intractable, without status migrainosus: Secondary | ICD-10-CM | POA: Diagnosis not present

## 2024-06-29 DIAGNOSIS — D225 Melanocytic nevi of trunk: Secondary | ICD-10-CM | POA: Diagnosis not present

## 2024-06-29 DIAGNOSIS — L821 Other seborrheic keratosis: Secondary | ICD-10-CM | POA: Diagnosis not present

## 2024-06-29 DIAGNOSIS — L814 Other melanin hyperpigmentation: Secondary | ICD-10-CM | POA: Diagnosis not present

## 2024-06-29 DIAGNOSIS — L720 Epidermal cyst: Secondary | ICD-10-CM | POA: Diagnosis not present

## 2024-07-05 ENCOUNTER — Ambulatory Visit (INDEPENDENT_AMBULATORY_CARE_PROVIDER_SITE_OTHER): Payer: Self-pay | Admitting: Psychiatry

## 2024-07-05 ENCOUNTER — Encounter: Payer: Self-pay | Admitting: Psychiatry

## 2024-07-05 DIAGNOSIS — F4010 Social phobia, unspecified: Secondary | ICD-10-CM | POA: Diagnosis not present

## 2024-07-05 DIAGNOSIS — F331 Major depressive disorder, recurrent, moderate: Secondary | ICD-10-CM | POA: Diagnosis not present

## 2024-07-05 DIAGNOSIS — F900 Attention-deficit hyperactivity disorder, predominantly inattentive type: Secondary | ICD-10-CM

## 2024-07-05 DIAGNOSIS — F422 Mixed obsessional thoughts and acts: Secondary | ICD-10-CM | POA: Diagnosis not present

## 2024-07-05 MED ORDER — OLANZAPINE 2.5 MG PO TABS: 2.5000 mg | ORAL_TABLET | Freq: Every day | ORAL | 1 refills | Status: DC

## 2024-07-05 MED ORDER — CLOMIPRAMINE HCL 75 MG PO CAPS: 75.0000 mg | ORAL_CAPSULE | Freq: Every day | ORAL | 0 refills | Status: AC

## 2024-07-05 MED ORDER — FLUVOXAMINE MALEATE 100 MG PO TABS: 50.0000 mg | ORAL_TABLET | Freq: Two times a day (BID) | ORAL | 0 refills | Status: DC

## 2024-07-05 MED ORDER — METHYLPHENIDATE HCL ER (OSM) 36 MG PO TBCR: 72.0000 mg | EXTENDED_RELEASE_TABLET | Freq: Every day | ORAL | 0 refills | Status: DC

## 2024-07-05 NOTE — Progress Notes (Signed)
 Ashlee Chavez [id number] 1961-07-09 63 y.o.  Subjective:   Patient ID:  Ashlee Chavez is a 63 y.o. (DOB [birth year]) female.  Chief Complaint:  Chief Complaint  Patient presents with   Follow-up   Anxiety   Depression   Sleeping Problem    Ashlee Chavez presents today for follow-up of recently worse depression DT relationship loss.    When seen February 10, 2019.  She had received some benefit from Ritalin  20 mg 3 times daily but was having issues with the crash.  We switched to Concerta  72 mg every morning.  September 14, 2019.  She was having persistent bothersome OCD and was interested in further medicine changes to try to help with these symptoms because she has aggressively pursued therapy and still has significant symptoms.  She has been on multiple SSRIs and so we initiated augmentation with risperidone  half milligram daily to increase to 1 mg daily. Sig improvement with risperidone .  SE low appetite with it.  Not hungry for dinner. Sleep so much better also.  Improvement is marked.  Think better bc less obsessing time.  Not constant now.  Noticed benefit within the first week.  Less obsessing lessened depression.  But residual symptoms.  visit October 14, 2019.  She was encouraged to experiment with the dosing of the risperidone  up to 2 mg or 3 mg nightly to see if she could get additional reduction in her chronic anxiety and OCD.   seen December 15, 2019.  Settled at 2 mg risperidone  bc increased sleep.  Sleep is so much better.  Harder to get up in the morning.   Found out insurance won't cover Concerta  anymore.  Not as good on Ritalin  TID.  Not as even a response.  Terrible insurance. Therefore the only med change with switching to Focalin  to see if it would be covered better.  Appt 03/09/20, switch to Concerta  72 mg is better with better mood and motivation vs Focalin .  Focus is also better.  Very poor function with Focalin  and better with Concerta .   Better overall with  more even response to stimulant and better appetite.  No SE.  Duration all day.  Less desperate hopeless feelings on the stimulant.  Stressful times. With increase risperidone  a little better mood bump.  SE a little shakiness noticed in jaw.   Not unusual levels of anxiety.  Lost her best friend 1 year ago and still grieving.  Not much family. Alone.  Not close to B and S who live elsewhere.  Talks to God daily.  Gets to work daily.  Function is good there.  Reads and calls people.  OCD no worse with Covid. Did join a Bible study which was a huge step. Plan start lamotrigine    04/04/20 appt with the following noted: Appt moved up.  She thought she was supposed to stop risperidone  when started lamotrigine .  Started having SI off the risperidone  but wasn't sure if it was SE.  No risperidone  3 mg for 3 weeks.Anxiety shaking sweating crying all started right away.  Also trouble with sleep and can't eat.  Plan:Restart risperidone  1 mg now and another milligram in 2 to 3 hours if not feeling sufficient relief to get the full dose of 3 mg today.  Then resume 3 mg risperidone  each evening. Restart lamotrigine  25 mg daily for 2 weeks and follow the previous instructions on increasing it.  05/11/2020 appointment the following is noted: Marked improvement back on risperidone  3 mg.  SE shakes jaw.  Doesn't affect typing. Started lamotrigine  and up to 100 mg for 2 weeks.  Feels it's helped depression. Better mood and able to laugh.  Able to get pizza for Bible study and not cry. Apptetite is not great.  Working on it. Anxiety is better not gone. Plan: no med changes  07/26/20 appt with the following noted; Increased lamotrigine  gradually to 200 mg daily over the phone 6 weeks ago.  No improvement really for depression. With it. CC depression.   Risperidone  still helping anxiety. Plan: Increase lamotrigine  to 300 mg daily..  09/14/2020 appointment with following noted: Started taper of lamotrigine  DT  NR. Started latuda  20 mg daily for 10 days.  Tolerated fine.  No effects so far.. Main problem is still depression.  Anxiety is not too bad.  No energy, motivation, sleeeping a lot.  No sig SI. In past started risperidone  for anxiety and depression.  Increase Latuda  to 40 mg daily. Disc potential DDI with risperidone  reducing effectiveness of  Latuda  so reduc risperidone  to 1.5 mg HS  10/09/2020 phone call from patient: Patient left a voicemail on Friday while we were out of office for the Holiday that she is not any better. She feels the cutting in the 1/2 of Risperidone  has really increased her anxiety. She was instructed to call back if not any better but concerned that taking away the Risperidone  will really exacerbate her anxiety.  MD response:  At the last appointment we increased Latuda  to 40 mg daily and reduced risperidone  to half tablet daily.  The goal was to use Latuda  and eliminate risperidone .  However since her anxiety has worsened with the reduction in risperidone , I agree with her and she should take the full dose of risperidone  as well as continue the Latuda  40 mg daily.  The Latuda  is intended to help depression.  11/23/2020 appointment with the following noted: Propranolol  only once daily. No better with higher Latuda .  No SE either. No change in appetite which is poor.  Still struggling with depression and OCD is a little worse with changes at work. Off lamotrigine . Plan: 5-HTP supplement 200 mg daily  Increase Latuda  to 60 mg  for 3 weeks, If no benefit and no SE increase to 80 mg daily with food.  ADD still better with Concerta  72 mg versus Focalin . Continue high dosage paroxetine  80 mg daily.  01/16/2021 appointment with following noted: Increase Latuda  to 80 mg without benefit or SE.  Anxiety and depression with poor STM.  Food getting caught in her throat.  No change in eating.  No change in weight.   Anxious thoughts mostly about work even when not there.  Anxious  about work needed on the house she can't afford.  Enjoys reading but feels guilty that she should be doing something else.  More general anxiety than OCD. 5-HTP supplement 200 mg daily Wean Latuda  over a week DT NR Reduce mirtazapine  to 30 mg HS  ADD still better with Concerta  72 mg versus Focalin . Start clomipramine  25 mg capsules 1 at night for 1 week, Then increase clomipramine  to 2 capsules at night and reduce paroxetine  to 1-1/2 tablets or 60 mg daily. Wait 1 week and then get the blood test   03/15/2021 appt noted: OK not great.  About the same.  Not much change in the last 8 weeks. SE none Lab 02/20/21 clomipramine  50 + Luvox  100 = clomip 195 + norclomip 111= total 306 Losing weight and no appetite and  hard to eat. Weight loss to 107#. A lot of anxiety and depression   Sleep less sound with less mirtazapine .  Plan: Due to the severity of symptoms,Add olanzapine  5 mg tablet 1 at night Stop mirtazapine  Reduce risperidone  to 1/2 tablet for 1 week then stop it.  Call if SI recurs.  03/30/2021 appointment with the following noted: It makes me a little tired but so far anxiety is improved.  No problems off risperidone .  Appetite is not better but did get protein bars and is eating.   Less panic.  But still occurs.  Things just started this week to be a little better.  Not huge but it's a start.  A little more energy. Sleep 10 hours but that's unchanged with olanzapine . First time I've had hope in a long time. Plan: A little better with olanzapine  5 mg HS vs risperidone  3 mg. Increase olanzapine  to 10 mg HS  Call in 2 weeks if you are not seeing progressive improvement  04/12/2021 appointment with the following noted: Better with anxiety.  Sleep better.  Daytime could nap if not busy.  Otherwise OK.  Not much change in appetite.  Up to 110# about 2.5# better and trying to get protein twice daily.   A little change in OCD.   Depression is better some and staying up later and doing more than  she was.  Doing some things after dinner which is huge.  Big change with depression.  Not retreating to bed as quickly. Plan: continue meds.  05/18/21 appt noted: Further improvement in anxiety.  I feel better but still not going out but not excessively sleeping.  Acting more normally, eating better.  Eating dinner again.  Sleep reduced to 8 hours. SE dry mouth and a little hungrier. Asks about going up on olanzapine  to further reduce anxiety. [name] and describes better from 10/10 to 4/10 for anxiety and depression down to 3/10.Ashlee Chavez  Anxiety symptoms include: Excessive Worry, Obsessive Compulsive Symptoms:   Handwashing,,.  Pt reports no sleep issues. Pt reports that appetite is good. Pt reports that energy is good and down slightly. Concentration is no change. Suicidal thoughts:  denied by patient. Plan increase olanzapine  to 15 mg HS DT partial benefit  06/22/21 appt noted: Too tired and sleepy with 15 mg olanzapine  and reduced the dose to 10 mg daily. No other med changes. Overall about the same.  Don't do anything different.  Happy that it's better than it was stays up to 930 until 6 PM.  Don't go out except Bible study and church once weekly. Goes to grocery early to avoid people. Anxiety lately worse than the depression. Dx pulsatile tinnitus Plan: cut olanzapine  back to 10 mg HS Increase clomipramine  to 75 mg HS  08/13/2021 appointment with the following noted: Anxiety is good after increase clomipramine .  Used to be desperate to get home and not now.  Was thinking of disability and now it's better.  I texted a guy on a dating site first time in 2 years.  It's a big deal.  Started exercising a little.   SE no worse with increase clomipramine . Depression is better too and manageable with brief downturns.   Sleep is pretty good. Plan no med changes  10/30/2021 appt noted: Pretty good and a little better than last time.  Still getting out a little  with a guy she met on dating site and it's going ok.  Still prefers to be  home.  Still get anxious going out. Overall best response so far with any med for anxiety.   Depression is manageable. SE is OK Staying up more appropriately late and sleep is OK. Plan : Much better with clomipramine  75 HS Also continue olanzapine  10 HS & fluvoxamine  50 Benefit Concerta  72 mg a.m. Taking propranolol  20 BID for tremor No med changes  01/29/2022 appointment with the following noted: Pretty good and probably doing better. Handled triggers with less obsessions.  Had to have bx breast today and hasn't been obsessing like she would ususally.  Better than she ever expected. Surprising. Feels the med has hit it's stride at this point. No sig caffeine.  Tremor managed. Depresion managed. Concerta  is fine without problems.  Not noticeable when it wears off. Sleep good lately. Yes increase fluvoxamine  to 100 mg HS and 50 mg AM Repeat clomipramine  level .    02/11/22 lab noted: She is now on fluvoxamine  150 mg daily and clomipramine  75 mg daily.  It is known that fluvoxamine  can affect the blood level of clomipramine  but that is not a problem in her case.  We increased the fluvoxamine  at her last appointment on January 29, 2022 to the current dosage.  Let her know there is room to increase either fluvoxamine  to 200 mg daily which can be split between morning and evening if desired or taken all at night if desired.  Or the other alternative if she prefers is to increase clomipramine  to 100 mg at night.  Either 1 of those options may help further decrease anxiety and obsessions.  However if she would prefer to keep the dosage the same that is okay as well. Lorene Macintosh, MD, DFAPA  04/30/22 appt noted: Tried increasing clomipramine  and then fluvoxamine  and it made her too sleepy. Depression managed. Still a little OCD and anxiety but pretty much managed unless triggers. Getting out socially and dating which causes  anxiety but doing OK. Tolerating meds ok now. Sleep 9-5.  Likes to nap but not usually. No SI in a long time. Plan: Could not tolerate fluvoxamine  100 or clomipramine  100 DT sleepiness Therefore continue fluvoxamine  50 mg twice daily and clomipramine  75 mg nightly Continue olanzapine  10 mg nightly ADD still better with Concerta  72 mg versus Focalin . 5-HTP supplement 200 mg daily No med changes today . . 10/01/2022 appointment noted: Continues same med.  Insurance won't pay for more than 1 tablet daily fluvoxamine . SE some tiredness but sleeps well. Manageable depression .  Anxiety better with meds but not gone.   Concerta  still helping. Plan: Therefore continue fluvoxamine  50 mg twice daily and clomipramine  75 mg nightly Continue olanzapine  10 mg nightly ADD still better with Concerta  72 mg versus Focalin . Propranolol  40 AM Topomax 100 BID 5-HTP supplement 200 mg daily No med changes today  04/02/23 appt noted: Meds as above, takes Xanax  0.25 mg on Sun afternoons bc anxious about work the next day.  Helps. Still happiest at home on couch but forces herself to get out and it usually works out fine.   Dep is usually ok and not like it was before at all. Concerta  ok for attention. SE tremor and nothing else.  Plan no changes  12/15/23 appt noted: Psych meds: Alprazolam  0.25 mg daily as needed, clomipramine  75 nightly, fluvoxamine  100 mg tablet, one half twice daily; Concerta  72 AM, olanzapine  10 nightly, propranolol  20 mg tablets 1-2 twice daily as needed tremor, topiramate  100 twice daily for headache, Imitrex as needed migraine  100 mg. Ok.  Very even keel.  Don't cry even when might feel like it.  Still worry about the same.  Not worse.  Fear of mistakes at work.  New owners of business.   Dep about 5/10 is about the same.  Not as interested in enjoyable activity as normal.   Might retire in June.   HA under control. Plan: reduc olanzapine  to 7.5 mg HS.  02/17/24 appt noted: Med:  reduced olanzapine  5 mg HS. Others same. Notices a little more emotional variability but still blunted more than she would like.  Nothing got worse. Will find out in May if they are going to retire her or not.   Anxiety is not sig worse.    05/13/24 appt noted:  Med: reduced olanzapine  5 mg HS. Others same. Pretty much the same.  Emo blunted.  No excitement.  Still depressed.  It's not awful.  Used to have a great sense of humor and I miss that.  She thinks it is on of the med blunting her.   Fatigue.  Sleep a lot.  No SI. Anxiety has been ok and OCD is ok.   rETIRES END OF THIS MONTH.  She has plans to stay busy.  Just got married and will get a new house.  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  Will get in Bible Study and book club and gym. Plan: wean off olanzapine  DT emo blunting  07/05/24 appt noted: Med, stopped olanzapine . Emo blunting 35% better but anxiety 50%worse.  Don't sleep as well without it.   Retired.  Relaxing .  Reading a lot.   Bible Study and book club and gym. Lost 6 # off olanzapine .   Numerous failed psychiatric medication trials include Lexapro 60 mg a day, Zoloft 250, Paxil  with weight gain,  Fluvoxamine  100 Clomipramine  50,  (Could not tolerate fluvoxamine  100 or clomipramine  100 DT sleepiness Therefore continue fluvoxamine  50 mg twice daily and clomipramine  75 mg nightly)  Wellbutrin 300 with side effects,  mirtazapine  Rexulti which helped initially,  Vraylar, Abilify between 1 and 2.5 mg and up to 15 mg with no response,  Latuda  80 failed Risperidone  Olanzapine  15 sedation.  Lamotrigine  300 NR buspirone 30 mg,  Cytomel,  lithium 625,  pramipexole with insomnia,   Lunesta with crying spells,  topiramate , Deplin, propranolol ,   Ritalin , Focalin  NR, Concerta  72 helped No history of Prozac, Viibryd.  Review of Systems:  Review of Systems  Constitutional:  Negative for appetite change, diaphoresis and unexpected weight change.  HENT:   Negative for tinnitus.   Cardiovascular:  Negative for palpitations.  Neurological:  Positive for tremors and headaches. Negative for dizziness and light-headedness.       Mouth  Psychiatric/Behavioral:  Negative for agitation, behavioral problems, confusion, decreased concentration, hallucinations, self-injury, sleep disturbance and suicidal ideas. The patient is nervous/anxious. The patient is not hyperactive.     Medications: I have reviewed the patient's current medications.  Current Outpatient Medications  Medication Sig Dispense Refill   ALPRAZolam  (XANAX ) 0.25 MG tablet Take 1 tablet (0.25 mg total) by mouth daily as needed for anxiety. 30 tablet 0   Ascorbic Acid (VITAMIN C) 500 MG CHEW 1 tablet Orally Once a day     atorvastatin  (LIPITOR) 20 MG tablet Take 1 tablet (20 mg total) by mouth daily. 90 tablet 3   calcium  carbonate (SUPER CALCIUM ) 1500 (600 Ca) MG TABS tablet 1 tablet with meals Orally Twice a day for 30 day(s)  Calcium  Carbonate-Vitamin D  (OSCAL 500/200 D-3 PO) Take by mouth.     Cholecalciferol (VITAMIN D  PO) Take by mouth.     Cholecalciferol (VITAMIN D3) 25 MCG (1000 UT) CAPS 1 capsule Orally Once a day for 30 day(s)     clomiPRAMINE  (ANAFRANIL ) 75 MG capsule Take 1 capsule (75 mg total) by mouth at bedtime. 90 capsule 0   fluvoxaMINE  (LUVOX ) 100 MG tablet Take 0.5 tablets (50 mg total) by mouth 2 (two) times daily. 90 tablet 0   levothyroxine  (SYNTHROID ) 75 MCG tablet Take 1 tablet (75 mcg total) by mouth daily. 90 tablet 3   methylphenidate  36 MG PO CR tablet Take 2 tablets (72 mg total) by mouth daily. 180 tablet 0   propranolol  (INDERAL ) 20 MG tablet TAKE 1-2 TABLETS BY MOUTH TWICE A DAY AS NEEDED FOR TREMORS 360 tablet 0   SUMAtriptan (IMITREX) 100 MG tablet Take 100 mg by mouth daily as needed for migraine.     topiramate  (TOPAMAX ) 100 MG tablet Take 1 tablet (100 mg total) by mouth 2 (two) times daily. 180 tablet 1   benzoyl peroxide 5 % gel 1 application  Externally Once a day for 14 days     folic acid (FOLVITE) 1 MG tablet 1 tablet Orally Once a day for 30 day(s)     methylphenidate  36 MG PO CR tablet Take 2 tablets (72 mg total) by mouth daily. 60 tablet 0   OLANZapine  (ZYPREXA ) 2.5 MG tablet Take 1 tablet (2.5 mg total) by mouth at bedtime. (Patient not taking: Reported on 07/05/2024) 90 tablet 1   No current facility-administered medications for this visit.    Medication Side Effects:  Watch jaw tremor  Allergies:  Allergies  Allergen Reactions   Ampicillin Rash    Past Medical History:  Diagnosis Date   Gum lesion    Biopsy done-benign   Hypercholesteremia    Hypothyroid    LGSIL (low grade squamous intraepithelial dysplasia) 2010   Migraines    OCD (obsessive compulsive disorder)    Osteoporosis 01/2016   T score -2.7    Family History  Problem Relation Age of Onset   Hypertension Mother    Heart disease Mother    Hypertension Father    Breast cancer Paternal Aunt        Age 81   Diabetes Neg Hx     Social History   Socioeconomic History   Marital status: Single    Spouse name: Not on file   Number of children: Not on file   Years of education: Not on file   Highest education level: Not on file  Occupational History   Not on file  Tobacco Use   Smoking status: Never   Smokeless tobacco: Never  Vaping Use   Vaping status: Never Used  Substance and Sexual Activity   Alcohol use: Yes    Alcohol/week: 0.0 standard drinks of alcohol    Comment: wine occassionally   Drug use: No   Sexual activity: Not Currently    Birth control/protection: Post-menopausal    Comment: 1st intercourse 69 yo-5 partners  Other Topics Concern   Not on file  Social History Narrative   Not on file   Social Drivers of Health   Financial Resource Strain: Not on file  Food Insecurity: Not on file  Transportation Needs: Not on file  Physical Activity: Not on file  Stress: Not on file  Social Connections: Not on file   Intimate Partner Violence: Not on  file    Past Medical History, Surgical history, Social history, and Family history were reviewed and updated as appropriate.   Please see review of systems for further details on the patient's review from today.   Objective:   Physical Exam:  LMP 11/19/2010   Physical Exam Constitutional:      General: She is not in acute distress.    Appearance: Normal appearance. She is well-developed.  Musculoskeletal:        General: No deformity.  Neurological:     Mental Status: She is alert and oriented to person, place, and time.     Motor: Tremor present.     Coordination: Coordination normal.     Comments: Mild mouth tremor and diminished blink rate may be more prominent.  Psychiatric:        Attention and Perception: She is attentive. She does not perceive auditory hallucinations.        Mood and Affect: Mood is anxious and depressed. Affect is blunt. Affect is not labile, angry or inappropriate.        Speech: Speech is not delayed.        Behavior: Behavior is not slowed.        Thought Content: Thought content is not paranoid or delusional. Thought content does not include homicidal or suicidal ideation. Thought content does not include suicidal plan.        Cognition and Memory: Cognition normal.        Judgment: Judgment normal.     Comments: Insight intact. No auditory or visual hallucinations. No delusions. Less checking. Gradually improving OCD No change in dep off olanzapine . Anxiety worse off olanzapine .     Lab Review:     Component Value Date/Time   NA 143 02/05/2024 0805   K 4.5 02/05/2024 0805   CL 110 02/05/2024 0805   CO2 26 02/05/2024 0805   GLUCOSE 96 02/05/2024 0805   BUN 16 02/05/2024 0805   CREATININE 0.87 02/05/2024 0805   CALCIUM  9.6 02/05/2024 0805   PROT 7.0 05/16/2023 0750   ALBUMIN 4.4 05/16/2023 0750   AST 18 05/16/2023 0750   ALT 19 05/16/2023 0750   ALKPHOS 51 05/16/2023 0750   BILITOT 0.3 05/16/2023  0750   GFRNONAA >60 11/20/2021 0703       Component Value Date/Time   WBC 6.9 11/20/2021 0703   RBC 4.72 11/20/2021 0703   HGB 15.4 (H) 11/20/2021 0703   HCT 46.3 (H) 11/20/2021 0703   PLT 156 11/20/2021 0703   MCV 98.1 11/20/2021 0703   MCH 32.6 11/20/2021 0703   MCHC 33.3 11/20/2021 0703   RDW 13.1 11/20/2021 0703   LYMPHSABS 1.0 11/20/2021 0703   MONOABS 0.4 11/20/2021 0703   EOSABS 0.1 11/20/2021 0703   BASOSABS 0.0 11/20/2021 0703    No results found for: POCLITH, LITHIUM   No results found for: PHENYTOIN, PHENOBARB, VALPROATE, CBMZ   .res Assessment: Plan:    Judiann was seen today for follow-up, anxiety, depression and sleeping problem.  Diagnoses and all orders for this visit:  Attention deficit hyperactivity disorder (ADHD), predominantly inattentive type -     methylphenidate  36 MG PO CR tablet; Take 2 tablets (72 mg total) by mouth daily.  Social anxiety disorder -     clomiPRAMINE  (ANAFRANIL ) 75 MG capsule; Take 1 capsule (75 mg total) by mouth at bedtime. -     OLANZapine  (ZYPREXA ) 2.5 MG tablet; Take 1 tablet (2.5 mg total) by mouth at bedtime. (Patient not taking:  Reported on 07/05/2024)  Mixed obsessional thoughts and acts -     clomiPRAMINE  (ANAFRANIL ) 75 MG capsule; Take 1 capsule (75 mg total) by mouth at bedtime. -     OLANZapine  (ZYPREXA ) 2.5 MG tablet; Take 1 tablet (2.5 mg total) by mouth at bedtime. (Patient not taking: Reported on 07/05/2024)  Major depressive disorder, recurrent episode, moderate (HCC) -     OLANZapine  (ZYPREXA ) 2.5 MG tablet; Take 1 tablet (2.5 mg total) by mouth at bedtime. (Patient not taking: Reported on 07/05/2024)  Other orders -     fluvoxaMINE  (LUVOX ) 100 MG tablet; Take 0.5 tablets (50 mg total) by mouth 2 (two) times daily.     We discussed TRD and anxiety . she has a long list of failed psychiatric meds.  She has not tended to do well with higher dosages of SSRIs.  OCD handwashing & checking and dep  under pretty good control. Overall she is satisfied with the current med regimen  Patient has been markedly depressed and anxious but, much better Since adding olanzapine  10 mg she has seen a significant improvement in depression and anxiety. Much better with clomipramine  75 HS  Chart review revealed that prior to starting treatment here patient had taken clomipramine  in the past with some benefit but also with some side effects.  It is a tricyclic antidepressant distinctly different from SSRIs.  Some patients respond better to them.  She has a history of previous benefit.   We discussed the value of doing blood levels with tricyclic antidepressants like clomipramine .  She agrees.  Lab 02/20/21 clomipramine  50 + Luvox  100 = clomip 195 + norclomip 111= total 306 (normal 220-500) Better with Increase clomipramine  to 75 mg HS 02/11/2022 clomipramine  level 183 history people, nor clomipramine  level 89, total 272 on clomipramine  75 mg nightly and fluvoxamine  50 mg twice daily.  Disc dental risks and SE risks with meds.  HA managed  Could not tolerate fluvoxamine  100 BID or clomipramine  100 DT sleepiness Therefore continue fluvoxamine  50 mg twice daily and clomipramine  75 mg nightly ADD still better with Concerta  72 mg versus Focalin . 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 Ashlee Chavez She is continuing psychotherapy with Dr. Marijean  Discussed potential metabolic side effects associated with atypical antipsychotics, as well as potential risk for movement side effects. Advised pt to contact office if movement side effects occur.   Consider Auvelity for residual depression.  Consider alternative to olanzapine  for TR anxiety.  Disc SE of options.  When she retires plans to read and not work [employer] about the job.  Follow-up in 4-6  mos  Lorene Macintosh MD,  DFAPA  Future Appointments  Date Time Provider Department Center  07/23/2024  9:00 AM Marijean [place], PhD [facility] None  08/24/2024  9:20 AM Thapa, [place], MD [facility] None  02/28/2025  4:00 PM Prentiss Riggs A, NP [facility] None    No orders of the defined types were placed in this encounter.      -------------------------------

## 2024-07-12 ENCOUNTER — Other Ambulatory Visit: Payer: Self-pay | Admitting: Psychiatry

## 2024-07-12 DIAGNOSIS — R251 Tremor, unspecified: Secondary | ICD-10-CM

## 2024-07-23 ENCOUNTER — Ambulatory Visit: Admitting: Psychiatry

## 2024-07-23 DIAGNOSIS — F3340 Major depressive disorder, recurrent, in remission, unspecified: Secondary | ICD-10-CM | POA: Diagnosis not present

## 2024-07-23 DIAGNOSIS — F4010 Social phobia, unspecified: Secondary | ICD-10-CM | POA: Diagnosis not present

## 2024-07-23 DIAGNOSIS — F422 Mixed obsessional thoughts and acts: Secondary | ICD-10-CM

## 2024-07-23 NOTE — Progress Notes (Unsigned)
 Psychotherapy Progress Note [facility] [name], PhD LP  Patient ID: BAELEIGH DEVINCENT)    MRN: [id number] Therapy format: {Therapy Types:21967::Individual psychotherapy} Date: 07/23/2024      Start: ***:***     Stop: ***:***     Time Spent: *** min Location: {SvcLoc:22530::In-person}   Session narrative (presenting needs, interim history, self-report of stressors and symptoms, applications of prior therapy, status changes, and interventions made in session) Retiremenet feeling good.  [name] house on the market.  Recently started sifting through her own things, guiding herself into letting go things, vs. What if thoughts about wearing or using things again.  Not obsessing about moving, or living in the house to come, more unambguously looking forward to it.  Relationship living totgether, full time now., with him working from home, is going very smoothly.   Nobody getting on each other's nerves or worried sick about doing so, just communicating and getting signals there are to get with each other.  [name] has successfully helped her decondition a 10/10 self-conscious cringe factor about her image -- now at about 5/10.  Brainstormed experiments in reducing self-consciousness, one being to skip eye makeup.  Not willing yet, but encouraged to look for opportunities, recruit [employer] where it makes sense.  Resolved to look for a beginner cookbook and to try a low-demand offering like a sandwich and some fruit, or breakfast for supper (eggs +) as step in   Medically, resumed 2.5mg  olanzapine  for anxiety, having noticed wakefulness and worry about selling the house, etc.  Other adjustments made delegating details to [name] [employer], repairs).  Worry will always be the What if of borrowing against [name] 401k and running out of time till penalty.  Can get up and read if too wakeful.  Recommended finding out what the actual math would be, to shrink the perceived  catastrophe.  Also advised to get up with [name] and get a visual representation of how the money moves for repairs and financing.  Other than that, recommended taking a prove it approach, maybe even imagining whether she would wager $1 on an intrusive thought (and, on recognizing not, rejecting it as an intrusive thought).    Therapeutic modalities: {AM:23362::Cognitive Behavioral Therapy,Solution-Oriented/Positive Psychology}  Mental Status/Observations:  Appearance:   {PSY:22683}     Behavior:  {PSY:21022743}  Motor:  {PSY:22302}  Speech/Language:   {PSY:22685}  Affect:  {PSY:22687}  Mood:  {PSY:31886}  Thought process:  {PSY:31888}  Thought content:    {PSY:225 801 0469}  Sensory/Perceptual disturbances:    {PSY:[phone]}  Orientation:  {Psych Orientation:23301::Fully oriented}  Attention:  {Good-Fair-Poor ratings:23770::Good}    Concentration:  {Good-Fair-Poor ratings:23770::Good}  Memory:  {PSY:8430647198}  Insight:    {Good-Fair-Poor ratings:23770::Good}  Judgment:   {Good-Fair-Poor ratings:23770::Good}  Impulse Control:  {Good-Fair-Poor ratings:23770::Good}   Risk Assessment: Danger to Self: {Risk:22599::No} Self-injurious Behavior: {Risk:22599::No} Danger to Others: {Risk:22599::No} Physical Aggression / Violence: {Risk:22599::No} Duty to Warn: {AMYesNo:22526::No} Access to Firearms a concern: {AMYesNo:22526::No}  Assessment of progress:  {Progress:22147::progressing}  Diagnosis: No diagnosis found. Plan:  *** Other recommendations/advice -- As may be noted above.  Continue to utilize previously learned skills ad lib. Medication compliance -- Maintain medication as prescribed and work faithfully with relevant prescriber(s) if any changes are desired or seem indicated. Crisis service -- Aware of call list and work-in appts.  Call the clinic on-call service, 988/hotline, 911, or present to [facility] or ER if any life-threatening psychiatric  crisis. Followup -- Return in about 6 weeks (around 09/03/2024) for time as available.  Next scheduled visit with me Visit date not found.  Next scheduled in this office 11/01/2024.  Lamar Kendall, PhD [name], PhD LP Clinical Psychologist, [facility] [address] [place], [place] [address] [phone]

## 2024-08-12 DIAGNOSIS — H52223 Regular astigmatism, bilateral: Secondary | ICD-10-CM | POA: Diagnosis not present

## 2024-08-13 ENCOUNTER — Ambulatory Visit: Admitting: Endocrinology

## 2024-08-18 ENCOUNTER — Encounter: Payer: Self-pay | Admitting: Nurse Practitioner

## 2024-08-18 ENCOUNTER — Ambulatory Visit: Admitting: Nurse Practitioner

## 2024-08-18 VITALS — BP 108/62 | HR 69

## 2024-08-18 DIAGNOSIS — N950 Postmenopausal bleeding: Secondary | ICD-10-CM | POA: Diagnosis not present

## 2024-08-18 DIAGNOSIS — R109 Unspecified abdominal pain: Secondary | ICD-10-CM | POA: Diagnosis not present

## 2024-08-18 NOTE — Progress Notes (Signed)
   Acute Office Visit  Subjective:    Patient ID: Ashlee Chavez, female    DOB: 07-30-1961, 63 y.o.   MRN: [id number]   HPI 63 y.o. presents today for vaginal bleeding. Bleeding started 10/2 and lasted 5 days, very light, only with wiping. Denies urinary or vaginal symptoms. Has intermittent abdominal cramping since earlier this year. Not sexually active. Menopausal since 2012.   Patient's last menstrual period was 11/19/2010.    Review of Systems  Constitutional: Negative.   Gastrointestinal:  Positive for abdominal pain (Cramping).  Genitourinary:  Positive for vaginal bleeding (Resolved).       Objective:    Physical Exam Constitutional:      Appearance: Normal appearance.  Genitourinary:    General: Normal vulva.     Vagina: Normal. No bleeding.     Cervix: Normal.     Uterus: Normal.      Adnexa: Right adnexa normal and left adnexa normal.     Comments: Atrophic changes    BP 108/62   Pulse 69   LMP 11/19/2010   SpO2 97%  Wt Readings from Last 3 Encounters:  02/25/24 121 lb (54.9 kg)  02/12/24 121 lb 9.6 oz (55.2 kg)  08/14/23 119 lb 6.4 oz (54.2 kg)        Ashlee Chavez, CMA present as chaperone.   Assessment & Plan:   Problem List Items Addressed This Visit   None Visit Diagnoses       PMB (postmenopausal bleeding)    -  Primary   Relevant Orders   US  PELVIS TRANSVAGINAL NON-OB (TV ONLY)     Abdominal cramping       Relevant Orders   US  PELVIS TRANSVAGINAL NON-OB (TV ONLY)      Plan: Schedule U/S. No bleeding today. Significant atrophy.     Ashlee DELENA Shutter DNP, 11:45 AM 08/18/2024

## 2024-08-19 ENCOUNTER — Other Ambulatory Visit

## 2024-08-19 ENCOUNTER — Ambulatory Visit: Payer: Self-pay | Admitting: Nurse Practitioner

## 2024-08-19 ENCOUNTER — Other Ambulatory Visit: Payer: Self-pay | Admitting: Nurse Practitioner

## 2024-08-19 DIAGNOSIS — R109 Unspecified abdominal pain: Secondary | ICD-10-CM | POA: Diagnosis not present

## 2024-08-19 DIAGNOSIS — N952 Postmenopausal atrophic vaginitis: Secondary | ICD-10-CM

## 2024-08-19 DIAGNOSIS — N950 Postmenopausal bleeding: Secondary | ICD-10-CM | POA: Diagnosis not present

## 2024-08-19 MED ORDER — ESTRADIOL 0.01 % VA CREA: 1.0000 g | TOPICAL_CREAM | VAGINAL | 1 refills | Status: DC

## 2024-08-24 ENCOUNTER — Ambulatory Visit: Admitting: Endocrinology

## 2024-08-30 ENCOUNTER — Ambulatory Visit: Admitting: Endocrinology

## 2024-08-30 ENCOUNTER — Telehealth: Payer: Self-pay | Admitting: Pharmacy Technician

## 2024-08-30 ENCOUNTER — Other Ambulatory Visit

## 2024-08-30 ENCOUNTER — Encounter: Payer: Self-pay | Admitting: Endocrinology

## 2024-08-30 VITALS — BP 90/60 | HR 68 | Resp 16 | Ht 63.5 in | Wt 119.2 lb

## 2024-08-30 DIAGNOSIS — E559 Vitamin D deficiency, unspecified: Secondary | ICD-10-CM

## 2024-08-30 DIAGNOSIS — M810 Age-related osteoporosis without current pathological fracture: Secondary | ICD-10-CM

## 2024-08-30 DIAGNOSIS — E063 Autoimmune thyroiditis: Secondary | ICD-10-CM | POA: Diagnosis not present

## 2024-08-30 LAB — BASIC METABOLIC PANEL WITH GFR
BUN: 12 mg/dL (ref 7–25)
CO2: 26 mmol/L (ref 20–32)
Calcium: 9.3 mg/dL (ref 8.6–10.4)
Chloride: 109 mmol/L (ref 98–110)
Creat: 0.74 mg/dL (ref 0.50–1.05)
Glucose, Bld: 90 mg/dL (ref 65–99)
Potassium: 3.9 mmol/L (ref 3.5–5.3)
Sodium: 140 mmol/L (ref 135–146)
eGFR: 90 mL/min/1.73m2 (ref >=60)

## 2024-08-30 LAB — TSH: TSH: 4.06 m[IU]/L (ref 0.40–4.50)

## 2024-08-30 LAB — T4, FREE: Free T4: 1 ng/dL (ref 0.8–1.8)

## 2024-08-30 LAB — VITAMIN D 25 HYDROXY (VIT D DEFICIENCY, FRACTURES): Vit D, 25-Hydroxy: 47 ng/mL (ref 30–100)

## 2024-08-30 NOTE — Patient Instructions (Signed)
 Plan for reclast  infusion this year.

## 2024-08-30 NOTE — Progress Notes (Signed)
 Outpatient Endocrinology Note [place] Dylon Correa, MD  08/30/24  Patient's Name: Ashlee Chavez    DOB: [birth year]    MRN: [id number]  REASON OF VISIT: Follow-up for hypothyroidism / osteoporosis  PCP: Elliot Charm, MD  HISTORY OF PRESENT ILLNESS:   Ashlee Chavez is a 63 y.o. old female with past medical history as listed below is presented for a follow up of hypothyroidism / osteoporosis.   Pertinent Thyroid  History: - Patient was diagnosed with hypothyroidism  in 1996.  She has positive thyroid  peroxidase antibody consistent with Hashimoto's thyroiditis.  She has been on thyroxine since 1996.  Levothyroxine  dose has been adjusted multiple times over time.  She does have a history of a small goiter previously.   -In July 2024 levothyroxine  dose was decreased from 100 mcg to 88 mcg daily when TSH was low 0.06.  Was decreased to 75 mcg daily in March 2025 when TSH was low 0.21.  She had normal thyroid  function test in June, 2025.  # Osteoporosis: -She had bone mineral density in 2017 showing T-score at femoral neck -2.7, previously -1.8 and a spine -1.9, by complications she had a decline of 8 to 10% in her bone density from 2 years before and 15% resolved from baseline.  She could not afford Evista  and did not want to consider Reclast  previously because of cost.  In July 2017 she was started on treatment with risedronate  150 mg for her declining bone density.  She completed 5 years regimen and was stopped in August 2022.  She was started on Reclast  after her T-score at the femoral neck was -2.6 on DEXA scan in May 2023.  BONE density: Previous study done by gynecologist in April 2021 shows T-score of the femoral neck to be -2.4 compared to -2.7 In 03/2022 with T score at the femoral neck is -2.6  She has been on calcium  supplement regular on Os-Cal. She is been taking vitamin D3 5000 international unit daily with normal vitamin D  level consistently.  First dose of Reclast  infusion  was in August 2023, second dose of Reclast  in July 11, 2023.  Interval history  Patient has been taking levothyroxine  75 mcg daily.  She had normal thyroid  function test on this dose in June.  She denies palpitation and heat intolerance.  She is overall feeling good.  She is due for Reclast .  Last Reclast  was in August 2024.  She has no fall or fracture.  She had bone density test completed in June, reviewed, consistent with osteoporosis with lowest T-score of -2.5 in femoral neck/total.  She has been taking vitamin D3 4000 international unit daily and calcium  1 tablet daily.  She also drinks milk every day.  EXAM: April 21, 2024, reviewed. DUAL X-RAY ABSORPTIOMETRY (DXA) FOR BONE MINERAL DENSITY 04/21/2024 9:15 am   CLINICAL DATA:  63 year old Female Postmenopausal. Screening for osteoporosis   TECHNIQUE: An axial (e.g., hips, spine) and/or appendicular (e.g., radius) exam was performed, as appropriate, using GE Lunar DXA system densitometer at [employer]. Images are obtained for bone mineral density measurement and are not obtained for diagnostic purposes. [id number]   Exclusions: L3-L4 due to degenerative changes   COMPARISON:  None. New baseline.   FINDINGS: Scan quality: Good.   LUMBAR SPINE (L1-L2):   BMD (in g/cm2): 0.897   T-score: -2.3   Z-score: -0.4   LEFT FEMORAL NECK:   BMD (in g/cm2): 0.691   T-score: -2.5   Z-score: -0.8   LEFT  TOTAL HIP:   BMD (in g/cm2): 0.690   T-score: -2.5   Z-score: -1.1   RIGHT FEMORAL NECK:   BMD (in g/cm2): 0.718   T-score: -2.3   Z-score: -0.7   RIGHT TOTAL HIP:   BMD (in g/cm2): 0.721   T-score: -2.3   Z-score: -0.9   FRAX 10-YEAR PROBABILITY OF FRACTURE:   FRAX not reported as the lowest BMD is not in the osteopenia range.   IMPRESSION: Osteoporosis based on BMD.   Fracture risk is increased. Increased risk is based on low BMD.   REVIEW OF SYSTEMS:  As per history of present  illness.   PAST MEDICAL HISTORY: Past Medical History:  Diagnosis Date   Gum lesion    Biopsy done-benign   Hypercholesteremia    Hypothyroid    LGSIL (low grade squamous intraepithelial dysplasia) 2010   Migraines    OCD (obsessive compulsive disorder)    Osteoporosis 01/2016   T score -2.7    PAST SURGICAL HISTORY: Past Surgical History:  Procedure Laterality Date   basal cell excised N/A    BREAST BIOPSY Right    Benign   BREAST LUMPECTOMY  2001   right-benign   CATARACT EXTRACTION  2005   Gum Biopsy     Benign-2019   IR ANGIO EXTERNAL CAROTID SEL EXT CAROTID UNI R MOD SED  11/20/2021   IR ANGIO INTRA EXTRACRAN SEL INTERNAL CAROTID BILAT MOD SED  11/20/2021   IR ANGIO VERTEBRAL SEL VERTEBRAL BILAT MOD SED  11/20/2021    ALLERGIES: Allergies  Allergen Reactions   Ampicillin Rash    FAMILY HISTORY:  Family History  Problem Relation Age of Onset   Hypertension Mother    Heart disease Mother    Hypertension Father    Breast cancer Paternal Aunt        Age 29   Diabetes Neg Hx     SOCIAL HISTORY: Social History   Socioeconomic History   Marital status: Married    Spouse name: Not on file   Number of children: Not on file   Years of education: Not on file   Highest education level: Not on file  Occupational History   Not on file  Tobacco Use   Smoking status: Never   Smokeless tobacco: Never  Vaping Use   Vaping status: Never Used  Substance and Sexual Activity   Alcohol use: Not Currently    Comment: wine occassionally   Drug use: No   Sexual activity: Yes    Partners: Male    Birth control/protection: Post-menopausal    Comment: 1st intercourse 3 yo-5 partners  Other Topics Concern   Not on file  Social History Narrative   Not on file   Social Drivers of Health   Financial Resource Strain: Not on file  Food Insecurity: Not on file  Transportation Needs: Not on file  Physical Activity: Not on file  Stress: Not on file  Social  Connections: Not on file    MEDICATIONS:  Current Outpatient Medications  Medication Sig Dispense Refill   ALPRAZolam  (XANAX ) 0.25 MG tablet Take 1 tablet (0.25 mg total) by mouth daily as needed for anxiety. 30 tablet 0   Ascorbic Acid (VITAMIN C) 500 MG CHEW 1 tablet Orally Once a day     atorvastatin  (LIPITOR) 20 MG tablet Take 1 tablet (20 mg total) by mouth daily. 90 tablet 3   calcium  carbonate (SUPER CALCIUM ) 1500 (600 Ca) MG TABS tablet 1 tablet with meals Orally Twice  a day for 30 day(s)     Calcium  Carbonate-Vitamin D  (OSCAL 500/200 D-3 PO) Take by mouth.     Cholecalciferol (VITAMIN D  PO) Take by mouth.     Cholecalciferol (VITAMIN D3) 25 MCG (1000 UT) CAPS 1 capsule Orally Once a day for 30 day(s)     clomiPRAMINE  (ANAFRANIL ) 75 MG capsule Take 1 capsule (75 mg total) by mouth at bedtime. 90 capsule 0   estradiol (ESTRACE) 0.01 % CREA vaginal cream Place 1 g vaginally 2 (two) times a week. Initial dose: nightly x 2 weeks, then twice weekly 42.5 g 1   levothyroxine  (SYNTHROID ) 75 MCG tablet Take 1 tablet (75 mcg total) by mouth daily. 90 tablet 3   methylphenidate  36 MG PO CR tablet Take 2 tablets (72 mg total) by mouth daily. 180 tablet 0   metoCLOPramide (REGLAN) 10 MG tablet Take by mouth.     propranolol  (INDERAL ) 20 MG tablet TAKE 1-2 TABLETS BY MOUTH TWICE A DAY AS NEEDED FOR TREMORS 360 tablet 0   SUMAtriptan (IMITREX) 100 MG tablet Take 100 mg by mouth daily as needed for migraine.     topiramate  (TOPAMAX ) 100 MG tablet Take 1 tablet (100 mg total) by mouth 2 (two) times daily. 180 tablet 1   tretinoin (RETIN-A) 0.05 % cream Apply topically.     No current facility-administered medications for this visit.    PHYSICAL EXAM: Vitals:   08/30/24 1040  BP: 90/60  Pulse: 68  Resp: 16  SpO2: 94%  Weight: 119 lb 3.2 oz (54.1 kg)  Height: 5' 3.5 (1.613 m)    Body mass index is 20.78 kg/m.  Wt Readings from Last 3 Encounters:  08/30/24 119 lb 3.2 oz (54.1 kg)   02/25/24 121 lb (54.9 kg)  02/12/24 121 lb 9.6 oz (55.2 kg)     General: Well developed, well nourished female in no apparent distress.  HEENT: AT/, no external lesions. Hearing intact to the spoken word Eyes:  Conjunctiva clear and no icterus. Neck: Trachea midline, neck supple without appreciable thyromegaly or lymphadenopathy and no palpable thyroid  nodules Lungs: Clear to auscultation, no wheeze. Respirations not labored Heart: S1S2, Regular in rate and rhythm.  Abdomen: Soft, non tender Neurologic: Alert, oriented, normal speech, deep tendon biceps reflexes normal Extremities: No pedal pitting edema, no tremors of outstretched hands Skin: Warm, color good.  Psychiatric: Does not appear depressed or anxious  PERTINENT HISTORIC LABORATORY AND IMAGING STUDIES:  All pertinent laboratory results were reviewed. Please see HPI also for further details.   TSH  Date Value Ref Range Status  04/13/2024 1.06 0.40 - 4.50 mIU/L Final  02/05/2024 0.21 (L) 0.40 - 4.50 mIU/L Final  08/07/2023 0.38 0.35 - 5.50 uIU/mL Final     ASSESSMENT / PLAN  1. Age-related osteoporosis without current pathological fracture   2. Acquired autoimmune hypothyroidism   3. Vitamin D  deficiency    -Hypothyroidism: She has longstanding history of hypothyroidism on thyroid  hormone replacement.  Hypothyroidism due to Hashimoto's thyroiditis. -She is currently taking levothyroxine  75 mcg daily.  She had normal thyroid  function test on the dose in June. - Check thyroid  function test today.  # Osteoporosis -She was treated with risedronate  for 5 years from 2017-2022.  Reclast  was started , first dose was in August 2023 and second dose was in August 2024. -Last DEXA scan was in June 2025, T-score of -2.5 at femoral neck / total.  Plan: -Continue Reclast  5 mg IV annually.  Plan for Reclast  infusion  this year, order placed. - Repeat DEXA scan in 2 years. -Will consider drug holiday after Reclast  infusion in  1-2 years. -Continue current dose of vitamin D  and calcium  supplement. -Check vitamin D  level today. and BMP with eGFR. -Discussed about fall precaution and weightbearing exercise as tolerated.  Diagnoses and all orders for this visit:  Age-related osteoporosis without current pathological fracture -     Basic metabolic panel with GFR  Acquired autoimmune hypothyroidism -     T4, free -     TSH  Vitamin D  deficiency -     VITAMIN D  25 Hydroxy (Vit-D Deficiency, Fractures)  Other orders -     0.9 %  sodium chloride  infusion -     alteplase (CATHFLO ACTIVASE) injection 2 mg -     heparin  lock flush 100 unit/mL -     sodium chloride  flush (NS) 0.9 % injection 10 mL -     sodium chloride  flush (NS) 0.9 % injection 3 mL -     anticoagulant sodium citrate solution 5 mL -     heparin  lock flush 100 unit/mL -     acetaminophen  (TYLENOL ) tablet 650 mg -     diphenhydrAMINE (BENADRYL) capsule 25 mg -     zoledronic  acid (RECLAST ) injection 5 mg -     famotidine (PEPCID) 20 mg in sodium chloride  0.9 % 50 mL IVPB -     0.9 %  sodium chloride  infusion -     methylPREDNISolone sodium succinate (SOLU-MEDROL) 125 mg/2 mL injection 125 mg -     diphenhydrAMINE (BENADRYL) injection 50 mg -     albuterol (VENTOLIN HFA) 108 (90 Base) MCG/ACT inhaler 2 puff -     EPINEPHrine (EPI-PEN) injection 0.3 mg    DISPOSITION Follow up in clinic in 6 months suggested.  Labs today as ordered.  All questions answered and patient verbalized understanding of the plan.  [place] Jamari Diana, MD [facility] Endocrinology [facility] [address] [place], [address] [place], [place] [address] Phone # [phone]  At least part of this note was generated using voice recognition software. Inadvertent word errors may have occurred, which were not recognized during the proofreading process.

## 2024-08-30 NOTE — Telephone Encounter (Signed)
 Auth Submission: NO AUTH NEEDED Site of care: Site of care: [facility] Payer: [employer] Medication & CPT/J Code(s) submitted: Reclast  (Zolendronic acid) J3489 Diagnosis Code: M81.0 Route of submission (phone, fax, portal):  Phone # Fax # Auth type: Buy/Bill PB Units/visits requested: X1 DOSE Reference number:  Approval from: 08/30/24 to 11/10/24

## 2024-09-01 ENCOUNTER — Ambulatory Visit: Payer: Self-pay | Admitting: Endocrinology

## 2024-09-03 ENCOUNTER — Ambulatory Visit: Admitting: Psychiatry

## 2024-09-03 DIAGNOSIS — F422 Mixed obsessional thoughts and acts: Secondary | ICD-10-CM

## 2024-09-03 DIAGNOSIS — F4010 Social phobia, unspecified: Secondary | ICD-10-CM | POA: Diagnosis not present

## 2024-09-03 DIAGNOSIS — F3340 Major depressive disorder, recurrent, in remission, unspecified: Secondary | ICD-10-CM

## 2024-09-03 NOTE — Progress Notes (Signed)
 Psychotherapy Progress Note [facility] [name], PhD LP  Patient ID: DEMARI KROPP)    MRN: [id number] Therapy format: Individual psychotherapy Date: 09/03/2024      Start: 8:13a     Stop: 8:59a     Time Spent: 46 min Location: In-person   Session narrative (presenting needs, interim history, self-report of stressors and symptoms, applications of prior therapy, status changes, and interventions made in session) Closed on the new, jointly owned, house and beginning to get new and stored things delivered to it.  Finding out how [name] keeps a cluttered desk/office.  Aware that she will struggle, on a neurological basis, with changing her familiar surroundings.  Says he will have to make it home; assured it will simply become home, what she can do is accelerate that or slow it down.  Anticipates a load of stuff from attic and storage that will consume half the garage.  Has reduced some of her kept things -- I was brutal with Christmas things.  [name] remains easygoing, while Aly feels some pressure to get her things packed and moved to make her house ready for sale.  [name] condo closes next month.  Continues to be OK, no anxiety attacks, working through possessions.  Self-consciousness still runs about 5/10, confident it will fade at its own speed.  Have had some financial discussions, now knows he'll get the proceeds from his condo early enough for 401k penalty not to be an issue.  Can wonder/worry where money comes from to pay for things, keeps finding he has another account to pull from that she doesn't understand.  Encouraged to set aside time to go over the array of accounts, preferably visually, so she understands.  Explained vehicles like money market and annuity.  Probed about making wills, beneficiaries, etc., on the agenda.  No felt threat of anxiety doing so.  Encouraged to go ahead and inventory their financials.  Not yet dug into food prep, but will as  freer to.    Asked about OCD matters, she has found it handy to use the wager idea on intrusive fears like whether they'll be happy in the house.  Made [name] a wager of $100 on her prophecy that she wouldn't be happy, and it basically shut her down.  Felt feisty for doing so, and satisfied to have that measure of control.  Re social anxiety, maintaining about self-consciousness and letting herself be seen.  Less call lately to show up for group and family occasions, but it will come with holidays.  Encouraged to forecast and commit, practice letting it be OK to have jitters and growing pains, OK to take time outs to relieve nerves, and notice warm receptions and acceptance she gets.  Therapeutic modalities: Cognitive Behavioral Therapy and Solution-Oriented/Positive Psychology  Mental Status/Observations:  Appearance:   Casual     Behavior:  Appropriate  Motor:  Normal  Speech/Language:   Clear and Coherent  Affect:  Appropriate  Mood:  normal  Thought process:  normal  Thought content:    WNL  Sensory/Perceptual disturbances:    WNL  Orientation:  Fully oriented  Attention:  Good    Concentration:  Good  Memory:  WNL  Insight:    Good  Judgment:   Good  Impulse Control:  Good   Risk Assessment: Danger to Self: No Self-injurious Behavior: No Danger to Others: No Physical Aggression / Violence: No Duty to Warn: No Access to Firearms a concern: No  Assessment of  progress:  progressing  Diagnosis:   ICD-10-CM   1. Social anxiety disorder  F40.10     2. Mixed obsessional thoughts and acts  F42.2     3. Major depressive disorder, recurrent, in remission  F33.40      Plan:  Adjustment pressures -- Note and value the relief inherent in leaving a changed workplace.  Embrace retirement living with plan or short menu of things to do with suddenly unstructured time as well as things worth getting out of the house for when a job is not one of them.  Endorse plans to rejoin  gym, read copiously, and walk.  For new marriage, embrace the change as enlarging, not diminishing her life and autonomy, and continue to practice open communication.  Endorse culling possessions and beginning in a home new to both of them. For social anxiety and OCD attached to relationship -- Experiment further in learning to cohabitate and letting herself be seen less than made up, including any practice letting [name] see aspects of her body which may be ego dystonic to her.  Practice letting it be strange but true that she commands the attention and interest of a kind and suitable man.  Road test conflict where able, broaching any needful subjects and inquiries she might otherwise worry would be taken as intrusive (e.g., less talking during TV/movie).  Seek to accommodate shared space, and control, after being so used to living alone.   CBT/SFT tactics for worry/anxiety/OCD -- Conceive of worrying voice as [name], her inner critic who is less of an enemy than an overworried friend.  Continue to seek serendipitous exposure opportunities to engage or let herself show and notice how it works out.  PRN look for opportunities to challenge her worrying mind by coming up with mistakes that she can either imagine or fake, and in any case look for a way to laugh about her own mind and intrusive worries.  Practice noticing intrusive thoughts and asking herself how much she'd actually bet they're true were she to put money on it. Physiological self-care -- Maintain vitamin supplements, include omega-3 for anti-inflammatory benefits and brain health.  Work with timing on medication adjustments to maximize rest at night and alertness by day.  Lip tremor and psychomotor retardation, along with reported fatigue, could signal nutritional deficiencies, e.g., B1, B6, B12, D, E, or a medication-related issue -- work with psychiatry as needed or take B complex prophylactically.  NAC an option. Longterm bereavement, PRN  -- Seek to notice and accept all grief feelings when they come, no judgment Other recommendations/advice -- As may be noted above.  Continue to utilize previously learned skills ad lib. Medication compliance -- Maintain medication as prescribed and work faithfully with relevant prescriber(s) if any changes are desired or seem indicated. Crisis service -- Aware of call list and work-in appts.  Call the clinic on-call service, 988/hotline, 911, or present to [facility] or ER if any life-threatening psychiatric crisis. Followup -- Return in about 6 weeks (around 10/15/2024) for time as available.  Next scheduled visit with me Visit date not found.  Next scheduled in this office 11/01/2024.  Lamar Kendall, PhD [name], PhD LP Clinical Psychologist, [facility] [address] [place], [place] [address] [phone]

## 2024-09-10 ENCOUNTER — Ambulatory Visit (INDEPENDENT_AMBULATORY_CARE_PROVIDER_SITE_OTHER)

## 2024-09-10 VITALS — BP 92/64 | HR 56 | Temp 98.0°F | Resp 12 | Ht 63.5 in | Wt 118.8 lb

## 2024-09-10 DIAGNOSIS — M810 Age-related osteoporosis without current pathological fracture: Secondary | ICD-10-CM | POA: Diagnosis not present

## 2024-09-10 MED ORDER — ACETAMINOPHEN 325 MG PO TABS
650.0000 mg | ORAL_TABLET | Freq: Once | ORAL | Status: AC
  Administered 2024-09-10: 650 mg via ORAL
  Filled 2024-09-10: qty 2

## 2024-09-10 MED ORDER — DIPHENHYDRAMINE HCL 25 MG PO CAPS
25.0000 mg | ORAL_CAPSULE | Freq: Once | ORAL | Status: AC
  Administered 2024-09-10: 25 mg via ORAL
  Filled 2024-09-10: qty 1

## 2024-09-10 MED ORDER — ZOLEDRONIC ACID 5 MG/100ML IV SOLN
5.0000 mg | Freq: Once | INTRAVENOUS | Status: AC
  Administered 2024-09-10: 5 mg via INTRAVENOUS
  Filled 2024-09-10: qty 100

## 2024-09-10 NOTE — Progress Notes (Signed)
 Diagnosis: Osteoporosis  Provider:  Mannam, Praveen MD  Procedure: IV Infusion  IV Type: Peripheral, IV Location: L Antecubital  Reclast  (Zolendronic Acid), Dose: 5 mg  Infusion Start Time: 1009  Infusion Stop Time: 1039  Post Infusion IV Care: Observation period completed and Peripheral IV Discontinued. 15 minute observation per patient request.  Discharge: Condition: Good, Destination: Home . AVS Provided  Performed by:  Rocky FORBES Sar, RN

## 2024-09-10 NOTE — Addendum Note (Signed)
 Addended by: Dayton Kenley E on: 09/10/2024 11:08 AM   Modules accepted: Orders

## 2024-10-02 ENCOUNTER — Other Ambulatory Visit: Payer: Self-pay | Admitting: Psychiatry

## 2024-10-12 ENCOUNTER — Other Ambulatory Visit: Payer: Self-pay | Admitting: Psychiatry

## 2024-10-12 DIAGNOSIS — R251 Tremor, unspecified: Secondary | ICD-10-CM

## 2024-10-18 ENCOUNTER — Telehealth: Payer: Self-pay | Admitting: Psychiatry

## 2024-10-18 NOTE — Telephone Encounter (Signed)
 PT is requesting RF on methylphenidate  er 36mg  . [facility] to [facility]: [id number] [place], [place] - [address] AT [address]

## 2024-10-19 ENCOUNTER — Other Ambulatory Visit: Payer: Self-pay

## 2024-10-19 DIAGNOSIS — F900 Attention-deficit hyperactivity disorder, predominantly inattentive type: Secondary | ICD-10-CM

## 2024-10-19 MED ORDER — METHYLPHENIDATE HCL ER (OSM) 36 MG PO TBCR: 72.0000 mg | EXTENDED_RELEASE_TABLET | Freq: Every day | ORAL | 0 refills | Status: AC

## 2024-10-19 NOTE — Telephone Encounter (Signed)
 Pended 30-day supply due to appt this month.

## 2024-10-22 ENCOUNTER — Ambulatory Visit: Admitting: Psychiatry

## 2024-10-22 DIAGNOSIS — F3340 Major depressive disorder, recurrent, in remission, unspecified: Secondary | ICD-10-CM | POA: Diagnosis not present

## 2024-10-22 DIAGNOSIS — F4010 Social phobia, unspecified: Secondary | ICD-10-CM | POA: Diagnosis not present

## 2024-10-22 DIAGNOSIS — F422 Mixed obsessional thoughts and acts: Secondary | ICD-10-CM

## 2024-10-22 NOTE — Progress Notes (Unsigned)
 Psychotherapy Progress Note [facility] [name], PhD LP  Patient ID: Ashlee Chavez)    MRN: [id number] Therapy format: {Therapy Types:21967::Individual psychotherapy} Date: 10/22/2024      Start: ***:***     Stop: ***:***     Time Spent: *** min Location: {SvcLoc:22530::In-person}   Session narrative (presenting needs, interim history, self-report of stressors and symptoms, applications of prior therapy, status changes, and interventions made in session) Suffering maximum out-of-place-ness in the thick of boxing up things and moving.  Main furniture move is12/23.  C/o feeling lost a lot in the new place.  Validated feeling disrupted in her security space, her navigation   Advised to segregate some time to just look at her place and what's in it, just see the room without having to do anything just yet.  Also to post a picture of the function of each room, to draw her senses forward to fitting the new place.  Offered playful ideas for internet images she/they could put up.    Did work through film/video editor about finances with [employer].  Affirmed how he's very clearly it's OUR money, in a 200% way that still takes some getting used to.  No joint checking yet, and all major expenses come out of his funds so far.  Offered it may help with the symbolism if she could select a few low-priced items to buy outright for the new place to feel more involved.  Possible house blessing.  Has not yet delved into cooking, that will keep.    Re OCD, no more wagers with [name], no focal OCD challenges, just the challenge to feel safe and at home in the new place.  Re. Support from [place]  Back to decorating decisions, validated her need to see things centered, clean lines, as helpful to mood, tasteful, not OCD.  Re area rugs and other items she may need to shop for, advised perusing many and seeing what speaks to her, if she want to do any of it together by all means ask,  and if it get too saturated with what might be wrong then pick out some clear wrongs (orange, zebras, 6 inch shag...).     Therapeutic modalities: {AM:23362::Cognitive Behavioral Therapy,Solution-Oriented/Positive Psychology}  Mental Status/Observations:  Appearance:   {PSY:22683}     Behavior:  {PSY:21022743}  Motor:  {PSY:22302}  Speech/Language:   {PSY:22685}  Affect:  {PSY:22687}  Mood:  {PSY:31886}  Thought process:  {PSY:31888}  Thought content:    {[phone]}  Sensory/Perceptual disturbances:    {[phone]}  Orientation:  {Psych Orientation:23301::Fully oriented}  Attention:  {Good-Fair-Poor ratings:23770::Good}    Concentration:  {Good-Fair-Poor ratings:23770::Good}  Memory:  {PSY:[phone]}  Insight:    {Good-Fair-Poor ratings:23770::Good}  Judgment:   {Good-Fair-Poor ratings:23770::Good}  Impulse Control:  {Good-Fair-Poor ratings:23770::Good}   Risk Assessment: Danger to Self: {Risk:22599::No} Self-injurious Behavior: {Risk:22599::No} Danger to Others: {Risk:22599::No} Physical Aggression / Violence: {Risk:22599::No} Duty to Warn: {AMYesNo:22526::No} Access to Firearms a concern: {AMYesNo:22526::No}  Assessment of progress:  {Progress:22147::progressing}  Diagnosis: No diagnosis found. Plan:  *** Other recommendations/advice -- As may be noted above.  Continue to utilize previously learned skills ad lib. Medication compliance -- Maintain medication as prescribed and work faithfully with relevant prescriber(s) if any changes are desired or seem indicated. Crisis service -- Aware of call list and work-in appts.  Call the clinic on-call service, 988/hotline, 911, or present to [facility] or ER if any life-threatening psychiatric crisis. Followup -- No follow-ups on file.  Next scheduled visit with me Visit  date not found.  Next scheduled in this office 11/01/2024.  Lamar Kendall, PhD [name], PhD LP Clinical Psychologist,  [facility] [address] [place], [place] [address] [phone]

## 2024-11-01 ENCOUNTER — Encounter: Payer: Self-pay | Admitting: Psychiatry

## 2024-11-01 ENCOUNTER — Ambulatory Visit: Admitting: Psychiatry

## 2024-11-01 DIAGNOSIS — R251 Tremor, unspecified: Secondary | ICD-10-CM | POA: Diagnosis not present

## 2024-11-01 DIAGNOSIS — F422 Mixed obsessional thoughts and acts: Secondary | ICD-10-CM

## 2024-11-01 DIAGNOSIS — F3340 Major depressive disorder, recurrent, in remission, unspecified: Secondary | ICD-10-CM

## 2024-11-01 DIAGNOSIS — F900 Attention-deficit hyperactivity disorder, predominantly inattentive type: Secondary | ICD-10-CM | POA: Diagnosis not present

## 2024-11-01 DIAGNOSIS — F5102 Adjustment insomnia: Secondary | ICD-10-CM | POA: Diagnosis not present

## 2024-11-01 DIAGNOSIS — F4010 Social phobia, unspecified: Secondary | ICD-10-CM

## 2024-11-01 DIAGNOSIS — G43009 Migraine without aura, not intractable, without status migrainosus: Secondary | ICD-10-CM

## 2024-11-01 NOTE — Progress Notes (Signed)
 Ashlee Chavez [id number] [birth year] 63 y.o.  Subjective:   Patient ID:  Ashlee Chavez is a 63 y.o. (DOB [birth year]) female.  Chief Complaint:  Chief Complaint  Patient presents with   Follow-up   Depression   Anxiety    Ashlee Chavez presents today for follow-up of recently worse depression DT relationship loss.    When seen February 10, 2019.  She had received some benefit from Ritalin  20 mg 3 times daily but was having issues with the crash.  We switched to Concerta  72 mg every morning.  September 14, 2019.  She was having persistent bothersome OCD and was interested in further medicine changes to try to help with these symptoms because she has aggressively pursued therapy and still has significant symptoms.  She has been on multiple SSRIs and so we initiated augmentation with risperidone  half milligram daily to increase to 1 mg daily. Sig improvement with risperidone .  SE low appetite with it.  Not hungry for dinner. Sleep so much better also.  Improvement is marked.  Think better bc less obsessing time.  Not constant now.  Noticed benefit within the first week.  Less obsessing lessened depression.  But residual symptoms.  visit October 14, 2019.  She was encouraged to experiment with the dosing of the risperidone  up to 2 mg or 3 mg nightly to see if she could get additional reduction in her chronic anxiety and OCD.   seen December 15, 2019.  Settled at 2 mg risperidone  bc increased sleep.  Sleep is so much better.  Harder to get up in the morning.   Found out insurance won't cover Concerta  anymore.  Not as good on Ritalin  TID.  Not as even a response.  Terrible insurance. Therefore the only med change with switching to Focalin  to see if it would be covered better.  Appt 03/09/20, switch to Concerta  72 mg is better with better mood and motivation vs Focalin .  Focus is also better.  Very poor function with Focalin  and better with Concerta .   Better overall with more even response to  stimulant and better appetite.  No SE.  Duration all day.  Less desperate hopeless feelings on the stimulant.  Stressful times. With increase risperidone  a little better mood bump.  SE a little shakiness noticed in jaw.   Not unusual levels of anxiety.  Lost her best friend 1 year ago and still grieving.  Not much family. Alone.  Not close to B and S who live elsewhere.  Talks to God daily.  Gets to work daily.  Function is good there.  Reads and calls people.  OCD no worse with Covid. Did join a Bible study which was a huge step. Plan start lamotrigine    04/04/20 appt with the following noted: Appt moved up.  She thought she was supposed to stop risperidone  when started lamotrigine .  Started having SI off the risperidone  but wasn't sure if it was SE.  No risperidone  3 mg for 3 weeks.Anxiety shaking sweating crying all started right away.  Also trouble with sleep and can't eat.  Plan:Restart risperidone  1 mg now and another milligram in 2 to 3 hours if not feeling sufficient relief to get the full dose of 3 mg today.  Then resume 3 mg risperidone  each evening. Restart lamotrigine  25 mg daily for 2 weeks and follow the previous instructions on increasing it.  05/11/2020 appointment the following is noted: Marked improvement back on risperidone  3 mg.  SE shakes jaw.  Doesn't affect typing. Started lamotrigine  and up to 100 mg for 2 weeks.  Feels it's helped depression. Better mood and able to laugh.  Able to get pizza for Bible study and not cry. Apptetite is not great.  Working on it. Anxiety is better not gone. Plan: no med changes  07/26/20 appt with the following noted; Increased lamotrigine  gradually to 200 mg daily over the phone 6 weeks ago.  No improvement really for depression. With it. CC depression.   Risperidone  still helping anxiety. Plan: Increase lamotrigine  to 300 mg daily..  09/14/2020 appointment with following noted: Started taper of lamotrigine  DT NR. Started latuda  20 mg  daily for 10 days.  Tolerated fine.  No effects so far.. Main problem is still depression.  Anxiety is not too bad.  No energy, motivation, sleeeping a lot.  No sig SI. In past started risperidone  for anxiety and depression.  Increase Latuda  to 40 mg daily. Disc potential DDI with risperidone  reducing effectiveness of  Latuda  so reduc risperidone  to 1.5 mg HS  10/09/2020 phone call from patient: Patient left a voicemail on Friday while we were out of office for the Holiday that she is not any better. She feels the cutting in the 1/2 of Risperidone  has really increased her anxiety. She was instructed to call back if not any better but concerned that taking away the Risperidone  will really exacerbate her anxiety.  MD response:  At the last appointment we increased Latuda  to 40 mg daily and reduced risperidone  to half tablet daily.  The goal was to use Latuda  and eliminate risperidone .  However since her anxiety has worsened with the reduction in risperidone , I agree with her and she should take the full dose of risperidone  as well as continue the Latuda  40 mg daily.  The Latuda  is intended to help depression.  11/23/2020 appointment with the following noted: Propranolol  only once daily. No better with higher Latuda .  No SE either. No change in appetite which is poor.  Still struggling with depression and OCD is a little worse with changes at work. Off lamotrigine . Plan: 5-HTP supplement 200 mg daily  Increase Latuda  to 60 mg  for 3 weeks, If no benefit and no SE increase to 80 mg daily with food.  ADD still better with Concerta  72 mg versus Focalin . Continue high dosage paroxetine  80 mg daily.  01/16/2021 appointment with following noted: Increase Latuda  to 80 mg without benefit or SE.  Anxiety and depression with poor STM.  Food getting caught in her throat.  No change in eating.  No change in weight.   Anxious thoughts mostly about work even when not there.  Anxious about work needed on the house  she can't afford.  Enjoys reading but feels guilty that she should be doing something else.  More general anxiety than OCD. 5-HTP supplement 200 mg daily Wean Latuda  over a week DT NR Reduce mirtazapine  to 30 mg HS  ADD still better with Concerta  72 mg versus Focalin . Start clomipramine  25 mg capsules 1 at night for 1 week, Then increase clomipramine  to 2 capsules at night and reduce paroxetine  to 1-1/2 tablets or 60 mg daily. Wait 1 week and then get the blood test   03/15/2021 appt noted: OK not great.  About the same.  Not much change in the last 8 weeks. SE none Lab 02/20/21 clomipramine  50 + Luvox  100 = clomip 195 + norclomip 111= total 306 Losing weight and no appetite and hard to eat. Weight  loss to 107#. A lot of anxiety and depression   Sleep less sound with less mirtazapine .  Plan: Due to the severity of symptoms,Add olanzapine  5 mg tablet 1 at night Stop mirtazapine  Reduce risperidone  to 1/2 tablet for 1 week then stop it.  Call if SI recurs.  03/30/2021 appointment with the following noted: It makes me a little tired but so far anxiety is improved.  No problems off risperidone .  Appetite is not better but did get protein bars and is eating.   Less panic.  But still occurs.  Things just started this week to be a little better.  Not huge but it's a start.  A little more energy. Sleep 10 hours but that's unchanged with olanzapine . First time I've had hope in a long time. Plan: A little better with olanzapine  5 mg HS vs risperidone  3 mg. Increase olanzapine  to 10 mg HS  Call in 2 weeks if you are not seeing progressive improvement  04/12/2021 appointment with the following noted: Better with anxiety.  Sleep better.  Daytime could nap if not busy.  Otherwise OK.  Not much change in appetite.  Up to 110# about 2.5# better and trying to get protein twice daily.   A little change in OCD.   Depression is better some and staying up later and doing more than she was.  Doing some things  after dinner which is huge.  Big change with depression.  Not retreating to bed as quickly. Plan: continue meds.  05/18/21 appt noted: Further improvement in anxiety.  I feel better but still not going out but not excessively sleeping.  Acting more normally, eating better.  Eating dinner again.  Sleep reduced to 8 hours. SE dry mouth and a little hungrier. Asks about going up on olanzapine  to further reduce anxiety. [name] and describes better from 10/10 to 4/10 for anxiety and depression down to 3/10.Ashlee Chavez  Anxiety symptoms include: Excessive Worry, Obsessive Compulsive Symptoms:   Handwashing,,.  Pt reports no sleep issues. Pt reports that appetite is good. Pt reports that energy is good and down slightly. Concentration is no change. Suicidal thoughts:  denied by patient. Plan increase olanzapine  to 15 mg HS DT partial benefit  06/22/21 appt noted: Too tired and sleepy with 15 mg olanzapine  and reduced the dose to 10 mg daily. No other med changes. Overall about the same.  Don't do anything different.  Happy that it's better than it was stays up to 930 until 6 PM.  Don't go out except Bible study and church once weekly. Goes to grocery early to avoid people. Anxiety lately worse than the depression. Dx pulsatile tinnitus Plan: cut olanzapine  back to 10 mg HS Increase clomipramine  to 75 mg HS  08/13/2021 appointment with the following noted: Anxiety is good after increase clomipramine .  Used to be desperate to get home and not now.  Was thinking of disability and now it's better.  I texted a guy on a dating site first time in 2 years.  It's a big deal.  Started exercising a little.   SE no worse with increase clomipramine . Depression is better too and manageable with brief downturns.   Sleep is pretty good. Plan no med changes  10/30/2021 appt noted: Pretty good and a little better than last time.  Still getting out a little with a guy she met on dating  site and it's going ok.  Still prefers to be home.  Still get  anxious going out. Overall best response so far with any med for anxiety.   Depression is manageable. SE is OK Staying up more appropriately late and sleep is OK. Plan : Much better with clomipramine  75 HS Also continue olanzapine  10 HS & fluvoxamine  50 Benefit Concerta  72 mg a.m. Taking propranolol  20 BID for tremor No med changes  01/29/2022 appointment with the following noted: Pretty good and probably doing better. Handled triggers with less obsessions.  Had to have bx breast today and hasn't been obsessing like she would ususally.  Better than she ever expected. Surprising. Feels the med has hit it's stride at this point. No sig caffeine.  Tremor managed. Depresion managed. Concerta  is fine without problems.  Not noticeable when it wears off. Sleep good lately. Yes increase fluvoxamine  to 100 mg HS and 50 mg AM Repeat clomipramine  level .    02/11/22 lab noted: She is now on fluvoxamine  150 mg daily and clomipramine  75 mg daily.  It is known that fluvoxamine  can affect the blood level of clomipramine  but that is not a problem in her case.  We increased the fluvoxamine  at her last appointment on January 29, 2022 to the current dosage.  Let her know there is room to increase either fluvoxamine  to 200 mg daily which can be split between morning and evening if desired or taken all at night if desired.  Or the other alternative if she prefers is to increase clomipramine  to 100 mg at night.  Either 1 of those options may help further decrease anxiety and obsessions.  However if she would prefer to keep the dosage the same that is okay as well. Lorene Macintosh, MD, DFAPA  04/30/22 appt noted: Tried increasing clomipramine  and then fluvoxamine  and it made her too sleepy. Depression managed. Still a little OCD and anxiety but pretty much managed unless triggers. Getting out socially and dating which causes anxiety but doing OK. Tolerating  meds ok now. Sleep 9-5.  Likes to nap but not usually. No SI in a long time. Plan: Could not tolerate fluvoxamine  100 or clomipramine  100 DT sleepiness Therefore continue fluvoxamine  50 mg twice daily and clomipramine  75 mg nightly Continue olanzapine  10 mg nightly ADD still better with Concerta  72 mg versus Focalin . 5-HTP supplement 200 mg daily No med changes today . . 10/01/2022 appointment noted: Continues same med.  Insurance won't pay for more than 1 tablet daily fluvoxamine . SE some tiredness but sleeps well. Manageable depression .  Anxiety better with meds but not gone.   Concerta  still helping. Plan: Therefore continue fluvoxamine  50 mg twice daily and clomipramine  75 mg nightly Continue olanzapine  10 mg nightly ADD still better with Concerta  72 mg versus Focalin . Propranolol  40 AM Topomax 100 BID 5-HTP supplement 200 mg daily No med changes today  04/02/23 appt noted: Meds as above, takes Xanax  0.25 mg on Sun afternoons bc anxious about work the next day.  Helps. Still happiest at home on couch but forces herself to get out and it usually works out fine.   Dep is usually ok and not like it was before at all. Concerta  ok for attention. SE tremor and nothing else.  Plan no changes  12/15/23 appt noted: Psych meds: Alprazolam  0.25 mg daily as needed, clomipramine  75 nightly, fluvoxamine  100 mg tablet, one half twice daily; Concerta  72 AM, olanzapine  10 nightly, propranolol  20 mg tablets 1-2 twice daily as needed tremor, topiramate  100 twice daily for headache, Imitrex as needed migraine 100 mg. Ok.  Very even keel.  Don't cry even when might feel like it.  Still worry about the same.  Not worse.  Fear of mistakes at work.  New owners of business.   Dep about 5/10 is about the same.  Not as interested in enjoyable activity as normal.   Might retire in June.   HA under control. Plan: reduc olanzapine  to 7.5 mg HS.  02/17/24 appt noted: Med: reduced olanzapine  5 mg HS. Others  same. Notices a little more emotional variability but still blunted more than she would like.  Nothing got worse. Will find out in May if they are going to retire her or not.   Anxiety is not sig worse.    05/13/24 appt noted:  Med: reduced olanzapine  5 mg HS. Others same. Pretty much the same.  Emo blunted.  No excitement.  Still depressed.  It's not awful.  Used to have a great sense of humor and I miss that.  She thinks it is on of the med blunting her.   Fatigue.  Sleep a lot.  No SI. Anxiety has been ok and OCD is ok.   rETIRES END OF THIS MONTH.  She has plans to stay busy.  Just got married and will get a new house.  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  Will get in Bible Study and book club and gym. Plan: wean off olanzapine  DT emo blunting  07/05/24 appt noted: Med, stopped olanzapine . Emo blunting 35% better but anxiety 50%worse.  Don't sleep as well without it.   Retired.  Relaxing .  Reading a lot.   Bible Study and book club and gym. Lost 6 # off olanzapine . Plan: Anxiety worse off olanzapine  so resume olanzapine  to 2.5 mg daily for TR anxiety  11/01/24 appt noted:  Med: OLANZAPINE  2.5 MG hs, alprazolam  0.25 mg prn, clomipramine  75, Concerta  72, topiramate  100 mb BID, propranolol  40 mg AM.  . Migraine a couple of times per month.  Imitrex works. Rare Xanax .   Propranolol  helps.  Moving tomorrow to [place] with H.  H upbeat.   No bad anxiety attacks.  ANXIETY BETTER BACK ON OLANZAPINE .  No sig SE with meds.  No longer having to nap during the day . Tolerates lower olanzapine  better and it works. Satisfied with meds. Busy in retirment and loves to read.  H still working.   Numerous failed psychiatric medication trials include Lexapro 60 mg a day, Zoloft 250, Paxil  with weight gain,  Fluvoxamine  100 Clomipramine  50,  (Could not tolerate fluvoxamine  100 or clomipramine  100 DT sleepiness Therefore continue fluvoxamine  50 mg twice daily and clomipramine  75 mg  nightly)  Wellbutrin 300 with side effects,  mirtazapine  Rexulti which helped initially,  Vraylar, Abilify between 1 and 2.5 mg and up to 15 mg with no response,  Latuda  80 failed Risperidone  Olanzapine  15 sedation.  Lamotrigine  300 NR buspirone 30 mg,  Cytomel,  lithium 625,  pramipexole with insomnia,   Lunesta with crying spells,  topiramate , Deplin, propranolol ,   Ritalin , Focalin  NR, Concerta  72 helped No history of Prozac, Viibryd.  Review of Systems:  Review of Systems  Constitutional:  Negative for appetite change, diaphoresis and unexpected weight change.  HENT:  Negative for tinnitus.   Cardiovascular:  Negative for palpitations.  Neurological:  Positive for tremors and headaches. Negative for dizziness and light-headedness.       Mouth  Psychiatric/Behavioral:  Negative for agitation, behavioral problems, confusion, decreased concentration, hallucinations, self-injury, sleep disturbance and suicidal  ideas. The patient is nervous/anxious. The patient is not hyperactive.     Medications: I have reviewed the patient's current medications.  Current Outpatient Medications  Medication Sig Dispense Refill   ALPRAZolam  (XANAX ) 0.25 MG tablet Take 1 tablet (0.25 mg total) by mouth daily as needed for anxiety. 30 tablet 0   Ascorbic Acid (VITAMIN C) 500 MG CHEW 1 tablet Orally Once a day     atorvastatin  (LIPITOR) 20 MG tablet Take 1 tablet (20 mg total) by mouth daily. 90 tablet 3   calcium  carbonate (SUPER CALCIUM ) 1500 (600 Ca) MG TABS tablet 1 tablet with meals Orally Twice a day for 30 day(s)     Calcium  Carbonate-Vitamin D  (OSCAL 500/200 D-3 PO) Take by mouth.     Cholecalciferol (VITAMIN D  PO) Take by mouth.     Cholecalciferol (VITAMIN D3) 25 MCG (1000 UT) CAPS 1 capsule Orally Once a day for 30 day(s)     clomiPRAMINE  (ANAFRANIL ) 75 MG capsule Take 1 capsule (75 mg total) by mouth at bedtime. 90 capsule 0   estradiol  (ESTRACE ) 0.01 % CREA vaginal cream Place 1 g  vaginally 2 (two) times a week. Initial dose: nightly x 2 weeks, then twice weekly 42.5 g 1   levothyroxine  (SYNTHROID ) 75 MCG tablet Take 1 tablet (75 mcg total) by mouth daily. 90 tablet 3   methylphenidate  36 MG PO CR tablet Take 2 tablets (72 mg total) by mouth daily. 180 tablet 0   metoCLOPramide (REGLAN) 10 MG tablet Take by mouth. (Patient taking differently: Take 10 mg by mouth every 8 (eight) hours as needed (for migraine).)     OLANZapine  (ZYPREXA ) 2.5 MG tablet Take 2.5 mg by mouth at bedtime.     propranolol  (INDERAL ) 20 MG tablet TAKE 1-2 TABLETS BY MOUTH TWICE A DAY AS NEEDED FOR TREMORS 360 tablet 0   SUMAtriptan (IMITREX) 100 MG tablet Take 100 mg by mouth daily as needed for migraine.     topiramate  (TOPAMAX ) 100 MG tablet Take 1 tablet (100 mg total) by mouth 2 (two) times daily. 180 tablet 1   tretinoin (RETIN-A) 0.05 % cream Apply topically.     No current facility-administered medications for this visit.    Medication Side Effects:  Watch jaw tremor  Allergies:  Allergies  Allergen Reactions   Ampicillin Rash    Past Medical History:  Diagnosis Date   Gum lesion    Biopsy done-benign   Hypercholesteremia    Hypothyroid    LGSIL (low grade squamous intraepithelial dysplasia) 2010   Migraines    OCD (obsessive compulsive disorder)    Osteoporosis 01/2016   T score -2.7    Family History  Problem Relation Age of Onset   Hypertension Mother    Heart disease Mother    Hypertension Father    Breast cancer Paternal Aunt        Age 81   Diabetes Neg Hx     Social History   Socioeconomic History   Marital status: Married    Spouse name: Not on file   Number of children: Not on file   Years of education: Not on file   Highest education level: Not on file  Occupational History   Not on file  Tobacco Use   Smoking status: Never   Smokeless tobacco: Never  Vaping Use   Vaping status: Never Used  Substance and Sexual Activity   Alcohol use: Not  Currently    Comment: wine occassionally   Drug use:  No   Sexual activity: Yes    Partners: Male    Birth control/protection: Post-menopausal    Comment: 1st intercourse 1 yo-5 partners  Other Topics Concern   Not on file  Social History Narrative   Not on file   Social Drivers of Health   Tobacco Use: Low Risk (11/01/2024)   Patient History    Smoking Tobacco Use: Never    Smokeless Tobacco Use: Never    Passive Exposure: Not on file  Financial Resource Strain: Not on file  Food Insecurity: Not on file  Transportation Needs: Not on file  Physical Activity: Not on file  Stress: Not on file  Social Connections: Not on file  Intimate Partner Violence: Not on file  Depression (PHQ2-9): Low Risk (02/25/2024)   Depression (PHQ2-9)    PHQ-2 Score: 0  Alcohol Screen: Not on file  Housing: Not on file  Utilities: Not on file  Health Literacy: Not on file    Past Medical History, Surgical history, Social history, and Family history were reviewed and updated as appropriate.   Please see review of systems for further details on the patient's review from today.   Objective:   Physical Exam:  LMP 11/19/2010   Physical Exam Constitutional:      General: She is not in acute distress.    Appearance: Normal appearance. She is well-developed.  Musculoskeletal:        General: No deformity.  Neurological:     Mental Status: She is alert and oriented to person, place, and time.     Motor: Tremor present.     Coordination: Coordination normal.     Comments: Mild mouth tremor and diminished blink rate may be more prominent.  Psychiatric:        Attention and Perception: She is attentive. She does not perceive auditory hallucinations.        Mood and Affect: Mood is anxious. Mood is not depressed. Affect is blunt. Affect is not labile, angry or inappropriate.        Speech: Speech is not delayed.        Behavior: Behavior is not slowed.        Thought Content: Thought content is  not paranoid or delusional. Thought content does not include homicidal or suicidal ideation. Thought content does not include suicidal plan.        Cognition and Memory: Cognition normal.        Judgment: Judgment normal.     Comments: Insight intact. No auditory or visual hallucinations. No delusions. Less checking. Gradually improving OCD No change in dep  Anxiety worse off olanzapine  and better back on it.     Lab Review:     Component Value Date/Time   NA 140 08/30/2024 1127   K 3.9 08/30/2024 1127   CL 109 08/30/2024 1127   CO2 26 08/30/2024 1127   GLUCOSE 90 08/30/2024 1127   BUN 12 08/30/2024 1127   CREATININE 0.74 08/30/2024 1127   CALCIUM  9.3 08/30/2024 1127   PROT 7.0 05/16/2023 0750   ALBUMIN 4.4 05/16/2023 0750   AST 18 05/16/2023 0750   ALT 19 05/16/2023 0750   ALKPHOS 51 05/16/2023 0750   BILITOT 0.3 05/16/2023 0750   GFRNONAA >60 11/20/2021 0703       Component Value Date/Time   WBC 6.9 11/20/2021 0703   RBC 4.72 11/20/2021 0703   HGB 15.4 (H) 11/20/2021 0703   HCT 46.3 (H) 11/20/2021 0703   PLT 156 11/20/2021 0703  MCV 98.1 11/20/2021 0703   MCH 32.6 11/20/2021 0703   MCHC 33.3 11/20/2021 0703   RDW 13.1 11/20/2021 0703   LYMPHSABS 1.0 11/20/2021 0703   MONOABS 0.4 11/20/2021 0703   EOSABS 0.1 11/20/2021 0703   BASOSABS 0.0 11/20/2021 0703    No results found for: POCLITH, LITHIUM   No results found for: PHENYTOIN, PHENOBARB, VALPROATE, CBMZ   .res Assessment: Plan:    Shanetra was seen today for follow-up, depression and anxiety.  Diagnoses and all orders for this visit:  Mixed obsessional thoughts and acts  Social anxiety disorder  Attention deficit hyperactivity disorder (ADHD), predominantly inattentive type  Tremor of face and hands  Major depressive disorder, recurrent, in remission  Insomnia due to psychological stress  Migraine without aura and without status migrainosus, not intractable    We discussed TRD  and anxiety . she has a long list of failed psychiatric meds.  She has not tended to do well with higher dosages of SSRIs.  OCD handwashing & checking and dep under pretty good control. Overall she is satisfied with the current med regimen  Patient has been markedly depressed and anxious but, much better Since adding olanzapine  10 mg she has seen a significant improvement in depression and anxiety. Much better with clomipramine  75 HS  Chart review revealed that prior to starting treatment here patient had taken clomipramine  in the past with some benefit but also with some side effects.  It is a tricyclic antidepressant distinctly different from SSRIs.  Some patients respond better to them.  She has a history of previous benefit.   We discussed the value of doing blood levels with tricyclic antidepressants like clomipramine .  She agrees.  Lab 02/20/21 clomipramine  50 + Luvox  100 = clomip 195 + norclomip 111= total 306 (normal 220-500) Better with Increase clomipramine  to 75 mg HS 02/11/2022 clomipramine  level 183 history people, nor clomipramine  level 89, total 272 on clomipramine  75 mg nightly and fluvoxamine  50 mg twice daily.  Disc dental risks and SE risks with meds.  HA managed  Could not tolerate fluvoxamine  100 BID or clomipramine  100 DT sleepiness Therefore continue fluvoxamine  50 mg twice daily and clomipramine  75 mg nightly ADD still better with Concerta  72 mg versus Focalin . 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 Ashlee Chavez She is continuing psychotherapy with Dr. Marijean  Discussed potential metabolic side effects associated with atypical antipsychotics, as well as potential risk for movement side effects. Advised pt to contact office if movement side effects occur.   Consider Auvelity for residual depression.  Consider  alternative to olanzapine  for TR anxiety.  Disc SE of options.  When she retires plans to read and not work [employer] about the job.  Follow-up in 4-6  mos  Lorene Macintosh MD, DFAPA  Future Appointments  Date Time Provider Department Center  11/12/2024  8:30 AM Prentiss Annabella LABOR, NP [facility] None  12/10/2024  8:00 AM Marijean [place], PhD [facility] None  02/28/2025  4:00 PM Prentiss Annabella LABOR, NP [facility] None  03/01/2025  8:20 AM Thapa, [place], MD [facility] None    No orders of the defined types were placed in this encounter.      -------------------------------

## 2024-11-06 ENCOUNTER — Other Ambulatory Visit: Payer: Self-pay | Admitting: Psychiatry

## 2024-11-06 DIAGNOSIS — G43009 Migraine without aura, not intractable, without status migrainosus: Secondary | ICD-10-CM

## 2024-11-12 ENCOUNTER — Encounter: Payer: Self-pay | Admitting: Nurse Practitioner

## 2024-11-12 ENCOUNTER — Ambulatory Visit: Admitting: Nurse Practitioner

## 2024-11-12 VITALS — BP 110/70 | HR 70

## 2024-11-12 DIAGNOSIS — N952 Postmenopausal atrophic vaginitis: Secondary | ICD-10-CM | POA: Diagnosis not present

## 2024-11-12 DIAGNOSIS — N898 Other specified noninflammatory disorders of vagina: Secondary | ICD-10-CM

## 2024-11-12 LAB — WET PREP FOR TRICH, YEAST, CLUE

## 2024-11-12 MED ORDER — ESTRADIOL 0.01 % VA CREA: 1.0000 g | TOPICAL_CREAM | VAGINAL | 1 refills | Status: AC

## 2024-11-12 NOTE — Progress Notes (Signed)
" ° °  Acute Office Visit  Subjective:    Patient ID: Ashlee Chavez, female    DOB: [birth year], 64 y.o.   MRN: [id number]   HPI 64 y.o. presents today for yellow vaginal discharge x 6 weeks. Denies itching or odor. Prescribed vaginal estrogen in October. Used up tube and did not continue. Was not aware this was a long-term medication. Discharge started after stopping estrogen.   Patient's last menstrual period was 11/19/2010.    Review of Systems  Constitutional: Negative.   Genitourinary:  Positive for vaginal discharge. Negative for vaginal pain.       Objective:    Physical Exam Exam conducted with a chaperone present.  Constitutional:      Appearance: Normal appearance.  Genitourinary:    General: Normal vulva.     Vagina: Normal. No vaginal discharge or erythema.     Cervix: Normal.     Comments: Atrophic present    BP 110/70   Pulse 70   LMP 11/19/2010   SpO2 99%  Wt Readings from Last 3 Encounters:  09/10/24 118 lb 12.8 oz (53.9 kg)  08/30/24 119 lb 3.2 oz (54.1 kg)  02/25/24 121 lb (54.9 kg)        Ashlee Chavez, CMA present as biomedical engineer.   Wet prep negative for pathogens  Assessment & Plan:   Problem List Items Addressed This Visit   None Visit Diagnoses       Vaginal discharge    -  Primary   Relevant Orders   WET PREP FOR TRICH, YEAST, CLUE     Vaginal atrophy       Relevant Medications   estradiol  (ESTRACE ) 0.01 % CREA vaginal cream (Start on 11/15/2024)      Plan: Wet prep negative. Recommend restarting vaginal estrogen.   Return if symptoms worsen or fail to improve.    Ashlee DELENA Shutter DNP, 9:00 AM 11/12/2024 "

## 2024-12-10 ENCOUNTER — Ambulatory Visit: Admitting: Psychiatry

## 2024-12-10 DIAGNOSIS — F422 Mixed obsessional thoughts and acts: Secondary | ICD-10-CM | POA: Diagnosis not present

## 2024-12-10 DIAGNOSIS — F4010 Social phobia, unspecified: Secondary | ICD-10-CM | POA: Diagnosis not present

## 2024-12-10 DIAGNOSIS — F3340 Major depressive disorder, recurrent, in remission, unspecified: Secondary | ICD-10-CM | POA: Diagnosis not present

## 2025-01-25 ENCOUNTER — Ambulatory Visit: Admitting: Psychiatry

## 2025-02-28 ENCOUNTER — Ambulatory Visit: Admitting: Endocrinology

## 2025-02-28 ENCOUNTER — Ambulatory Visit: Admitting: Nurse Practitioner

## 2025-03-01 ENCOUNTER — Ambulatory Visit: Admitting: Endocrinology

## 2025-03-01 ENCOUNTER — Ambulatory Visit: Admitting: Psychiatry

## 2025-03-02 ENCOUNTER — Ambulatory Visit: Admitting: Nurse Practitioner

## 2025-03-07 ENCOUNTER — Ambulatory Visit: Admitting: Endocrinology
